# Patient Record
Sex: Female | Born: 1940 | Race: White | Hispanic: No | Marital: Married | Smoking: Former smoker
Health system: Southern US, Community
[De-identification: ages and names within clinical notes are randomized; demographics above are authoritative.]

## PROBLEM LIST (undated history)

## (undated) DIAGNOSIS — I10 Essential (primary) hypertension: Secondary | ICD-10-CM

## (undated) DIAGNOSIS — M1990 Unspecified osteoarthritis, unspecified site: Secondary | ICD-10-CM

## (undated) DIAGNOSIS — C801 Malignant (primary) neoplasm, unspecified: Secondary | ICD-10-CM

## (undated) DIAGNOSIS — K219 Gastro-esophageal reflux disease without esophagitis: Secondary | ICD-10-CM

## (undated) DIAGNOSIS — C3491 Malignant neoplasm of unspecified part of right bronchus or lung: Secondary | ICD-10-CM

## (undated) DIAGNOSIS — Z8719 Personal history of other diseases of the digestive system: Secondary | ICD-10-CM

## (undated) DIAGNOSIS — K589 Irritable bowel syndrome without diarrhea: Secondary | ICD-10-CM

## (undated) DIAGNOSIS — E0500 Thyrotoxicosis with diffuse goiter without thyrotoxic crisis or storm: Secondary | ICD-10-CM

## (undated) DIAGNOSIS — M542 Cervicalgia: Secondary | ICD-10-CM

## (undated) DIAGNOSIS — I340 Nonrheumatic mitral (valve) insufficiency: Secondary | ICD-10-CM

## (undated) DIAGNOSIS — I480 Paroxysmal atrial fibrillation: Secondary | ICD-10-CM

## (undated) DIAGNOSIS — R112 Nausea with vomiting, unspecified: Secondary | ICD-10-CM

## (undated) DIAGNOSIS — R7303 Prediabetes: Secondary | ICD-10-CM

## (undated) DIAGNOSIS — E785 Hyperlipidemia, unspecified: Secondary | ICD-10-CM

## (undated) DIAGNOSIS — Z803 Family history of malignant neoplasm of breast: Secondary | ICD-10-CM

## (undated) DIAGNOSIS — Z9889 Other specified postprocedural states: Secondary | ICD-10-CM

## (undated) DIAGNOSIS — F419 Anxiety disorder, unspecified: Secondary | ICD-10-CM

## (undated) HISTORY — PX: MASTECTOMY: SHX3

## (undated) HISTORY — PX: APPENDECTOMY: SHX54

## (undated) HISTORY — PX: BREAST SURGERY: SHX581

## (undated) HISTORY — PX: ABDOMINAL HYSTERECTOMY: SHX81

## (undated) HISTORY — PX: COLONOSCOPY: SHX174

## (undated) HISTORY — PX: BACK SURGERY: SHX140

## (undated) HISTORY — DX: Family history of malignant neoplasm of breast: Z80.3

## (undated) HISTORY — PX: TONSILLECTOMY: SUR1361

## (undated) HISTORY — PX: BREAST BIOPSY: SHX20

## (undated) HISTORY — PX: EYE SURGERY: SHX253

## (undated) HISTORY — PX: CHOLECYSTECTOMY: SHX55

---
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
## 1997-05-01 ENCOUNTER — Other Ambulatory Visit: Admission: RE | Admit: 1997-05-01 | Discharge: 1997-05-01 | Payer: Self-pay | Admitting: Obstetrics and Gynecology

## 1997-12-10 ENCOUNTER — Ambulatory Visit (HOSPITAL_COMMUNITY): Admission: RE | Admit: 1997-12-10 | Discharge: 1997-12-10 | Payer: Self-pay | Admitting: Family Medicine

## 1997-12-10 ENCOUNTER — Encounter: Payer: Self-pay | Admitting: Family Medicine

## 1997-12-16 ENCOUNTER — Ambulatory Visit (HOSPITAL_COMMUNITY): Admission: RE | Admit: 1997-12-16 | Discharge: 1997-12-16 | Payer: Self-pay | Admitting: Family Medicine

## 1998-01-24 DIAGNOSIS — C801 Malignant (primary) neoplasm, unspecified: Secondary | ICD-10-CM

## 1998-01-24 DIAGNOSIS — C50919 Malignant neoplasm of unspecified site of unspecified female breast: Secondary | ICD-10-CM

## 1998-01-24 HISTORY — DX: Malignant (primary) neoplasm, unspecified: C80.1

## 1998-01-24 HISTORY — DX: Malignant neoplasm of unspecified site of unspecified female breast: C50.919

## 1998-06-12 ENCOUNTER — Encounter: Payer: Self-pay | Admitting: Obstetrics and Gynecology

## 1998-06-12 ENCOUNTER — Ambulatory Visit (HOSPITAL_COMMUNITY): Admission: RE | Admit: 1998-06-12 | Discharge: 1998-06-12 | Payer: Self-pay | Admitting: Obstetrics and Gynecology

## 1998-06-29 ENCOUNTER — Encounter: Payer: Self-pay | Admitting: General Surgery

## 1998-06-29 ENCOUNTER — Ambulatory Visit (HOSPITAL_COMMUNITY): Admission: RE | Admit: 1998-06-29 | Discharge: 1998-06-29 | Payer: Self-pay | Admitting: *Deleted

## 1998-07-06 ENCOUNTER — Encounter: Payer: Self-pay | Admitting: General Surgery

## 1998-07-09 ENCOUNTER — Encounter: Payer: Self-pay | Admitting: General Surgery

## 1998-07-09 ENCOUNTER — Ambulatory Visit (HOSPITAL_COMMUNITY): Admission: RE | Admit: 1998-07-09 | Discharge: 1998-07-10 | Payer: Self-pay | Admitting: General Surgery

## 1998-07-30 ENCOUNTER — Inpatient Hospital Stay (HOSPITAL_COMMUNITY): Admission: RE | Admit: 1998-07-30 | Discharge: 1998-08-02 | Payer: Self-pay | Admitting: Plastic Surgery

## 1998-08-19 ENCOUNTER — Other Ambulatory Visit: Admission: RE | Admit: 1998-08-19 | Discharge: 1998-08-19 | Payer: Self-pay | Admitting: Obstetrics and Gynecology

## 1999-05-11 ENCOUNTER — Encounter (INDEPENDENT_AMBULATORY_CARE_PROVIDER_SITE_OTHER): Payer: Self-pay

## 1999-05-11 ENCOUNTER — Ambulatory Visit (HOSPITAL_COMMUNITY): Admission: RE | Admit: 1999-05-11 | Discharge: 1999-05-11 | Payer: Self-pay | Admitting: Gastroenterology

## 1999-06-25 ENCOUNTER — Encounter: Payer: Self-pay | Admitting: Hematology and Oncology

## 1999-06-25 ENCOUNTER — Encounter: Admission: RE | Admit: 1999-06-25 | Discharge: 1999-06-25 | Payer: Self-pay | Admitting: Hematology and Oncology

## 1999-07-06 ENCOUNTER — Encounter: Admission: RE | Admit: 1999-07-06 | Discharge: 1999-07-06 | Payer: Self-pay | Admitting: Hematology and Oncology

## 1999-07-06 ENCOUNTER — Encounter: Payer: Self-pay | Admitting: Hematology and Oncology

## 1999-08-11 ENCOUNTER — Encounter: Admission: RE | Admit: 1999-08-11 | Discharge: 1999-08-11 | Payer: Self-pay | Admitting: Family Medicine

## 2000-07-04 ENCOUNTER — Encounter: Payer: Self-pay | Admitting: Hematology and Oncology

## 2000-07-04 ENCOUNTER — Ambulatory Visit (HOSPITAL_COMMUNITY): Admission: RE | Admit: 2000-07-04 | Discharge: 2000-07-04 | Payer: Self-pay | Admitting: Hematology and Oncology

## 2000-07-06 ENCOUNTER — Ambulatory Visit (HOSPITAL_COMMUNITY): Admission: RE | Admit: 2000-07-06 | Discharge: 2000-07-06 | Payer: Self-pay | Admitting: Hematology and Oncology

## 2000-07-06 ENCOUNTER — Encounter: Payer: Self-pay | Admitting: Hematology and Oncology

## 2000-12-18 ENCOUNTER — Encounter: Payer: Self-pay | Admitting: Family Medicine

## 2000-12-18 ENCOUNTER — Encounter: Admission: RE | Admit: 2000-12-18 | Discharge: 2000-12-18 | Payer: Self-pay | Admitting: Family Medicine

## 2001-07-10 ENCOUNTER — Encounter: Payer: Self-pay | Admitting: Hematology and Oncology

## 2001-07-10 ENCOUNTER — Encounter: Admission: RE | Admit: 2001-07-10 | Discharge: 2001-07-10 | Payer: Self-pay | Admitting: Hematology and Oncology

## 2002-07-15 ENCOUNTER — Encounter: Admission: RE | Admit: 2002-07-15 | Discharge: 2002-07-15 | Payer: Self-pay | Admitting: Hematology & Oncology

## 2002-07-15 ENCOUNTER — Encounter: Payer: Self-pay | Admitting: Hematology & Oncology

## 2002-08-08 ENCOUNTER — Ambulatory Visit (HOSPITAL_BASED_OUTPATIENT_CLINIC_OR_DEPARTMENT_OTHER): Admission: RE | Admit: 2002-08-08 | Discharge: 2002-08-09 | Payer: Self-pay | Admitting: Orthopedic Surgery

## 2003-03-19 ENCOUNTER — Other Ambulatory Visit: Admission: RE | Admit: 2003-03-19 | Discharge: 2003-03-19 | Payer: Self-pay | Admitting: *Deleted

## 2003-07-16 ENCOUNTER — Encounter: Admission: RE | Admit: 2003-07-16 | Discharge: 2003-07-16 | Payer: Self-pay | Admitting: Hematology & Oncology

## 2003-09-25 ENCOUNTER — Other Ambulatory Visit: Admission: RE | Admit: 2003-09-25 | Discharge: 2003-09-25 | Payer: Self-pay | Admitting: *Deleted

## 2003-11-04 ENCOUNTER — Encounter: Admission: RE | Admit: 2003-11-04 | Discharge: 2003-11-04 | Payer: Self-pay | Admitting: Family Medicine

## 2004-06-25 ENCOUNTER — Ambulatory Visit: Payer: Self-pay | Admitting: Hematology & Oncology

## 2004-07-21 ENCOUNTER — Encounter: Admission: RE | Admit: 2004-07-21 | Discharge: 2004-07-21 | Payer: Self-pay | Admitting: Hematology & Oncology

## 2004-10-12 ENCOUNTER — Other Ambulatory Visit: Admission: RE | Admit: 2004-10-12 | Discharge: 2004-10-12 | Payer: Self-pay | Admitting: *Deleted

## 2005-05-25 ENCOUNTER — Encounter: Payer: Self-pay | Admitting: Family Medicine

## 2005-07-19 ENCOUNTER — Ambulatory Visit: Payer: Self-pay | Admitting: Hematology & Oncology

## 2005-08-22 ENCOUNTER — Encounter: Admission: RE | Admit: 2005-08-22 | Discharge: 2005-08-22 | Payer: Self-pay | Admitting: Hematology & Oncology

## 2005-11-11 ENCOUNTER — Other Ambulatory Visit: Admission: RE | Admit: 2005-11-11 | Discharge: 2005-11-11 | Payer: Self-pay | Admitting: Obstetrics and Gynecology

## 2005-12-26 ENCOUNTER — Ambulatory Visit: Payer: Self-pay | Admitting: Hematology & Oncology

## 2006-02-28 ENCOUNTER — Ambulatory Visit: Payer: Self-pay | Admitting: Hematology & Oncology

## 2006-07-07 ENCOUNTER — Other Ambulatory Visit: Admission: RE | Admit: 2006-07-07 | Discharge: 2006-07-07 | Payer: Self-pay | Admitting: Obstetrics and Gynecology

## 2006-08-24 ENCOUNTER — Encounter: Admission: RE | Admit: 2006-08-24 | Discharge: 2006-08-24 | Payer: Self-pay | Admitting: Obstetrics and Gynecology

## 2007-01-17 ENCOUNTER — Other Ambulatory Visit: Admission: RE | Admit: 2007-01-17 | Discharge: 2007-01-17 | Payer: Self-pay | Admitting: Obstetrics and Gynecology

## 2007-07-13 ENCOUNTER — Other Ambulatory Visit: Admission: RE | Admit: 2007-07-13 | Discharge: 2007-07-13 | Payer: Self-pay | Admitting: Obstetrics and Gynecology

## 2007-08-27 ENCOUNTER — Encounter: Admission: RE | Admit: 2007-08-27 | Discharge: 2007-08-27 | Payer: Self-pay | Admitting: Obstetrics and Gynecology

## 2008-02-01 ENCOUNTER — Other Ambulatory Visit: Admission: RE | Admit: 2008-02-01 | Discharge: 2008-02-01 | Payer: Self-pay | Admitting: Obstetrics and Gynecology

## 2008-08-27 ENCOUNTER — Encounter: Admission: RE | Admit: 2008-08-27 | Discharge: 2008-08-27 | Payer: Self-pay | Admitting: Obstetrics and Gynecology

## 2008-08-29 ENCOUNTER — Encounter: Admission: RE | Admit: 2008-08-29 | Discharge: 2008-08-29 | Payer: Self-pay | Admitting: Obstetrics and Gynecology

## 2008-12-08 ENCOUNTER — Encounter: Admission: RE | Admit: 2008-12-08 | Discharge: 2008-12-08 | Payer: Self-pay | Admitting: Family Medicine

## 2009-02-24 ENCOUNTER — Encounter: Admission: RE | Admit: 2009-02-24 | Discharge: 2009-02-24 | Payer: Self-pay | Admitting: Obstetrics and Gynecology

## 2009-08-05 ENCOUNTER — Encounter: Admission: RE | Admit: 2009-08-05 | Discharge: 2009-08-05 | Payer: Self-pay | Admitting: Gastroenterology

## 2010-02-13 ENCOUNTER — Other Ambulatory Visit: Payer: Self-pay | Admitting: Family Medicine

## 2010-02-13 DIAGNOSIS — Z0000 Encounter for general adult medical examination without abnormal findings: Secondary | ICD-10-CM

## 2010-02-25 ENCOUNTER — Ambulatory Visit
Admission: RE | Admit: 2010-02-25 | Discharge: 2010-02-25 | Disposition: A | Discharge status: Home / Self Care | Payer: BC Managed Care – PPO | Source: Ambulatory Visit | Attending: Family Medicine | Admitting: Family Medicine

## 2010-02-25 DIAGNOSIS — Z0000 Encounter for general adult medical examination without abnormal findings: Secondary | ICD-10-CM

## 2010-06-11 NOTE — Op Note (Signed)
NAME:  Vanessa Day, Vanessa Day                         ACCOUNT NO.:  [id number]   MEDICAL RECORD NO.:  [id number]                   PATIENT TYPE:  AMB   LOCATION:  DSC                                  FACILITY:  [facility]   PHYSICIAN:  Cindee Salt, M.D.                    DATE OF BIRTH:  [birth year]   DATE OF PROCEDURE:  08/08/2002  DATE OF DISCHARGE:                                 OPERATIVE REPORT   PREOPERATIVE DIAGNOSIS:  Carpal tunnel syndrome, arthritis CMC joint, right  hand, thumb.   POSTOPERATIVE DIAGNOSIS:  Carpal tunnel syndrome, arthritis CMC, right hand,  thumb.   OPERATION:  Carpal tunnel release, suspension plasty with trapezium  excision, right thumb, right hand.   SURGEON:  Cindee Salt, M.D.   ASSISTANTCarolyne Fiscal, nurse specialist.   ANESTHESIA:  Axillary block.   HISTORY:  The patient is a 70 year old female with a history of carpal  tunnel syndrome. EMG nerve conduction positive. 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   PROCEDURE:  The patient was brought to the operating room where an axillary  block was carried out without difficulty. She was prepped and draped using  Betadine scrub and solution with the right arm free. Supine position. A  longitudinal incision was made in the palm and carried through subcutaneous.  Bleeders were electrocauterized. Palmar fascia was splint. Superficial  palmar arch identified. The flexor tendon to the ring and little finger  identified to the ulnar side of the medial nerve. The carpal retinaculum was  incised with sharp dissection, right angle and retractor placed between skin  and forearm fascia. The fascia was released for approximately 3-cm proximal  to the wrist crease under direct vision. Canal was explored. No further  lesions were identified. The wound was irrigated. The skin was closed with  interrupted 5-0 nylon sutures. Hockey-stick incision was made over the  carpal metacarpal joint with a volar  apex, carried down through subcutaneous  tissue. Bleeders were again electrocauterized. Radial nerve was identified  and protected along with its branches. Radial artery was identified, and  incision was then made along the trapezium, opening the [facility] and ________  joint. The arthritis was immediately apparent with total eburnation of bone.  Blunt sharp dissection, the trapezium was excised, morselized in piecemeal,  and removed in toto. The dorsal limb of the abductor pollicis longus was  then harvested using a 30-gauge stainless steel monofilament wire. Separate  incision was made proximally. A Bunnell tendon retriever passed through the  first dorsal compartment.  With achieved cutter effect, the dorsal tendon  was harvested. This was left attached at the base of the metacarpal of the  thumb. Drill holes were placed in the base of the thumb and index finger.  The tendon was then woven through the drill holes from the base of the thumb  through the drill hole  in the base of the index, brought dorsally, then  palmarly around the bone, and passed volar to the slip from the first and  second metacarpal, brought back onto itself and sutured into position with 4-  0 Mersilene sutures and 4-0 Mersilene into the graft as it emerged from the  base of the thumb metacarpal. This firmly fixed the thumb suspended from the  index metacarpal with the drill hole just distal the articular surface for  the thumb CMC on the index metacarpal. The wound was copiously irrigated  with saline, the capsule closed with figure-of-eight 4-0 Vicryl sutures,  subcutaneous tissue with  4-0 Vicryl, and the skin with interrupted 5-0 nylon sutures. Sterile  compressive dressing and thumb spica splint applied. The patient tolerated  the procedure well and was taken to the recovery room for observation in  satisfactory condition. She is admitted for overnight stay. She will be  discharged on Percocet and Keflex.                                                Cindee Salt, M.D.    GK/MEDQ  D:  08/08/2002  T:  08/08/2002  Job:  [id number]

## 2010-07-15 ENCOUNTER — Other Ambulatory Visit: Payer: Self-pay | Admitting: Neurosurgery

## 2010-07-15 DIAGNOSIS — M549 Dorsalgia, unspecified: Secondary | ICD-10-CM

## 2010-07-15 DIAGNOSIS — M5410 Radiculopathy, site unspecified: Secondary | ICD-10-CM

## 2010-07-17 ENCOUNTER — Ambulatory Visit
Admission: RE | Admit: 2010-07-17 | Discharge: 2010-07-17 | Disposition: A | Discharge status: Home / Self Care | Payer: BC Managed Care – PPO | Source: Ambulatory Visit | Attending: Neurosurgery | Admitting: Neurosurgery

## 2010-07-17 DIAGNOSIS — M549 Dorsalgia, unspecified: Secondary | ICD-10-CM

## 2010-07-17 DIAGNOSIS — M5410 Radiculopathy, site unspecified: Secondary | ICD-10-CM

## 2011-01-26 ENCOUNTER — Other Ambulatory Visit: Payer: Self-pay | Admitting: Obstetrics and Gynecology

## 2011-01-26 DIAGNOSIS — Z1231 Encounter for screening mammogram for malignant neoplasm of breast: Secondary | ICD-10-CM

## 2011-03-08 ENCOUNTER — Ambulatory Visit
Admission: RE | Admit: 2011-03-08 | Discharge: 2011-03-08 | Disposition: A | Discharge status: Home / Self Care | Payer: Medicare Other | Source: Ambulatory Visit | Attending: Obstetrics and Gynecology | Admitting: Obstetrics and Gynecology

## 2011-03-08 DIAGNOSIS — Z1231 Encounter for screening mammogram for malignant neoplasm of breast: Secondary | ICD-10-CM

## 2011-06-21 ENCOUNTER — Encounter (HOSPITAL_COMMUNITY): Payer: Self-pay | Admitting: Pharmacy Technician

## 2011-06-22 ENCOUNTER — Encounter (HOSPITAL_COMMUNITY): Payer: Self-pay | Admitting: *Deleted

## 2011-06-22 ENCOUNTER — Encounter (INDEPENDENT_AMBULATORY_CARE_PROVIDER_SITE_OTHER): Payer: Medicare Other | Admitting: Ophthalmology

## 2011-06-22 DIAGNOSIS — H33009 Unspecified retinal detachment with retinal break, unspecified eye: Secondary | ICD-10-CM

## 2011-06-22 DIAGNOSIS — H43819 Vitreous degeneration, unspecified eye: Secondary | ICD-10-CM

## 2011-06-22 MED ORDER — GATIFLOXACIN 0.5 % OP SOLN
1.0000 [drp] | OPHTHALMIC | Status: AC | PRN
  Administered 2011-06-23 (×3): 1 [drp] via OPHTHALMIC
  Filled 2011-06-22: qty 2.5

## 2011-06-22 MED ORDER — CEFAZOLIN SODIUM 1-5 GM-% IV SOLN
1.0000 g | INTRAVENOUS | Status: AC
  Administered 2011-06-23: 1 g via INTRAVENOUS
  Filled 2011-06-22: qty 50

## 2011-06-22 MED ORDER — PHENYLEPHRINE HCL 2.5 % OP SOLN
1.0000 [drp] | OPHTHALMIC | Status: AC | PRN
  Administered 2011-06-23 (×3): 1 [drp] via OPHTHALMIC
  Filled 2011-06-22: qty 3

## 2011-06-22 MED ORDER — CYCLOPENTOLATE HCL 1 % OP SOLN
1.0000 [drp] | OPHTHALMIC | Status: AC | PRN
  Administered 2011-06-23 (×3): 1 [drp] via OPHTHALMIC
  Filled 2011-06-22: qty 2

## 2011-06-22 MED ORDER — TROPICAMIDE 1 % OP SOLN
1.0000 [drp] | OPHTHALMIC | Status: AC | PRN
  Administered 2011-06-23 (×3): 1 [drp] via OPHTHALMIC
  Filled 2011-06-22: qty 3

## 2011-06-22 NOTE — H&P (Signed)
Vanessa Day is an 71 y.o. female.   Chief Complaint: sudden loss of vision left eye HPI: cataract surgery ten years ago.  Awoke with loss of vision left eye  No past medical history on file.  No past surgical history on file.  No family history on file. Social History:  does not have a smoking history on file. She does not have any smokeless tobacco history on file. Her alcohol and drug histories not on file.  Allergies: No Known Allergies   (Not in a hospital admission)  Review of systems otherwise negative  There were no vitals taken for this visit.  Physical exam: Mental status: oriented x3. Eyes: See eye exam associated with this date of surgery in media tab.  Scanned in by scanning center Ears, Nose, Throat: within normal limits Neck: Within Normal limits General: within normal limits Chest: Within normal limits Breast: deferred Heart: Within normal limits Abdomen: Within normal limits GU: deferred Extremities: within normal limits Skin: within normal limits  Assessment/Plan Rhegmatogenous retinal detachment left eye Plan: To [facility] for Scleral buckle repair of Rhegmatogenous retinal detachment left eye with possible laser treatment and gas injection  Oleva Koo, Beulah Gandy 06/22/2011, 8:47 AM

## 2011-06-23 ENCOUNTER — Encounter (HOSPITAL_COMMUNITY): Payer: Self-pay | Admitting: *Deleted

## 2011-06-23 ENCOUNTER — Encounter (HOSPITAL_COMMUNITY)
Admission: RE | Disposition: A | Discharge status: Home / Self Care | Payer: Self-pay | Source: Ambulatory Visit | Attending: Ophthalmology

## 2011-06-23 ENCOUNTER — Ambulatory Visit (HOSPITAL_COMMUNITY): Payer: Medicare Other

## 2011-06-23 ENCOUNTER — Ambulatory Visit (HOSPITAL_COMMUNITY)
Admission: RE | Admit: 2011-06-23 | Discharge: 2011-06-24 | Disposition: A | Discharge status: Home / Self Care | Payer: Medicare Other | Source: Ambulatory Visit | Attending: Ophthalmology | Admitting: Ophthalmology

## 2011-06-23 ENCOUNTER — Encounter (INDEPENDENT_AMBULATORY_CARE_PROVIDER_SITE_OTHER): Payer: Self-pay | Admitting: Ophthalmology

## 2011-06-23 ENCOUNTER — Encounter (HOSPITAL_COMMUNITY): Payer: Self-pay | Admitting: Anesthesiology

## 2011-06-23 ENCOUNTER — Ambulatory Visit (HOSPITAL_COMMUNITY): Payer: Medicare Other | Admitting: Anesthesiology

## 2011-06-23 DIAGNOSIS — I10 Essential (primary) hypertension: Secondary | ICD-10-CM | POA: Insufficient documentation

## 2011-06-23 DIAGNOSIS — H33009 Unspecified retinal detachment with retinal break, unspecified eye: Secondary | ICD-10-CM

## 2011-06-23 DIAGNOSIS — K219 Gastro-esophageal reflux disease without esophagitis: Secondary | ICD-10-CM | POA: Insufficient documentation

## 2011-06-23 DIAGNOSIS — E119 Type 2 diabetes mellitus without complications: Secondary | ICD-10-CM | POA: Insufficient documentation

## 2011-06-23 HISTORY — DX: Unspecified osteoarthritis, unspecified site: M19.90

## 2011-06-23 HISTORY — PX: GAS INSERTION: SHX5336

## 2011-06-23 HISTORY — DX: Gastro-esophageal reflux disease without esophagitis: K21.9

## 2011-06-23 HISTORY — DX: Essential (primary) hypertension: I10

## 2011-06-23 HISTORY — PX: SCLERAL BUCKLE: SHX5340

## 2011-06-23 HISTORY — DX: Other specified postprocedural states: Z98.890

## 2011-06-23 HISTORY — DX: Personal history of other diseases of the digestive system: Z87.19

## 2011-06-23 HISTORY — DX: Nausea with vomiting, unspecified: R11.2

## 2011-06-23 LAB — CBC
HCT: 42.2 % (ref 36.0–46.0)
MCHC: 33.9 g/dL (ref 30.0–36.0)
MCV: 93 fL (ref 78.0–100.0)
Platelets: 281 10*3/uL (ref 150–400)
RBC: 4.54 MIL/uL (ref 3.87–5.11)
RDW: 13.2 % (ref 11.5–15.5)
WBC: 8.5 10*3/uL (ref 4.0–10.5)

## 2011-06-23 LAB — BASIC METABOLIC PANEL
CO2: 30 mEq/L (ref 19–32)
Chloride: 103 mEq/L (ref 96–112)
Creatinine, Ser: 0.88 mg/dL (ref 0.50–1.10)
Glucose, Bld: 115 mg/dL — ABNORMAL HIGH (ref 70–99)
Sodium: 141 mEq/L (ref 135–145)

## 2011-06-23 LAB — GLUCOSE, CAPILLARY
Glucose-Capillary: 106 mg/dL — ABNORMAL HIGH (ref 70–99)
Glucose-Capillary: 160 mg/dL — ABNORMAL HIGH (ref 70–99)

## 2011-06-23 LAB — SURGICAL PCR SCREEN
MRSA, PCR: NEGATIVE
Staphylococcus aureus: NEGATIVE

## 2011-06-23 SURGERY — SCLERAL BUCKLE
Anesthesia: General | Site: Eye | Laterality: Left | Wound class: Clean

## 2011-06-23 MED ORDER — HYDROMORPHONE HCL PF 1 MG/ML IJ SOLN: 0.2500 mg | INTRAMUSCULAR | Status: DC | PRN

## 2011-06-23 MED ORDER — BUPIVACAINE HCL 0.75 % IJ SOLN
INTRAMUSCULAR | Status: DC | PRN
  Administered 2011-06-23: 10 mL

## 2011-06-23 MED ORDER — PROPOFOL 10 MG/ML IV EMUL
INTRAVENOUS | Status: DC | PRN
  Administered 2011-06-23: 150 mg via INTRAVENOUS

## 2011-06-23 MED ORDER — PREDNISOLONE ACETATE 1 % OP SUSP
1.0000 [drp] | Freq: Four times a day (QID) | OPHTHALMIC | Status: DC
  Filled 2011-06-23: qty 1

## 2011-06-23 MED ORDER — ONDANSETRON HCL 4 MG/2ML IJ SOLN
INTRAMUSCULAR | Status: DC | PRN
  Administered 2011-06-23: 4 mg via INTRAVENOUS

## 2011-06-23 MED ORDER — ONDANSETRON HCL 4 MG/2ML IJ SOLN: 4.0000 mg | Freq: Four times a day (QID) | INTRAMUSCULAR | Status: DC | PRN

## 2011-06-23 MED ORDER — OXYCODONE-ACETAMINOPHEN 5-325 MG PO TABS
1.0000 | ORAL_TABLET | ORAL | Status: DC | PRN
  Administered 2011-06-23 – 2011-06-24 (×2): 1 via ORAL
  Filled 2011-06-23: qty 1
  Filled 2011-06-23: qty 2

## 2011-06-23 MED ORDER — ACETAZOLAMIDE SODIUM 500 MG IJ SOLR
500.0000 mg | Freq: Once | INTRAMUSCULAR | Status: AC
  Administered 2011-06-24: 500 mg via INTRAVENOUS
  Filled 2011-06-23: qty 500

## 2011-06-23 MED ORDER — TEMAZEPAM 15 MG PO CAPS
15.0000 mg | ORAL_CAPSULE | Freq: Every evening | ORAL | Status: DC | PRN
  Administered 2011-06-23: 15 mg via ORAL
  Filled 2011-06-23: qty 1

## 2011-06-23 MED ORDER — SODIUM CHLORIDE 0.9 % IJ SOLN
INTRAMUSCULAR | Status: DC | PRN
  Administered 2011-06-23: 15:00:00

## 2011-06-23 MED ORDER — MAGNESIUM HYDROXIDE 400 MG/5ML PO SUSP: 15.0000 mL | Freq: Four times a day (QID) | ORAL | Status: DC | PRN

## 2011-06-23 MED ORDER — SODIUM CHLORIDE 0.45 % IV SOLN
INTRAVENOUS | Status: DC
  Administered 2011-06-23: 22:00:00 via INTRAVENOUS

## 2011-06-23 MED ORDER — GLYCOPYRROLATE 0.2 MG/ML IJ SOLN
INTRAMUSCULAR | Status: DC | PRN
  Administered 2011-06-23: .7 mg via INTRAVENOUS

## 2011-06-23 MED ORDER — GATIFLOXACIN 0.5 % OP SOLN
1.0000 [drp] | Freq: Four times a day (QID) | OPHTHALMIC | Status: DC
  Filled 2011-06-23: qty 2.5

## 2011-06-23 MED ORDER — SODIUM CHLORIDE 0.9 % IV SOLN
INTRAVENOUS | Status: DC
  Administered 2011-06-23: 14:00:00 via INTRAVENOUS

## 2011-06-23 MED ORDER — BACITRACIN-POLYMYXIN B 500-10000 UNIT/GM OP OINT
TOPICAL_OINTMENT | OPHTHALMIC | Status: DC | PRN
  Administered 2011-06-23: 1 via OPHTHALMIC

## 2011-06-23 MED ORDER — METOCLOPRAMIDE HCL 5 MG/ML IJ SOLN
10.0000 mg | Freq: Once | INTRAMUSCULAR | Status: AC | PRN
  Administered 2011-06-23: 10 mg via INTRAVENOUS
  Filled 2011-06-23: qty 2

## 2011-06-23 MED ORDER — LATANOPROST 0.005 % OP SOLN
1.0000 [drp] | Freq: Every day | OPHTHALMIC | Status: DC
  Filled 2011-06-23: qty 2.5

## 2011-06-23 MED ORDER — DEXTROSE 5 % IV SOLN
INTRAVENOUS | Status: DC | PRN
  Administered 2011-06-23: 15:00:00 via INTRAVENOUS

## 2011-06-23 MED ORDER — MORPHINE SULFATE 2 MG/ML IJ SOLN
1.0000 mg | INTRAMUSCULAR | Status: DC | PRN
  Administered 2011-06-23: 2 mg via INTRAVENOUS
  Filled 2011-06-23 (×2): qty 1

## 2011-06-23 MED ORDER — TETRACAINE HCL 0.5 % OP SOLN
2.0000 [drp] | Freq: Once | OPHTHALMIC | Status: DC
  Filled 2011-06-23: qty 2

## 2011-06-23 MED ORDER — BRIMONIDINE TARTRATE 0.2 % OP SOLN
1.0000 [drp] | Freq: Two times a day (BID) | OPHTHALMIC | Status: DC
  Filled 2011-06-23: qty 5

## 2011-06-23 MED ORDER — NEOSTIGMINE METHYLSULFATE 1 MG/ML IJ SOLN
INTRAMUSCULAR | Status: DC | PRN
  Administered 2011-06-23: 5 mg via INTRAVENOUS

## 2011-06-23 MED ORDER — DEXAMETHASONE SODIUM PHOSPHATE 10 MG/ML IJ SOLN
INTRAMUSCULAR | Status: DC | PRN
  Administered 2011-06-23: 10 mg

## 2011-06-23 MED ORDER — FENTANYL CITRATE 0.05 MG/ML IJ SOLN
INTRAMUSCULAR | Status: DC | PRN
  Administered 2011-06-23: 100 ug via INTRAVENOUS
  Administered 2011-06-23: 25 ug via INTRAVENOUS
  Administered 2011-06-23 (×3): 50 ug via INTRAVENOUS

## 2011-06-23 MED ORDER — BACITRACIN-POLYMYXIN B 500-10000 UNIT/GM OP OINT
1.0000 "application " | TOPICAL_OINTMENT | Freq: Four times a day (QID) | OPHTHALMIC | Status: DC
  Filled 2011-06-23: qty 3.5

## 2011-06-23 MED ORDER — MIDAZOLAM HCL 5 MG/5ML IJ SOLN
INTRAMUSCULAR | Status: DC | PRN
  Administered 2011-06-23: 2 mg via INTRAVENOUS

## 2011-06-23 MED ORDER — ACETAMINOPHEN 325 MG PO TABS: 325.0000 mg | ORAL_TABLET | ORAL | Status: DC | PRN

## 2011-06-23 MED ORDER — ROCURONIUM BROMIDE 100 MG/10ML IV SOLN
INTRAVENOUS | Status: DC | PRN
  Administered 2011-06-23: 50 mg via INTRAVENOUS
  Administered 2011-06-23: 10 mg via INTRAVENOUS
  Administered 2011-06-23: 5 mg via INTRAVENOUS

## 2011-06-23 MED ORDER — MUPIROCIN 2 % EX OINT
TOPICAL_OINTMENT | Freq: Once | CUTANEOUS | Status: DC
  Filled 2011-06-23: qty 22

## 2011-06-23 MED ORDER — SODIUM CHLORIDE 0.9 % IV SOLN
INTRAVENOUS | Status: DC | PRN
  Administered 2011-06-23 (×2): via INTRAVENOUS

## 2011-06-23 MED ORDER — HEMOSTATIC AGENTS (NO CHARGE) OPTIME
TOPICAL | Status: DC | PRN
  Administered 2011-06-23: 1 via TOPICAL

## 2011-06-23 MED ORDER — MUPIROCIN 2 % EX OINT
TOPICAL_OINTMENT | CUTANEOUS | Status: AC
  Filled 2011-06-23: qty 22

## 2011-06-23 SURGICAL SUPPLY — 66 items
APPLICATOR DR MATTHEWS STRL (MISCELLANEOUS) ×22 IMPLANT
BAND SCLERAL BUCKLING TYPE 240 (Ophthalmic Related) ×2 IMPLANT
BLADE EYE CATARACT 19 1.4 BEAV (BLADE) ×2 IMPLANT
BLADE MVR KNIFE 19G (BLADE) IMPLANT
BLADE SURG 15 STRL LF DISP TIS (BLADE) IMPLANT
BLADE SURG 15 STRL SS (BLADE)
CANNULA ANT CHAM MAIN (OPHTHALMIC RELATED) IMPLANT
CANNULA DUAL BORE 23G (CANNULA) IMPLANT
CORDS BIPOLAR (ELECTRODE) IMPLANT
COTTONBALL LRG STERILE PKG (GAUZE/BANDAGES/DRESSINGS) ×6 IMPLANT
COVER SURGICAL LIGHT HANDLE (MISCELLANEOUS) ×2 IMPLANT
DRAPE OPHTHALMIC 77X100 STRL (CUSTOM PROCEDURE TRAY) ×2 IMPLANT
ERASER HMR WETFIELD 23G BP (MISCELLANEOUS) IMPLANT
FILTER BLUE MILLIPORE (MISCELLANEOUS) ×4 IMPLANT
FILTER STRAW FLUID ASPIR (MISCELLANEOUS) IMPLANT
GAS OPHTHALMIC (MISCELLANEOUS) ×2 IMPLANT
GLOVE SS BIOGEL STRL SZ 6.5 (GLOVE) ×2 IMPLANT
GLOVE SS BIOGEL STRL SZ 7 (GLOVE) ×1 IMPLANT
GLOVE SUPERSENSE BIOGEL SZ 6.5 (GLOVE) ×2
GLOVE SUPERSENSE BIOGEL SZ 7 (GLOVE) ×1
GLOVE SURG 8.5 LATEX PF (GLOVE) ×4 IMPLANT
GLOVE SURG SS PI 6.5 STRL IVOR (GLOVE) ×2 IMPLANT
GOWN STRL NON-REIN LRG LVL3 (GOWN DISPOSABLE) IMPLANT
ILLUMINATOR CHOW PICK 25GA (MISCELLANEOUS) IMPLANT
KIT BASIN OR (CUSTOM PROCEDURE TRAY) ×2 IMPLANT
KIT PERFLUORON PROCEDURE 5ML (MISCELLANEOUS) IMPLANT
KIT ROOM TURNOVER OR (KITS) ×2 IMPLANT
KNIFE CRESCENT 1.75 EDGEAHEAD (BLADE) IMPLANT
KNIFE GRIESHABER SHARP 2.5MM (MISCELLANEOUS) ×8 IMPLANT
MARKER SKIN DUAL TIP RULER LAB (MISCELLANEOUS) IMPLANT
NEEDLE 18GX1X1/2 (RX/OR ONLY) (NEEDLE) ×2 IMPLANT
NEEDLE 25GX 5/8IN NON SAFETY (NEEDLE) IMPLANT
NEEDLE 27GAX1X1/2 (NEEDLE) IMPLANT
NEEDLE HYPO 30X.5 LL (NEEDLE) ×6 IMPLANT
NS IRRIG 1000ML POUR BTL (IV SOLUTION) ×2 IMPLANT
PACK VITRECTOMY CUSTOM (CUSTOM PROCEDURE TRAY) ×2 IMPLANT
PAD ARMBOARD 7.5X6 YLW CONV (MISCELLANEOUS) ×4 IMPLANT
PAD EYE OVAL STERILE LF (GAUZE/BANDAGES/DRESSINGS) IMPLANT
PAK VITRECTOMY PIK 25 GA (OPHTHALMIC RELATED) IMPLANT
PROBE DIRECTIONAL LASER (MISCELLANEOUS) IMPLANT
REPL STRA BRUSH NEEDLE (NEEDLE) IMPLANT
RESERVOIR BACK FLUSH (MISCELLANEOUS) IMPLANT
ROLLS DENTAL (MISCELLANEOUS) ×4 IMPLANT
SET FLUID INJECTOR (SET/KITS/TRAYS/PACK) IMPLANT
SET VGFI TUBING 8065808002 (SET/KITS/TRAYS/PACK) IMPLANT
SPONGE GROOVED SILICONE 4X12X8 (Ophthalmic Related) ×2 IMPLANT
SPONGE SURGIFOAM ABS GEL 12-7 (HEMOSTASIS) ×2 IMPLANT
STOPCOCK 4 WAY LG BORE MALE ST (IV SETS) IMPLANT
SUT CHROMIC 7 0 TG140 8 (SUTURE) ×2 IMPLANT
SUT ETHILON 9 0 TG140 8 (SUTURE) IMPLANT
SUT MERSILENE 4 0 RV 2 (SUTURE) ×4 IMPLANT
SUT SILK 2 0 (SUTURE) ×1
SUT SILK 2-0 18XBRD TIE 12 (SUTURE) ×1 IMPLANT
SUT SILK 4 0 RB 1 (SUTURE) ×2 IMPLANT
SUT VICRYL 7 0 TG140 8 (SUTURE) IMPLANT
SYR 20CC LL (SYRINGE) ×2 IMPLANT
SYR 5ML LL (SYRINGE) IMPLANT
SYR BULB 3OZ (MISCELLANEOUS) ×2 IMPLANT
SYR TB 1ML LUER SLIP (SYRINGE) IMPLANT
SYRINGE 10CC LL (SYRINGE) ×2 IMPLANT
TIRE 11 SCLERAL TYPE 279 (Ophthalmic Related) ×2 IMPLANT
TOWEL OR 17X24 6PK STRL BLUE (TOWEL DISPOSABLE) ×6 IMPLANT
TUBING ART PRESS 12 MALE/MALE (MISCELLANEOUS) IMPLANT
VITREORETINAL VISCODISSEC (MISCELLANEOUS) IMPLANT
WATER STERILE IRR 1000ML POUR (IV SOLUTION) ×2 IMPLANT
WIPE INSTRUMENT VISIWIPE 73X73 (MISCELLANEOUS) ×2 IMPLANT

## 2011-06-23 NOTE — Progress Notes (Signed)
Patient states she had EKG done at Dr. Lupita Raider office- office to fax to [place].

## 2011-06-23 NOTE — Procedures (Signed)
Brief Operative note   Preoperative diagnosis:  Pre-Op Diagnosis Codes:    * Retinal detachment with retinal defect, unspecified [361.00] Postoperative diagnosis  Post-Op Diagnosis Codes:    * Retinal detachment with retinal defect, unspecified [361.00]  Procedures: Scleral buckle, laser and gas injection  Surgeon:  Sherrie George, MD  Assistant:  Rosalie Doctor SA    Anesthesia: General  Specimen: none  Estimated blood loss:  1cc  Complications: none  Patient sent to PACU in good condition  Composed by Sherrie George MD  Dictation number: [id number]

## 2011-06-23 NOTE — Anesthesia Procedure Notes (Signed)
Procedure Name: Intubation Date/Time: 06/23/2011 2:58 PM Performed by: Keslie Gritz S Pre-anesthesia Checklist: Patient identified, Emergency Drugs available, Suction available, Patient being monitored and Timeout performed Patient Re-evaluated:Patient Re-evaluated prior to inductionOxygen Delivery Method: Circle system utilized Preoxygenation: Pre-oxygenation with 100% oxygen Intubation Type: IV induction Ventilation: Mask ventilation without difficulty Laryngoscope Size: Mac and 3 Grade View: Grade I Tube type: Oral Tube size: 7.5 mm Number of attempts: 1 Airway Equipment and Method: Stylet Placement Confirmation: ETT inserted through vocal cords under direct vision,  positive ETCO2 and breath sounds checked- equal and bilateral Secured at: 22 cm Tube secured with: Tape Dental Injury: Teeth and Oropharynx as per pre-operative assessment

## 2011-06-23 NOTE — Progress Notes (Signed)
This encounter was created in error - please disregard.

## 2011-06-23 NOTE — H&P (Signed)
I examined the patient today and there is no change in the medical status 

## 2011-06-23 NOTE — Addendum Note (Signed)
Addendum  created 06/23/11 1818 by Judie Petit, MD   Modules edited:Orders

## 2011-06-23 NOTE — Op Note (Signed)
NAMEJAYDI, BRAY               ACCOUNT NO.:  [id number]  MEDICAL RECORD NO.:  [id number]  LOCATION:  5148                         FACILITY:  [facility]  PHYSICIAN:  Beulah Gandy. Ashley Royalty, M.D. DATE OF BIRTH:  12/08/1940  DATE OF PROCEDURE:  06/23/2011 DATE OF DISCHARGE:                              OPERATIVE REPORT   ADMISSION DIAGNOSIS:  Rhegmatogenous retinal detachment in the left eye.  PROCEDURES: 1. Scleral buckle, left eye. 2. Retinal photocoagulation, left eye. 3. Gas injection, left eye.  SURGEON:  Beulah Gandy. Ashley Royalty, MD  ASSISTANT:  Rosalie Doctor, SA  ANESTHESIA:  General.  DETAILS:  Usual prep and drape, 360-degree limbal peritomy, isolation of 4 rectus muscles on 2-0 silk, scleral dissection for 360 degrees to admit a 279 intrascleral implant, diathermy placed in the bed.  The 279 implant was placed around the globe with the joint at 8:30 o'clock.  The 240 band was placed around the globe with a 270 sleeve at 8 o'clock. Perforation site was chosen at 2:30 o'clock.  A large amount of clear colorless subretinal fluid came forth in a controlled slow manner.  SF6 100% 0.4 mL was injected into the vitreous cavity as the fluid egressed. Indirect ophthalmoscopy showed the retina to be lying nicely on the scleral buckle.  Radial element was placed.  508G radial element was placed beneath the retinal break at 12:30 o'clock.  The buckle was adjusted and trimmed.  The band was adjusted and trimmed.  The scleral sutures were knotted and the free ends removed.  The indirect ophthalmoscope laser was moved into place, 783 burns were placed around the retinal break on the scleral buckle for 360 degrees.  The power was 460 mW, 1000 microns each and 0.1 seconds each.  The conjunctiva was reposited with 7-0 chromic suture.  Paracentesis x2, obtained a closing pressure of 10 with a Barraquer tonometer.  Polymyxin and gentamicin were irrigated into tenon space.  Atropine solution was  applied. Marcaine was injected around the globe for postop pain.  Decadron 10 mg was injected to the lower subconjunctival space.  Polysporin ophthalmic ointment, a patch and shield were placed.  The patient was awakened and taken to recovery in satisfactory condition.  COMPLICATIONS:  None.  DURATION:  Two hours.  The patient was awakened and taken to recovery in satisfactory condition.     Beulah Gandy. Ashley Royalty, M.D.     JDM/MEDQ  D:  06/23/2011  T:  06/23/2011  Job:  [id number]

## 2011-06-23 NOTE — Transfer of Care (Signed)
Immediate Anesthesia Transfer of Care Note  Patient: Vanessa Day  Procedure(s) Performed: Procedure(s) (LRB): SCLERAL BUCKLE (Left) DIODE LASER APPLICATION (Left) INSERTION OF GAS (Left)  Patient Location: PACU  Anesthesia Type: General  Level of Consciousness: awake and alert   Airway & Oxygen Therapy: Patient Spontanous Breathing  Post-op Assessment: Report given to PACU RN  Post vital signs: stable  Complications: No apparent anesthesia complications

## 2011-06-23 NOTE — Anesthesia Preprocedure Evaluation (Addendum)
Anesthesia Evaluation  Patient identified by MRN, date of birth, ID band Patient awake    Reviewed: Allergy & Precautions, H&P , NPO status , Patient's Chart, lab work & pertinent test results  History of Anesthesia Complications (+) PONV  Airway Mallampati: II  Neck ROM: Full    Dental  (+) Teeth Intact and Dental Advisory Given   Pulmonary neg pulmonary ROS,  breath sounds clear to auscultation        Cardiovascular hypertension, Pt. on medications Rhythm:Regular Rate:Normal     Neuro/Psych negative neurological ROS  negative psych ROS   GI/Hepatic Neg liver ROS, hiatal hernia, GERD-  Controlled,  Endo/Other  Diabetes mellitus-, Type 2  Renal/GU negative Renal ROS     Musculoskeletal  (+) Arthritis -, Osteoarthritis,    Abdominal   Peds  Hematology negative hematology ROS (+)   Anesthesia Other Findings   Reproductive/Obstetrics                        Anesthesia Physical Anesthesia Plan  ASA: III  Anesthesia Plan: General   Post-op Pain Management:    Induction: Intravenous  Airway Management Planned: Oral ETT  Additional Equipment:   Intra-op Plan:   Post-operative Plan: Extubation in OR  Informed Consent: I have reviewed the patients History and Physical, chart, labs and discussed the procedure including the risks, benefits and alternatives for the proposed anesthesia with the patient or authorized representative who has indicated his/her understanding and acceptance.   Dental advisory given  Plan Discussed with: CRNA, Anesthesiologist and Surgeon  Anesthesia Plan Comments:        Anesthesia Quick Evaluation

## 2011-06-23 NOTE — Anesthesia Postprocedure Evaluation (Signed)
  Anesthesia Post-op Note  Patient: Vanessa Day  Procedure(s) Performed: Procedure(s) (LRB): SCLERAL BUCKLE (Left) DIODE LASER APPLICATION (Left) INSERTION OF GAS (Left)  Patient Location: PACU  Anesthesia Type: General  Level of Consciousness: awake  Airway and Oxygen Therapy: Patient Spontanous Breathing  Post-op Pain: mild  Post-op Assessment: Post-op Vital signs reviewed  Post-op Vital Signs: Reviewed  Complications: No apparent anesthesia complications

## 2011-06-23 NOTE — Preoperative (Signed)
Beta Blockers   Reason not to administer Beta Blockers:Not Applicable 

## 2011-06-24 LAB — GLUCOSE, CAPILLARY: Glucose-Capillary: 139 mg/dL — ABNORMAL HIGH (ref 70–99)

## 2011-06-24 MED ORDER — OXYCODONE-ACETAMINOPHEN 5-325 MG PO TABS: 1.0000 | ORAL_TABLET | ORAL | Status: AC | PRN

## 2011-06-24 MED ORDER — BACITRACIN-POLYMYXIN B 500-10000 UNIT/GM OP OINT: 1.0000 "application " | TOPICAL_OINTMENT | Freq: Four times a day (QID) | OPHTHALMIC | Status: AC

## 2011-06-24 MED ORDER — BRIMONIDINE TARTRATE 0.2 % OP SOLN: 1.0000 [drp] | Freq: Two times a day (BID) | OPHTHALMIC | Status: AC

## 2011-06-24 MED ORDER — PREDNISOLONE ACETATE 1 % OP SUSP: 1.0000 [drp] | Freq: Four times a day (QID) | OPHTHALMIC | Status: AC

## 2011-06-24 MED ORDER — GATIFLOXACIN 0.5 % OP SOLN: 1.0000 [drp] | Freq: Four times a day (QID) | OPHTHALMIC | Status: DC

## 2011-06-24 MED FILL — Mupirocin Oint 2%: CUTANEOUS | Qty: 22 | Status: AC

## 2011-06-24 NOTE — Progress Notes (Signed)
06/24/2011, 6:53 AM  Mental Status:  Awake, Alert, Oriented  Anterior segment: Cornea  Clear    Anterior Chamber Clear    Lens:    Cataract  Intra Ocular Pressure 26 mmHg with Tonopen  Vitreous: Clear 20%gas bubble   Retina:  Attached Good laser reaction  Impression: Excellent result Retina attached   Final Diagnosis: Active Problems:  Rhegmatogenous Retinal Detachment    Plan: start post operative eye drops.  Discharge to home.  Give post operative instructions  Sherrie George 06/24/2011, 6:53 AM

## 2011-06-24 NOTE — Discharge Summary (Signed)
Discharge summary not needed on [facility]ER patients per medical records. Discharge summary not needed on [facility] patients per medical records.

## 2011-06-24 NOTE — Progress Notes (Signed)
DC home with husband. Verbally understood DC instructions. No questions asked. 

## 2011-06-28 ENCOUNTER — Encounter (HOSPITAL_COMMUNITY): Payer: Self-pay | Admitting: Ophthalmology

## 2011-06-30 ENCOUNTER — Encounter (INDEPENDENT_AMBULATORY_CARE_PROVIDER_SITE_OTHER): Payer: Medicare Other | Admitting: Ophthalmology

## 2011-06-30 DIAGNOSIS — H33009 Unspecified retinal detachment with retinal break, unspecified eye: Secondary | ICD-10-CM

## 2011-07-06 ENCOUNTER — Ambulatory Visit (INDEPENDENT_AMBULATORY_CARE_PROVIDER_SITE_OTHER): Payer: Medicare Other | Admitting: Ophthalmology

## 2011-07-06 DIAGNOSIS — H33009 Unspecified retinal detachment with retinal break, unspecified eye: Secondary | ICD-10-CM

## 2011-07-20 ENCOUNTER — Ambulatory Visit (INDEPENDENT_AMBULATORY_CARE_PROVIDER_SITE_OTHER): Payer: Medicare Other | Admitting: Ophthalmology

## 2011-07-20 DIAGNOSIS — H33009 Unspecified retinal detachment with retinal break, unspecified eye: Secondary | ICD-10-CM

## 2011-09-28 ENCOUNTER — Encounter (INDEPENDENT_AMBULATORY_CARE_PROVIDER_SITE_OTHER): Payer: Medicare Other | Admitting: Ophthalmology

## 2011-09-28 DIAGNOSIS — H33009 Unspecified retinal detachment with retinal break, unspecified eye: Secondary | ICD-10-CM

## 2011-09-28 DIAGNOSIS — H35379 Puckering of macula, unspecified eye: Secondary | ICD-10-CM

## 2011-09-28 DIAGNOSIS — H43819 Vitreous degeneration, unspecified eye: Secondary | ICD-10-CM

## 2011-11-09 ENCOUNTER — Encounter (INDEPENDENT_AMBULATORY_CARE_PROVIDER_SITE_OTHER): Payer: Medicare Other | Admitting: Ophthalmology

## 2011-11-09 DIAGNOSIS — H35379 Puckering of macula, unspecified eye: Secondary | ICD-10-CM

## 2011-11-09 DIAGNOSIS — H35359 Cystoid macular degeneration, unspecified eye: Secondary | ICD-10-CM

## 2011-11-09 DIAGNOSIS — H43819 Vitreous degeneration, unspecified eye: Secondary | ICD-10-CM

## 2011-11-09 DIAGNOSIS — H35039 Hypertensive retinopathy, unspecified eye: Secondary | ICD-10-CM

## 2011-11-09 DIAGNOSIS — I10 Essential (primary) hypertension: Secondary | ICD-10-CM

## 2012-01-09 ENCOUNTER — Encounter (INDEPENDENT_AMBULATORY_CARE_PROVIDER_SITE_OTHER): Payer: Medicare Other | Admitting: Ophthalmology

## 2012-01-09 DIAGNOSIS — H35379 Puckering of macula, unspecified eye: Secondary | ICD-10-CM

## 2012-01-09 DIAGNOSIS — H26499 Other secondary cataract, unspecified eye: Secondary | ICD-10-CM

## 2012-01-09 DIAGNOSIS — H35039 Hypertensive retinopathy, unspecified eye: Secondary | ICD-10-CM

## 2012-01-09 DIAGNOSIS — I10 Essential (primary) hypertension: Secondary | ICD-10-CM

## 2012-01-09 DIAGNOSIS — H43819 Vitreous degeneration, unspecified eye: Secondary | ICD-10-CM

## 2012-01-09 DIAGNOSIS — H33009 Unspecified retinal detachment with retinal break, unspecified eye: Secondary | ICD-10-CM

## 2012-02-21 ENCOUNTER — Other Ambulatory Visit: Payer: Self-pay | Admitting: Obstetrics and Gynecology

## 2012-02-21 DIAGNOSIS — Z1231 Encounter for screening mammogram for malignant neoplasm of breast: Secondary | ICD-10-CM

## 2012-03-15 ENCOUNTER — Ambulatory Visit
Admission: RE | Admit: 2012-03-15 | Discharge: 2012-03-15 | Disposition: A | Discharge status: Home / Self Care | Payer: Medicare Other | Source: Ambulatory Visit | Attending: Obstetrics and Gynecology | Admitting: Obstetrics and Gynecology

## 2012-03-15 DIAGNOSIS — Z1231 Encounter for screening mammogram for malignant neoplasm of breast: Secondary | ICD-10-CM

## 2012-04-09 ENCOUNTER — Encounter (INDEPENDENT_AMBULATORY_CARE_PROVIDER_SITE_OTHER): Payer: Medicare Other | Admitting: Ophthalmology

## 2012-04-09 DIAGNOSIS — H35359 Cystoid macular degeneration, unspecified eye: Secondary | ICD-10-CM

## 2012-04-09 DIAGNOSIS — H35379 Puckering of macula, unspecified eye: Secondary | ICD-10-CM

## 2012-04-09 DIAGNOSIS — H35039 Hypertensive retinopathy, unspecified eye: Secondary | ICD-10-CM

## 2012-04-09 DIAGNOSIS — H33009 Unspecified retinal detachment with retinal break, unspecified eye: Secondary | ICD-10-CM

## 2012-04-09 DIAGNOSIS — I10 Essential (primary) hypertension: Secondary | ICD-10-CM

## 2012-04-09 DIAGNOSIS — H43819 Vitreous degeneration, unspecified eye: Secondary | ICD-10-CM

## 2012-07-10 ENCOUNTER — Encounter (INDEPENDENT_AMBULATORY_CARE_PROVIDER_SITE_OTHER): Payer: Medicare Other | Admitting: Ophthalmology

## 2012-07-10 DIAGNOSIS — H35379 Puckering of macula, unspecified eye: Secondary | ICD-10-CM

## 2012-07-10 DIAGNOSIS — H35359 Cystoid macular degeneration, unspecified eye: Secondary | ICD-10-CM

## 2012-07-10 DIAGNOSIS — I10 Essential (primary) hypertension: Secondary | ICD-10-CM

## 2012-07-10 DIAGNOSIS — H35039 Hypertensive retinopathy, unspecified eye: Secondary | ICD-10-CM

## 2012-07-10 DIAGNOSIS — H43819 Vitreous degeneration, unspecified eye: Secondary | ICD-10-CM

## 2012-07-10 DIAGNOSIS — H33009 Unspecified retinal detachment with retinal break, unspecified eye: Secondary | ICD-10-CM

## 2012-07-30 ENCOUNTER — Other Ambulatory Visit: Payer: Self-pay | Admitting: Obstetrics and Gynecology

## 2012-08-13 ENCOUNTER — Other Ambulatory Visit: Payer: Self-pay | Admitting: Family Medicine

## 2012-08-13 ENCOUNTER — Ambulatory Visit
Admission: RE | Admit: 2012-08-13 | Discharge: 2012-08-13 | Disposition: A | Discharge status: Home / Self Care | Payer: Medicare Other | Source: Ambulatory Visit | Attending: Family Medicine | Admitting: Family Medicine

## 2012-08-13 DIAGNOSIS — R52 Pain, unspecified: Secondary | ICD-10-CM

## 2012-09-18 ENCOUNTER — Other Ambulatory Visit: Payer: Self-pay | Admitting: Internal Medicine

## 2012-09-18 DIAGNOSIS — M25551 Pain in right hip: Secondary | ICD-10-CM

## 2012-09-22 ENCOUNTER — Ambulatory Visit
Admission: RE | Admit: 2012-09-22 | Discharge: 2012-09-22 | Disposition: A | Discharge status: Home / Self Care | Payer: Medicare Other | Source: Ambulatory Visit | Attending: Internal Medicine | Admitting: Internal Medicine

## 2012-09-22 DIAGNOSIS — M25551 Pain in right hip: Secondary | ICD-10-CM

## 2012-10-11 ENCOUNTER — Encounter (INDEPENDENT_AMBULATORY_CARE_PROVIDER_SITE_OTHER): Payer: Medicare Other | Admitting: Ophthalmology

## 2012-10-11 DIAGNOSIS — H35379 Puckering of macula, unspecified eye: Secondary | ICD-10-CM

## 2012-10-11 DIAGNOSIS — H35039 Hypertensive retinopathy, unspecified eye: Secondary | ICD-10-CM

## 2012-10-11 DIAGNOSIS — H43819 Vitreous degeneration, unspecified eye: Secondary | ICD-10-CM

## 2012-10-11 DIAGNOSIS — I10 Essential (primary) hypertension: Secondary | ICD-10-CM

## 2012-10-11 DIAGNOSIS — H35359 Cystoid macular degeneration, unspecified eye: Secondary | ICD-10-CM

## 2012-10-11 DIAGNOSIS — H3530 Unspecified macular degeneration: Secondary | ICD-10-CM

## 2013-03-04 ENCOUNTER — Other Ambulatory Visit: Payer: Self-pay

## 2013-03-04 DIAGNOSIS — Z1231 Encounter for screening mammogram for malignant neoplasm of breast: Secondary | ICD-10-CM

## 2013-03-04 DIAGNOSIS — Z9011 Acquired absence of right breast and nipple: Secondary | ICD-10-CM

## 2013-03-19 ENCOUNTER — Ambulatory Visit
Admission: RE | Admit: 2013-03-19 | Discharge: 2013-03-19 | Disposition: A | Discharge status: Home / Self Care | Payer: 59 | Source: Ambulatory Visit

## 2013-03-19 DIAGNOSIS — Z1231 Encounter for screening mammogram for malignant neoplasm of breast: Secondary | ICD-10-CM

## 2013-03-19 DIAGNOSIS — Z9011 Acquired absence of right breast and nipple: Secondary | ICD-10-CM

## 2013-04-10 ENCOUNTER — Ambulatory Visit (INDEPENDENT_AMBULATORY_CARE_PROVIDER_SITE_OTHER): Payer: Medicare Other | Admitting: Ophthalmology

## 2013-04-10 DIAGNOSIS — H43819 Vitreous degeneration, unspecified eye: Secondary | ICD-10-CM

## 2013-04-10 DIAGNOSIS — H35359 Cystoid macular degeneration, unspecified eye: Secondary | ICD-10-CM

## 2013-04-10 DIAGNOSIS — I10 Essential (primary) hypertension: Secondary | ICD-10-CM

## 2013-04-10 DIAGNOSIS — H35379 Puckering of macula, unspecified eye: Secondary | ICD-10-CM

## 2013-04-10 DIAGNOSIS — H35039 Hypertensive retinopathy, unspecified eye: Secondary | ICD-10-CM

## 2013-04-10 DIAGNOSIS — H33009 Unspecified retinal detachment with retinal break, unspecified eye: Secondary | ICD-10-CM

## 2013-04-10 DIAGNOSIS — H3530 Unspecified macular degeneration: Secondary | ICD-10-CM

## 2013-10-25 ENCOUNTER — Ambulatory Visit (INDEPENDENT_AMBULATORY_CARE_PROVIDER_SITE_OTHER): Payer: Medicare Other | Admitting: Ophthalmology

## 2013-10-25 DIAGNOSIS — H35033 Hypertensive retinopathy, bilateral: Secondary | ICD-10-CM

## 2013-10-25 DIAGNOSIS — H338 Other retinal detachments: Secondary | ICD-10-CM

## 2013-10-25 DIAGNOSIS — I10 Essential (primary) hypertension: Secondary | ICD-10-CM

## 2013-10-25 DIAGNOSIS — H35352 Cystoid macular degeneration, left eye: Secondary | ICD-10-CM

## 2013-10-25 DIAGNOSIS — H35372 Puckering of macula, left eye: Secondary | ICD-10-CM

## 2013-10-25 DIAGNOSIS — H43813 Vitreous degeneration, bilateral: Secondary | ICD-10-CM

## 2014-04-28 ENCOUNTER — Ambulatory Visit (INDEPENDENT_AMBULATORY_CARE_PROVIDER_SITE_OTHER): Payer: Medicare Other | Admitting: Ophthalmology

## 2014-04-30 ENCOUNTER — Ambulatory Visit (INDEPENDENT_AMBULATORY_CARE_PROVIDER_SITE_OTHER): Payer: Medicare (Managed Care) | Admitting: Ophthalmology

## 2014-04-30 DIAGNOSIS — H59032 Cystoid macular edema following cataract surgery, left eye: Secondary | ICD-10-CM

## 2014-04-30 DIAGNOSIS — D3131 Benign neoplasm of right choroid: Secondary | ICD-10-CM

## 2014-04-30 DIAGNOSIS — H338 Other retinal detachments: Secondary | ICD-10-CM | POA: Diagnosis not present

## 2014-04-30 DIAGNOSIS — H35372 Puckering of macula, left eye: Secondary | ICD-10-CM

## 2014-04-30 DIAGNOSIS — I10 Essential (primary) hypertension: Secondary | ICD-10-CM | POA: Diagnosis not present

## 2014-04-30 DIAGNOSIS — H35033 Hypertensive retinopathy, bilateral: Secondary | ICD-10-CM

## 2014-09-02 ENCOUNTER — Other Ambulatory Visit: Payer: Self-pay

## 2014-09-02 DIAGNOSIS — Z1231 Encounter for screening mammogram for malignant neoplasm of breast: Secondary | ICD-10-CM

## 2014-10-10 ENCOUNTER — Ambulatory Visit
Admission: RE | Admit: 2014-10-10 | Discharge: 2014-10-10 | Disposition: A | Discharge status: Home / Self Care | Payer: Medicare Other | Source: Ambulatory Visit

## 2014-10-10 DIAGNOSIS — Z1231 Encounter for screening mammogram for malignant neoplasm of breast: Secondary | ICD-10-CM

## 2014-10-30 ENCOUNTER — Ambulatory Visit (INDEPENDENT_AMBULATORY_CARE_PROVIDER_SITE_OTHER): Payer: Medicare (Managed Care) | Admitting: Ophthalmology

## 2014-10-30 DIAGNOSIS — D3131 Benign neoplasm of right choroid: Secondary | ICD-10-CM

## 2014-10-30 DIAGNOSIS — I10 Essential (primary) hypertension: Secondary | ICD-10-CM

## 2014-10-30 DIAGNOSIS — H35033 Hypertensive retinopathy, bilateral: Secondary | ICD-10-CM | POA: Diagnosis not present

## 2014-10-30 DIAGNOSIS — H43813 Vitreous degeneration, bilateral: Secondary | ICD-10-CM | POA: Diagnosis not present

## 2014-10-30 DIAGNOSIS — H35373 Puckering of macula, bilateral: Secondary | ICD-10-CM

## 2015-05-11 ENCOUNTER — Other Ambulatory Visit: Payer: Self-pay | Admitting: Family Medicine

## 2015-05-11 DIAGNOSIS — R1011 Right upper quadrant pain: Secondary | ICD-10-CM

## 2015-05-13 ENCOUNTER — Other Ambulatory Visit: Payer: Self-pay | Admitting: Physician Assistant

## 2015-05-13 ENCOUNTER — Ambulatory Visit
Admission: RE | Admit: 2015-05-13 | Discharge: 2015-05-13 | Disposition: A | Discharge status: Home / Self Care | Payer: Medicare Other | Source: Ambulatory Visit | Attending: Physician Assistant | Admitting: Physician Assistant

## 2015-05-13 DIAGNOSIS — R52 Pain, unspecified: Secondary | ICD-10-CM

## 2015-05-18 ENCOUNTER — Ambulatory Visit
Admission: RE | Admit: 2015-05-18 | Discharge: 2015-05-18 | Disposition: A | Discharge status: Home / Self Care | Payer: Medicare Other | Source: Ambulatory Visit | Attending: Family Medicine | Admitting: Family Medicine

## 2015-05-18 DIAGNOSIS — R1011 Right upper quadrant pain: Secondary | ICD-10-CM

## 2015-07-06 DIAGNOSIS — M19042 Primary osteoarthritis, left hand: Secondary | ICD-10-CM | POA: Insufficient documentation

## 2015-07-06 DIAGNOSIS — M1812 Unilateral primary osteoarthritis of first carpometacarpal joint, left hand: Secondary | ICD-10-CM | POA: Insufficient documentation

## 2015-07-07 ENCOUNTER — Other Ambulatory Visit: Payer: Self-pay | Admitting: Family Medicine

## 2015-07-07 ENCOUNTER — Ambulatory Visit
Admission: RE | Admit: 2015-07-07 | Discharge: 2015-07-07 | Disposition: A | Discharge status: Home / Self Care | Payer: Medicare Other | Source: Ambulatory Visit | Attending: Family Medicine | Admitting: Family Medicine

## 2015-07-07 DIAGNOSIS — M542 Cervicalgia: Secondary | ICD-10-CM

## 2015-07-13 ENCOUNTER — Other Ambulatory Visit: Payer: Self-pay | Admitting: Family Medicine

## 2015-07-13 DIAGNOSIS — M542 Cervicalgia: Secondary | ICD-10-CM

## 2015-07-18 ENCOUNTER — Ambulatory Visit
Admission: RE | Admit: 2015-07-18 | Discharge: 2015-07-18 | Disposition: A | Discharge status: Home / Self Care | Payer: Medicare Other | Source: Ambulatory Visit | Attending: Family Medicine | Admitting: Family Medicine

## 2015-07-18 DIAGNOSIS — M542 Cervicalgia: Secondary | ICD-10-CM

## 2015-07-29 DIAGNOSIS — M542 Cervicalgia: Secondary | ICD-10-CM | POA: Insufficient documentation

## 2015-08-06 ENCOUNTER — Other Ambulatory Visit: Payer: Self-pay | Admitting: Orthopedic Surgery

## 2015-08-07 ENCOUNTER — Other Ambulatory Visit: Payer: Self-pay | Admitting: Orthopedic Surgery

## 2015-08-19 ENCOUNTER — Encounter (HOSPITAL_BASED_OUTPATIENT_CLINIC_OR_DEPARTMENT_OTHER): Payer: Self-pay | Admitting: *Deleted

## 2015-08-19 ENCOUNTER — Encounter (HOSPITAL_BASED_OUTPATIENT_CLINIC_OR_DEPARTMENT_OTHER)
Admission: RE | Admit: 2015-08-19 | Discharge: 2015-08-19 | Disposition: A | Discharge status: Home / Self Care | Payer: Medicare Other | Source: Ambulatory Visit | Attending: Orthopedic Surgery | Admitting: Orthopedic Surgery

## 2015-08-19 DIAGNOSIS — M13842 Other specified arthritis, left hand: Secondary | ICD-10-CM | POA: Diagnosis present

## 2015-08-19 DIAGNOSIS — I10 Essential (primary) hypertension: Secondary | ICD-10-CM | POA: Diagnosis not present

## 2015-08-19 DIAGNOSIS — F419 Anxiety disorder, unspecified: Secondary | ICD-10-CM | POA: Diagnosis not present

## 2015-08-19 DIAGNOSIS — K219 Gastro-esophageal reflux disease without esophagitis: Secondary | ICD-10-CM | POA: Diagnosis not present

## 2015-08-19 DIAGNOSIS — Z87891 Personal history of nicotine dependence: Secondary | ICD-10-CM | POA: Diagnosis not present

## 2015-08-19 LAB — BASIC METABOLIC PANEL
ANION GAP: 8 (ref 5–15)
BUN: 14 mg/dL (ref 6–20)
CHLORIDE: 101 mmol/L (ref 101–111)
CO2: 31 mmol/L (ref 22–32)
Calcium: 9.4 mg/dL (ref 8.9–10.3)
Creatinine, Ser: 0.94 mg/dL (ref 0.44–1.00)
GFR calc non Af Amer: 58 mL/min — ABNORMAL LOW (ref >60)
Glucose, Bld: 101 mg/dL — ABNORMAL HIGH (ref 65–99)
POTASSIUM: 3.9 mmol/L (ref 3.5–5.1)
Sodium: 140 mmol/L (ref 135–145)

## 2015-08-25 ENCOUNTER — Ambulatory Visit (HOSPITAL_BASED_OUTPATIENT_CLINIC_OR_DEPARTMENT_OTHER): Payer: Medicare Other | Admitting: Certified Registered"

## 2015-08-25 ENCOUNTER — Ambulatory Visit (HOSPITAL_BASED_OUTPATIENT_CLINIC_OR_DEPARTMENT_OTHER)
Admission: RE | Admit: 2015-08-25 | Discharge: 2015-08-25 | Disposition: A | Discharge status: Home / Self Care | Payer: Medicare Other | Source: Ambulatory Visit | Attending: Orthopedic Surgery | Admitting: Orthopedic Surgery

## 2015-08-25 ENCOUNTER — Encounter (HOSPITAL_BASED_OUTPATIENT_CLINIC_OR_DEPARTMENT_OTHER): Payer: Self-pay | Admitting: Certified Registered"

## 2015-08-25 ENCOUNTER — Encounter (HOSPITAL_BASED_OUTPATIENT_CLINIC_OR_DEPARTMENT_OTHER)
Admission: RE | Disposition: A | Discharge status: Home / Self Care | Payer: Self-pay | Source: Ambulatory Visit | Attending: Orthopedic Surgery

## 2015-08-25 DIAGNOSIS — M13842 Other specified arthritis, left hand: Secondary | ICD-10-CM | POA: Diagnosis not present

## 2015-08-25 DIAGNOSIS — F419 Anxiety disorder, unspecified: Secondary | ICD-10-CM | POA: Insufficient documentation

## 2015-08-25 DIAGNOSIS — I10 Essential (primary) hypertension: Secondary | ICD-10-CM | POA: Diagnosis not present

## 2015-08-25 DIAGNOSIS — Z87891 Personal history of nicotine dependence: Secondary | ICD-10-CM | POA: Diagnosis not present

## 2015-08-25 DIAGNOSIS — K219 Gastro-esophageal reflux disease without esophagitis: Secondary | ICD-10-CM | POA: Insufficient documentation

## 2015-08-25 HISTORY — DX: Irritable bowel syndrome, unspecified: K58.9

## 2015-08-25 HISTORY — DX: Prediabetes: R73.03

## 2015-08-25 HISTORY — DX: Anxiety disorder, unspecified: F41.9

## 2015-08-25 HISTORY — PX: CARPOMETACARPEL SUSPENSION PLASTY: SHX5005

## 2015-08-25 HISTORY — DX: Malignant (primary) neoplasm, unspecified: C80.1

## 2015-08-25 HISTORY — DX: Cervicalgia: M54.2

## 2015-08-25 HISTORY — DX: Hyperlipidemia, unspecified: E78.5

## 2015-08-25 SURGERY — CARPOMETACARPEL (CMC) SUSPENSION PLASTY
Anesthesia: General | Site: Thumb | Laterality: Left

## 2015-08-25 MED ORDER — HYDROMORPHONE HCL 1 MG/ML IJ SOLN: 0.2500 mg | INTRAMUSCULAR | Status: DC | PRN

## 2015-08-25 MED ORDER — HYDROCODONE-ACETAMINOPHEN 5-325 MG PO TABS: 1.0000 | ORAL_TABLET | Freq: Four times a day (QID) | ORAL | 0 refills | Status: DC | PRN

## 2015-08-25 MED ORDER — GLYCOPYRROLATE 0.2 MG/ML IJ SOLN: 0.2000 mg | Freq: Once | INTRAMUSCULAR | Status: DC | PRN

## 2015-08-25 MED ORDER — MIDAZOLAM HCL 2 MG/2ML IJ SOLN
INTRAMUSCULAR | Status: AC
  Filled 2015-08-25: qty 2

## 2015-08-25 MED ORDER — MIDAZOLAM HCL 2 MG/2ML IJ SOLN
1.0000 mg | INTRAMUSCULAR | Status: DC | PRN
  Administered 2015-08-25: 1 mg via INTRAVENOUS

## 2015-08-25 MED ORDER — EPHEDRINE SULFATE 50 MG/ML IJ SOLN
INTRAMUSCULAR | Status: DC | PRN
  Administered 2015-08-25: 10 mg via INTRAVENOUS

## 2015-08-25 MED ORDER — LIDOCAINE 2% (20 MG/ML) 5 ML SYRINGE
INTRAMUSCULAR | Status: AC
  Filled 2015-08-25: qty 5

## 2015-08-25 MED ORDER — OXYCODONE HCL 5 MG PO TABS: 5.0000 mg | ORAL_TABLET | Freq: Once | ORAL | Status: DC | PRN

## 2015-08-25 MED ORDER — PHENYLEPHRINE HCL 10 MG/ML IJ SOLN
INTRAMUSCULAR | Status: DC | PRN
  Administered 2015-08-25 (×3): 40 ug via INTRAVENOUS

## 2015-08-25 MED ORDER — CEFAZOLIN SODIUM-DEXTROSE 2-4 GM/100ML-% IV SOLN
2.0000 g | INTRAVENOUS | Status: AC
  Administered 2015-08-25: 2 g via INTRAVENOUS

## 2015-08-25 MED ORDER — ONDANSETRON HCL 4 MG/2ML IJ SOLN
INTRAMUSCULAR | Status: AC
  Filled 2015-08-25: qty 2

## 2015-08-25 MED ORDER — FENTANYL CITRATE (PF) 100 MCG/2ML IJ SOLN
50.0000 ug | INTRAMUSCULAR | Status: DC | PRN
  Administered 2015-08-25: 100 ug via INTRAVENOUS

## 2015-08-25 MED ORDER — PHENYLEPHRINE 40 MCG/ML (10ML) SYRINGE FOR IV PUSH (FOR BLOOD PRESSURE SUPPORT)
PREFILLED_SYRINGE | INTRAVENOUS | Status: AC
  Filled 2015-08-25: qty 10

## 2015-08-25 MED ORDER — DEXAMETHASONE SODIUM PHOSPHATE 10 MG/ML IJ SOLN
INTRAMUSCULAR | Status: DC | PRN
  Administered 2015-08-25: 10 mg via INTRAVENOUS

## 2015-08-25 MED ORDER — BUPIVACAINE-EPINEPHRINE (PF) 0.5% -1:200000 IJ SOLN
INTRAMUSCULAR | Status: DC | PRN
Start: 2015-08-25 — End: 2015-08-25
  Administered 2015-08-25: 25 mL via PERINEURAL

## 2015-08-25 MED ORDER — MEPERIDINE HCL 25 MG/ML IJ SOLN: 6.2500 mg | INTRAMUSCULAR | Status: DC | PRN

## 2015-08-25 MED ORDER — OXYCODONE HCL 5 MG/5ML PO SOLN: 5.0000 mg | Freq: Once | ORAL | Status: DC | PRN

## 2015-08-25 MED ORDER — SCOPOLAMINE 1 MG/3DAYS TD PT72: 1.0000 | MEDICATED_PATCH | Freq: Once | TRANSDERMAL | Status: DC | PRN

## 2015-08-25 MED ORDER — ONDANSETRON HCL 4 MG/2ML IJ SOLN
INTRAMUSCULAR | Status: DC | PRN
  Administered 2015-08-25: 4 mg via INTRAVENOUS

## 2015-08-25 MED ORDER — CHLORHEXIDINE GLUCONATE 4 % EX LIQD: 60.0000 mL | Freq: Once | CUTANEOUS | Status: DC

## 2015-08-25 MED ORDER — CEFAZOLIN SODIUM-DEXTROSE 2-4 GM/100ML-% IV SOLN
INTRAVENOUS | Status: AC
  Filled 2015-08-25: qty 100

## 2015-08-25 MED ORDER — LIDOCAINE 2% (20 MG/ML) 5 ML SYRINGE
INTRAMUSCULAR | Status: DC | PRN
  Administered 2015-08-25: 30 mg via INTRAVENOUS

## 2015-08-25 MED ORDER — LACTATED RINGERS IV SOLN
INTRAVENOUS | Status: DC
  Administered 2015-08-25 (×2): via INTRAVENOUS

## 2015-08-25 MED ORDER — ONDANSETRON HCL 4 MG/2ML IJ SOLN: 4.0000 mg | Freq: Once | INTRAMUSCULAR | Status: DC | PRN

## 2015-08-25 MED ORDER — FENTANYL CITRATE (PF) 100 MCG/2ML IJ SOLN
INTRAMUSCULAR | Status: AC
  Filled 2015-08-25: qty 2

## 2015-08-25 MED ORDER — PROPOFOL 10 MG/ML IV BOLUS
INTRAVENOUS | Status: DC | PRN
Start: 2015-08-25 — End: 2015-08-25
  Administered 2015-08-25: 150 mg via INTRAVENOUS

## 2015-08-25 MED ORDER — EPHEDRINE 5 MG/ML INJ
INTRAVENOUS | Status: AC
  Filled 2015-08-25: qty 10

## 2015-08-25 SURGICAL SUPPLY — 60 items
BLADE ARTHRO LOK 4 BEAVER (BLADE) IMPLANT
BLADE MINI RND TIP GREEN BEAV (BLADE) ×2 IMPLANT
BLADE SURG 15 STRL LF DISP TIS (BLADE) ×1 IMPLANT
BLADE SURG 15 STRL SS (BLADE) ×1
BNDG COHESIVE 3X5 TAN STRL LF (GAUZE/BANDAGES/DRESSINGS) ×2 IMPLANT
BNDG ESMARK 4X9 LF (GAUZE/BANDAGES/DRESSINGS) ×2 IMPLANT
BNDG GAUZE ELAST 4 BULKY (GAUZE/BANDAGES/DRESSINGS) ×2 IMPLANT
BUR EGG 3PK/BX (BURR) IMPLANT
CHLORAPREP W/TINT 26ML (MISCELLANEOUS) ×2 IMPLANT
CORDS BIPOLAR (ELECTRODE) ×2 IMPLANT
COVER BACK TABLE 60X90IN (DRAPES) ×2 IMPLANT
COVER MAYO STAND STRL (DRAPES) ×2 IMPLANT
CUFF TOURNIQUET SINGLE 18IN (TOURNIQUET CUFF) IMPLANT
DECANTER SPIKE VIAL GLASS SM (MISCELLANEOUS) IMPLANT
DRAPE EXTREMITY T 121X128X90 (DRAPE) ×2 IMPLANT
DRAPE OEC MINIVIEW 54X84 (DRAPES) ×2 IMPLANT
DRAPE SURG 17X23 STRL (DRAPES) ×2 IMPLANT
GAUZE SPONGE 4X4 12PLY STRL (GAUZE/BANDAGES/DRESSINGS) ×2 IMPLANT
GAUZE SPONGE 4X4 16PLY XRAY LF (GAUZE/BANDAGES/DRESSINGS) IMPLANT
GAUZE XEROFORM 1X8 LF (GAUZE/BANDAGES/DRESSINGS) ×2 IMPLANT
GLOVE BIO SURGEON STRL SZ7.5 (GLOVE) ×2 IMPLANT
GLOVE BIOGEL PI IND STRL 7.0 (GLOVE) ×2 IMPLANT
GLOVE BIOGEL PI IND STRL 8 (GLOVE) ×1 IMPLANT
GLOVE BIOGEL PI IND STRL 8.5 (GLOVE) ×1 IMPLANT
GLOVE BIOGEL PI INDICATOR 7.0 (GLOVE) ×2
GLOVE BIOGEL PI INDICATOR 8 (GLOVE) ×1
GLOVE BIOGEL PI INDICATOR 8.5 (GLOVE) ×1
GLOVE SURG ORTHO 8.0 STRL STRW (GLOVE) ×2 IMPLANT
GOWN STRL REUS W/ TWL LRG LVL3 (GOWN DISPOSABLE) ×2 IMPLANT
GOWN STRL REUS W/TWL LRG LVL3 (GOWN DISPOSABLE) ×2
GOWN STRL REUS W/TWL XL LVL3 (GOWN DISPOSABLE) ×2 IMPLANT
NEEDLE PRECISIONGLIDE 27X1.5 (NEEDLE) IMPLANT
NS IRRIG 1000ML POUR BTL (IV SOLUTION) ×2 IMPLANT
PACK BASIN DAY SURGERY FS (CUSTOM PROCEDURE TRAY) ×2 IMPLANT
PAD CAST 3X4 CTTN HI CHSV (CAST SUPPLIES) ×1 IMPLANT
PADDING CAST ABS 3INX4YD NS (CAST SUPPLIES)
PADDING CAST ABS COTTON 3X4 (CAST SUPPLIES) IMPLANT
PADDING CAST COTTON 3X4 STRL (CAST SUPPLIES) ×1
RUBBERBAND STERILE (MISCELLANEOUS) IMPLANT
SLEEVE SCD COMPRESS KNEE MED (MISCELLANEOUS) ×2 IMPLANT
SPLINT PLASTER CAST XFAST 3X15 (CAST SUPPLIES) IMPLANT
SPLINT PLASTER XTRA FASTSET 3X (CAST SUPPLIES)
STOCKINETTE 4X48 STRL (DRAPES) ×2 IMPLANT
SUT ETHIBOND 2 OS 4 DA (SUTURE) IMPLANT
SUT ETHIBOND 3-0 V-5 (SUTURE) IMPLANT
SUT ETHILON 4 0 PS 2 18 (SUTURE) ×4 IMPLANT
SUT FIBERWIRE 2-0 18 17.9 3/8 (SUTURE)
SUT FIBERWIRE 4-0 18 DIAM BLUE (SUTURE) ×2
SUT MERSILENE 4 0 P 3 (SUTURE) IMPLANT
SUT STEEL 3 0 (SUTURE) ×2 IMPLANT
SUT VIC AB 4-0 P-3 18XBRD (SUTURE) IMPLANT
SUT VIC AB 4-0 P2 18 (SUTURE) IMPLANT
SUT VIC AB 4-0 P3 18 (SUTURE)
SUTURE FIBERWR 2-0 18 17.9 3/8 (SUTURE) IMPLANT
SUTURE FIBERWR 4-0 18 DIA BLUE (SUTURE) ×1 IMPLANT
SYR BULB 3OZ (MISCELLANEOUS) ×2 IMPLANT
SYR CONTROL 10ML LL (SYRINGE) IMPLANT
TOWEL OR 17X24 6PK STRL BLUE (TOWEL DISPOSABLE) ×4 IMPLANT
TOWEL OR NON WOVEN STRL DISP B (DISPOSABLE) ×2 IMPLANT
UNDERPAD 30X30 (UNDERPADS AND DIAPERS) ×2 IMPLANT

## 2015-08-25 NOTE — Brief Op Note (Signed)
08/25/2015  11:03 AM  PATIENT:  Hassell Done  75 y.o. female  PRE-OPERATIVE DIAGNOSIS:  CARPOMETACARPAL ARTHRITIS LEFT THUMB  POST-OPERATIVE DIAGNOSIS:  CARPOMETACARPAL ARTHRITIS LEFT THUMB  PROCEDURE:  Procedure(s): SUSPENSIONPLASTY LEFT THUMB TRAPEZIUM EXCISION ABDUCTOR POLLICIS LONGUS TRANSFER (Left)  SURGEON:  Surgeon(s) and Role:    * Daryll Brod, MD - Primary  PHYSICIAN ASSISTANT:   ASSISTANTS: none   ANESTHESIA:   regional and general  EBL:  Total I/O In: 1500 [I.V.:1500] Out: -   BLOOD ADMINISTERED:none  DRAINS: none   LOCAL MEDICATIONS USED:  NONE  SPECIMEN:  No Specimen  DISPOSITION OF SPECIMEN:  N/A  COUNTS:  YES  TOURNIQUET:   Total Tourniquet Time Documented: Upper Arm (Left) - 79 minutes Total: Upper Arm (Left) - 79 minutes   DICTATION: .Other Dictation: Dictation Number [id number]  PLAN OF CARE: Discharge to home after PACU  PATIENT DISPOSITION:  PACU - hemodynamically stable.

## 2015-08-25 NOTE — Anesthesia Preprocedure Evaluation (Addendum)
Anesthesia Evaluation  Patient identified by MRN, date of birth, ID band Patient awake    Reviewed: Allergy & Precautions, NPO status , Patient's Chart, lab work & pertinent test results  History of Anesthesia Complications (+) PONV  Airway Mallampati: I  TM Distance: >3 FB Neck ROM: Full    Dental  (+) Teeth Intact, Dental Advisory Given   Pulmonary former smoker,    breath sounds clear to auscultation       Cardiovascular hypertension, Pt. on medications  Rhythm:Regular Rate:Normal     Neuro/Psych PSYCHIATRIC DISORDERS Anxiety    GI/Hepatic GERD  Medicated,  Endo/Other    Renal/GU      Musculoskeletal  (+) Arthritis ,   Abdominal   Peds  Hematology   Anesthesia Other Findings   Reproductive/Obstetrics                            Anesthesia Physical Anesthesia Plan  ASA: II  Anesthesia Plan: General   Post-op Pain Management: GA combined w/ Regional for post-op pain   Induction: Intravenous  Airway Management Planned: LMA  Additional Equipment:   Intra-op Plan:   Post-operative Plan: Extubation in OR  Informed Consent: I have reviewed the patients History and Physical, chart, labs and discussed the procedure including the risks, benefits and alternatives for the proposed anesthesia with the patient or authorized representative who has indicated his/her understanding and acceptance.   Dental advisory given  Plan Discussed with: CRNA, Anesthesiologist and Surgeon  Anesthesia Plan Comments:         Anesthesia Quick Evaluation

## 2015-08-25 NOTE — Progress Notes (Signed)
Assisted Dr. Al Corpus with left, ultrasound guided, infraclavicular block. Side rails up, monitors on throughout procedure. See vital signs in flow sheet. Tolerated Procedure well.

## 2015-08-25 NOTE — Anesthesia Procedure Notes (Signed)
Procedure Name: LMA Insertion Performed by: Juwann Sherk D Pre-anesthesia Checklist: Patient identified, Emergency Drugs available, Suction available and Patient being monitored Patient Re-evaluated:Patient Re-evaluated prior to inductionOxygen Delivery Method: Circle system utilized Preoxygenation: Pre-oxygenation with 100% oxygen Intubation Type: IV induction Ventilation: Mask ventilation without difficulty LMA: LMA inserted LMA Size: 4.0 Number of attempts: 1 Airway Equipment and Method: Bite block Placement Confirmation: positive ETCO2 Tube secured with: Tape Dental Injury: Teeth and Oropharynx as per pre-operative assessment

## 2015-08-25 NOTE — Addendum Note (Signed)
Addendum  created 08/25/15 1504 by Lorrene Reid, MD   Anesthesia Intra Blocks edited, Anesthesia Intra Meds edited, Sign clinical note

## 2015-08-25 NOTE — Op Note (Signed)
Dictation Number [id number]

## 2015-08-25 NOTE — Anesthesia Postprocedure Evaluation (Signed)
Anesthesia Post Note  Patient: AMARIA MUNDORF  Procedure(s) Performed: Procedure(s) (LRB): SUSPENSIONPLASTY LEFT THUMB TRAPEZIUM EXCISION ABDUCTOR POLLICIS LONGUS TRANSFER (Left)  Patient location during evaluation: PACU Anesthesia Type: General Level of consciousness: awake and alert Pain management: pain level controlled Vital Signs Assessment: post-procedure vital signs reviewed and stable Respiratory status: spontaneous breathing, nonlabored ventilation and respiratory function stable Cardiovascular status: blood pressure returned to baseline and stable Postop Assessment: no signs of nausea or vomiting Anesthetic complications: no    Last Vitals:  Vitals:   08/25/15 1130 08/25/15 1151  BP: (!) 114/47 (!) 124/56  Pulse: 86 70  Resp: (!) 29 16  Temp:  36.8 C    Last Pain:  Vitals:   08/25/15 1204  TempSrc:   PainSc: 0-No pain                 Gael Londo A

## 2015-08-25 NOTE — Transfer of Care (Signed)
Immediate Anesthesia Transfer of Care Note  Patient: SIMAR POTHIER  Procedure(s) Performed: Procedure(s): SUSPENSIONPLASTY LEFT THUMB TRAPEZIUM EXCISION ABDUCTOR POLLICIS LONGUS TRANSFER (Left)  Patient Location: PACU  Anesthesia Type:GA combined with regional for post-op pain  Level of Consciousness: awake and patient cooperative  Airway & Oxygen Therapy: Patient Spontanous Breathing and Patient connected to face mask oxygen  Post-op Assessment: Report given to RN and Post -op Vital signs reviewed and stable  Post vital signs: Reviewed and stable  Last Vitals:  Vitals:   08/25/15 0840 08/25/15 0845  BP:  (!) 108/53  Pulse: 68 66  Resp: 16 14  Temp:      Last Pain:  Vitals:   08/25/15 0754  TempSrc: Oral  PainSc: 3       Patients Stated Pain Goal: 1 (10/93/23 5573)  Complications: No apparent anesthesia complications

## 2015-08-25 NOTE — Discharge Instructions (Addendum)
[facility] Instructions Hand Surgery  Wound Care: Keep your hand elevated above the level of your heart.  Do not allow it to dangle by your side.  Keep the dressing dry and do not remove it unless your doctor advises you to do so.  He will usually change it at the time of your post-op visit.  Moving your fingers is advised to stimulate circulation but will depend on the site of your surgery.  If you have a splint applied, your doctor will advise you regarding movement.  Activity: Do not drive or operate machinery today.  Rest today and then you may return to your normal activity and work as indicated by your physician.  Diet:  Drink liquids today or eat a light diet.  You may resume a regular diet tomorrow.    General expectations: Pain for two to three days. Fingers may become slightly swollen.  Call your doctor if any of the following occur: Severe pain not relieved by pain medication. Elevated temperature. Dressing soaked with blood. Inability to move fingers. White or bluish color to fingers.    Regional Anesthesia Blocks  1. Numbness or the inability to move the "blocked" extremity may last from 3-48 hours after placement. The length of time depends on the medication injected and your individual response to the medication. If the numbness is not going away after 48 hours, call your surgeon.  2. The extremity that is blocked will need to be protected until the numbness is gone and the  Strength has returned. Because you cannot feel it, you will need to take extra care to avoid injury. Because it may be weak, you may have difficulty moving it or using it. You may not know what position it is in without looking at it while the block is in effect.  3. For blocks in the legs and feet, returning to weight bearing and walking needs to be done carefully. You will need to wait until the numbness is entirely gone and the strength has returned. You should be able to move your leg and foot  normally before you try and bear weight or walk. You will need someone to be with you when you first try to ensure you do not fall and possibly risk injury.  4. Bruising and tenderness at the needle site are common side effects and will resolve in a few days.  5. Persistent numbness or new problems with movement should be communicated to the surgeon or the [facility] [phone] [place] [phone]).       Post Anesthesia Home Care Instructions  Activity: Get plenty of rest for the remainder of the day. A responsible adult should stay with you for 24 hours following the procedure.  For the next 24 hours, DO NOT: -Drive a car -Paediatric nurse -Drink alcoholic beverages -Take any medication unless instructed by your physician -Make any legal decisions or sign important papers.  Meals: Start with liquid foods such as gelatin or soup. Progress to regular foods as tolerated. Avoid greasy, spicy, heavy foods. If nausea and/or vomiting occur, drink only clear liquids until the nausea and/or vomiting subsides. Call your physician if vomiting continues.  Special Instructions/Symptoms: Your throat may feel dry or sore from the anesthesia or the breathing tube placed in your throat during surgery. If this causes discomfort, gargle with warm salt water. The discomfort should disappear within 24 hours.  If you had a scopolamine patch placed behind your ear for the management  of post- operative nausea and/or vomiting:  1. The medication in the patch is effective for 72 hours, after which it should be removed.  Wrap patch in a tissue and discard in the trash. Wash hands thoroughly with soap and water. 2. You may remove the patch earlier than 72 hours if you experience unpleasant side effects which may include dry mouth, dizziness or visual disturbances. 3. Avoid touching the patch. Wash your hands with soap and water after contact with the patch.

## 2015-08-25 NOTE — Anesthesia Procedure Notes (Addendum)
Anesthesia Regional Block:  Infraclavicular brachial plexus block  Pre-Anesthetic Checklist: ,, timeout performed, Correct Patient, Correct Site, Correct Laterality, Correct Procedure, Correct Position, site marked, Risks and benefits discussed,  Surgical consent,  Pre-op evaluation,  At surgeon's request and post-op pain management  Laterality: Left  Prep: chloraprep       Needles:  Injection technique: Single-shot  Needle Type: Echogenic Stimulator Needle     Needle Length: 5cm 5 cm Needle Gauge: 21 and 21 G    Additional Needles:  Procedures: ultrasound guided (picture in chart) Infraclavicular brachial plexus block Narrative:  Start time: 08/25/2015 8:40 AM End time: 08/25/2015 8:45 AM Injection made incrementally with aspirations every 5 mL.  Performed by: Personally  Anesthesiologist: Gwenna Fuston

## 2015-08-25 NOTE — H&P (Signed)
Vanessa Day is an 75 y.o. female.   Chief Complaint: left thumb pain HPI: Vanessa Day is a 75 year old right hand dominant former patient who has not been seen in years. She comes in with complaint of her left hand in the [facility] area. She has undergone surgery on her right side in the past. She complains of an aching in nature with VAS score of 8/10. She is recently taking Tumeric and cherry extract . She had been on Celebrex, but has had bleeding problems. She has been on that for at least three years. She is complaining of increasing pain. She is scheduled to see someone for her neck and anticipates being placed on prednisone. She has diet controlled diabetes. No history of thyroid problems. She does have a history of arthritis, no discrete history of gout.She has undergone a suspensionplasty on her right side. She is also complaining of pain in her neck. She states that she has elected not to have anything done from an injection standpoint. She would like to simply proceed to surgical intervention      FAMILY HISTORY: Negative for diabetes, positive thyroid problems. Negative for arthritis, negative for gout.  She is not complaining on her right side. She states that is doing quite well. She has been taking tramadol for her neck.  Past Medical History:  Diagnosis Date  . Cancer ([place])  . Diabetes mellitus type II, controlled ([place])  . Hypertension   Past Surgical History:  Procedure Laterality Date  . APPENDECTOMY  . BACK SURGERY  . CHOLECYSTECTOMY  . HYSTERECTOMY       Past Medical History:  Diagnosis Date  . Anxiety   . Arthritis    back  . Cancer ([place]) 2000   rt breast cancer  . GERD (gastroesophageal reflux disease)   . H/O hiatal hernia   . Hyperlipidemia   . Hypertension   . IBS (irritable bowel syndrome)   . Neck pain   . PONV (postoperative nausea and vomiting)   . Pre-diabetes     Past Surgical History:  Procedure Laterality Date  . ABDOMINAL HYSTERECTOMY    .  APPENDECTOMY    . BACK SURGERY    . BREAST SURGERY    . CHOLECYSTECTOMY    . EYE SURGERY     cataract right and left eye  . GAS INSERTION  06/23/2011   Procedure: INSERTION OF GAS;  Surgeon: [place] Pedro, MD;  Location: [place];  Service: Ophthalmology;  Laterality: Left;  SF6  . MASTECTOMY     right breast  . SCLERAL BUCKLE  06/23/2011   Procedure: SCLERAL BUCKLE;  Surgeon: [place] Pedro, MD;  Location: [place];  Service: Ophthalmology;  Laterality: Left;  . TONSILLECTOMY      Family History  Problem Relation Age of Onset  . Anesthesia problems Neg Hx   . Hypotension Neg Hx   . Malignant hyperthermia Neg Hx   . Pseudochol deficiency Neg Hx    Social History:  reports that she quit smoking about 13 years ago. Her smoking use included Cigarettes. She has a 30.00 pack-year smoking history. She has never used smokeless tobacco. She reports that she drinks alcohol. She reports that she does not use drugs.  Allergies:  Allergies  Allergen Reactions  . Codeine Itching  . Statins Other (See Comments)    Muscle soreness  . Sulfa Antibiotics     Breakout around face    Medications Prior to Admission  Medication Sig Dispense Refill  .  alosetron (LOTRONEX) 0.5 MG tablet Take 0.5 mg by mouth 2 (two) times daily.    Marland Kitchen aspirin 81 MG tablet Take 81 mg by mouth daily.    . cetirizine (ZYRTEC) 10 MG tablet Take 10 mg by mouth daily as needed. For seasonal allergies    . ezetimibe (ZETIA) 10 MG tablet Take 10 mg by mouth daily.    . hydrochlorothiazide (HYDRODIURIL) 25 MG tablet Take 25 mg by mouth daily.    Marland Kitchen LORazepam (ATIVAN) 1 MG tablet Take 1 mg by mouth every 8 (eight) hours.    Marland Kitchen losartan (COZAAR) 50 MG tablet Take 50 mg by mouth daily.    . Misc Natural Products (TART CHERRY ADVANCED PO) Take by mouth.    . Omega-3 Fatty Acids (FISH OIL) 1000 MG CAPS Take by mouth.      No results found for this or any previous visit (from the past 48 hour(s)).  No results found.   Pertinent  items are noted in HPI.  Blood pressure 129/66, pulse 62, temperature 98.5 F (36.9 C), temperature source Oral, resp. rate 18, height '5\' 3"'$  (1.6 m), weight 59.9 kg (132 lb), SpO2 99 %.  General appearance: alert, cooperative and appears stated age Head: Normocephalic, without obvious abnormality Neck: no JVD Resp: clear to auscultation bilaterally Cardio: regular rate and rhythm, S1, S2 normal, no murmur, click, rub or gallop GI: soft, non-tender; bowel sounds normal; no masses,  no organomegaly Extremities: pain at base of left thumb Pulses: 2+ and symmetric Skin: Skin color, texture, turgor normal. No rashes or lesions Neurologic: Grossly normal Incision/Wound: na  Assessment/Plan Assessment:  1. Primary osteoarthritis of first carpometacarpal joint of left hand  2. Cervicalgia Ambulatory Referral To Orthopedic Surgery    Plan: She has elected to undergo surgical repair. We will have her see Dr. Lorin Mercy for her neck. She is scheduled for suspensionplasty left thumb, trapezium excision and APL transfer. Pre, peri and post op course are discussed along with risks and complications. She is aware there is no guarantee with surgery, possibility of infection, injury to arteries, nerves, tendons, incomplete relief of symptoms and dystrophy. She is scheduled for suspensionplasty left thumb as an outpatient under regional anesthesia. She will have Vicodin afterwards. She cannot take Percocet.       [name] 08/25/2015, 8:28 AM

## 2015-08-27 ENCOUNTER — Encounter (HOSPITAL_BASED_OUTPATIENT_CLINIC_OR_DEPARTMENT_OTHER): Payer: Self-pay | Admitting: Orthopedic Surgery

## 2015-08-28 NOTE — Op Note (Signed)
NAMETENISHIA, EKMAN NO.:  [id number]  MEDICAL RECORD NO.:  [id number]  LOCATION:       [facility]                          FACILITY:  PHYSICIAN:  Daryll Brod, M.D.       DATE OF BIRTH:  [birth year]  DATE OF PROCEDURE:  08/25/2015 DATE OF DISCHARGE:                              OPERATIVE REPORT   PREOPERATIVE DIAGNOSIS:  Carpometacarpal arthritis, left thumb.  POSTOPERATIVE DIAGNOSIS:  Carpometacarpal arthritis, left thumb.  OPERATION:  Suspensionplasty with excision trapezium and APL transfer, left thumb.  SURGEON:  Daryll Brod, MD.  ANESTHESIA:  Supraclavicular block, general.  ANESTHESIOLOGIST:  Lorrene Reid, M.D.  PLACE OF SURGERY:  [facility].  HISTORY:  The patient is a 75 year old female, with a history of CMC arthritis, pain in her left thumb, this has not responded to conservative treatment.  She has undergone suspensionplasty in the right side and is admitted now for left, this is being done at her request. Pre, peri, and postoperative course have been discussed along with risks and complications.  She is aware that there is no guarantee with the surgery; the possibility of infection; recurrence of injury to arteries, nerves, tendons; incomplete relief of symptoms; dystrophy.  In the preoperative area, the patient is seen, the extremity marked by both patient and surgeon.  Antibiotic was given.  PROCEDURE DESCRIPTION:  The patient was brought to the operating room, where a general anesthetic was carried out without difficulty.  She had been given a supraclavicular block in the preoperative area by Dr. Al Corpus.  She was prepped using ChloraPrep in a supine position with left arm free.  A 3-minute dry-time was allowed, a time-out taken confirming the patient and procedure.  The limb was exsanguinated with an Esmarch bandage.  Tourniquet was placed high and the arm inflated to 250 mmHg. A hockey-stick incision was then made over  the base of the thumb, at the insertion of the abductor pollicis longus tendon carried slightly dorsal.  The dissection carried down protecting the radial nerve. Bleeders were electrocauterized with bipolar.  The EPB tendon was identified.  The APL tendon was also identified.  An incision was then made between the two tendons.  The radial artery was identified proximally and protected.  The capsule was opened identifying the carpometacarpal joint, which was entirely arthritic.  The STT joint was then identified proximally and opened.  The periosteum was then elevated off from the trapezium using a Beaver blade and Estate manager/land agent. This was done circumferentially and the bone was removed in total.  The area was irrigated with saline.  The most dorsal slip of the abductor pollicis longus tendon was then isolated.  A separate incision was then made over the musculotendinous junction.  This was carried down through subcutaneous tissue.  Bleeders again were electrocauterized.  Radial branches were protected.  A second incision was made approximately halfway between and at the first dorsal compartment.  Retinaculum was extremely tight.  This allowed a Carroll tendon passer to be passed through the first dorsal compartment.  A 28-gauge monofilament wire was then used as a cheese cutter to separate off the dorsal one-third of  the abductor pollicis longus tendon.  The more proximal incision was then used to pass the [employer] and the cheese cutter wire was then delivered proximally to the musculotendinous junction. The abductor pollicis longus tendon was then transected at the musculotendinous junction and delivered distally.  The wounds were irrigated for harvesting the abductor pollicis longus and closed with interrupted 4-0 nylon sutures.  A drill hole was then placed in the proximal aspect of the thumb metacarpal base from a dorsal radial to volar ulnar position.  A second  drill hole was then placed through the base of the index metacarpal and a volar radial to ulnar dorsal position.  An incision was then made at its egress through the dorsal aspect of the index metacarpal.  This was taken down to the drill hole. A monofilament wire was then used to pass the abductor pollicis longus tendon through the base of the thumb metacarpal after irrigation.  This was then brought from the dorsal to palmar direction on the thumb and passed to the index metacarpal and a volar to dorsal transposition through the drill hole in the index metacarpal.  Dissection was then made along the metacarpal base of the index to the egress of the abductor pollicis tendon dorsally, and the tendon was brought back down into the cavity created by removal of the trapezium.  This was then passed around the abductor pollicis longus tendon where it went through the base of the index metacarpal with traction on the thumb.  This allowed the thumb to be well suspended and not touching onto the index metacarpal base.  This was sutured into position with multiple figure-of- eight 3-0 Ethibond sutures.  X-rays were taken confirming that the thumb was well suspended.  The wound was irrigated and closed in layers with 4- 0 Vicryl in the capsule, 4-0 Vicryl in the subcutaneous tissue, and 4-0 nylon in the skin.  The wound is made for passing the tendon on the dorsal aspect of the index metacarpal, it was also closed with interrupted 4-0 nylon sutures.  A sterile compressive dressing and thumb spica splint applied.  On deflation of the tourniquet, all fingers immediately pinked.  She was taken to the recovery room for observation in satisfactory condition.  She will be discharged home to return to the [facility] in 1 week, on Vicodin.    ______________________________ Daryll Brod, M.D.   ______________________________ Daryll Brod, M.D.    GK/MEDQ  D:  08/25/2015  T:  08/25/2015   Job:  [id number]

## 2015-10-14 DIAGNOSIS — M65332 Trigger finger, left middle finger: Secondary | ICD-10-CM | POA: Insufficient documentation

## 2015-12-11 ENCOUNTER — Other Ambulatory Visit: Payer: Self-pay | Admitting: Family Medicine

## 2015-12-11 DIAGNOSIS — Z1231 Encounter for screening mammogram for malignant neoplasm of breast: Secondary | ICD-10-CM

## 2016-01-06 ENCOUNTER — Ambulatory Visit
Admission: RE | Admit: 2016-01-06 | Discharge: 2016-01-06 | Disposition: A | Discharge status: Home / Self Care | Payer: Medicare Other | Source: Ambulatory Visit | Attending: Family Medicine | Admitting: Family Medicine

## 2016-01-06 DIAGNOSIS — Z1231 Encounter for screening mammogram for malignant neoplasm of breast: Secondary | ICD-10-CM

## 2016-05-04 ENCOUNTER — Ambulatory Visit (INDEPENDENT_AMBULATORY_CARE_PROVIDER_SITE_OTHER): Payer: Medicare Other | Admitting: Orthopaedic Surgery

## 2016-05-04 DIAGNOSIS — M25552 Pain in left hip: Secondary | ICD-10-CM | POA: Diagnosis not present

## 2016-05-04 DIAGNOSIS — M7062 Trochanteric bursitis, left hip: Secondary | ICD-10-CM

## 2016-05-04 MED ORDER — METHYLPREDNISOLONE ACETATE 40 MG/ML IJ SUSP
40.0000 mg | INTRAMUSCULAR | Status: AC | PRN
  Administered 2016-05-04: 40 mg via INTRA_ARTICULAR

## 2016-05-04 NOTE — Progress Notes (Signed)
Office Visit Note   Patient: Vanessa Day           Date of Birth: [birth year]           MRN: [id number] Visit Date: 05/04/2016              Requested by: [name], MD [address] [place], [place] [address] PCP: [name], MD   Assessment & Plan: Visit Diagnoses:  1. Pain of left hip joint   2. Trochanteric bursitis, left hip     Plan:  I do feel this is more of a trochanteric bursitis with IT band syndrome. She understands this as well. I showed her stretching exercises to try and she demonstrated these back to me. I was able to place a steroid injection in the trochanteric areas well. I like see her back in a month to see how she doing overall.  Follow-Up Instructions: Return in about 4 weeks (around 06/01/2016).   Orders:  No orders of the defined types were placed in this encounter.  No orders of the defined types were placed in this encounter.     Procedures: Large Joint Inj Date/Time: 05/04/2016 2:12 PM Performed by: Mcarthur Rossetti Authorized by: Mcarthur Rossetti   Location:  Hip Site:  L greater trochanter Ultrasound Guidance: No   Fluoroscopic Guidance: No   Arthrogram: No   Medications:  40 mg methylPREDNISolone acetate 40 MG/ML     Clinical Data: No additional findings.   Subjective: No chief complaint on file. The patient is a very pleasant active 76 year old female who is sent by Dr. Serita Grammes to evaluate left hip pain. She's had right hip pain in the past and known trochanteric bursitis her left hip is been hurting is been some in the groin and some over the lateral aspect of her hip. She had a fall [place] things have been aggravated. In the past for right hip injection and eventually physical therapy did help. She does report low back groin pain. She's had back surgery in the past. She denies any significant radicular symptoms. She denies any weakness in her legs and she is aware of.  HPI  Review of  [place] any headache, chest pain, short of breath, fever, chills, nausea, vomiting.  Objective: Vital Signs: There were no vitals taken for this visit.  Physical Exam He is alert and oriented 3 and gets up on the exam table easily. She does not walk with assistive device and does not have a limp. Ortho Exam examination of both hips shows fluid range of motion of both hips without any blocks to rotation and no pain in the groin or either side. Her left hip does have pain of the trochanteric area and it does go down the IT band. Her motor and sensory exam is entirely normal.  Specialty Comments:  No specialty comments available.  Imaging: No results found. X-rays of her pelvis and hip that accompany her and her independent reviewed by me. See well maintained hip joints. There is no significant arthritic findings of either hip joint and they're well located. There is no significant per trigger osteophytes or sclerotic changes.  PMFS History: Patient Active Problem List   Diagnosis Date Noted  . Trochanteric bursitis, left hip 05/04/2016  . 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 Anxiety   . Arthritis    back  . Cancer ([place]) 2000  rt breast cancer  . GERD (gastroesophageal reflux disease)   . H/O hiatal hernia   . Hyperlipidemia   . Hypertension   . IBS (irritable bowel syndrome)   . Neck pain   . PONV (postoperative nausea and vomiting)   . Pre-diabetes     Family History  Problem Relation Age of Onset  . Anesthesia problems Neg Hx   . Hypotension Neg Hx   . Malignant hyperthermia Neg Hx   . Pseudochol deficiency Neg Hx     Past Surgical History:  Procedure Laterality Date  . ABDOMINAL HYSTERECTOMY    . APPENDECTOMY    . BACK SURGERY    . BREAST SURGERY    . CARPOMETACARPEL SUSPENSION PLASTY Left 08/25/2015   Procedure: SUSPENSIONPLASTY LEFT THUMB TRAPEZIUM EXCISION ABDUCTOR POLLICIS LONGUS  TRANSFER;  Surgeon: Daryll Brod, MD;  Location: [place];  Service: Orthopedics;  Laterality: Left;  . CHOLECYSTECTOMY    . EYE SURGERY     cataract right and left eye  . GAS INSERTION  06/23/2011   Procedure: INSERTION OF GAS;  Surgeon: Hayden Pedro, MD;  Location: [place];  Service: Ophthalmology;  Laterality: Left;  SF6  . MASTECTOMY     right breast  . SCLERAL BUCKLE  06/23/2011   Procedure: SCLERAL BUCKLE;  Surgeon: Hayden Pedro, MD;  Location: [place];  Service: Ophthalmology;  Laterality: Left;  . TONSILLECTOMY     Social History   Occupational History  . Not on file.   Social History Main Topics  . Smoking status: Former Smoker    Packs/day: 1.00    Years: 30.00    Types: Cigarettes    Quit date: 06/22/2002  . Smokeless tobacco: Never Used  . Alcohol use Yes     Comment: rarely  . Drug use: No  . Sexual activity: Not Currently

## 2016-05-09 ENCOUNTER — Telehealth (INDEPENDENT_AMBULATORY_CARE_PROVIDER_SITE_OTHER): Payer: Self-pay | Admitting: Orthopaedic Surgery

## 2016-05-09 MED ORDER — METHYLPREDNISOLONE 4 MG PO TABS: ORAL_TABLET | ORAL | 0 refills | Status: DC

## 2016-05-09 NOTE — Telephone Encounter (Signed)
Please advise 

## 2016-05-09 NOTE — Telephone Encounter (Signed)
PT WANTS TO INQUIRE ABOUT PHYS THERAPY, SHE STATED SHE IS STILL IN PAIN AND ALSO WANTED TO SEE ABOUT GETTING PREDNISONE OR SOMETHING LIKE THAT.  [phone] OK TO TALK TO HUSBAND OR LVM

## 2016-05-09 NOTE — Telephone Encounter (Signed)
I sent in a steroid taper to her pharmacy.  Also, when we are back in [place], fill out a [facility] referral slip for them to work on her trochanteric bursitis.  Remind me when I'm over there.

## 2016-05-09 NOTE — Telephone Encounter (Signed)
LMOM for patient that we called Rx in and sent order to [facility], they will call patient and schedule appt

## 2016-05-17 ENCOUNTER — Ambulatory Visit (INDEPENDENT_AMBULATORY_CARE_PROVIDER_SITE_OTHER): Payer: Medicare Other | Admitting: Orthopaedic Surgery

## 2016-06-01 ENCOUNTER — Ambulatory Visit (INDEPENDENT_AMBULATORY_CARE_PROVIDER_SITE_OTHER): Payer: Medicare Other | Admitting: Orthopaedic Surgery

## 2016-06-01 ENCOUNTER — Encounter (INDEPENDENT_AMBULATORY_CARE_PROVIDER_SITE_OTHER): Payer: Self-pay | Admitting: Orthopaedic Surgery

## 2016-06-01 VITALS — Ht 63.0 in | Wt 132.0 lb

## 2016-06-01 DIAGNOSIS — M7062 Trochanteric bursitis, left hip: Secondary | ICD-10-CM

## 2016-06-01 MED ORDER — LIDOCAINE HCL 1 % IJ SOLN
3.0000 mL | INTRAMUSCULAR | Status: AC | PRN
  Administered 2016-06-01: 3 mL

## 2016-06-01 MED ORDER — METHYLPREDNISOLONE ACETATE 40 MG/ML IJ SUSP
40.0000 mg | INTRAMUSCULAR | Status: AC | PRN
  Administered 2016-06-01: 40 mg via INTRA_ARTICULAR

## 2016-06-01 NOTE — Progress Notes (Signed)
Office Visit Note   Patient: Vanessa Day           Date of Birth: 1941/01/07           MRN: [id number] Visit Date: 06/01/2016              Requested by: [name], MD [address] [place], [place] [address] PCP: [name], MD   Assessment & Plan: Visit Diagnoses:  1. Trochanteric bursitis, left hip     Plan: She tolerated understands that this is a chronic process that may take a while to get over and there is no surgical intervention I would recommend for now. I did try another steroid injection. She'll continue home exercise program and physical therapy.  Follow-Up Instructions: Return in about 3 months (around 09/01/2016).   Orders:  Orders Placed This Encounter  Procedures  . Large Joint Injection/Arthrocentesis   No orders of the defined types were placed in this encounter.     Procedures: Large Joint Inj Date/Time: 06/01/2016 10:10 AM Performed by: Mcarthur Rossetti Authorized by: Mcarthur Rossetti   Location:  Hip Site:  L greater trochanter Ultrasound Guidance: No   Fluoroscopic Guidance: No   Arthrogram: No   Medications:  3 mL lidocaine 1 %; 40 mg methylPREDNISolone acetate 40 MG/ML     Clinical Data: No additional findings.   Subjective: Chief Complaint  Patient presents with  . Left Hip - Follow-up    Left hip greater trochanteric bursitis. S.p injection 05/04/16. Doing well. She is doing physical therapy. She complains of difficulty lifting leg while laying down.    The patient comes in today for follow-up of her trochanteric bursitis of her left hip. Is been very frustrating for her. She is going to physical therapy now and the physical therapy note saying she is making improvements but she is really not sure. She still denies any pain in the groin. HPI  Review of Systems She denies any fever, chills, nausea, vomiting  Objective: Vital Signs: Ht '5\' 3"'$  (1.6 m)   Wt 132 lb (59.9 kg)   BMI 23.38 kg/m    Physical Exam He is alert or 3 in no acute distress Ortho Exam Exam is for left hip shows fluid internal/external rotation with no pain in the groin or blocks rotation all. Her pain is only over trochanteric area. Specialty Comments:  No specialty comments available.  Imaging: No results found.   PMFS History: Patient Active Problem List   Diagnosis Date Noted  . Trochanteric bursitis, left hip 05/04/2016  . 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 Anxiety   . Arthritis    back  . Cancer ([place]) 2000   rt breast cancer  . GERD (gastroesophageal reflux disease)   . H/O hiatal hernia   . Hyperlipidemia   . Hypertension   . IBS (irritable bowel syndrome)   . Neck pain   . PONV (postoperative nausea and vomiting)   . Pre-diabetes     Family History  Problem Relation Age of Onset  . Anesthesia problems Neg Hx   . Hypotension Neg Hx   . Malignant hyperthermia Neg Hx   . Pseudochol deficiency Neg Hx     Past Surgical History:  Procedure Laterality Date  . ABDOMINAL HYSTERECTOMY    . APPENDECTOMY    . BACK SURGERY    . BREAST SURGERY    . CARPOMETACARPEL SUSPENSION  PLASTY Left 08/25/2015   Procedure: SUSPENSIONPLASTY LEFT THUMB TRAPEZIUM EXCISION ABDUCTOR POLLICIS LONGUS TRANSFER;  Surgeon: Daryll Brod, MD;  Location: [place];  Service: Orthopedics;  Laterality: Left;  . CHOLECYSTECTOMY    . EYE SURGERY     cataract right and left eye  . GAS INSERTION  06/23/2011   Procedure: INSERTION OF GAS;  Surgeon: [place] Pedro, MD;  Location: [place];  Service: Ophthalmology;  Laterality: Left;  SF6  . MASTECTOMY     right breast  . SCLERAL BUCKLE  06/23/2011   Procedure: SCLERAL BUCKLE;  Surgeon: Hayden Pedro, MD;  Location: [place];  Service: Ophthalmology;  Laterality: Left;  . TONSILLECTOMY     Social History   Occupational History  . Not on file.   Social History Main Topics  . Smoking  status: Former Smoker    Packs/day: 1.00    Years: 30.00    Types: Cigarettes    Quit date: 06/22/2002  . Smokeless tobacco: Never Used  . Alcohol use Yes     Comment: rarely  . Drug use: No  . Sexual activity: Not Currently

## 2016-07-18 ENCOUNTER — Ambulatory Visit (INDEPENDENT_AMBULATORY_CARE_PROVIDER_SITE_OTHER): Payer: Medicare Other | Admitting: Orthopaedic Surgery

## 2016-07-18 DIAGNOSIS — M25561 Pain in right knee: Secondary | ICD-10-CM | POA: Diagnosis not present

## 2016-07-18 DIAGNOSIS — M7061 Trochanteric bursitis, right hip: Secondary | ICD-10-CM

## 2016-07-18 MED ORDER — LIDOCAINE HCL 1 % IJ SOLN
3.0000 mL | INTRAMUSCULAR | Status: AC | PRN
  Administered 2016-07-18: 3 mL

## 2016-07-18 MED ORDER — METHYLPREDNISOLONE ACETATE 40 MG/ML IJ SUSP
40.0000 mg | INTRAMUSCULAR | Status: AC | PRN
  Administered 2016-07-18: 40 mg via INTRA_ARTICULAR

## 2016-07-18 NOTE — Progress Notes (Signed)
Office Visit Note   Patient: Vanessa Day           Date of Birth: [birth year]           MRN: [id number] Visit Date: 07/18/2016              Requested by: [name], MD [address] [place], [place] [address] PCP: [name], MD   Assessment & Plan: Visit Diagnoses:  1. Acute pain of right knee   2. Trochanteric bursitis, right hip     Plan:She would like both her right hip trochanteric injection in her right knee injection and I agree with this and I've also recommended these things for her. I showed her stretching exercises try as was quad strengthening exercises to try. We talked about the risks and benefits injections as well as exercises. All questions were encouraged and answered. She tolerated both injections well. She'll follow-up as needed.  Follow-Up Instructions: Return in about 4 weeks (around 08/15/2016).   Orders:  Orders Placed This Encounter  Procedures  . Large Joint Injection/Arthrocentesis  . Large Joint Injection/Arthrocentesis   No orders of the defined types were placed in this encounter.     Procedures: Large Joint Inj Date/Time: 07/18/2016 2:46 PM Performed by: Mcarthur Rossetti Authorized by: Jean Rosenthal Y   Location:  Knee Site:  R knee Ultrasound Guidance: No   Fluoroscopic Guidance: No   Arthrogram: No   Medications:  3 mL lidocaine 1 %; 40 mg methylPREDNISolone acetate 40 MG/ML Large Joint Inj Date/Time: 07/18/2016 2:46 PM Performed by: Mcarthur Rossetti Authorized by: Mcarthur Rossetti   Location:  Hip Site:  R greater trochanter Ultrasound Guidance: No   Fluoroscopic Guidance: No   Arthrogram: No   Medications:  3 mL lidocaine 1 %; 40 mg methylPREDNISolone acetate 40 MG/ML     Clinical Data: No additional findings.   Subjective: No chief complaint on file. Patient well-known to me. We've injected her left hip due to trochanteric bursitis now she is having right hip pain  and right knee pain. She points her knee joint in general as well as the trochanteric area right side. She said her left hip trochanteric injection we did back in a has done excellent. That was early May. She denies any locking catching in her right knee has been painful since having a hard time getting available before recently. She says is a global pain in her knee. The trochanteric area is been hurting her for a little while as well. She feels that this may be coming from taking pressure off her other side but again the left side is done excellent for her. She denies any change in bowel or bladder function or groin pain on the right side.  HPI  Review of Systems He denies any fever, chills, nausea, vomiting, headache, shortness of breath, fever, chest pain  Objective: Vital Signs: There were no vitals taken for this visit.  Physical Exam She is alert or 3 in no acute distress Ortho Exam Examination of her right hip shows pain over trochanteric area of the hip only. There is definitely going her range of motion is excellent. Examination of her right knee shows no effusion but has slight medial joint line tenderness and some patellofemoral crepitation. The knee feels ligamentously stable. Specialty Comments:  No specialty comments available.  Imaging: No results found.   PMFS History: Patient Active Problem List   Diagnosis Date Noted  . Acute pain of  right knee 07/18/2016  . Trochanteric bursitis, right hip 07/18/2016  . Trochanteric bursitis, left hip 05/04/2016  . 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 Anxiety   . Arthritis    back  . Cancer ([facility]) 2000   rt breast cancer  . GERD (gastroesophageal reflux disease)   . H/O hiatal hernia   . Hyperlipidemia   . Hypertension   . IBS (irritable bowel syndrome)   . Neck pain   . PONV (postoperative nausea and vomiting)   . Pre-diabetes     Family History    Problem Relation Age of Onset  . Anesthesia problems Neg Hx   . Hypotension Neg Hx   . Malignant hyperthermia Neg Hx   . Pseudochol deficiency Neg Hx     Past Surgical History:  Procedure Laterality Date  . ABDOMINAL HYSTERECTOMY    . APPENDECTOMY    . BACK SURGERY    . BREAST SURGERY    . CARPOMETACARPEL SUSPENSION PLASTY Left 08/25/2015   Procedure: SUSPENSIONPLASTY LEFT THUMB TRAPEZIUM EXCISION ABDUCTOR POLLICIS LONGUS TRANSFER;  Surgeon: Daryll Brod, MD;  Location: [facility];  Service: Orthopedics;  Laterality: Left;  . CHOLECYSTECTOMY    . EYE SURGERY     cataract right and left eye  . GAS INSERTION  06/23/2011   Procedure: INSERTION OF GAS;  Surgeon: Hayden Pedro, MD;  Location: [facility];  Service: Ophthalmology;  Laterality: Left;  SF6  . MASTECTOMY     right breast  . SCLERAL BUCKLE  06/23/2011   Procedure: SCLERAL BUCKLE;  Surgeon: Hayden Pedro, MD;  Location: [facility];  Service: Ophthalmology;  Laterality: Left;  . TONSILLECTOMY     Social History   Occupational History  . Not on file.   Social History Main Topics  . Smoking status: Former Smoker    Packs/day: 1.00    Years: 30.00    Types: Cigarettes    Quit date: 06/22/2002  . Smokeless tobacco: Never Used  . Alcohol use Yes     Comment: rarely  . Drug use: No  . Sexual activity: Not Currently

## 2016-08-31 ENCOUNTER — Ambulatory Visit (INDEPENDENT_AMBULATORY_CARE_PROVIDER_SITE_OTHER): Payer: Medicare Other | Admitting: Orthopaedic Surgery

## 2016-11-30 ENCOUNTER — Other Ambulatory Visit: Payer: Self-pay | Admitting: Family Medicine

## 2016-11-30 DIAGNOSIS — Z1231 Encounter for screening mammogram for malignant neoplasm of breast: Secondary | ICD-10-CM

## 2016-12-09 ENCOUNTER — Telehealth: Payer: Self-pay | Admitting: Genetic Counselor

## 2016-12-09 ENCOUNTER — Encounter: Payer: Self-pay | Admitting: Genetic Counselor

## 2016-12-09 NOTE — Telephone Encounter (Signed)
Genetic counseling appt has been scheduled for the pt to see Santiago Glad on 02/13/17 at [facility]. Letter mailed to the pt.

## 2017-01-09 ENCOUNTER — Ambulatory Visit
Admission: RE | Admit: 2017-01-09 | Discharge: 2017-01-09 | Disposition: A | Discharge status: Home / Self Care | Payer: Medicare Other | Source: Ambulatory Visit | Attending: Family Medicine | Admitting: Family Medicine

## 2017-01-09 DIAGNOSIS — Z1231 Encounter for screening mammogram for malignant neoplasm of breast: Secondary | ICD-10-CM

## 2017-02-13 ENCOUNTER — Encounter: Payer: Self-pay | Admitting: Genetic Counselor

## 2017-02-13 ENCOUNTER — Inpatient Hospital Stay: Payer: Medicare Other

## 2017-02-13 ENCOUNTER — Inpatient Hospital Stay: Payer: Medicare Other | Attending: Genetic Counselor | Admitting: Genetic Counselor

## 2017-02-13 DIAGNOSIS — Z853 Personal history of malignant neoplasm of breast: Secondary | ICD-10-CM | POA: Diagnosis not present

## 2017-02-13 DIAGNOSIS — Z803 Family history of malignant neoplasm of breast: Secondary | ICD-10-CM

## 2017-02-13 NOTE — Progress Notes (Signed)
REFERRING PROVIDER: [name], MD [address] [place], [place] [address]  PRIMARY PROVIDER:  [name], MD  PRIMARY REASON FOR VISIT:  1. Family history of breast cancer   2. History of breast cancer      HISTORY OF PRESENT ILLNESS:   Vanessa Day, a 77 y.o. female, was seen for a [facility] cancer genetics consultation at the request of Dr. Brigitte Pulse due to a personal and family history of cancer.  Vanessa Day presents to clinic today to discuss the possibility of a hereditary predisposition to cancer, genetic testing, and to further clarify her future cancer risks, as well as potential cancer risks for family members.   In 1994, at the age of 71, Vanessa Day was diagnosed with breast cancer. This was treated with a unilateral mastectomy.  At that time she had BRCA testing which was reportedly negative.  She did not have chemotherapy, radiation or tamoxifen.  She has had a couple polyps on her colonoscopy, but all other cancer screening has been negative.    CANCER HISTORY:   No history exists.     HORMONAL RISK FACTORS:  Menarche was at age 62-12.  First live birth at age 88.  OCP use for approximately 5 years or less years.  Ovaries intact: no.  Hysterectomy: yes.  Menopausal status: postmenopausal.  HRT use: 10-15 years. Colonoscopy: yes; 2 polyps. Mammogram within the last year: yes. Number of breast biopsies: 1. Up to date with pelvic exams:  no. Any excessive radiation exposure in the past:  no  Past Medical History:  Diagnosis Date  . Anxiety   . Arthritis    back  . Cancer ([facility]) 2000   rt breast cancer  . Family history of breast cancer   . GERD (gastroesophageal reflux disease)   . H/O hiatal hernia   . Hyperlipidemia   . Hypertension   . IBS (irritable bowel syndrome)   . Neck pain   . PONV (postoperative nausea and vomiting)   . Pre-diabetes     Past Surgical History:  Procedure Laterality Date  . ABDOMINAL HYSTERECTOMY    .  APPENDECTOMY    . BACK SURGERY    . BREAST SURGERY    . CARPOMETACARPEL SUSPENSION PLASTY Left 08/25/2015   Procedure: SUSPENSIONPLASTY LEFT THUMB TRAPEZIUM EXCISION ABDUCTOR POLLICIS LONGUS TRANSFER;  Surgeon: Daryll Brod, MD;  Location: [facility];  Service: Orthopedics;  Laterality: Left;  . CHOLECYSTECTOMY    . EYE SURGERY     cataract right and left eye  . GAS INSERTION  06/23/2011   Procedure: INSERTION OF GAS;  Surgeon: Hayden Pedro, MD;  Location: [facility];  Service: Ophthalmology;  Laterality: Left;  SF6  . MASTECTOMY     right breast  . SCLERAL BUCKLE  06/23/2011   Procedure: SCLERAL BUCKLE;  Surgeon: Hayden Pedro, MD;  Location: [facility];  Service: Ophthalmology;  Laterality: Left;  . TONSILLECTOMY      Social History   Socioeconomic History  . Marital status: Married    Spouse name: Not on file  . Number of children: Not on file  . Years of education: Not on file  . Highest education level: Not on file  Social Needs  . Financial resource strain: Not on file  . Food insecurity - worry: Not on file  . Food insecurity - inability: Not on file  . Transportation needs - medical: Not on file  . Transportation needs - non-medical: Not on file  Occupational History  . Not on file  Tobacco Use  . Smoking status: Former Smoker    Packs/day: 1.00    Years: 30.00    Pack years: 30.00    Types: Cigarettes    Last attempt to quit: 06/22/2002    Years since quitting: 14.6  . Smokeless tobacco: Never Used  Substance and Sexual Activity  . Alcohol use: Yes    Comment: rarely  . Drug use: No  . Sexual activity: Not Currently  Other Topics Concern  . Not on file  Social History Narrative  . Not on file     FAMILY HISTORY:  We obtained a detailed, 4-generation family history.  Significant diagnoses are listed below: Family History  Problem Relation Age of Onset  . Breast cancer Mother        dx over 77  . Prostate cancer Father        dx late 2s-early 70s   . COPD Father   . Cerebral palsy Brother   . Lung cancer Maternal Uncle        smoker  . Stroke Maternal Grandfather   . Cancer Cousin        NOS  . Anesthesia problems Neg Hx   . Hypotension Neg Hx   . Malignant hyperthermia Neg Hx   . Pseudochol deficiency Neg Hx     The patient has a son and daughter.  Her daughter developed breast cancer at 21 and died at 23.  She had two brothers.  One brother had cerebral palsy and died at 53.  The other brother is healthy.  Both parents are deceased.  The patient's father had prostate cancer in his late 33s to early 86s.  He had one sister and two brothers who were cancer free.  His parents are both deceased.  The patient's mother had breast cancer over 67 and died at 72.  She had two sisters and three brothers.  One brother had lung cancer from smoking and another brother has a daughter who has cancer.  Both maternal grandparents are deceased.  Vanessa Day is unaware of previous family history of genetic testing for hereditary cancer risks. Patient's maternal ancestors are of [place] descent, and paternal ancestors are of English descent. There is no reported Ashkenazi Jewish ancestry. There is no known consanguinity.  GENETIC COUNSELING ASSESSMENT: Vanessa Day is a 77 y.o. female with a personal and family history of breast cancer which is somewhat suggestive of a hereditary breast cancer syndrome and predisposition to cancer. We, therefore, discussed and recommended the following at today's visit.   DISCUSSION: We discussed that about 5-10% of breast cancer is hereditary with most cases due to BRCA mutations.  The patient was tested for BRCA in the past, and was reportedly negative.  We discussed that technology has improved over time and we are able to detect additional mutations that were missed in the past.  There are also other genes that are associated with hereditary breast cancer including ATM, CHEK2 and PALB2.  We reviewed the  characteristics, features and inheritance patterns of hereditary cancer syndromes. We also discussed genetic testing, including the appropriate family members to test, the process of testing, insurance coverage and turn-around-time for results. We discussed the implications of a negative, positive and/or variant of uncertain significant result. We recommended Vanessa Day pursue genetic testing for the Common hereditary cancer gene panel. The Hereditary Gene Panel offered by [facility] includes sequencing and/or deletion duplication testing of the following 47 genes:  APC, ATM, AXIN2, BARD1, BMPR1A, BRCA1, BRCA2, BRIP1, CDH1, CDK4, CDKN2A (p14ARF), CDKN2A (p16INK4a), CHEK2, CTNNA1, DICER1, EPCAM (Deletion/duplication testing only), GREM1 (promoter region deletion/duplication testing only), KIT, MEN1, MLH1, MSH2, MSH3, MSH6, MUTYH, NBN, NF1, NHTL1, PALB2, PDGFRA, PMS2, POLD1, POLE, PTEN, RAD50, RAD51C, RAD51D, SDHB, SDHC, SDHD, SMAD4, SMARCA4. STK11, TP53, TSC1, TSC2, and VHL.  The following genes were evaluated for sequence changes only: SDHA and HOXB13 c.251G>A variant only.   Based on Vanessa Day's personal and family history of cancer, she meets medical criteria for genetic testing. Despite that she meets criteria, she may still have an out of pocket cost. We discussed that if her out of pocket cost for testing is over $100, the laboratory will call and confirm whether she wants to proceed with testing.  If the out of pocket cost of testing is less than $100 she will be billed by the genetic testing laboratory.   PLAN: After considering the risks, benefits, and limitations, Vanessa Day  provided informed consent to pursue genetic testing and the blood sample was sent to [facility] for analysis of the Common hereditary cancer panel. Results should be available within approximately 2-3 weeks' time, at which point they will be disclosed by telephone to Vanessa Day, as will any additional recommendations  warranted by these results. Vanessa Day will receive a summary of her genetic counseling visit and a copy of her results once available. This information will also be available in Epic. We encouraged Vanessa Day to remain in contact with cancer genetics annually so that we can continuously update the family history and inform her of any changes in cancer genetics and testing that may be of benefit for her family. Vanessa Day's questions were answered to her satisfaction today. Our contact information was provided should additional questions or concerns arise.  Lastly, we encouraged Vanessa Day to remain in contact with cancer genetics annually so that we can continuously update the family history and inform her of any changes in cancer genetics and testing that may be of benefit for this family.   Ms.  Day's questions were answered to her satisfaction today. Our contact information was provided should additional questions or concerns arise. Thank you for the referral and allowing [place] to share in the care of your patient.   Festus Pursel P. Florene Glen, [place], [facility] Certified Genetic Counselor [email or web address] phone: [phone]  The patient was seen for a total of 45 minutes in face-to-face genetic counseling.  This patient was discussed with Drs. Magrinat, Lindi Adie and/or Burr Medico who agrees with the above.    _______________________________________________________________________ For Office Staff:  Number of people involved in session: 1 Was an Intern/ student involved with case: no

## 2017-02-22 ENCOUNTER — Telehealth: Payer: Self-pay | Admitting: Genetic Counselor

## 2017-02-22 ENCOUNTER — Encounter: Payer: Self-pay | Admitting: Genetic Counselor

## 2017-02-22 DIAGNOSIS — Z1379 Encounter for other screening for genetic and chromosomal anomalies: Secondary | ICD-10-CM | POA: Insufficient documentation

## 2017-02-22 NOTE — Telephone Encounter (Signed)
LM on VM that results were back and to please CB so we can go through them.

## 2017-03-06 ENCOUNTER — Inpatient Hospital Stay: Payer: Medicare Other | Attending: Genetic Counselor | Admitting: Genetic Counselor

## 2017-03-06 ENCOUNTER — Inpatient Hospital Stay: Payer: Medicare Other

## 2017-03-06 DIAGNOSIS — Z803 Family history of malignant neoplasm of breast: Secondary | ICD-10-CM | POA: Diagnosis not present

## 2017-03-06 DIAGNOSIS — Z809 Family history of malignant neoplasm, unspecified: Secondary | ICD-10-CM | POA: Diagnosis not present

## 2017-03-06 DIAGNOSIS — Z1379 Encounter for other screening for genetic and chromosomal anomalies: Secondary | ICD-10-CM | POA: Diagnosis not present

## 2017-03-06 DIAGNOSIS — Z8042 Family history of malignant neoplasm of prostate: Secondary | ICD-10-CM | POA: Diagnosis not present

## 2017-03-06 DIAGNOSIS — Z315 Encounter for genetic counseling: Secondary | ICD-10-CM

## 2017-03-06 DIAGNOSIS — Z853 Personal history of malignant neoplasm of breast: Secondary | ICD-10-CM

## 2017-03-06 DIAGNOSIS — Z801 Family history of malignant neoplasm of trachea, bronchus and lung: Secondary | ICD-10-CM

## 2017-03-06 NOTE — Progress Notes (Signed)
GENETIC TEST RESULTS   Patient Name: Vanessa Day Patient Age: 77 y.o. Encounter Date: 03/06/2017  Referring Provider: [name], MD    Vanessa Day was seen in the [facility] clinic on March 06, 2017 due to a personal and family history of cancer and concern regarding a hereditary predisposition to cancer in the family. Please refer to the prior Genetics clinic note for more information regarding Vanessa Day's medical and family histories and our assessment at the time.   FAMILY HISTORY:  We obtained a detailed, 4-generation family history.  Significant diagnoses are listed below: Family History  Problem Relation Age of Onset  . Breast cancer Mother        dx over 57  . Prostate cancer Father        dx late 41s-early 70s  . COPD Father   . Cerebral palsy Brother   . Lung cancer Maternal Uncle        smoker  . Stroke Maternal Grandfather   . Cancer Cousin        NOS  . Breast cancer Daughter 54  . Anesthesia problems Neg Hx   . Hypotension Neg Hx   . Malignant hyperthermia Neg Hx   . Pseudochol deficiency Neg Hx     The patient has a son and daughter.  Her daughter developed breast cancer at 74 and died at 54.  She had two brothers.  One brother had cerebral palsy and died at 13.  The other brother is healthy.  Both parents are deceased.  The patient's father had prostate cancer in his late 70's to early 82's.  He had one sister and two brothers who were cancer free.  His parents are both deceased.  The patient's mother had breast cancer over 77 and died at 49.  She had two sisters and three brothers.  One brother had lung cancer from smoking and another brother has a daughter who has cancer.  Both maternal grandparents are deceased.  Vanessa Day is unaware of previous family history of genetic testing for hereditary cancer risks. Patient's maternal ancestors are of [place] descent, and paternal ancestors are of English descent. There is no reported  Ashkenazi Jewish ancestry. There is no known consanguinity.  GENETIC TESTING:  At the time of Vanessa Day's visit, we recommended she pursue genetic testing of the common hereditary cancer panel. The genetic testing, reporting on February 21, 2017 through the Common Hereditary Cancer Panel offered by [facility] identified a single, heterozygous pathogenic gene mutation called CHEK2, c.1100delC. There were no deleterious mutations in APC, ATM, AXIN2, BARD1, BMPR1A, BRCA1, BRCA2, BRIP1, CDH1, CDK4, CDKN2A, DICER1, EPCAM, GREM1, KIT, MEN1, MLH1, MSH2, MSH6, MUTYH, NBN, NF1, PALB2, PDGFRA, PMS2, POLD1, POLE, PTEN, RAD50, RAD51C, RAD51D, SDHA, SDHB, SDHC, SDHD, SMAD4, SMARCA4. STK11, TP53, TSC1, TSC2, and VHL.     DISCUSSION: CHEK2 mutations have been found to be associated with an increased risk of breast and other cancers. The estimated cancer risks vary widely and may be influenced by family history. Women with a CHEK2 deleterious mutation have approximately a 24% (no family history of breast cancer) to 48% (strong family history of breast cancer) lifetime risk of breast cancer and up to a 25% risk of a second breast cancer. Men may have an increased risk for female breast cancer of about 1%. Men and women may have an increased risk of colon cancer (~10% lifetime risk). According to the NCCN guidelines, individuals with CHEK2 mutations should consider breast MRI's as a part  of regular breast cancer screening, and depending on family history could consider a risk-reducing mastectomy.  Breast cancer screening should begin, for women, at age 35 or 35 years younger than the earliest age of onset.  Colon cancer screening should begin at age 65 and continue every 5 years or based on polyp number.    CANCER SCREENING: Below are the NCCN Practice guidelines for women and men.  However, because the breast cancer risks for women and prostate cancer risks for men may be similar, it is appropriate to consider these high  risk management recommendations.  Breast Management Options We reviewed the NCCN practice guidelines (v1.2019) for breast management for women at an increased risk of breast cancer because of CHEK2 mutations:   1. Breast awareness (which may include periodic, consistent breast self exam) starting at age 23.  2. Breast screening:  . Starting at age 45, annual mammogram and breast MRI screening, or starting 10 years younger than the earliest age of onset.   Colon Cancer Management:  Men and women with a deleterious CHEK2 mutation may have up to a 10% lifetime risk for colon cancer. The following is recommended for individuals with a CHEK2 mutation:  Personal history of colon cancer  Follow instructions provided by your physician based on your personal history.  Do not have a personal history of colon cancer but have a parent/sibling/child with colon cancer: Colonoscopy every 5 years starting at age 65 or 41 years younger than the earliest age of onset, whichever is younger.  Do not have a personal history of colon cancer but do not have a parent/sibling/child with colon cancer: Colonoscopy every 5 years starting at age 60.   FAMILY MEMBERS: It is important that all of Vanessa Day's relatives (both men and women) know of the presence of this gene mutation.  Women need to know that they may be at increased risk for breast and colon cancers.  Men are at slightly increased risk for breast, prostate and colon cancers.  Genetic testing can sort out who in your family is at risk and who is not.  We would be happy to help meet with and coordinate genetic testing for any relative that is interested.  Vanessa Day's son and brother are at 50% risk to have inherited the mutation found in her. We recommend they have genetic testing for this same mutation, as identifying the presence of this mutation would allow them to also take advantage of risk-reducing measures. Her son is attending today's visit with  her, as are her daughter's two children.  Our knowledge of cancer risks related to CHEK2 mutations will continue to evolve. We recommended that Vanessa Day follow up with the genetics clinic annually so we can provide her with the most current information about CHEK2 and cancer risk, as well as with any changes to her family history (new cancer diagnoses, genetic test results).  SUPPORT AND RESOURCES:  We provided information about two support groups for hereditary cancer syndrome information and support, Facing Our Risk (www.facingourrisk.com) and Bright Pink (www.brightpink.org) which some people have found useful.  They provide opportunities to speak with other individuals from high-risk families.    Our contact number was provided. Ms. Dicocco's questions were answered to her satisfaction, and she knows she is welcome to call us at anytime with additional questions or concerns.   Roma Kayser, MS, [facility] Certified Genetic Counselor [email or web address]

## 2017-04-11 DIAGNOSIS — Z86010 Personal history of colonic polyps: Secondary | ICD-10-CM | POA: Insufficient documentation

## 2017-04-14 ENCOUNTER — Other Ambulatory Visit: Payer: Self-pay | Admitting: Gastroenterology

## 2017-04-20 ENCOUNTER — Other Ambulatory Visit: Payer: Self-pay | Admitting: Gastroenterology

## 2017-04-21 ENCOUNTER — Other Ambulatory Visit: Payer: Self-pay | Admitting: Gastroenterology

## 2017-04-21 DIAGNOSIS — K56699 Other intestinal obstruction unspecified as to partial versus complete obstruction: Secondary | ICD-10-CM

## 2017-05-05 ENCOUNTER — Ambulatory Visit
Admission: RE | Admit: 2017-05-05 | Discharge: 2017-05-05 | Disposition: A | Discharge status: Home / Self Care | Payer: Medicare Other | Source: Ambulatory Visit | Attending: Gastroenterology | Admitting: Gastroenterology

## 2017-05-05 DIAGNOSIS — K56699 Other intestinal obstruction unspecified as to partial versus complete obstruction: Secondary | ICD-10-CM

## 2017-06-06 ENCOUNTER — Encounter (INDEPENDENT_AMBULATORY_CARE_PROVIDER_SITE_OTHER): Payer: Self-pay | Admitting: Orthopaedic Surgery

## 2017-06-06 ENCOUNTER — Ambulatory Visit (INDEPENDENT_AMBULATORY_CARE_PROVIDER_SITE_OTHER): Payer: Medicare Other | Admitting: Orthopaedic Surgery

## 2017-06-06 ENCOUNTER — Ambulatory Visit (INDEPENDENT_AMBULATORY_CARE_PROVIDER_SITE_OTHER): Payer: Medicare Other

## 2017-06-06 DIAGNOSIS — M25511 Pain in right shoulder: Secondary | ICD-10-CM | POA: Diagnosis not present

## 2017-06-06 MED ORDER — LIDOCAINE HCL 1 % IJ SOLN
3.0000 mL | INTRAMUSCULAR | Status: AC | PRN
  Administered 2017-06-06: 3 mL

## 2017-06-06 MED ORDER — METHYLPREDNISOLONE ACETATE 40 MG/ML IJ SUSP
40.0000 mg | INTRAMUSCULAR | Status: AC | PRN
  Administered 2017-06-06: 40 mg via INTRA_ARTICULAR

## 2017-06-06 NOTE — Progress Notes (Addendum)
Office Visit Note   Patient: Vanessa Day           Date of Birth: [birth year]           MRN: [id number] Visit Date: 06/06/2017              Requested by: Vanessa Neer, MD [address] [place], [place] [address] PCP: Vanessa Neer, MD   Assessment & Plan: Visit Diagnoses:  1. Acute pain of right shoulder     Plan: She will perform exercises with Thera-Band and other exercises that are shown on shoulder handout. Continue her Celebrex.  She will follow-up with [place] in 2 weeks to check her progress lack of. Follow-Up Instructions: Return in about 2 weeks (around 06/20/2017).   Orders:  Orders Placed This Encounter  Procedures  . XR Shoulder Right   No orders of the defined types were placed in this encounter.     Procedures: Large Joint Inj: R subacromial bursa on 06/06/2017 9:32 AM Indications: pain Details: 22 G 1.5 in needle, superior approach  Arthrogram: No  Medications: 3 mL lidocaine 1 %; 40 mg methylPREDNISolone acetate 40 MG/ML Outcome: tolerated well, no immediate complications Procedure, treatment alternatives, risks and benefits explained, specific risks discussed. Consent was given by the patient. Immediately prior to procedure a time out was called to verify the correct patient, procedure, equipment, support staff and site/side marked as required. Patient was prepped and draped in the usual sterile fashion.       Clinical Data: No additional findings.   Subjective: Chief Complaint  Patient presents with  . Right Shoulder - Pain    HPI Vanessa Day is a 77 year old female well-known to [facility] service comes in today with new complaint of right shoulder pain she states this is been going on for "quite a while".  She is had no particular injury.  She denies any radicular symptoms down either arm.  She is gone up Celebrex 200 mg twice daily due to the pain.  She has had neck pain in the past and had prior injections does not feel  like this is the issue.  She is taking tramadol at night she is also using a TENS unit. Review of Systems No recent fevers chills shortness of breath chest pain  Objective: Vital Signs: There were no vitals taken for this visit.  Physical Exam  Constitutional: She is oriented to person, place, and time. She appears well-developed and well-nourished. No distress.  Pulmonary/Chest: Effort normal.  Neurological: She is alert and oriented to person, place, and time.  Skin: She is not diaphoretic.  Psychiatric: She has a normal mood and affect.    Ortho Exam She lacks the last 10 degrees of forward flexion of the right shoulder.  Left shoulder 180 degrees.  Positive impingement on the left negative on the right.  5 out of 5 strength with external and internal rotation against resistance.  Slight weakness with liftoff test on the right negative liftoff test on the left. Specialty Comments:  No specialty comments available.  Imaging: Xr Shoulder Right  Result Date: 06/06/2017 Right shoulder 3 views: Shoulders well located.  No acute findings.  Glenohumeral joint is well-maintained.  Some: Space well maintained.    PMFS History: Patient Active Problem List   Diagnosis Date Noted  . Genetic testing 02/22/2017  . History of breast cancer 02/13/2017  . Family history of breast cancer   . Acute pain of right knee 07/18/2016  .  Trochanteric bursitis, right hip 07/18/2016  . Trochanteric bursitis, left hip 05/04/2016  . 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 Anxiety   . Arthritis    back  . Cancer ([facility]) 2000   rt breast cancer  . Family history of breast cancer   . GERD (gastroesophageal reflux disease)   . H/O hiatal hernia   . Hyperlipidemia   . Hypertension   . IBS (irritable bowel syndrome)   . Neck pain   . PONV (postoperative nausea and vomiting)   . Pre-diabetes     Family History  Problem Relation Age  of Onset  . Breast cancer Mother        dx over 49  . Prostate cancer Father        dx late 5s-early 70s  . COPD Father   . Cerebral palsy Brother   . Lung cancer Maternal Uncle        smoker  . Stroke Maternal Grandfather   . Cancer Cousin        NOS  . Breast cancer Daughter 66  . Anesthesia problems Neg Hx   . Hypotension Neg Hx   . Malignant hyperthermia Neg Hx   . Pseudochol deficiency Neg Hx     Past Surgical History:  Procedure Laterality Date  . ABDOMINAL HYSTERECTOMY    . APPENDECTOMY    . BACK SURGERY    . BREAST SURGERY    . CARPOMETACARPEL SUSPENSION PLASTY Left 08/25/2015   Procedure: SUSPENSIONPLASTY LEFT THUMB TRAPEZIUM EXCISION ABDUCTOR POLLICIS LONGUS TRANSFER;  Surgeon: Daryll Brod, MD;  Location: [facility];  Service: Orthopedics;  Laterality: Left;  . CHOLECYSTECTOMY    . EYE SURGERY     cataract right and left eye  . GAS INSERTION  06/23/2011   Procedure: INSERTION OF GAS;  Surgeon: Hayden Pedro, MD;  Location: [facility];  Service: Ophthalmology;  Laterality: Left;  SF6  . MASTECTOMY     right breast  . SCLERAL BUCKLE  06/23/2011   Procedure: SCLERAL BUCKLE;  Surgeon: Hayden Pedro, MD;  Location: [facility];  Service: Ophthalmology;  Laterality: Left;  . TONSILLECTOMY     Social History   Occupational History  . Not on file  Tobacco Use  . Smoking status: Former Smoker    Packs/day: 1.00    Years: 30.00    Pack years: 30.00    Types: Cigarettes    Last attempt to quit: 06/22/2002    Years since quitting: 14.9  . Smokeless tobacco: Never Used  Substance and Sexual Activity  . Alcohol use: Yes    Comment: rarely  . Drug use: No  . Sexual activity: Not Currently

## 2017-06-21 ENCOUNTER — Encounter (INDEPENDENT_AMBULATORY_CARE_PROVIDER_SITE_OTHER): Payer: Self-pay | Admitting: Physician Assistant

## 2017-06-21 ENCOUNTER — Ambulatory Visit (INDEPENDENT_AMBULATORY_CARE_PROVIDER_SITE_OTHER): Payer: Medicare Other | Admitting: Physician Assistant

## 2017-06-21 DIAGNOSIS — M7541 Impingement syndrome of right shoulder: Secondary | ICD-10-CM

## 2017-06-21 MED ORDER — METHYLPREDNISOLONE 4 MG PO TABS: ORAL_TABLET | ORAL | 0 refills | Status: DC

## 2017-06-21 NOTE — Progress Notes (Signed)
Office Visit Note   Patient: Vanessa Day           Date of Birth: [birth year]           MRN: [id number] Visit Date: 06/21/2017              Requested by: [name], MD [address] [place], [place] [address] PCP: [name], MD   Assessment & Plan: Visit Diagnoses:  1. Impingement syndrome of right shoulder     Plan: We will send her to formal physical therapy for her right shoulder impingement.  Also did place her on a prednisone Dosepak.  Follow-up in 1 month check her progress lack of.  Follow-Up Instructions: Return in about 1 month (around 07/19/2017).   Orders:  No orders of the defined types were placed in this encounter.  Meds ordered this encounter  Medications  . methylPREDNISolone (MEDROL) 4 MG tablet    Sig: Take as directed    Dispense:  21 tablet    Refill:  0      Procedures: No procedures performed   Clinical Data: No additional findings.   Subjective: Chief Complaint  Patient presents with  . Right Shoulder - Follow-up    Had injection on 06/06/2017 in right shoulder.    HPI Mrs. Lynde returns 2-week status post right shoulder subacromial injection.  She states shoulder is better but still hurts.  She is asking if she can have another injection of prednisone Dosepak.  She denies any radicular symptoms down the arm. Review of Systems Denies fevers chills shortness of breath chest pain nausea or vomiting.  Objective: Vital Signs: There were no vitals taken for this visit.  Physical Exam General well-developed well-nourished female no acute distress. Ortho Exam Bilateral shoulders she has 5 out of 5 strength with external and internal rotation against resistance.  Negative empty can test bilaterally.  Negative liftoff test bilaterally.  Positive impingement on the right negative on the left. Specialty Comments:  No specialty comments available.  Imaging: No results found.   PMFS History: Patient Active  Problem List   Diagnosis Date Noted  . Genetic testing 02/22/2017  . History of breast cancer 02/13/2017  . Family history of breast cancer   . Acute pain of right knee 07/18/2016  . Trochanteric bursitis, right hip 07/18/2016  . Trochanteric bursitis, left hip 05/04/2016  . 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 Anxiety   . Arthritis    back  . Cancer ([facility]) 2000   rt breast cancer  . Family history of breast cancer   . GERD (gastroesophageal reflux disease)   . H/O hiatal hernia   . Hyperlipidemia   . Hypertension   . IBS (irritable bowel syndrome)   . Neck pain   . PONV (postoperative nausea and vomiting)   . Pre-diabetes     Family History  Problem Relation Age of Onset  . Breast cancer Mother        dx over 64  . Prostate cancer Father        dx late 62s-early 70s  . COPD Father   . Cerebral palsy Brother   . Lung cancer Maternal Uncle        smoker  . Stroke Maternal Grandfather   . Cancer Cousin        NOS  . Breast cancer Daughter 55  . Anesthesia problems Neg Hx   .  Hypotension Neg Hx   . Malignant hyperthermia Neg Hx   . Pseudochol deficiency Neg Hx     Past Surgical History:  Procedure Laterality Date  . ABDOMINAL HYSTERECTOMY    . APPENDECTOMY    . BACK SURGERY    . BREAST SURGERY    . CARPOMETACARPEL SUSPENSION PLASTY Left 08/25/2015   Procedure: SUSPENSIONPLASTY LEFT THUMB TRAPEZIUM EXCISION ABDUCTOR POLLICIS LONGUS TRANSFER;  Surgeon: Daryll Brod, MD;  Location: [facility];  Service: Orthopedics;  Laterality: Left;  . CHOLECYSTECTOMY    . EYE SURGERY     cataract right and left eye  . GAS INSERTION  06/23/2011   Procedure: INSERTION OF GAS;  Surgeon: Hayden Pedro, MD;  Location: [facility];  Service: Ophthalmology;  Laterality: Left;  SF6  . MASTECTOMY     right breast  . SCLERAL BUCKLE  06/23/2011   Procedure: SCLERAL BUCKLE;  Surgeon: Hayden Pedro, MD;  Location:  [facility];  Service: Ophthalmology;  Laterality: Left;  . TONSILLECTOMY     Social History   Occupational History  . Not on file  Tobacco Use  . Smoking status: Former Smoker    Packs/day: 1.00    Years: 30.00    Pack years: 30.00    Types: Cigarettes    Last attempt to quit: 06/22/2002    Years since quitting: 15.0  . Smokeless tobacco: Never Used  Substance and Sexual Activity  . Alcohol use: Yes    Comment: rarely  . Drug use: No  . Sexual activity: Not Currently

## 2017-06-26 ENCOUNTER — Telehealth (INDEPENDENT_AMBULATORY_CARE_PROVIDER_SITE_OTHER): Payer: Self-pay | Admitting: Orthopaedic Surgery

## 2017-06-26 NOTE — Telephone Encounter (Signed)
Patient has some questions in regards to scheduling her PT. If you could give her a call back at [phone]

## 2017-06-27 NOTE — Telephone Encounter (Signed)
Patient wondering about Rx for PT, I told her the paper we gave her, was the Rx. She verbalizes understanding

## 2017-11-15 ENCOUNTER — Other Ambulatory Visit: Payer: Self-pay | Admitting: Family Medicine

## 2017-11-15 DIAGNOSIS — R2231 Localized swelling, mass and lump, right upper limb: Secondary | ICD-10-CM

## 2017-11-23 ENCOUNTER — Ambulatory Visit
Admission: RE | Admit: 2017-11-23 | Discharge: 2017-11-23 | Disposition: A | Discharge status: Home / Self Care | Payer: Medicare Other | Source: Ambulatory Visit | Attending: Family Medicine | Admitting: Family Medicine

## 2017-11-23 DIAGNOSIS — R2231 Localized swelling, mass and lump, right upper limb: Secondary | ICD-10-CM

## 2018-01-24 DIAGNOSIS — C3491 Malignant neoplasm of unspecified part of right bronchus or lung: Secondary | ICD-10-CM

## 2018-01-24 HISTORY — DX: Malignant neoplasm of unspecified part of right bronchus or lung: C34.91

## 2018-02-15 ENCOUNTER — Other Ambulatory Visit: Payer: Self-pay | Admitting: Family Medicine

## 2018-02-15 ENCOUNTER — Ambulatory Visit
Admission: RE | Admit: 2018-02-15 | Discharge: 2018-02-15 | Disposition: A | Discharge status: Home / Self Care | Payer: Medicare Other | Source: Ambulatory Visit | Attending: Family Medicine | Admitting: Family Medicine

## 2018-02-15 DIAGNOSIS — Z1231 Encounter for screening mammogram for malignant neoplasm of breast: Secondary | ICD-10-CM

## 2018-04-06 ENCOUNTER — Other Ambulatory Visit: Payer: Self-pay | Admitting: Family Medicine

## 2018-04-06 DIAGNOSIS — M8580 Other specified disorders of bone density and structure, unspecified site: Secondary | ICD-10-CM

## 2018-04-06 DIAGNOSIS — R911 Solitary pulmonary nodule: Secondary | ICD-10-CM

## 2018-04-11 ENCOUNTER — Ambulatory Visit
Admission: RE | Admit: 2018-04-11 | Discharge: 2018-04-11 | Disposition: A | Discharge status: Home / Self Care | Payer: Medicare Other | Source: Ambulatory Visit | Attending: Family Medicine | Admitting: Family Medicine

## 2018-04-11 DIAGNOSIS — R911 Solitary pulmonary nodule: Secondary | ICD-10-CM

## 2018-04-18 ENCOUNTER — Telehealth: Payer: Self-pay | Admitting: Internal Medicine

## 2018-04-18 ENCOUNTER — Encounter: Payer: Self-pay | Admitting: *Deleted

## 2018-04-18 NOTE — Progress Notes (Signed)
Oncology Nurse Navigator Documentation  Oncology Nurse Navigator Flowsheets 04/18/2018  Navigator Location [facility]  Referral date to RadOnc/MedOnc 04/17/2018  Navigator Encounter Type Other/I received referral on Vanessa Day.  I updated Dr. Julien Nordmann on referral.  To help expedite care I did reach out to referring physician to order PET scan yesterday.  I did not hear from that physician so I updated our scheduling team to call patient and schedule her to be seen 04/30/2018.   Treatment Phase Abnormal Scans  Barriers/Navigation Needs Coordination of Care  Interventions Coordination of Care  Coordination of Care Other  Acuity Level 2  Time Spent with Patient 30

## 2018-04-18 NOTE — Telephone Encounter (Signed)
Received a new patient referral from Dr. [name]. A new patient appt has been scheduled for Ms. Gilvin to see Dr. Julien Nordmann on 4/6 at 2:15pm with labs at 1:45pm. Ms. Mittal has been made aware of the appt date and time.

## 2018-04-27 ENCOUNTER — Other Ambulatory Visit: Payer: Self-pay | Admitting: Physician Assistant

## 2018-04-27 DIAGNOSIS — R911 Solitary pulmonary nodule: Secondary | ICD-10-CM

## 2018-04-30 ENCOUNTER — Inpatient Hospital Stay: Payer: Medicare Other

## 2018-04-30 ENCOUNTER — Other Ambulatory Visit: Payer: Self-pay

## 2018-04-30 ENCOUNTER — Encounter: Payer: Self-pay | Admitting: Internal Medicine

## 2018-04-30 ENCOUNTER — Inpatient Hospital Stay: Payer: Medicare Other | Attending: Internal Medicine | Admitting: Internal Medicine

## 2018-04-30 DIAGNOSIS — F419 Anxiety disorder, unspecified: Secondary | ICD-10-CM

## 2018-04-30 DIAGNOSIS — Z8042 Family history of malignant neoplasm of prostate: Secondary | ICD-10-CM

## 2018-04-30 DIAGNOSIS — M1990 Unspecified osteoarthritis, unspecified site: Secondary | ICD-10-CM | POA: Diagnosis not present

## 2018-04-30 DIAGNOSIS — I10 Essential (primary) hypertension: Secondary | ICD-10-CM | POA: Diagnosis not present

## 2018-04-30 DIAGNOSIS — R911 Solitary pulmonary nodule: Secondary | ICD-10-CM

## 2018-04-30 DIAGNOSIS — K219 Gastro-esophageal reflux disease without esophagitis: Secondary | ICD-10-CM | POA: Diagnosis not present

## 2018-04-30 DIAGNOSIS — Z801 Family history of malignant neoplasm of trachea, bronchus and lung: Secondary | ICD-10-CM

## 2018-04-30 DIAGNOSIS — Z87891 Personal history of nicotine dependence: Secondary | ICD-10-CM

## 2018-04-30 DIAGNOSIS — Z803 Family history of malignant neoplasm of breast: Secondary | ICD-10-CM

## 2018-04-30 LAB — CMP (CANCER CENTER ONLY)
ALT: 20 U/L (ref 0–44)
AST: 15 U/L (ref 15–41)
Albumin: 4.2 g/dL (ref 3.5–5.0)
Alkaline Phosphatase: 69 U/L (ref 38–126)
Anion gap: 11 (ref 5–15)
BUN: 21 mg/dL (ref 8–23)
CO2: 28 mmol/L (ref 22–32)
Calcium: 10.5 mg/dL — ABNORMAL HIGH (ref 8.9–10.3)
Chloride: 104 mmol/L (ref 98–111)
Creatinine: 1.26 mg/dL — ABNORMAL HIGH (ref 0.44–1.00)
GFR, Est AFR Am: 47 mL/min — ABNORMAL LOW (ref >60)
GFR, Estimated: 41 mL/min — ABNORMAL LOW (ref >60)
Glucose, Bld: 123 mg/dL — ABNORMAL HIGH (ref 70–99)
Potassium: 4 mmol/L (ref 3.5–5.1)
Sodium: 143 mmol/L (ref 135–145)
Total Bilirubin: 1.2 mg/dL (ref 0.3–1.2)
Total Protein: 7.8 g/dL (ref 6.5–8.1)

## 2018-04-30 LAB — CBC WITH DIFFERENTIAL (CANCER CENTER ONLY)
Abs Immature Granulocytes: 0.02 10*3/uL (ref 0.00–0.07)
Basophils Absolute: 0 10*3/uL (ref 0.0–0.1)
Basophils Relative: 0 %
Eosinophils Absolute: 0.1 10*3/uL (ref 0.0–0.5)
Eosinophils Relative: 1 %
HCT: 44.9 % (ref 36.0–46.0)
Hemoglobin: 14.5 g/dL (ref 12.0–15.0)
Immature Granulocytes: 0 %
Lymphocytes Relative: 23 %
Lymphs Abs: 1.9 10*3/uL (ref 0.7–4.0)
MCH: 30.8 pg (ref 26.0–34.0)
MCHC: 32.3 g/dL (ref 30.0–36.0)
MCV: 95.3 fL (ref 80.0–100.0)
Monocytes Absolute: 0.7 10*3/uL (ref 0.1–1.0)
Monocytes Relative: 8 %
Neutro Abs: 5.7 10*3/uL (ref 1.7–7.7)
Neutrophils Relative %: 68 %
Platelet Count: 337 10*3/uL (ref 150–400)
RBC: 4.71 MIL/uL (ref 3.87–5.11)
RDW: 12.9 % (ref 11.5–15.5)
WBC Count: 8.5 10*3/uL (ref 4.0–10.5)
nRBC: 0 % (ref 0.0–0.2)

## 2018-04-30 NOTE — Progress Notes (Signed)
[facility] [phone]   [phone]  CONSULT NOTE  REFERRING PHYSICIAN:  REASON FOR CONSULTATION:  78 years old white female with suspicious lung nodule.  HPI Vanessa Day is a 78 y.o. female with past medical history significant for osteoarthritis, anxiety, hypertension, GERD, dyslipidemia, irritable bowel syndrome as well as history of breast cancer 20 years ago.  The patient mentions that she had visual colonoscopy performed on May 05, 2017.  Incidentally it showed patchy airspace opacity anterior and of the right lower lobe questionable for scarring or area of infiltrate.  There was also a 3 mm left lower lobe pulmonary nodule.  The patient was followed by observation and she recently saw her primary care physician Dr. Serita Grammes for annual physical exam and evaluation.  She ordered CT scan of the chest without contrast for further evaluation of this pulmonary opacity.  The scan was done on April 11, 2018 and it showed focal opacity tracks along the segmental bronchus in the anterior right lower lobe which has increased when compared to the prior scan of the abdomen and pelvis.  It measured 1.3 cm from superior to inferior by 1.2 cm from anterior to posterior increased from 1.0 x 0.7 cm on the previous study.  The left lower lobe 3 mm nodule was unchanged. Dr. Brigitte Pulse kindly referred the patient to me today for evaluation and recommendation regarding this abnormality. When seen today the patient is feeling fine with no concerning complaints except for the osteoarthritis.  She denied having any chest pain, shortness of breath but has occasional cough secondary to postnasal drainage with no hemoptysis.  She denied having any fever or chills.  She has no nausea, vomiting, diarrhea or constipation.  She denied having any headache or visual changes. Family history significant for mother with breast cancer, father with prostate cancer, maternal uncle with  lung cancer and daughter with breast cancer. The patient is married and has 1 son.  Her daughter deceased.  She is to work at [employer].  She has a history for smoking for around 25 years and she quit 25 years ago.  She drinks a glass of wine on a daily basis and no history of drug abuse. HPI  Past Medical History:  Diagnosis Date  . Anxiety   . Arthritis    back  . Cancer ([facility]) 2000   rt breast cancer  . Family history of breast cancer   . GERD (gastroesophageal reflux disease)   . H/O hiatal hernia   . Hyperlipidemia   . Hypertension   . IBS (irritable bowel syndrome)   . Neck pain   . PONV (postoperative nausea and vomiting)   . Pre-diabetes     Past Surgical History:  Procedure Laterality Date  . ABDOMINAL HYSTERECTOMY    . APPENDECTOMY    . BACK SURGERY    . BREAST BIOPSY    . BREAST SURGERY    . CARPOMETACARPEL SUSPENSION PLASTY Left 08/25/2015   Procedure: SUSPENSIONPLASTY LEFT THUMB TRAPEZIUM EXCISION ABDUCTOR POLLICIS LONGUS TRANSFER;  Surgeon: Daryll Brod, MD;  Location: [facility];  Service: Orthopedics;  Laterality: Left;  . CHOLECYSTECTOMY    . EYE SURGERY     cataract right and left eye  . GAS INSERTION  06/23/2011   Procedure: INSERTION OF GAS;  Surgeon: Hayden Pedro, MD;  Location: [facility];  Service: Ophthalmology;  Laterality: Left;  SF6  . MASTECTOMY     right breast  .  SCLERAL BUCKLE  06/23/2011   Procedure: SCLERAL BUCKLE;  Surgeon: Hayden Pedro, MD;  Location: [facility];  Service: Ophthalmology;  Laterality: Left;  . TONSILLECTOMY      Family History  Problem Relation Age of Onset  . Breast cancer Mother        dx over 29  . Prostate cancer Father        dx late 94s-early 70s  . COPD Father   . Cerebral palsy Brother   . Lung cancer Maternal Uncle        smoker  . Stroke Maternal Grandfather   . Cancer Cousin        NOS  . Breast cancer Daughter 21  . Anesthesia problems Neg Hx   . Hypotension Neg Hx   . Malignant  hyperthermia Neg Hx   . Pseudochol deficiency Neg Hx     Social History Social History   Tobacco Use  . Smoking status: Former Smoker    Packs/day: 1.00    Years: 30.00    Pack years: 30.00    Types: Cigarettes    Last attempt to quit: 06/22/2002    Years since quitting: 15.8  . Smokeless tobacco: Never Used  Substance Use Topics  . Alcohol use: Yes    Comment: rarely  . Drug use: No    Allergies  Allergen Reactions  . Codeine Itching  . Statins Other (See Comments)    Muscle soreness  . Sulfa Antibiotics     Breakout around face    Current Outpatient Medications  Medication Sig Dispense Refill  . aspirin 81 MG tablet Take 81 mg by mouth daily.    . cetirizine (ZYRTEC) 10 MG tablet Take 10 mg by mouth daily as needed. For seasonal allergies    . DULoxetine (CYMBALTA) 20 MG capsule Take 20 mg by mouth 2 (two) times daily.  5  . ezetimibe (ZETIA) 10 MG tablet Take 10 mg by mouth daily.    . hydrochlorothiazide (HYDRODIURIL) 25 MG tablet Take 25 mg by mouth daily.    Marland Kitchen HYDROcodone-acetaminophen (NORCO) 5-325 MG tablet Take 1 tablet by mouth every 6 (six) hours as needed for moderate pain. 30 tablet 0  . LORazepam (ATIVAN) 1 MG tablet Take 1 mg by mouth every 8 (eight) hours.    Marland Kitchen losartan (COZAAR) 50 MG tablet Take 50 mg by mouth daily.    . methylPREDNISolone (MEDROL) 4 MG tablet Take as directed 21 tablet 0  . Misc Natural Products (TART CHERRY ADVANCED PO) Take by mouth.    . Omega-3 Fatty Acids (FISH OIL) 1000 MG CAPS Take by mouth.     No current facility-administered medications for this visit.     Review of Systems  Constitutional: negative Eyes: negative Ears, nose, mouth, throat, and face: negative Respiratory: positive for cough Cardiovascular: negative Gastrointestinal: negative Genitourinary:negative Integument/breast: negative Hematologic/lymphatic: negative Musculoskeletal:positive for arthralgias Neurological: negative Behavioral/Psych: negative  Endocrine: negative Allergic/Immunologic: negative  Physical Exam  PYK:DXIPJ, healthy, no distress, well nourished and well developed SKIN: skin color, texture, turgor are normal, no rashes or significant lesions HEAD: Normocephalic, No masses, lesions, tenderness or abnormalities EYES: normal, PERRLA, Conjunctiva are pink and non-injected EARS: External ears normal, Canals clear OROPHARYNX:no exudate, no erythema and lips, buccal mucosa, and tongue normal  NECK: supple, no adenopathy, no JVD LYMPH:  no palpable lymphadenopathy, no hepatosplenomegaly BREAST:not examined LUNGS: clear to auscultation , and palpation HEART: regular rate & rhythm, no murmurs and no gallops ABDOMEN:abdomen soft, non-tender and  normal bowel sounds BACK: Back symmetric, no curvature., No CVA tenderness EXTREMITIES:no joint deformities, effusion, or inflammation, no edema  NEURO: alert & oriented x 3 with fluent speech, no focal motor/sensory deficits  PERFORMANCE STATUS: ECOG 1  LABORATORY DATA: Lab Results  Component Value Date   WBC 8.5 04/30/2018   HGB 14.5 04/30/2018   HCT 44.9 04/30/2018   MCV 95.3 04/30/2018   PLT 337 04/30/2018      Chemistry      Component Value Date/Time   NA 143 04/30/2018 1401   K 4.0 04/30/2018 1401   CL 104 04/30/2018 1401   CO2 28 04/30/2018 1401   BUN 21 04/30/2018 1401   CREATININE 1.26 (H) 04/30/2018 1401      Component Value Date/Time   CALCIUM 10.5 (H) 04/30/2018 1401   ALKPHOS 69 04/30/2018 1401   AST 15 04/30/2018 1401   ALT 20 04/30/2018 1401   BILITOT 1.2 04/30/2018 1401       RADIOGRAPHIC STUDIES: Ct Chest Wo Contrast  Result Date: 04/11/2018 CLINICAL DATA:  Pulmonary nodule seen on abd ctDenies symptomsHx breast ca - rt mastectomy Hx gb, appy, tonsils EXAM: CT CHEST WITHOUT CONTRAST TECHNIQUE: Multidetector CT imaging of the chest was performed following the standard protocol without IV contrast. COMPARISON:  Abdomen and pelvis CT, 05/05/2017  FINDINGS: Cardiovascular: Heart is normal in size and configuration. Three-vessel coronary artery calcifications. No pericardial effusion. Great vessels are normal in caliber. There are aortic atherosclerotic calcifications. Mediastinum/Nodes: No neck base or axillary masses or enlarged lymph nodes. Right breast and axillary vascular clips from treatment of previous breast carcinoma. No mediastinal or hilar masses no adenopathy. Trachea and esophagus are unremarkable. Lungs/Pleura: Focal opacity tracks along a segmental bronchus in the anterior right lower lobe, which has increased when compared to the prior abdomen and pelvis CT. It measures 13 mm from superior to inferior by 12 mm from anterior to posterior increased from approximately 10 mm x 7 mm on the prior study respectively. Small dense nodule, average Hounsfield units of over 70, peripheral left lower lobe, 3 mm, unchanged. Small calcified granuloma, inferior right upper lobe, image 76, above the field of view from the prior abdomen and pelvis CT. No other lung nodules. No other left areas of lung opacity. No evidence of pneumonia or pulmonary edema. No pleural effusion or pneumothorax. Upper Abdomen: 2.7 cm low-density mass, upper pole the right kidney, consistent with a cyst, stable. No acute findings in the upper abdomen. Musculoskeletal: No fracture or acute finding. No osteoblastic or osteolytic lesions. IMPRESSION: 1. Focal opacity along the anterior segmental bronchus in the right lower lobe has increased in size from the prior CT. This has a nodular configuration peripherally raising suspicion for neoplastic disease. Recommend consultation with pulmonary/thoracic medicine for consideration of tissue sampling. 2. Small dense nodule, consistent with a granuloma, left lower lobe, 3 mm, stable. Small calcified granuloma in the inferior right lower lobe. No other nodules. 3. No acute findings. Aortic Atherosclerosis (ICD10-I70.0). Electronically Signed    By: Lajean Manes M.D.   On: 04/11/2018 20:41    ASSESSMENT: This is a very pleasant 78 years old white female with suspicious right lower lobe focal opacity concerning for neoplasm.   PLAN: I had a lengthy discussion with the patient today about her current condition and further recommendation regarding the nodule. I personally and independently reviewed the scan images and discussed the results with the patient and showed her the images today. I recommended for the patient to  have a PET scan performed 2 months after improvement of the COVID19 pandemic. Based on the PET scan results, I will decide whether to consider the patient for biopsy or referral to cardiothoracic surgery for resection. The patient agreed to the current plan. She was advised to call immediately if she has any concerning symptoms in the interval. The patient voices understanding of current disease status and treatment options and is in agreement with the current care plan.  All questions were answered. The patient knows to call the clinic with any problems, questions or concerns. We can certainly see the patient much sooner if necessary.  Thank you so much for allowing me to participate in the care of Vanessa Day. I will continue to follow up the patient with you and assist in her care.  I spent 40 minutes counseling the patient face to face. The total time spent in the appointment was 60 minutes.  Disclaimer: This note was dictated with voice recognition software. Similar sounding words can inadvertently be transcribed and may not be corrected upon review.   Eilleen Kempf April 30, 2018, 2:41 PM

## 2018-05-01 ENCOUNTER — Telehealth: Payer: Self-pay | Admitting: *Deleted

## 2018-05-01 ENCOUNTER — Telehealth: Payer: Self-pay | Admitting: Internal Medicine

## 2018-05-01 NOTE — Telephone Encounter (Signed)
Called regarding 6/9

## 2018-05-01 NOTE — Telephone Encounter (Signed)
Oncology Nurse Navigator Documentation  Oncology Nurse Navigator Flowsheets 05/01/2018  Navigator Location [facility]  Referral date to RadOnc/MedOnc -  Navigator Encounter Type Telephone/Ms. Austria recently saw Dr. Julien Nordmann as a new patient.  Her treatment plan is a PET scan and follow up in 2 months due to pandemic.  I called to check on her to see if she had any questions about plan of care.  We spoke and I listened as she explained the plan and how she was feeling about the news.  I asked that she call if she needed to be seen sooner than 2 months.    Telephone Outgoing Call  Abnormal Finding Date 04/11/2018  Treatment Phase Abnormal Scans  Barriers/Navigation Needs Education  Education Other  Interventions Education  Coordination of Care -  Education Method Verbal  Acuity Level 1  Time Spent with Patient 30

## 2018-06-08 ENCOUNTER — Other Ambulatory Visit: Payer: Medicare Other

## 2018-06-28 ENCOUNTER — Other Ambulatory Visit: Payer: Self-pay

## 2018-06-28 ENCOUNTER — Ambulatory Visit (HOSPITAL_COMMUNITY)
Admission: RE | Admit: 2018-06-28 | Discharge: 2018-06-28 | Disposition: A | Discharge status: Home / Self Care | Payer: Medicare Other | Source: Ambulatory Visit | Attending: Internal Medicine | Admitting: Internal Medicine

## 2018-06-28 DIAGNOSIS — Z79899 Other long term (current) drug therapy: Secondary | ICD-10-CM | POA: Insufficient documentation

## 2018-06-28 DIAGNOSIS — R911 Solitary pulmonary nodule: Secondary | ICD-10-CM

## 2018-06-28 LAB — GLUCOSE, CAPILLARY: Glucose-Capillary: 131 mg/dL — ABNORMAL HIGH (ref 70–99)

## 2018-06-28 MED ORDER — FLUDEOXYGLUCOSE F - 18 (FDG) INJECTION
6.7000 | Freq: Once | INTRAVENOUS | Status: AC | PRN
  Administered 2018-06-28: 6.7 via INTRAVENOUS

## 2018-07-03 ENCOUNTER — Inpatient Hospital Stay: Payer: Medicare Other | Attending: Internal Medicine | Admitting: Internal Medicine

## 2018-07-03 ENCOUNTER — Other Ambulatory Visit: Payer: Self-pay

## 2018-07-03 ENCOUNTER — Encounter: Payer: Self-pay | Admitting: Internal Medicine

## 2018-07-03 VITALS — BP 136/74 | HR 77 | Temp 98.6°F | Resp 18 | Ht 63.0 in | Wt 142.2 lb

## 2018-07-03 DIAGNOSIS — R911 Solitary pulmonary nodule: Secondary | ICD-10-CM | POA: Diagnosis present

## 2018-07-03 DIAGNOSIS — R110 Nausea: Secondary | ICD-10-CM | POA: Diagnosis not present

## 2018-07-03 DIAGNOSIS — R197 Diarrhea, unspecified: Secondary | ICD-10-CM

## 2018-07-03 DIAGNOSIS — R918 Other nonspecific abnormal finding of lung field: Secondary | ICD-10-CM

## 2018-07-03 DIAGNOSIS — K5900 Constipation, unspecified: Secondary | ICD-10-CM

## 2018-07-03 NOTE — Progress Notes (Signed)
[place] [phone]   Fax:([phone]  OFFICE PROGRESS NOTE  [name], MD [address] [place] [address]  DIAGNOSIS: Suspicious right lower lobe focal opacity concerning for neoplasm.  PRIOR THERAPY: None  CURRENT THERAPY: None.  INTERVAL HISTORY: Vanessa Day 78 y.o. female returns to the clinic today for follow-up visit.  The patient is feeling fine today with no concerning complaints except for few episodes of nausea as well as alternating diarrhea and constipation.  She has these symptoms for several years.  She denied having any recent chest pain, shortness of breath, cough or hemoptysis.  She denied having any fever or chills.  She has no weight loss or night sweats.  The patient had a PET scan performed recently and she is here for evaluation and discussion of her scan results and recommendation regarding her condition.  MEDICAL HISTORY: Past Medical History:  Diagnosis Date  . Anxiety   . Arthritis    back  . Cancer ([place]) 2000   rt breast cancer  . Family history of breast cancer   . GERD (gastroesophageal reflux disease)   . H/O hiatal hernia   . Hyperlipidemia   . Hypertension   . IBS (irritable bowel syndrome)   . Neck pain   . PONV (postoperative nausea and vomiting)   . Pre-diabetes     ALLERGIES:  is allergic to hydrocodone; codeine; statins; and sulfa antibiotics.  MEDICATIONS:  Current Outpatient Medications  Medication Sig Dispense Refill  . aspirin 81 MG tablet Take 81 mg by mouth daily.    Marland Kitchen desipramine (NORPRAMIN) 25 MG tablet Take 25 mg by mouth daily.    Marland Kitchen ezetimibe (ZETIA) 10 MG tablet Take 10 mg by mouth daily.    . hydrochlorothiazide (HYDRODIURIL) 25 MG tablet Take 25 mg by mouth daily.    Marland Kitchen loratadine-pseudoephedrine (CLARITIN-D 24-HOUR) 10-240 MG 24 hr tablet Take 1 tablet by mouth daily.    Marland Kitchen LORazepam (ATIVAN) 1 MG tablet Take 1 mg by mouth every 8 (eight) hours.    Marland Kitchen  losartan (COZAAR) 100 MG tablet Take 100 mg by mouth daily.    . Misc Natural Products (TART CHERRY ADVANCED PO) Take by mouth.    . Multiple Vitamins-Minerals (VITAMIN D3 COMPLETE PO) Take 1 tablet by mouth daily. 1,000 mg    . Omega-3 Fatty Acids (FISH OIL) 1000 MG CAPS Take by mouth.    . pregabalin (LYRICA) 50 MG capsule Take 50 mg by mouth 3 (three) times daily.    . vitamin E 1000 UNIT capsule Take 1 tablet by mouth daily.     No current facility-administered medications for this visit.     SURGICAL HISTORY:  Past Surgical History:  Procedure Laterality Date  . ABDOMINAL HYSTERECTOMY    . APPENDECTOMY    . BACK SURGERY    . BREAST BIOPSY    . BREAST SURGERY    . CARPOMETACARPEL SUSPENSION PLASTY Left 08/25/2015   Procedure: SUSPENSIONPLASTY LEFT THUMB TRAPEZIUM EXCISION ABDUCTOR POLLICIS LONGUS TRANSFER;  Surgeon: Daryll Brod, MD;  Location: [facility];  Service: Orthopedics;  Laterality: Left;  . CHOLECYSTECTOMY    . EYE SURGERY     cataract right and left eye  . GAS INSERTION  06/23/2011   Procedure: INSERTION OF GAS;  Surgeon: Hayden Pedro, MD;  Location: [facility];  Service: Ophthalmology;  Laterality: Left;  SF6  . MASTECTOMY     right breast  .  SCLERAL BUCKLE  06/23/2011   Procedure: SCLERAL BUCKLE;  Surgeon: Hayden Pedro, MD;  Location: [facility];  Service: Ophthalmology;  Laterality: Left;  . TONSILLECTOMY      REVIEW OF SYSTEMS:  A comprehensive review of systems was negative except for: Gastrointestinal: positive for constipation, diarrhea and nausea   PHYSICAL EXAMINATION: General appearance: alert, cooperative, fatigued and no distress Head: Normocephalic, without obvious abnormality, atraumatic Neck: no adenopathy, no JVD, supple, symmetrical, trachea midline and thyroid not enlarged, symmetric, no tenderness/mass/nodules Lymph nodes: Cervical, supraclavicular, and axillary nodes normal. Resp: clear to auscultation bilaterally Back: symmetric, no  curvature. ROM normal. No CVA tenderness. Cardio: regular rate and rhythm, S1, S2 normal, no murmur, click, rub or gallop GI: soft, non-tender; bowel sounds normal; no masses,  no organomegaly Extremities: extremities normal, atraumatic, no cyanosis or edema  ECOG PERFORMANCE STATUS: 1 - Symptomatic but completely ambulatory  Blood pressure 136/74, pulse 77, temperature 98.6 F (37 C), temperature source Oral, resp. rate 18, height 5\' 3"  (1.6 m), weight 142 lb 3.2 oz (64.5 kg), SpO2 99 %.  LABORATORY DATA: Lab Results  Component Value Date   WBC 8.5 04/30/2018   HGB 14.5 04/30/2018   HCT 44.9 04/30/2018   MCV 95.3 04/30/2018   PLT 337 04/30/2018      Chemistry      Component Value Date/Time   NA 143 04/30/2018 1401   K 4.0 04/30/2018 1401   CL 104 04/30/2018 1401   CO2 28 04/30/2018 1401   BUN 21 04/30/2018 1401   CREATININE 1.26 (H) 04/30/2018 1401      Component Value Date/Time   CALCIUM 10.5 (H) 04/30/2018 1401   ALKPHOS 69 04/30/2018 1401   AST 15 04/30/2018 1401   ALT 20 04/30/2018 1401   BILITOT 1.2 04/30/2018 1401       RADIOGRAPHIC STUDIES: Nm Pet Image Initial (pi) Skull Base To Thigh  Result Date: 06/28/2018 CLINICAL DATA:  Initial treatment strategy for right lung nodule. EXAM: NUCLEAR MEDICINE PET SKULL BASE TO THIGH TECHNIQUE: 6.7 mCi F-18 FDG was injected intravenously. Full-ring PET imaging was performed from the skull base to thigh after the radiotracer. CT data was obtained and used for attenuation correction and anatomic localization. Fasting blood glucose: 131 mg/dl COMPARISON:  Chest CT on 04/11/2018 FINDINGS: Mediastinal blood-pool activity (background): SUV max = 3.0 Liver activity (reference): SUV max = N/A NECK:  No hypermetabolic lymph nodes or masses. Incidental CT findings:  None. CHEST: No hypermetabolic lymphadenopathy. An irregular nodule in the anterior right lower lobe measuring 2.5 cm shows mild hypermetabolic activity, with SUV max of 3.1. 3  mm left lower lobe pulmonary nodule shows no FDG uptake, and is stable compared to earlier CT of 05/05/2017. Incidental CT findings:  Aortic and coronary artery atherosclerosis. ABDOMEN/PELVIS: No abnormal hypermetabolic activity within the liver, pancreas, adrenal glands, or spleen. No hypermetabolic lymph nodes in the abdomen or pelvis. Incidental CT findings: Mild diverticulosis is seen involving the sigmoid colon, however there is no evidence of diverticulitis. Prior hysterectomy noted. SKELETON: No focal hypermetabolic bone lesions to suggest skeletal metastasis. Incidental CT findings:  None. IMPRESSION: 2.5 cm right lower lobe pulmonary nodule shows mild hypermetabolic activity. Differential diagnosis includes low-grade bronchogenic carcinoma versus inflammatory process. No evidence of local or distant metastatic disease. Electronically Signed   By: Earle Gell M.D.   On: 06/28/2018 14:22    ASSESSMENT AND PLAN: This is a very pleasant 78 years old white female with suspicious right lower lobe lung  mass with mild hypermetabolic activity on the recent PET scan that suspicious for low-grade bronchogenic carcinoma. I personally and independently reviewed the scan images and discussed the results with the patient today. I recommended for the patient to see cardiothoracic surgery for evaluation of this lesion and consideration of surgical resection since the patient has this pulmonary abnormality for several months with no improvement. She will be referred to Dr. Roxan Hockey for evaluation. I will see the patient after her surgical evaluation for further recommendations regarding her condition. She was advised to call immediately if she has any concerning symptoms in the interval. The patient voices understanding of current disease status and treatment options and is in agreement with the current care plan.  All questions were answered. The patient knows to call the clinic with any problems, questions  or concerns. We can certainly see the patient much sooner if necessary.  I spent 10 minutes counseling the patient face to face. The total time spent in the appointment was 15 minutes.  Disclaimer: This note was dictated with voice recognition software. Similar sounding words can inadvertently be transcribed and may not be corrected upon review.

## 2018-07-09 ENCOUNTER — Encounter: Payer: Self-pay | Admitting: *Deleted

## 2018-07-09 ENCOUNTER — Telehealth: Payer: Self-pay | Admitting: *Deleted

## 2018-07-09 NOTE — Telephone Encounter (Signed)
Oncology Nurse Navigator Documentation  Oncology Nurse Navigator Flowsheets 07/09/2018  Navigator Location [facility]  Referral Date to RadOnc/MedOnc -  Navigator Encounter Type Telephone/I received a message Ms. Gravlin called questioning appt with Dr. Roxan Hockey.  I contacted his office to check on referral and also called Ms. Hornberger to update her she will get a call from them with appt.  She was thankful for the call back.   Telephone Incoming Call;Outgoing Call  Abnormal Finding Date -  Treatment Phase Abnormal Scans  Barriers/Navigation Needs Coordination of Care;Education  Education Other  Interventions Coordination of Care;Education  Coordination of Care Other  Education Method Verbal  Acuity Level 2  Time Spent with Patient 30

## 2018-07-09 NOTE — Progress Notes (Signed)
Oncology Nurse Navigator Documentation  Oncology Nurse Navigator Flowsheets 07/09/2018  Navigator Location [facility]  Referral Date to RadOnc/MedOnc -  Navigator Encounter Type Treatment/I received a message from Dr. Leonarda Salon office to see if patient could be seen here at [facility].  I update ok and called patient with the appt time and place. Patient verbalized understanding of appt time and place.   Telephone Outgoing Call  Abnormal Finding Date -  Treatment Phase Abnormal Scans  Barriers/Navigation Needs Coordination of Care;Education  Education Other  Interventions Coordination of Care;Education  Coordination of Care Appts  Education Method Verbal  Acuity Level 2  Time Spent with Patient 30

## 2018-07-11 ENCOUNTER — Telehealth: Payer: Self-pay | Admitting: *Deleted

## 2018-07-11 ENCOUNTER — Encounter: Payer: Self-pay | Admitting: *Deleted

## 2018-07-11 NOTE — Telephone Encounter (Signed)
Oncology Nurse Navigator Documentation  Oncology Nurse Navigator Flowsheets 07/11/2018  Navigator Location [facility]  Referral Date to RadOnc/MedOnc -  Navigator Encounter Type Telephone/I received a message that patient was confused about appt place.  I called her to clarify.  She was thankful for the call.   Telephone Outgoing Call  Abnormal Finding Date -  Treatment Phase Abnormal Scans  Barriers/Navigation Needs Education  Education Other  Interventions Education  Coordination of Care -  Education Method Verbal  Acuity Level 1  Time Spent with Patient 15

## 2018-07-12 ENCOUNTER — Other Ambulatory Visit: Payer: Self-pay | Admitting: *Deleted

## 2018-07-12 ENCOUNTER — Institutional Professional Consult (permissible substitution) (INDEPENDENT_AMBULATORY_CARE_PROVIDER_SITE_OTHER): Payer: Medicare Other | Admitting: Thoracic Surgery (Cardiothoracic Vascular Surgery)

## 2018-07-12 ENCOUNTER — Other Ambulatory Visit: Payer: Self-pay

## 2018-07-12 ENCOUNTER — Encounter: Payer: Self-pay | Admitting: Thoracic Surgery (Cardiothoracic Vascular Surgery)

## 2018-07-12 VITALS — BP 135/61 | HR 64 | Temp 97.8°F | Resp 18 | Wt 144.0 lb

## 2018-07-12 DIAGNOSIS — R911 Solitary pulmonary nodule: Secondary | ICD-10-CM | POA: Diagnosis not present

## 2018-07-12 DIAGNOSIS — R918 Other nonspecific abnormal finding of lung field: Secondary | ICD-10-CM

## 2018-07-12 NOTE — H&P (View-Only) (Signed)
PCP is [name], MD Referring Provider is Curt Bears, MD  No chief complaint on file.   HPI: 78 yo woman sent for consultation re: right lower lobe mass  Vanessa Day is a 78 yo woman with a past history of breast cancer, right mastectomy, TRAM flap, hypertension, hyperlipidemia, GERD, IBS, arthritis and peripheral neuropathy of feet. She was being evaluated with a virtual colonoscopy about a year ago. A CT of the abdomen showed an opacity in the right lower lobe. She recently went to Dr. Brigitte Pulse for a physical. She ordered a CT chest to follow up the area. It had increased in size. She was referred to Dr. Julien Nordmann who did a PET which showed hypermetabolic activity with an SUV of 3.1. No regional or distant metastasis noted.  She feels well. No CP, pressure or tightness, shortness of breath, fever, chills, cough, wheezing or hemoptysis. No change in appetite or weight loss. Smoked 1 ppd x 30 years before quitting in 2004. Zubrod Score: At the time of surgery this patient's most appropriate activity status/level should be described as: [x]     0    Normal activity, no symptoms []     1    Restricted in physical strenuous activity but ambulatory, able to do out light work []     2    Ambulatory and capable of self care, unable to do work activities, up and about >50 % of waking hours                              []     3    Only limited self care, in bed greater than 50% of waking hours []     4    Completely disabled, no self care, confined to bed or chair []     5    Moribund  Past Medical History:  Diagnosis Date  . Anxiety   . Arthritis    back  . Cancer ([place]) 2000   rt breast cancer  . Family history of breast cancer   . GERD (gastroesophageal reflux disease)   . H/O hiatal hernia   . Hyperlipidemia   . Hypertension   . IBS (irritable bowel syndrome)   . Neck pain   . PONV (postoperative nausea and vomiting)   . Pre-diabetes     Past Surgical History:  Procedure  Laterality Date  . ABDOMINAL HYSTERECTOMY    . APPENDECTOMY    . BACK SURGERY    . BREAST BIOPSY    . BREAST SURGERY    . CARPOMETACARPEL SUSPENSION PLASTY Left 08/25/2015   Procedure: SUSPENSIONPLASTY LEFT THUMB TRAPEZIUM EXCISION ABDUCTOR POLLICIS LONGUS TRANSFER;  Surgeon: Daryll Brod, MD;  Location: [facility];  Service: Orthopedics;  Laterality: Left;  . CHOLECYSTECTOMY    . EYE SURGERY     cataract right and left eye  . GAS INSERTION  06/23/2011   Procedure: INSERTION OF GAS;  Surgeon: Hayden Pedro, MD;  Location: [facility];  Service: Ophthalmology;  Laterality: Left;  SF6  . MASTECTOMY     right breast  . SCLERAL BUCKLE  06/23/2011   Procedure: SCLERAL BUCKLE;  Surgeon: Hayden Pedro, MD;  Location: [facility];  Service: Ophthalmology;  Laterality: Left;  . TONSILLECTOMY      Family History  Problem Relation Age of Onset  . Breast cancer Mother        dx over 62  . Prostate cancer Father  dx late 60s-early 70s  . COPD Father   . Cerebral palsy Brother   . Lung cancer Maternal Uncle        smoker  . Stroke Maternal Grandfather   . Cancer Cousin        NOS  . Breast cancer Daughter 34  . Anesthesia problems Neg Hx   . Hypotension Neg Hx   . Malignant hyperthermia Neg Hx   . Pseudochol deficiency Neg Hx     Social History Social History   Tobacco Use  . Smoking status: Former Smoker    Packs/day: 1.00    Years: 30.00    Pack years: 30.00    Types: Cigarettes    Quit date: 06/22/2002    Years since quitting: 16.0  . Smokeless tobacco: Never Used  Substance Use Topics  . Alcohol use: Yes    Comment: rarely  . Drug use: No    Current Outpatient Medications  Medication Sig Dispense Refill  . aspirin 81 MG tablet Take 81 mg by mouth daily.    Marland Kitchen desipramine (NORPRAMIN) 25 MG tablet Take 25 mg by mouth daily.    Marland Kitchen ezetimibe (ZETIA) 10 MG tablet Take 10 mg by mouth daily.    . hydrochlorothiazide (HYDRODIURIL) 25 MG tablet Take 25 mg by mouth  daily.    Marland Kitchen loratadine-pseudoephedrine (CLARITIN-D 24-HOUR) 10-240 MG 24 hr tablet Take 1 tablet by mouth daily.    Marland Kitchen LORazepam (ATIVAN) 1 MG tablet Take 1 mg by mouth every 8 (eight) hours.    Marland Kitchen losartan (COZAAR) 100 MG tablet Take 100 mg by mouth daily.    . Misc Natural Products (TART CHERRY ADVANCED PO) Take by mouth.    . Multiple Vitamins-Minerals (VITAMIN D3 COMPLETE PO) Take 1 tablet by mouth daily. 1,000 mg    . Omega-3 Fatty Acids (FISH OIL) 1000 MG CAPS Take by mouth.    . pregabalin (LYRICA) 50 MG capsule Take 50 mg by mouth 3 (three) times daily.    . vitamin E 1000 UNIT capsule Take 1 tablet by mouth daily.     No current facility-administered medications for this visit.     Allergies  Allergen Reactions  . Hydrocodone Itching  . Codeine Itching  . Statins Other (See Comments)    Muscle soreness  . Sulfa Antibiotics     Breakout around face    Review of Systems  Constitutional: Negative for activity change, chills, fever and unexpected weight change.  HENT: Negative for trouble swallowing and voice change.   Respiratory: Negative for cough, shortness of breath and wheezing.   Cardiovascular: Negative for chest pain and leg swelling.  Gastrointestinal: Negative for abdominal pain.       IBS. Ventral hernia  Genitourinary: Negative for difficulty urinating and dysuria.  Musculoskeletal: Positive for arthralgias and myalgias.  Neurological: Positive for dizziness. Negative for syncope and weakness.       Neuropathy- feet  Hematological: Negative for adenopathy. Does not bruise/bleed easily.  All other systems reviewed and are negative.   BP 135/61   Pulse 64   Temp 97.8 F (36.6 C)   Resp 18   Wt 144 lb (65.3 kg)   SpO2 98%   BMI 25.51 kg/m  Physical Exam Vitals signs reviewed.  Constitutional:      General: She is not in acute distress.    Appearance: Normal appearance.  HENT:     Head: Normocephalic and atraumatic.     Comments: Wearing surgical  mask Eyes:  General: No scleral icterus.    Extraocular Movements: Extraocular movements intact.     Conjunctiva/sclera: Conjunctivae normal.  Neck:     Musculoskeletal: Neck supple.  Cardiovascular:     Rate and Rhythm: Normal rate and regular rhythm.     Heart sounds: Normal heart sounds. No murmur. No friction rub. No gallop.   Pulmonary:     Effort: Pulmonary effort is normal. No respiratory distress.     Breath sounds: Normal breath sounds. No wheezing or rales.  Abdominal:     General: There is no distension.     Palpations: Abdomen is soft.     Hernia: A hernia (ventral RUQ) is present.  Musculoskeletal:        General: No swelling.  Lymphadenopathy:     Cervical: No cervical adenopathy.  Skin:    General: Skin is warm and dry.  Neurological:     General: No focal deficit present.     Mental Status: She is alert.     Cranial Nerves: No cranial nerve deficit.     Motor: No weakness.     Coordination: Coordination normal.    Diagnostic Tests: CT CHEST WITHOUT CONTRAST  TECHNIQUE: Multidetector CT imaging of the chest was performed following the standard protocol without IV contrast.  COMPARISON:  Abdomen and pelvis CT, 05/05/2017  FINDINGS: Cardiovascular: Heart is normal in size and configuration. Three-vessel coronary artery calcifications. No pericardial effusion. Great vessels are normal in caliber. There are aortic atherosclerotic calcifications.  Mediastinum/Nodes: No neck base or axillary masses or enlarged lymph nodes. Right breast and axillary vascular clips from treatment of previous breast carcinoma.  No mediastinal or hilar masses no adenopathy. Trachea and esophagus are unremarkable.  Lungs/Pleura: Focal opacity tracks along a segmental bronchus in the anterior right lower lobe, which has increased when compared to the prior abdomen and pelvis CT. It measures 13 mm from superior to inferior by 12 mm from anterior to posterior  increased from approximately 10 mm x 7 mm on the prior study respectively.  Small dense nodule, average Hounsfield units of over 70, peripheral left lower lobe, 3 mm, unchanged.  Small calcified granuloma, inferior right upper lobe, image 76, above the field of view from the prior abdomen and pelvis CT.  No other lung nodules. No other left areas of lung opacity. No evidence of pneumonia or pulmonary edema. No pleural effusion or pneumothorax.  Upper Abdomen: 2.7 cm low-density mass, upper pole the right kidney, consistent with a cyst, stable. No acute findings in the upper abdomen.  Musculoskeletal: No fracture or acute finding. No osteoblastic or osteolytic lesions.  IMPRESSION: 1. Focal opacity along the anterior segmental bronchus in the right lower lobe has increased in size from the prior CT. This has a nodular configuration peripherally raising suspicion for neoplastic disease. Recommend consultation with pulmonary/thoracic medicine for consideration of tissue sampling. 2. Small dense nodule, consistent with a granuloma, left lower lobe, 3 mm, stable. Small calcified granuloma in the inferior right lower lobe. No other nodules. 3. No acute findings.  Aortic Atherosclerosis (ICD10-I70.0).   Electronically Signed   By: Lajean Manes M.D.   On: 04/11/2018 20:41 NUCLEAR MEDICINE PET SKULL BASE TO THIGH  TECHNIQUE: 6.7 mCi F-18 FDG was injected intravenously. Full-ring PET imaging was performed from the skull base to thigh after the radiotracer. CT data was obtained and used for attenuation correction and anatomic localization.  Fasting blood glucose: 131 mg/dl  COMPARISON:  Chest CT on 04/11/2018  FINDINGS: Mediastinal blood-pool  activity (background): SUV max = 3.0  Liver activity (reference): SUV max = N/A  NECK:  No hypermetabolic lymph nodes or masses.  Incidental CT findings:  None.  CHEST: No hypermetabolic lymphadenopathy. An  irregular nodule in the anterior right lower lobe measuring 2.5 cm shows mild hypermetabolic activity, with SUV max of 3.1. 3 mm left lower lobe pulmonary nodule shows no FDG uptake, and is stable compared to earlier CT of 05/05/2017.  Incidental CT findings:  Aortic and coronary artery atherosclerosis.  ABDOMEN/PELVIS: No abnormal hypermetabolic activity within the liver, pancreas, adrenal glands, or spleen. No hypermetabolic lymph nodes in the abdomen or pelvis.  Incidental CT findings: Mild diverticulosis is seen involving the sigmoid colon, however there is no evidence of diverticulitis. Prior hysterectomy noted.  SKELETON: No focal hypermetabolic bone lesions to suggest skeletal metastasis.  Incidental CT findings:  None.  IMPRESSION: 2.5 cm right lower lobe pulmonary nodule shows mild hypermetabolic activity. Differential diagnosis includes low-grade bronchogenic carcinoma versus inflammatory process.  No evidence of local or distant metastatic disease.   Electronically Signed   By: Earle Gell M.D.   On: 06/28/2018 14:22 I personally reviewed the CT and PET images and concur with the findings noted above  Impression: Vanessa Day is a 78 yo former smoker with a history of breast cancer who was found to have an abnormality of the right lower lobe on a CT of the abdomen a year ago. Recently a follow up Ct chest showed the nodule had increased in size and it is hypermetabolic on PET. Findings are concerning for a low grade adenocarcinoma. Infectious and inflammatory diseases are also in the differential.  I discussed options for diagnosis and treatment with Vanessa Day. We discussed bronchoscopy for biopsy v proceeding directly to surgical resection. We discussed the relative advantages and disadvantages of both approaches. I recommended we proceed to VATS for definitive diagnosis and treatment of the mass.  I discussed the general nature of the procedure, the  need for general anesthesia, the incisions to be used and the use of a drainage tube postoperatively with Vanessa Day. We discussed the expected hospital stay, overall recovery and short and long term outcomes. I informed her of the indications, risks, benefits and alternatives. She understands the risks include, but are not limited to death, stroke, MI, DVT/PE, bleeding, possible need for transfusion, infections, cardiac arrhythmias as well as other organ system dysfunction including respiratory, renal, or GI complications.   She accepts the risks and agrees to proceed.  Plan: PFTS Right VATS for right lower lobe segmentectomy, possible lower lobectomy Will call patient to schedule  Melrose Nakayama, MD [facility] [phone]

## 2018-07-12 NOTE — Progress Notes (Signed)
The proposed treatment discussed in cancer conference 07/12/2018 is for discussion purpose only and is not a binding recommendation.  The patient was not physically examined nor present for their treatment options.  Therefore, final treatment plans cannot be decided.

## 2018-07-12 NOTE — Progress Notes (Signed)
PCP is [name], MD Referring Provider is Curt Bears, MD  No chief complaint on file.   HPI: 78 yo woman sent for consultation re: right lower lobe mass  Vanessa Day is a 78 yo woman with a past history of breast cancer, right mastectomy, TRAM flap, hypertension, hyperlipidemia, GERD, IBS, arthritis and peripheral neuropathy of feet. She was being evaluated with a virtual colonoscopy about a year ago. A CT of the abdomen showed an opacity in the right lower lobe. She recently went to Dr. Brigitte Pulse for a physical. She ordered a CT chest to follow up the area. It had increased in size. She was referred to Dr. Julien Nordmann who did a PET which showed hypermetabolic activity with an SUV of 3.1. No regional or distant metastasis noted.  She feels well. No CP, pressure or tightness, shortness of breath, fever, chills, cough, wheezing or hemoptysis. No change in appetite or weight loss. Smoked 1 ppd x 30 years before quitting in 2004. Zubrod Score: At the time of surgery this patient's most appropriate activity status/level should be described as: [x]     0    Normal activity, no symptoms []     1    Restricted in physical strenuous activity but ambulatory, able to do out light work []     2    Ambulatory and capable of self care, unable to do work activities, up and about >50 % of waking hours                              []     3    Only limited self care, in bed greater than 50% of waking hours []     4    Completely disabled, no self care, confined to bed or chair []     5    Moribund  Past Medical History:  Diagnosis Date  . Anxiety   . Arthritis    back  . Cancer ([place]) 2000   rt breast cancer  . Family history of breast cancer   . GERD (gastroesophageal reflux disease)   . H/O hiatal hernia   . Hyperlipidemia   . Hypertension   . IBS (irritable bowel syndrome)   . Neck pain   . PONV (postoperative nausea and vomiting)   . Pre-diabetes     Past Surgical History:  Procedure  Laterality Date  . ABDOMINAL HYSTERECTOMY    . APPENDECTOMY    . BACK SURGERY    . BREAST BIOPSY    . BREAST SURGERY    . CARPOMETACARPEL SUSPENSION PLASTY Left 08/25/2015   Procedure: SUSPENSIONPLASTY LEFT THUMB TRAPEZIUM EXCISION ABDUCTOR POLLICIS LONGUS TRANSFER;  Surgeon: Daryll Brod, MD;  Location: [facility];  Service: Orthopedics;  Laterality: Left;  . CHOLECYSTECTOMY    . EYE SURGERY     cataract right and left eye  . GAS INSERTION  06/23/2011   Procedure: INSERTION OF GAS;  Surgeon: Hayden Pedro, MD;  Location: [facility];  Service: Ophthalmology;  Laterality: Left;  SF6  . MASTECTOMY     right breast  . SCLERAL BUCKLE  06/23/2011   Procedure: SCLERAL BUCKLE;  Surgeon: Hayden Pedro, MD;  Location: [facility];  Service: Ophthalmology;  Laterality: Left;  . TONSILLECTOMY      Family History  Problem Relation Age of Onset  . Breast cancer Mother        dx over 55  . Prostate cancer Father  dx late 60s-early 70s  . COPD Father   . Cerebral palsy Brother   . Lung cancer Maternal Uncle        smoker  . Stroke Maternal Grandfather   . Cancer Cousin        NOS  . Breast cancer Daughter 35  . Anesthesia problems Neg Hx   . Hypotension Neg Hx   . Malignant hyperthermia Neg Hx   . Pseudochol deficiency Neg Hx     Social History Social History   Tobacco Use  . Smoking status: Former Smoker    Packs/day: 1.00    Years: 30.00    Pack years: 30.00    Types: Cigarettes    Quit date: 06/22/2002    Years since quitting: 16.0  . Smokeless tobacco: Never Used  Substance Use Topics  . Alcohol use: Yes    Comment: rarely  . Drug use: No    Current Outpatient Medications  Medication Sig Dispense Refill  . aspirin 81 MG tablet Take 81 mg by mouth daily.    Marland Kitchen desipramine (NORPRAMIN) 25 MG tablet Take 25 mg by mouth daily.    Marland Kitchen ezetimibe (ZETIA) 10 MG tablet Take 10 mg by mouth daily.    . hydrochlorothiazide (HYDRODIURIL) 25 MG tablet Take 25 mg by mouth  daily.    Marland Kitchen loratadine-pseudoephedrine (CLARITIN-D 24-HOUR) 10-240 MG 24 hr tablet Take 1 tablet by mouth daily.    Marland Kitchen LORazepam (ATIVAN) 1 MG tablet Take 1 mg by mouth every 8 (eight) hours.    Marland Kitchen losartan (COZAAR) 100 MG tablet Take 100 mg by mouth daily.    . Misc Natural Products (TART CHERRY ADVANCED PO) Take by mouth.    . Multiple Vitamins-Minerals (VITAMIN D3 COMPLETE PO) Take 1 tablet by mouth daily. 1,000 mg    . Omega-3 Fatty Acids (FISH OIL) 1000 MG CAPS Take by mouth.    . pregabalin (LYRICA) 50 MG capsule Take 50 mg by mouth 3 (three) times daily.    . vitamin E 1000 UNIT capsule Take 1 tablet by mouth daily.     No current facility-administered medications for this visit.     Allergies  Allergen Reactions  . Hydrocodone Itching  . Codeine Itching  . Statins Other (See Comments)    Muscle soreness  . Sulfa Antibiotics     Breakout around face    Review of Systems  Constitutional: Negative for activity change, chills, fever and unexpected weight change.  HENT: Negative for trouble swallowing and voice change.   Respiratory: Negative for cough, shortness of breath and wheezing.   Cardiovascular: Negative for chest pain and leg swelling.  Gastrointestinal: Negative for abdominal pain.       IBS. Ventral hernia  Genitourinary: Negative for difficulty urinating and dysuria.  Musculoskeletal: Positive for arthralgias and myalgias.  Neurological: Positive for dizziness. Negative for syncope and weakness.       Neuropathy- feet  Hematological: Negative for adenopathy. Does not bruise/bleed easily.  All other systems reviewed and are negative.   BP 135/61   Pulse 64   Temp 97.8 F (36.6 C)   Resp 18   Wt 144 lb (65.3 kg)   SpO2 98%   BMI 25.51 kg/m  Physical Exam Vitals signs reviewed.  Constitutional:      General: She is not in acute distress.    Appearance: Normal appearance.  HENT:     Head: Normocephalic and atraumatic.     Comments: Wearing surgical  mask Eyes:  General: No scleral icterus.    Extraocular Movements: Extraocular movements intact.     Conjunctiva/sclera: Conjunctivae normal.  Neck:     Musculoskeletal: Neck supple.  Cardiovascular:     Rate and Rhythm: Normal rate and regular rhythm.     Heart sounds: Normal heart sounds. No murmur. No friction rub. No gallop.   Pulmonary:     Effort: Pulmonary effort is normal. No respiratory distress.     Breath sounds: Normal breath sounds. No wheezing or rales.  Abdominal:     General: There is no distension.     Palpations: Abdomen is soft.     Hernia: A hernia (ventral RUQ) is present.  Musculoskeletal:        General: No swelling.  Lymphadenopathy:     Cervical: No cervical adenopathy.  Skin:    General: Skin is warm and dry.  Neurological:     General: No focal deficit present.     Mental Status: She is alert.     Cranial Nerves: No cranial nerve deficit.     Motor: No weakness.     Coordination: Coordination normal.    Diagnostic Tests: CT CHEST WITHOUT CONTRAST  TECHNIQUE: Multidetector CT imaging of the chest was performed following the standard protocol without IV contrast.  COMPARISON:  Abdomen and pelvis CT, 05/05/2017  FINDINGS: Cardiovascular: Heart is normal in size and configuration. Three-vessel coronary artery calcifications. No pericardial effusion. Great vessels are normal in caliber. There are aortic atherosclerotic calcifications.  Mediastinum/Nodes: No neck base or axillary masses or enlarged lymph nodes. Right breast and axillary vascular clips from treatment of previous breast carcinoma.  No mediastinal or hilar masses no adenopathy. Trachea and esophagus are unremarkable.  Lungs/Pleura: Focal opacity tracks along a segmental bronchus in the anterior right lower lobe, which has increased when compared to the prior abdomen and pelvis CT. It measures 13 mm from superior to inferior by 12 mm from anterior to posterior  increased from approximately 10 mm x 7 mm on the prior study respectively.  Small dense nodule, average Hounsfield units of over 70, peripheral left lower lobe, 3 mm, unchanged.  Small calcified granuloma, inferior right upper lobe, image 76, above the field of view from the prior abdomen and pelvis CT.  No other lung nodules. No other left areas of lung opacity. No evidence of pneumonia or pulmonary edema. No pleural effusion or pneumothorax.  Upper Abdomen: 2.7 cm low-density mass, upper pole the right kidney, consistent with a cyst, stable. No acute findings in the upper abdomen.  Musculoskeletal: No fracture or acute finding. No osteoblastic or osteolytic lesions.  IMPRESSION: 1. Focal opacity along the anterior segmental bronchus in the right lower lobe has increased in size from the prior CT. This has a nodular configuration peripherally raising suspicion for neoplastic disease. Recommend consultation with pulmonary/thoracic medicine for consideration of tissue sampling. 2. Small dense nodule, consistent with a granuloma, left lower lobe, 3 mm, stable. Small calcified granuloma in the inferior right lower lobe. No other nodules. 3. No acute findings.  Aortic Atherosclerosis (ICD10-I70.0).   Electronically Signed   By: Lajean Manes M.D.   On: 04/11/2018 20:41 NUCLEAR MEDICINE PET SKULL BASE TO THIGH  TECHNIQUE: 6.7 mCi F-18 FDG was injected intravenously. Full-ring PET imaging was performed from the skull base to thigh after the radiotracer. CT data was obtained and used for attenuation correction and anatomic localization.  Fasting blood glucose: 131 mg/dl  COMPARISON:  Chest CT on 04/11/2018  FINDINGS: Mediastinal blood-pool  activity (background): SUV max = 3.0  Liver activity (reference): SUV max = N/A  NECK:  No hypermetabolic lymph nodes or masses.  Incidental CT findings:  None.  CHEST: No hypermetabolic lymphadenopathy. An  irregular nodule in the anterior right lower lobe measuring 2.5 cm shows mild hypermetabolic activity, with SUV max of 3.1. 3 mm left lower lobe pulmonary nodule shows no FDG uptake, and is stable compared to earlier CT of 05/05/2017.  Incidental CT findings:  Aortic and coronary artery atherosclerosis.  ABDOMEN/PELVIS: No abnormal hypermetabolic activity within the liver, pancreas, adrenal glands, or spleen. No hypermetabolic lymph nodes in the abdomen or pelvis.  Incidental CT findings: Mild diverticulosis is seen involving the sigmoid colon, however there is no evidence of diverticulitis. Prior hysterectomy noted.  SKELETON: No focal hypermetabolic bone lesions to suggest skeletal metastasis.  Incidental CT findings:  None.  IMPRESSION: 2.5 cm right lower lobe pulmonary nodule shows mild hypermetabolic activity. Differential diagnosis includes low-grade bronchogenic carcinoma versus inflammatory process.  No evidence of local or distant metastatic disease.   Electronically Signed   By: Earle Gell M.D.   On: 06/28/2018 14:22 I personally reviewed the CT and PET images and concur with the findings noted above  Impression: Vanessa Day is a 78 yo former smoker with a history of breast cancer who was found to have an abnormality of the right lower lobe on a CT of the abdomen a year ago. Recently a follow up Ct chest showed the nodule had increased in size and it is hypermetabolic on PET. Findings are concerning for a low grade adenocarcinoma. Infectious and inflammatory diseases are also in the differential.  I discussed options for diagnosis and treatment with Vanessa Day. We discussed bronchoscopy for biopsy v proceeding directly to surgical resection. We discussed the relative advantages and disadvantages of both approaches. I recommended we proceed to VATS for definitive diagnosis and treatment of the mass.  I discussed the general nature of the procedure, the  need for general anesthesia, the incisions to be used and the use of a drainage tube postoperatively with Vanessa Day. We discussed the expected hospital stay, overall recovery and short and long term outcomes. I informed her of the indications, risks, benefits and alternatives. She understands the risks include, but are not limited to death, stroke, MI, DVT/PE, bleeding, possible need for transfusion, infections, cardiac arrhythmias as well as other organ system dysfunction including respiratory, renal, or GI complications.   She accepts the risks and agrees to proceed.  Plan: PFTS Right VATS for right lower lobe segmentectomy, possible lower lobectomy Will call patient to schedule  Melrose Nakayama, MD [facility] [phone]

## 2018-07-13 ENCOUNTER — Encounter: Payer: Self-pay | Admitting: *Deleted

## 2018-07-13 ENCOUNTER — Other Ambulatory Visit: Payer: Self-pay | Admitting: *Deleted

## 2018-07-13 DIAGNOSIS — R911 Solitary pulmonary nodule: Secondary | ICD-10-CM

## 2018-07-13 NOTE — Progress Notes (Signed)
Oncology Nurse Navigator Documentation  Oncology Nurse Navigator Flowsheets 07/13/2018  Navigator Location [facility]  Referral Date to RadOnc/MedOnc -  Navigator Encounter Type Clinic/MDC/I spoke with [name] at thoracic clinic yesterday.  Her treatment plan is surgical resection.  I help to explain next steps with verbal and written information (Lung Cancer Booklet).  I faxed orders from Dr. Roxan Hockey to [facility] and called today to make sure the orders have been received, they received them.   Telephone -  Abnormal Finding Date 04/11/2018  Multidisiplinary Clinic Date 07/12/2018  Patient Visit Type MedOnc  Treatment Phase Abnormal Scans  Barriers/Navigation Needs Coordination of Care;Education  Education Other  Interventions Coordination of Care;Education  Coordination of Care Other  Education Method Verbal;Written  Support Groups/Services Other  Acuity Level 2  Time Spent with Patient 70

## 2018-07-16 ENCOUNTER — Encounter: Payer: Self-pay | Admitting: *Deleted

## 2018-07-16 ENCOUNTER — Telehealth: Payer: Self-pay | Admitting: *Deleted

## 2018-07-16 NOTE — Telephone Encounter (Signed)
Oncology Nurse Navigator Documentation  Oncology Nurse Navigator Flowsheets 07/16/2018  Navigator Location [facility]  Referral Date to RadOnc/MedOnc -  Navigator Encounter Type Telephone/I received a message that Vanessa Day had questions about her upcoming surgery.  I called to clarify concerns.  She was also wondering if she needed to be quarantine for 14 days after she comes home from surgery.  I called thoracic surgery office and was told no.  I did tell her that after her covid test she needed to stay at home and social distance until surgery.  She verbalized understanding.   Telephone Outgoing Call  Abnormal Finding Date -  Multidisiplinary Clinic Date -  Patient Visit Type -  Treatment Phase Abnormal Scans  Barriers/Navigation Needs Education  Education -  Interventions Education  Coordination of Care -  Education Method Verbal  Support Groups/Services -  Acuity Level 1  Time Spent with Patient 30

## 2018-07-20 NOTE — Progress Notes (Signed)
[id number] [place], [place] [address] [place] [address] Phone: [phone] Fax: [phone]      Your procedure is scheduled on July 2nd.  Report to [facility] Main Entrance "A" at 6:00 A.M., and check in at the Admitting office.  Call this number if you have problems the morning of surgery:  [phone]  Call [phone] if you have any questions prior to your surgery date Monday-Friday 8am-4pm    Remember:  Do not eat or drink after midnight.    Take these medicines the morning of surgery with A SIP OF WATER   Celebrex  Desipramine (Norpramin)  Ezetimibe (Zetia)  Claritin - if needed  Nasal Spray - if needed  Eye drops - if needed  Pregabalin (Lyrica)  Tramadol - if needed  Follow your surgeon's instructions on when to stop Aspirin.  If no instructions were given by your surgeon then you will need to call the office to get those instructions.    7 days prior to surgery STOP taking any Aspirin (unless otherwise instructed by your surgeon), Aleve, Naproxen, Ibuprofen, Motrin, Advil, Goody's, BC's, all herbal medications, fish oil, and all vitamins.    The Morning of Surgery  Do not wear jewelry, make-up or nail polish.  Do not wear lotions, powders, or perfumes, or deodorant  Do not shave 48 hours prior to surgery.    Do not bring valuables to the hospital.  [facility] is not responsible for any belongings or valuables.  If you are a smoker, DO NOT Smoke 24 hours prior to surgery IF you wear a CPAP at night please bring your mask, tubing, and machine the morning of surgery   Remember that you must have someone to transport you home after your surgery, and remain with you for 24 hours if you are discharged the same day.   Contacts, glasses, hearing aids, dentures or bridgework may not be worn into surgery.    Leave your suitcase in the car.  After surgery it may be brought to  your room.  For patients admitted to the hospital, discharge time will be determined by your treatment team.  Patients discharged the day of surgery will not be allowed to drive home.    Special instructions:   [facility]- Preparing For Surgery  Before surgery, you can play an important role. Because skin is not sterile, your skin needs to be as free of germs as possible. You can reduce the number of germs on your skin by washing with CHG (chlorahexidine gluconate) Soap before surgery.  CHG is an antiseptic cleaner which kills germs and bonds with the skin to continue killing germs even after washing.    Oral Hygiene is also important to reduce your risk of infection.  Remember - BRUSH YOUR TEETH THE MORNING OF SURGERY WITH YOUR REGULAR TOOTHPASTE  Please do not use if you have an allergy to CHG or antibacterial soaps. If your skin becomes reddened/irritated stop using the CHG.  Do not shave (including legs and underarms) for at least 48 hours prior to first CHG shower. It is OK to shave your face.  Please follow these instructions carefully.   1. Shower the NIGHT BEFORE SURGERY and the MORNING OF SURGERY with CHG Soap.   2. If you chose to wash your hair, wash your hair first as usual with your normal shampoo.  3. After you shampoo, rinse your hair and body  thoroughly to remove the shampoo.  4. Use CHG as you would any other liquid soap. You can apply CHG directly to the skin and wash gently with a scrungie or a clean washcloth.   5. Apply the CHG Soap to your body ONLY FROM THE NECK DOWN.  Do not use on open wounds or open sores. Avoid contact with your eyes, ears, mouth and genitals (private parts). Wash Face and genitals (private parts)  with your normal soap.   6. Wash thoroughly, paying special attention to the area where your surgery will be performed.  7. Thoroughly rinse your body with warm water from the neck down.  8. DO NOT shower/wash with your normal soap after using  and rinsing off the CHG Soap.  9. Pat yourself dry with a CLEAN TOWEL.  10. Wear CLEAN PAJAMAS to bed the night before surgery, wear comfortable clothes the morning of surgery  11. Place CLEAN SHEETS on your bed the night of your first shower and DO NOT SLEEP WITH PETS.   Day of Surgery:  Do not apply any deodorants/lotions.  Please wear clean clothes to the hospital/surgery center.   Remember to brush your teeth WITH YOUR REGULAR TOOTHPASTE.   Please read over the following fact sheets that you were given.

## 2018-07-23 ENCOUNTER — Ambulatory Visit (HOSPITAL_COMMUNITY)
Admission: RE | Admit: 2018-07-23 | Discharge: 2018-07-23 | Disposition: A | Discharge status: Home / Self Care | Payer: Medicare Other | Source: Ambulatory Visit | Attending: Thoracic Surgery (Cardiothoracic Vascular Surgery) | Admitting: Thoracic Surgery (Cardiothoracic Vascular Surgery)

## 2018-07-23 ENCOUNTER — Other Ambulatory Visit: Payer: Self-pay

## 2018-07-23 ENCOUNTER — Other Ambulatory Visit (HOSPITAL_COMMUNITY)
Admission: RE | Admit: 2018-07-23 | Discharge: 2018-07-23 | Disposition: A | Discharge status: Home / Self Care | Payer: Medicare Other | Source: Ambulatory Visit | Attending: Thoracic Surgery (Cardiothoracic Vascular Surgery) | Admitting: Thoracic Surgery (Cardiothoracic Vascular Surgery)

## 2018-07-23 ENCOUNTER — Encounter (HOSPITAL_COMMUNITY)
Admission: RE | Admit: 2018-07-23 | Discharge: 2018-07-23 | Disposition: A | Discharge status: Home / Self Care | Payer: Medicare Other | Source: Ambulatory Visit | Attending: Thoracic Surgery (Cardiothoracic Vascular Surgery) | Admitting: Thoracic Surgery (Cardiothoracic Vascular Surgery)

## 2018-07-23 ENCOUNTER — Encounter (HOSPITAL_COMMUNITY): Payer: Self-pay

## 2018-07-23 DIAGNOSIS — R911 Solitary pulmonary nodule: Secondary | ICD-10-CM

## 2018-07-23 DIAGNOSIS — Z1159 Encounter for screening for other viral diseases: Secondary | ICD-10-CM | POA: Insufficient documentation

## 2018-07-23 DIAGNOSIS — Z01818 Encounter for other preprocedural examination: Secondary | ICD-10-CM | POA: Insufficient documentation

## 2018-07-23 LAB — COMPREHENSIVE METABOLIC PANEL
ALT: 15 U/L (ref 0–44)
AST: 19 U/L (ref 15–41)
Albumin: 3.5 g/dL (ref 3.5–5.0)
Alkaline Phosphatase: 60 U/L (ref 38–126)
Anion gap: 14 (ref 5–15)
BUN: 28 mg/dL — ABNORMAL HIGH (ref 8–23)
CO2: 17 mmol/L — ABNORMAL LOW (ref 22–32)
Calcium: 9.1 mg/dL (ref 8.9–10.3)
Chloride: 105 mmol/L (ref 98–111)
Creatinine, Ser: 1.15 mg/dL — ABNORMAL HIGH (ref 0.44–1.00)
GFR calc Af Amer: 53 mL/min — ABNORMAL LOW (ref >60)
GFR calc non Af Amer: 46 mL/min — ABNORMAL LOW (ref >60)
Glucose, Bld: 117 mg/dL — ABNORMAL HIGH (ref 70–99)
Potassium: 3.9 mmol/L (ref 3.5–5.1)
Sodium: 136 mmol/L (ref 135–145)
Total Bilirubin: 1.2 mg/dL (ref 0.3–1.2)
Total Protein: 6.6 g/dL (ref 6.5–8.1)

## 2018-07-23 LAB — ABO/RH: ABO/RH(D): A NEG

## 2018-07-23 LAB — BLOOD GAS, ARTERIAL
Acid-base deficit: 0.5 mmol/L (ref 0.0–2.0)
Bicarbonate: 23.3 mmol/L (ref 20.0–28.0)
Drawn by: 47059
O2 Saturation: 98.7 %
Patient temperature: 98.6
pCO2 arterial: 35.8 mmHg (ref 32.0–48.0)
pH, Arterial: 7.429 (ref 7.350–7.450)
pO2, Arterial: 143 mmHg — ABNORMAL HIGH (ref 83.0–108.0)

## 2018-07-23 LAB — CBC
HCT: 38.3 % (ref 36.0–46.0)
Hemoglobin: 12.9 g/dL (ref 12.0–15.0)
MCH: 30.9 pg (ref 26.0–34.0)
MCHC: 33.7 g/dL (ref 30.0–36.0)
MCV: 91.6 fL (ref 80.0–100.0)
Platelets: 283 10*3/uL (ref 150–400)
RBC: 4.18 MIL/uL (ref 3.87–5.11)
RDW: 12.9 % (ref 11.5–15.5)
WBC: 8.4 10*3/uL (ref 4.0–10.5)
nRBC: 0 % (ref 0.0–0.2)

## 2018-07-23 LAB — SURGICAL PCR SCREEN
MRSA, PCR: NEGATIVE
Staphylococcus aureus: NEGATIVE

## 2018-07-23 LAB — SARS CORONAVIRUS 2 (TAT 6-24 HRS): SARS Coronavirus 2: NEGATIVE

## 2018-07-23 LAB — TYPE AND SCREEN
ABO/RH(D): A NEG
Antibody Screen: NEGATIVE

## 2018-07-23 LAB — PROTIME-INR
INR: 0.9 (ref 0.8–1.2)
Prothrombin Time: 12.2 seconds (ref 11.4–15.2)

## 2018-07-23 LAB — APTT: aPTT: 34 seconds (ref 24–36)

## 2018-07-23 LAB — GLUCOSE, CAPILLARY: Glucose-Capillary: 97 mg/dL (ref 70–99)

## 2018-07-23 NOTE — Progress Notes (Signed)
Patient unable to urinate at PAT appointment.  She was given several cups of water to drink and waited about an hour with no results.  She will bring urine sample in day of procedure as she is unable to wait any longer due to [place] screening.  Instructions given to patient to bring sample in the morning of her procedure and she verbalized understanding.

## 2018-07-23 NOTE — Progress Notes (Addendum)
PCP - [name], MD Cardiologist - pt denies  Chest x-ray - 07/23/2018 at PAT EKG - 07/23/2018  Stress Test - pt denies ECHO -  Pt denies  Cardiac Cath - pt denies  Sleep Study - n/a CPAP - n/a  Fasting Blood Sugar - n/a Checks Blood Sugar _____ times a day-n/a  Blood Thinner Instructions: Aspirin Instructions: Aspirin 81 mg -no instructions given, patient calling Dr. Leonarda Salon office today  Anesthesia review: Yes-EKG  Patient denies shortness of breath, fever, cough and chest pain at PAT appointment  Patient verbalized understanding of instructions that were given to them at the PAT appointment. Patient was also instructed that they will need to review over the PAT instructions again at home before surgery.

## 2018-07-25 ENCOUNTER — Inpatient Hospital Stay (HOSPITAL_COMMUNITY): Admission: RE | Admit: 2018-07-25 | Payer: Medicare Other | Source: Ambulatory Visit

## 2018-07-25 ENCOUNTER — Other Ambulatory Visit: Payer: Medicare Other

## 2018-07-25 ENCOUNTER — Other Ambulatory Visit: Payer: Self-pay

## 2018-07-25 ENCOUNTER — Ambulatory Visit (HOSPITAL_COMMUNITY)
Admission: RE | Admit: 2018-07-25 | Discharge: 2018-07-25 | Disposition: A | Discharge status: Home / Self Care | Payer: Medicare Other | Source: Ambulatory Visit | Attending: Thoracic Surgery (Cardiothoracic Vascular Surgery) | Admitting: Thoracic Surgery (Cardiothoracic Vascular Surgery)

## 2018-07-25 DIAGNOSIS — R918 Other nonspecific abnormal finding of lung field: Secondary | ICD-10-CM

## 2018-07-25 LAB — PULMONARY FUNCTION TEST
DL/VA % pred: 91 %
DL/VA: 3.77 ml/min/mmHg/L
DLCO cor % pred: 84 %
DLCO cor: 15.44 ml/min/mmHg
DLCO unc % pred: 83 %
DLCO unc: 15.2 ml/min/mmHg
FEF 25-75 Post: 1.7 L/sec
FEF 25-75 Pre: 1.72 L/sec
FEF2575-%Change-Post: 0 %
FEF2575-%Pred-Post: 117 %
FEF2575-%Pred-Pre: 118 %
FEV1-%Change-Post: 0 %
FEV1-%Pred-Post: 103 %
FEV1-%Pred-Pre: 102 %
FEV1-Post: 1.98 L
FEV1-Pre: 1.97 L
FEV1FVC-%Change-Post: -3 %
FEV1FVC-%Pred-Pre: 104 %
FEV6-%Change-Post: 4 %
FEV6-%Pred-Post: 108 %
FEV6-%Pred-Pre: 104 %
FEV6-Post: 2.64 L
FEV6-Pre: 2.54 L
FEV6FVC-%Change-Post: 0 %
FEV6FVC-%Pred-Post: 105 %
FEV6FVC-%Pred-Pre: 105 %
FVC-%Change-Post: 4 %
FVC-%Pred-Post: 102 %
FVC-%Pred-Pre: 98 %
FVC-Post: 2.64 L
FVC-Pre: 2.54 L
Post FEV1/FVC ratio: 75 %
Post FEV6/FVC ratio: 100 %
Pre FEV1/FVC ratio: 78 %
Pre FEV6/FVC Ratio: 100 %
RV % pred: 115 %
RV: 2.66 L
TLC % pred: 105 %
TLC: 5.19 L

## 2018-07-25 MED ORDER — ALBUTEROL SULFATE (2.5 MG/3ML) 0.083% IN NEBU
2.5000 mg | INHALATION_SOLUTION | Freq: Once | RESPIRATORY_TRACT | Status: AC
  Administered 2018-07-25: 2.5 mg via RESPIRATORY_TRACT

## 2018-07-26 ENCOUNTER — Encounter (HOSPITAL_COMMUNITY)
Admission: RE | Disposition: A | Discharge status: Home / Self Care | Payer: Self-pay | Source: Home / Self Care | Attending: Thoracic Surgery (Cardiothoracic Vascular Surgery)

## 2018-07-26 ENCOUNTER — Encounter (HOSPITAL_COMMUNITY): Payer: Self-pay | Admitting: Surgery

## 2018-07-26 ENCOUNTER — Inpatient Hospital Stay (HOSPITAL_COMMUNITY): Payer: Medicare Other | Admitting: Physician Assistant

## 2018-07-26 ENCOUNTER — Inpatient Hospital Stay (HOSPITAL_COMMUNITY): Payer: Medicare Other | Admitting: Certified Registered"

## 2018-07-26 ENCOUNTER — Inpatient Hospital Stay (HOSPITAL_COMMUNITY)
Admission: RE | Admit: 2018-07-26 | Discharge: 2018-08-02 | DRG: 164 | Disposition: A | Discharge status: Home / Self Care | Payer: Medicare Other | Attending: Thoracic Surgery (Cardiothoracic Vascular Surgery) | Admitting: Thoracic Surgery (Cardiothoracic Vascular Surgery)

## 2018-07-26 ENCOUNTER — Other Ambulatory Visit: Payer: Self-pay

## 2018-07-26 ENCOUNTER — Inpatient Hospital Stay (HOSPITAL_COMMUNITY): Payer: Medicare Other

## 2018-07-26 DIAGNOSIS — Z79899 Other long term (current) drug therapy: Secondary | ICD-10-CM

## 2018-07-26 DIAGNOSIS — Z888 Allergy status to other drugs, medicaments and biological substances status: Secondary | ICD-10-CM

## 2018-07-26 DIAGNOSIS — Z87891 Personal history of nicotine dependence: Secondary | ICD-10-CM

## 2018-07-26 DIAGNOSIS — Z90710 Acquired absence of both cervix and uterus: Secondary | ICD-10-CM | POA: Diagnosis not present

## 2018-07-26 DIAGNOSIS — Z803 Family history of malignant neoplasm of breast: Secondary | ICD-10-CM

## 2018-07-26 DIAGNOSIS — Z801 Family history of malignant neoplasm of trachea, bronchus and lung: Secondary | ICD-10-CM

## 2018-07-26 DIAGNOSIS — Z885 Allergy status to narcotic agent status: Secondary | ICD-10-CM

## 2018-07-26 DIAGNOSIS — G629 Polyneuropathy, unspecified: Secondary | ICD-10-CM | POA: Diagnosis present

## 2018-07-26 DIAGNOSIS — Z882 Allergy status to sulfonamides status: Secondary | ICD-10-CM

## 2018-07-26 DIAGNOSIS — R490 Dysphonia: Secondary | ICD-10-CM | POA: Diagnosis not present

## 2018-07-26 DIAGNOSIS — C3491 Malignant neoplasm of unspecified part of right bronchus or lung: Secondary | ICD-10-CM

## 2018-07-26 DIAGNOSIS — I2510 Atherosclerotic heart disease of native coronary artery without angina pectoris: Secondary | ICD-10-CM | POA: Diagnosis present

## 2018-07-26 DIAGNOSIS — E785 Hyperlipidemia, unspecified: Secondary | ICD-10-CM | POA: Diagnosis present

## 2018-07-26 DIAGNOSIS — R7303 Prediabetes: Secondary | ICD-10-CM | POA: Diagnosis present

## 2018-07-26 DIAGNOSIS — D62 Acute posthemorrhagic anemia: Secondary | ICD-10-CM | POA: Diagnosis not present

## 2018-07-26 DIAGNOSIS — Z9011 Acquired absence of right breast and nipple: Secondary | ICD-10-CM

## 2018-07-26 DIAGNOSIS — K219 Gastro-esophageal reflux disease without esophagitis: Secondary | ICD-10-CM | POA: Diagnosis present

## 2018-07-26 DIAGNOSIS — R911 Solitary pulmonary nodule: Secondary | ICD-10-CM

## 2018-07-26 DIAGNOSIS — Z20828 Contact with and (suspected) exposure to other viral communicable diseases: Secondary | ICD-10-CM | POA: Diagnosis present

## 2018-07-26 DIAGNOSIS — C3431 Malignant neoplasm of lower lobe, right bronchus or lung: Secondary | ICD-10-CM | POA: Diagnosis not present

## 2018-07-26 DIAGNOSIS — K589 Irritable bowel syndrome without diarrhea: Secondary | ICD-10-CM | POA: Diagnosis present

## 2018-07-26 DIAGNOSIS — J939 Pneumothorax, unspecified: Secondary | ICD-10-CM

## 2018-07-26 DIAGNOSIS — Z7982 Long term (current) use of aspirin: Secondary | ICD-10-CM

## 2018-07-26 DIAGNOSIS — Z9049 Acquired absence of other specified parts of digestive tract: Secondary | ICD-10-CM | POA: Diagnosis not present

## 2018-07-26 DIAGNOSIS — K439 Ventral hernia without obstruction or gangrene: Secondary | ICD-10-CM | POA: Diagnosis present

## 2018-07-26 DIAGNOSIS — Z902 Acquired absence of lung [part of]: Secondary | ICD-10-CM

## 2018-07-26 DIAGNOSIS — J9382 Other air leak: Secondary | ICD-10-CM | POA: Diagnosis not present

## 2018-07-26 DIAGNOSIS — Z853 Personal history of malignant neoplasm of breast: Secondary | ICD-10-CM | POA: Diagnosis not present

## 2018-07-26 DIAGNOSIS — G540 Brachial plexus disorders: Secondary | ICD-10-CM | POA: Diagnosis not present

## 2018-07-26 DIAGNOSIS — J069 Acute upper respiratory infection, unspecified: Secondary | ICD-10-CM | POA: Diagnosis not present

## 2018-07-26 DIAGNOSIS — F419 Anxiety disorder, unspecified: Secondary | ICD-10-CM | POA: Diagnosis present

## 2018-07-26 DIAGNOSIS — I10 Essential (primary) hypertension: Secondary | ICD-10-CM | POA: Diagnosis present

## 2018-07-26 HISTORY — PX: SEGMENTECOMY: SHX5076

## 2018-07-26 HISTORY — PX: LOBECTOMY: SHX5089

## 2018-07-26 HISTORY — PX: VIDEO ASSISTED THORACOSCOPY: SHX5073

## 2018-07-26 LAB — GLUCOSE, CAPILLARY
Glucose-Capillary: 124 mg/dL — ABNORMAL HIGH (ref 70–99)
Glucose-Capillary: 155 mg/dL — ABNORMAL HIGH (ref 70–99)

## 2018-07-26 SURGERY — VIDEO ASSISTED THORACOSCOPY
Anesthesia: General | Site: Chest | Laterality: Right

## 2018-07-26 MED ORDER — PROPOFOL 10 MG/ML IV BOLUS
INTRAVENOUS | Status: AC
  Filled 2018-07-26: qty 20

## 2018-07-26 MED ORDER — CEFAZOLIN SODIUM-DEXTROSE 2-4 GM/100ML-% IV SOLN
2.0000 g | INTRAVENOUS | Status: AC
  Administered 2018-07-26: 2 g via INTRAVENOUS
  Filled 2018-07-26: qty 100

## 2018-07-26 MED ORDER — FENTANYL CITRATE (PF) 250 MCG/5ML IJ SOLN
INTRAMUSCULAR | Status: AC
  Filled 2018-07-26: qty 5

## 2018-07-26 MED ORDER — PREGABALIN 50 MG PO CAPS: 50.0000 mg | ORAL_CAPSULE | Freq: Two times a day (BID) | ORAL | Status: DC

## 2018-07-26 MED ORDER — OXYCODONE HCL 5 MG/5ML PO SOLN: 5.0000 mg | Freq: Once | ORAL | Status: DC | PRN

## 2018-07-26 MED ORDER — 0.9 % SODIUM CHLORIDE (POUR BTL) OPTIME
TOPICAL | Status: DC | PRN
  Administered 2018-07-26: 2000 mL
  Administered 2018-07-26: 1000 mL

## 2018-07-26 MED ORDER — DESIPRAMINE HCL 25 MG PO TABS
25.0000 mg | ORAL_TABLET | Freq: Every day | ORAL | Status: DC
  Administered 2018-07-30 – 2018-08-01 (×2): 25 mg via ORAL
  Filled 2018-07-26 (×7): qty 1

## 2018-07-26 MED ORDER — LIDOCAINE 2% (20 MG/ML) 5 ML SYRINGE
INTRAMUSCULAR | Status: AC
  Filled 2018-07-26: qty 5

## 2018-07-26 MED ORDER — CEFAZOLIN SODIUM-DEXTROSE 2-4 GM/100ML-% IV SOLN
2.0000 g | Freq: Three times a day (TID) | INTRAVENOUS | Status: AC
  Administered 2018-07-26 – 2018-07-27 (×2): 2 g via INTRAVENOUS
  Filled 2018-07-26 (×2): qty 100

## 2018-07-26 MED ORDER — LORAZEPAM 1 MG PO TABS
1.0000 mg | ORAL_TABLET | Freq: Every day | ORAL | Status: DC
  Administered 2018-07-27 – 2018-08-01 (×6): 1 mg via ORAL
  Filled 2018-07-26 (×6): qty 1

## 2018-07-26 MED ORDER — SODIUM CHLORIDE 0.9% FLUSH
9.0000 mL | INTRAVENOUS | Status: DC | PRN
  Administered 2018-07-27: 9 mL via INTRAVENOUS
  Filled 2018-07-26: qty 9

## 2018-07-26 MED ORDER — CELECOXIB 200 MG PO CAPS
200.0000 mg | ORAL_CAPSULE | Freq: Two times a day (BID) | ORAL | Status: DC
  Administered 2018-07-27 – 2018-08-02 (×13): 200 mg via ORAL
  Filled 2018-07-26 (×15): qty 1

## 2018-07-26 MED ORDER — ONDANSETRON HCL 4 MG/2ML IJ SOLN
INTRAMUSCULAR | Status: AC
  Filled 2018-07-26: qty 2

## 2018-07-26 MED ORDER — ROCURONIUM BROMIDE 10 MG/ML (PF) SYRINGE
PREFILLED_SYRINGE | INTRAVENOUS | Status: AC
  Filled 2018-07-26: qty 10

## 2018-07-26 MED ORDER — ASPIRIN 81 MG PO CHEW
81.0000 mg | CHEWABLE_TABLET | Freq: Every day | ORAL | Status: DC
  Administered 2018-07-27 – 2018-08-02 (×7): 81 mg via ORAL
  Filled 2018-07-26 (×8): qty 1

## 2018-07-26 MED ORDER — PROPOFOL 10 MG/ML IV BOLUS
INTRAVENOUS | Status: DC | PRN
  Administered 2018-07-26: 60 mg via INTRAVENOUS
  Administered 2018-07-26: 100 mg via INTRAVENOUS
  Administered 2018-07-26: 50 mg via INTRAVENOUS
  Administered 2018-07-26: 80 mg via INTRAVENOUS
  Administered 2018-07-26: 60 mg via INTRAVENOUS

## 2018-07-26 MED ORDER — GLYCOPYRROLATE PF 0.2 MG/ML IJ SOSY
PREFILLED_SYRINGE | INTRAMUSCULAR | Status: DC | PRN
  Administered 2018-07-26: .1 mg via INTRAVENOUS

## 2018-07-26 MED ORDER — LACTATED RINGERS IV SOLN
INTRAVENOUS | Status: DC | PRN
  Administered 2018-07-26 (×2): via INTRAVENOUS

## 2018-07-26 MED ORDER — ALOSETRON HCL 0.5 MG PO TABS: 0.2500 mg | ORAL_TABLET | Freq: Every day | ORAL | Status: DC

## 2018-07-26 MED ORDER — ROCURONIUM BROMIDE 50 MG/5ML IV SOSY
PREFILLED_SYRINGE | INTRAVENOUS | Status: DC | PRN
  Administered 2018-07-26: 70 mg via INTRAVENOUS
  Administered 2018-07-26: 10 mg via INTRAVENOUS

## 2018-07-26 MED ORDER — LIDOCAINE 2% (20 MG/ML) 5 ML SYRINGE
INTRAMUSCULAR | Status: DC | PRN
  Administered 2018-07-26: 60 mg via INTRAVENOUS

## 2018-07-26 MED ORDER — FENTANYL 40 MCG/ML IV SOLN
INTRAVENOUS | Status: DC
  Administered 2018-07-26: 50 ug via INTRAVENOUS
  Administered 2018-07-26: 1000 ug via INTRAVENOUS
  Administered 2018-07-27: 20 ug via INTRAVENOUS
  Administered 2018-07-27: 30 ug via INTRAVENOUS
  Administered 2018-07-27: 10 ug via INTRAVENOUS
  Administered 2018-07-27 (×2): 20 ug via INTRAVENOUS
  Administered 2018-07-27: 40 ug via INTRAVENOUS
  Administered 2018-07-28: 10 ug via INTRAVENOUS
  Administered 2018-07-28: 20 ug via INTRAVENOUS
  Administered 2018-07-28: 30 ug via INTRAVENOUS
  Administered 2018-07-28: 40 ug via INTRAVENOUS
  Administered 2018-07-28: 30 ug via INTRAVENOUS
  Administered 2018-07-29: 0 ug via INTRAVENOUS
  Administered 2018-07-29: 40 ug via INTRAVENOUS
  Administered 2018-07-29: 0 ug via INTRAVENOUS
  Filled 2018-07-26: qty 25

## 2018-07-26 MED ORDER — PROMETHAZINE HCL 25 MG/ML IJ SOLN: 6.2500 mg | INTRAMUSCULAR | Status: DC | PRN

## 2018-07-26 MED ORDER — SUCCINYLCHOLINE CHLORIDE 200 MG/10ML IV SOSY
PREFILLED_SYRINGE | INTRAVENOUS | Status: AC
  Filled 2018-07-26: qty 10

## 2018-07-26 MED ORDER — BISACODYL 5 MG PO TBEC
10.0000 mg | DELAYED_RELEASE_TABLET | Freq: Every day | ORAL | Status: DC
  Administered 2018-07-27 – 2018-08-01 (×6): 10 mg via ORAL
  Filled 2018-07-26 (×7): qty 2

## 2018-07-26 MED ORDER — HYDROMORPHONE HCL 1 MG/ML IJ SOLN: 0.2500 mg | INTRAMUSCULAR | Status: DC | PRN

## 2018-07-26 MED ORDER — NALOXONE HCL 0.4 MG/ML IJ SOLN: 0.4000 mg | INTRAMUSCULAR | Status: DC | PRN

## 2018-07-26 MED ORDER — ENOXAPARIN SODIUM 40 MG/0.4ML ~~LOC~~ SOLN
40.0000 mg | SUBCUTANEOUS | Status: DC
  Administered 2018-07-26 – 2018-08-01 (×7): 40 mg via SUBCUTANEOUS
  Filled 2018-07-26 (×7): qty 0.4

## 2018-07-26 MED ORDER — PHENYLEPHRINE 40 MCG/ML (10ML) SYRINGE FOR IV PUSH (FOR BLOOD PRESSURE SUPPORT)
PREFILLED_SYRINGE | INTRAVENOUS | Status: AC
  Filled 2018-07-26: qty 10

## 2018-07-26 MED ORDER — SENNOSIDES-DOCUSATE SODIUM 8.6-50 MG PO TABS
1.0000 | ORAL_TABLET | Freq: Every day | ORAL | Status: DC
  Administered 2018-07-26 – 2018-07-31 (×4): 1 via ORAL
  Filled 2018-07-26 (×4): qty 1

## 2018-07-26 MED ORDER — INSULIN ASPART 100 UNIT/ML ~~LOC~~ SOLN
0.0000 [IU] | Freq: Four times a day (QID) | SUBCUTANEOUS | Status: DC
  Administered 2018-07-26: 2 [IU] via SUBCUTANEOUS

## 2018-07-26 MED ORDER — PANTOPRAZOLE SODIUM 40 MG IV SOLR: 40.0000 mg | INTRAVENOUS | Status: DC

## 2018-07-26 MED ORDER — EZETIMIBE 10 MG PO TABS
10.0000 mg | ORAL_TABLET | Freq: Every day | ORAL | Status: DC
  Administered 2018-07-27 – 2018-08-02 (×7): 10 mg via ORAL
  Filled 2018-07-26 (×8): qty 1

## 2018-07-26 MED ORDER — SUGAMMADEX SODIUM 200 MG/2ML IV SOLN
INTRAVENOUS | Status: DC | PRN
  Administered 2018-07-26: 200 mg via INTRAVENOUS

## 2018-07-26 MED ORDER — DEXAMETHASONE SODIUM PHOSPHATE 10 MG/ML IJ SOLN
INTRAMUSCULAR | Status: AC
  Filled 2018-07-26: qty 1

## 2018-07-26 MED ORDER — ACETAMINOPHEN 160 MG/5ML PO SOLN: 1000.0000 mg | Freq: Four times a day (QID) | ORAL | Status: AC

## 2018-07-26 MED ORDER — SODIUM CHLORIDE (PF) 0.9 % IJ SOLN
INTRAMUSCULAR | Status: DC | PRN
  Administered 2018-07-26: 49 mL

## 2018-07-26 MED ORDER — BUPIVACAINE HCL (PF) 0.5 % IJ SOLN
INTRAMUSCULAR | Status: AC
  Filled 2018-07-26: qty 30

## 2018-07-26 MED ORDER — ACETAMINOPHEN 500 MG PO TABS
1000.0000 mg | ORAL_TABLET | Freq: Four times a day (QID) | ORAL | Status: AC
  Administered 2018-07-26 – 2018-07-31 (×19): 1000 mg via ORAL
  Administered 2018-07-31: 500 mg via ORAL
  Filled 2018-07-26 (×20): qty 2

## 2018-07-26 MED ORDER — ONDANSETRON HCL 4 MG/2ML IJ SOLN
INTRAMUSCULAR | Status: DC | PRN
  Administered 2018-07-26: 4 mg via INTRAVENOUS

## 2018-07-26 MED ORDER — GLYCOPYRROLATE PF 0.2 MG/ML IJ SOSY
PREFILLED_SYRINGE | INTRAMUSCULAR | Status: AC
  Filled 2018-07-26: qty 1

## 2018-07-26 MED ORDER — BUPIVACAINE LIPOSOME 1.3 % IJ SUSP
INTRAMUSCULAR | Status: DC | PRN
  Administered 2018-07-26: 49 mL

## 2018-07-26 MED ORDER — DIPHENHYDRAMINE HCL 12.5 MG/5ML PO ELIX
12.5000 mg | ORAL_SOLUTION | Freq: Four times a day (QID) | ORAL | Status: DC | PRN
  Filled 2018-07-26: qty 5

## 2018-07-26 MED ORDER — LORATADINE 10 MG PO TABS
10.0000 mg | ORAL_TABLET | Freq: Every day | ORAL | Status: DC
  Administered 2018-07-27 – 2018-08-02 (×7): 10 mg via ORAL
  Filled 2018-07-26 (×7): qty 1

## 2018-07-26 MED ORDER — OXYCODONE HCL 5 MG PO TABS: 5.0000 mg | ORAL_TABLET | Freq: Once | ORAL | Status: DC | PRN

## 2018-07-26 MED ORDER — PANTOPRAZOLE SODIUM 40 MG IV SOLR
40.0000 mg | INTRAVENOUS | Status: DC
  Administered 2018-07-26 – 2018-07-28 (×3): 40 mg via INTRAVENOUS
  Filled 2018-07-26 (×4): qty 40

## 2018-07-26 MED ORDER — TRAMADOL HCL 50 MG PO TABS: 50.0000 mg | ORAL_TABLET | Freq: Four times a day (QID) | ORAL | Status: DC | PRN

## 2018-07-26 MED ORDER — HYDROCHLOROTHIAZIDE 25 MG PO TABS: 25.0000 mg | ORAL_TABLET | Freq: Every day | ORAL | Status: DC

## 2018-07-26 MED ORDER — DEXAMETHASONE SODIUM PHOSPHATE 10 MG/ML IJ SOLN
INTRAMUSCULAR | Status: DC | PRN
  Administered 2018-07-26: 10 mg via INTRAVENOUS

## 2018-07-26 MED ORDER — POTASSIUM CHLORIDE IN NACL 20-0.9 MEQ/L-% IV SOLN
INTRAVENOUS | Status: DC
  Administered 2018-07-26 – 2018-07-27 (×2): via INTRAVENOUS
  Filled 2018-07-26 (×3): qty 1000

## 2018-07-26 MED ORDER — FENTANYL CITRATE (PF) 100 MCG/2ML IJ SOLN
INTRAMUSCULAR | Status: DC | PRN
  Administered 2018-07-26: 50 ug via INTRAVENOUS
  Administered 2018-07-26: 100 ug via INTRAVENOUS
  Administered 2018-07-26 (×2): 50 ug via INTRAVENOUS
  Administered 2018-07-26: 100 ug via INTRAVENOUS

## 2018-07-26 MED ORDER — ONDANSETRON HCL 4 MG/2ML IJ SOLN: 4.0000 mg | Freq: Four times a day (QID) | INTRAMUSCULAR | Status: DC | PRN

## 2018-07-26 MED ORDER — DIPHENHYDRAMINE HCL 50 MG/ML IJ SOLN: 12.5000 mg | Freq: Four times a day (QID) | INTRAMUSCULAR | Status: DC | PRN

## 2018-07-26 MED ORDER — LACTATED RINGERS IV SOLN
INTRAVENOUS | Status: DC | PRN
  Administered 2018-07-26: 08:00:00 via INTRAVENOUS

## 2018-07-26 SURGICAL SUPPLY — 90 items
APPLICATOR COTTON TIP 6 STRL (MISCELLANEOUS) IMPLANT
APPLICATOR COTTON TIP 6IN STRL (MISCELLANEOUS)
APPLIER CLIP ROT 10 11.4 M/L (STAPLE)
CANISTER SUCT 3000ML PPV (MISCELLANEOUS) ×3 IMPLANT
CATH THORACIC 28FR (CATHETERS) ×1 IMPLANT
CATH THORACIC 28FR RT ANG (CATHETERS) IMPLANT
CATH THORACIC 36FR (CATHETERS) IMPLANT
CATH THORACIC 36FR RT ANG (CATHETERS) IMPLANT
CLIP APPLIE ROT 10 11.4 M/L (STAPLE) IMPLANT
CLIP VESOCCLUDE MED 6/CT (CLIP) IMPLANT
CONN ST 1/4X3/8  BEN (MISCELLANEOUS) ×1
CONN ST 1/4X3/8 BEN (MISCELLANEOUS) IMPLANT
CONN Y 3/8X3/8X3/8  BEN (MISCELLANEOUS) ×1
CONN Y 3/8X3/8X3/8 BEN (MISCELLANEOUS) ×2 IMPLANT
CONT SPEC 4OZ CLIKSEAL STRL BL (MISCELLANEOUS) ×19 IMPLANT
COVER SURGICAL LIGHT HANDLE (MISCELLANEOUS) ×3 IMPLANT
COVER WAND RF STERILE (DRAPES) ×3 IMPLANT
CUTTER ECHEON FLEX ENDO 45 340 (ENDOMECHANICALS) ×1 IMPLANT
DERMABOND ADVANCED (GAUZE/BANDAGES/DRESSINGS) ×1
DERMABOND ADVANCED .7 DNX12 (GAUZE/BANDAGES/DRESSINGS) IMPLANT
DRAIN CHANNEL 28F RND 3/8 FF (WOUND CARE) IMPLANT
DRAIN CHANNEL 32F RND 10.7 FF (WOUND CARE) IMPLANT
DRAPE LAPAROSCOPIC ABDOMINAL (DRAPES) ×3 IMPLANT
DRAPE SLUSH/WARMER DISC (DRAPES) ×3 IMPLANT
ELECT REM PT RETURN 9FT ADLT (ELECTROSURGICAL) ×3
ELECTRODE REM PT RTRN 9FT ADLT (ELECTROSURGICAL) ×2 IMPLANT
GAUZE SPONGE 4X4 12PLY STRL (GAUZE/BANDAGES/DRESSINGS) ×3 IMPLANT
GAUZE SPONGE 4X4 12PLY STRL LF (GAUZE/BANDAGES/DRESSINGS) ×1 IMPLANT
GLOVE BIOGEL PI IND STRL 7.5 (GLOVE) IMPLANT
GLOVE BIOGEL PI INDICATOR 7.5 (GLOVE) ×1
GLOVE SURG SIGNA 7.5 PF LTX (GLOVE) ×6 IMPLANT
GOWN STRL REUS W/ TWL LRG LVL3 (GOWN DISPOSABLE) ×4 IMPLANT
GOWN STRL REUS W/ TWL XL LVL3 (GOWN DISPOSABLE) ×2 IMPLANT
GOWN STRL REUS W/TWL LRG LVL3 (GOWN DISPOSABLE) ×2
GOWN STRL REUS W/TWL XL LVL3 (GOWN DISPOSABLE) ×1
HEMOSTAT SURGICEL 2X14 (HEMOSTASIS) IMPLANT
IV CATH 22GX1 FEP (IV SOLUTION) IMPLANT
KIT BASIN OR (CUSTOM PROCEDURE TRAY) ×3 IMPLANT
KIT SUCTION CATH 14FR (SUCTIONS) ×3 IMPLANT
KIT TURNOVER KIT B (KITS) ×3 IMPLANT
NDL SPNL 18GX3.5 QUINCKE PK (NEEDLE) ×2 IMPLANT
NEEDLE SPNL 18GX3.5 QUINCKE PK (NEEDLE) ×3 IMPLANT
NS IRRIG 1000ML POUR BTL (IV SOLUTION) ×7 IMPLANT
PACK CHEST (CUSTOM PROCEDURE TRAY) ×3 IMPLANT
PAD ARMBOARD 7.5X6 YLW CONV (MISCELLANEOUS) ×6 IMPLANT
POUCH ENDO CATCH II 15MM (MISCELLANEOUS) ×1 IMPLANT
POUCH SPECIMEN RETRIEVAL 10MM (ENDOMECHANICALS) ×1 IMPLANT
RELOAD STAPLE 35X2.5 WHT THIN (STAPLE) IMPLANT
RELOAD STAPLE 45 4.1 GRN THCK (STAPLE) IMPLANT
RELOAD STAPLE 45 GOLD REG/THCK (STAPLE) IMPLANT
SEALANT PROGEL (MISCELLANEOUS) IMPLANT
SEALANT SURG COSEAL 4ML (VASCULAR PRODUCTS) IMPLANT
SEALANT SURG COSEAL 8ML (VASCULAR PRODUCTS) IMPLANT
SHEARS HARMONIC HDI 20CM (ELECTROSURGICAL) ×1 IMPLANT
SOLUTION ANTI FOG 6CC (MISCELLANEOUS) ×3 IMPLANT
SPECIMEN JAR MEDIUM (MISCELLANEOUS) ×3 IMPLANT
SPONGE INTESTINAL PEANUT (DISPOSABLE) IMPLANT
SPONGE TONSIL TAPE 1 RFD (DISPOSABLE) ×3 IMPLANT
STAPLE RELOAD 2.5MM WHITE (STAPLE) ×9 IMPLANT
STAPLE RELOAD 45 GRN (STAPLE) ×4 IMPLANT
STAPLE RELOAD 45MM GOLD (STAPLE) ×30 IMPLANT
STAPLE RELOAD 45MM GREEN (STAPLE) ×2
STAPLER VASCULAR ECHELON 35 (CUTTER) ×1 IMPLANT
SUT PROLENE 4 0 RB 1 (SUTURE)
SUT PROLENE 4-0 RB1 .5 CRCL 36 (SUTURE) IMPLANT
SUT SILK  1 MH (SUTURE) ×2
SUT SILK 1 MH (SUTURE) ×4 IMPLANT
SUT SILK 2 0 SH CR/8 (SUTURE) ×1 IMPLANT
SUT SILK 2 0SH CR/8 30 (SUTURE) IMPLANT
SUT SILK 3 0SH CR/8 30 (SUTURE) IMPLANT
SUT VIC AB 1 CTX 36 (SUTURE) ×1
SUT VIC AB 1 CTX36XBRD ANBCTR (SUTURE) IMPLANT
SUT VIC AB 2-0 CTX 36 (SUTURE) ×1 IMPLANT
SUT VIC AB 2-0 UR6 27 (SUTURE) IMPLANT
SUT VIC AB 3-0 MH 27 (SUTURE) IMPLANT
SUT VIC AB 3-0 X1 27 (SUTURE) ×3 IMPLANT
SUT VICRYL 2 TP 1 (SUTURE) IMPLANT
SYR 20CC LL (SYRINGE) IMPLANT
SYR 30ML LL (SYRINGE) ×1 IMPLANT
SYSTEM SAHARA CHEST DRAIN ATS (WOUND CARE) ×3 IMPLANT
TAPE CLOTH 4X10 WHT NS (GAUZE/BANDAGES/DRESSINGS) ×3 IMPLANT
TAPE CLOTH SURG 4X10 WHT LF (GAUZE/BANDAGES/DRESSINGS) ×1 IMPLANT
TAPE STRIPS DRAPE STRL (GAUZE/BANDAGES/DRESSINGS) ×1 IMPLANT
TIP APPLICATOR SPRAY EXTEND 16 (VASCULAR PRODUCTS) IMPLANT
TOWEL GREEN STERILE (TOWEL DISPOSABLE) ×3 IMPLANT
TOWEL GREEN STERILE FF (TOWEL DISPOSABLE) ×3 IMPLANT
TRAY FOLEY MTR SLVR 16FR STAT (SET/KITS/TRAYS/PACK) ×3 IMPLANT
TROCAR XCEL BLADELESS 5X75MML (TROCAR) ×3 IMPLANT
TROCAR XCEL NON-BLD 5MMX100MML (ENDOMECHANICALS) IMPLANT
WATER STERILE IRR 1000ML POUR (IV SOLUTION) ×6 IMPLANT

## 2018-07-26 NOTE — Anesthesia Postprocedure Evaluation (Signed)
Anesthesia Post Note  Patient: Vanessa Day  Procedure(s) Performed: VIDEO ASSISTED THORACOSCOPY (Right Chest) SEGMENTECTOMY (Right ) RIGHT LOWER LOBE LOBECTOMY (Right Chest)     Patient location during evaluation: PACU Anesthesia Type: General Level of consciousness: awake and alert Pain management: pain level controlled Vital Signs Assessment: post-procedure vital signs reviewed and stable Respiratory status: spontaneous breathing, nonlabored ventilation and respiratory function stable Cardiovascular status: blood pressure returned to baseline and stable Postop Assessment: no apparent nausea or vomiting Anesthetic complications: no    Last Vitals:  Vitals:   07/26/18 1245 07/26/18 1315  BP:    Pulse: 89 63  Resp: 20 (!) 6  Temp:    SpO2: 100% 100%    Last Pain:  Vitals:   07/26/18 1240  TempSrc:   PainSc: Greasy

## 2018-07-26 NOTE — Anesthesia Procedure Notes (Signed)
Central Venous Catheter Insertion Performed by: Annye Asa, MD, anesthesiologist Start/End7/02/2018 7:10 AM, 07/26/2018 7:22 AM Patient location: Pre-op. Preanesthetic checklist: patient identified, IV checked, risks and benefits discussed, surgical consent, monitors and equipment checked, pre-op evaluation, timeout performed and anesthesia consent Position: supine Lidocaine 1% used for infiltration and patient sedated Hand hygiene performed , maximum sterile barriers used  and Seldinger technique used Catheter size: 8 Fr Central line was placed.Double lumen Procedure performed using ultrasound guided technique. Ultrasound Notes:anatomy identified, needle tip was noted to be adjacent to the nerve/plexus identified, no ultrasound evidence of intravascular and/or intraneural injection and image(s) printed for medical record Attempts: 1 Following insertion, line sutured, dressing applied and Biopatch. Post procedure assessment: blood return through all ports, free fluid flow and no air  Patient tolerated the procedure well with no immediate complications. Additional procedure comments: CVP: Timeout, sterile prep, drape, FBP R neck.  Trendelenburg position.  1% lido local, finder and trocar RIJ 1st pass with US guidance.  2 lumen placed over J wire. Biopatch and sterile dressing on.  Patient tolerated well.  VSS.  Jenita Seashore, MD.

## 2018-07-26 NOTE — Anesthesia Procedure Notes (Addendum)
Procedure Name: Intubation Date/Time: 07/26/2018 8:40 AM Performed by: Orlie Dakin, CRNA Pre-anesthesia Checklist: Patient identified, Emergency Drugs available, Suction available and Patient being monitored Patient Re-evaluated:Patient Re-evaluated prior to induction Oxygen Delivery Method: Circle system utilized Preoxygenation: Pre-oxygenation with 100% oxygen Induction Type: IV induction Ventilation: Mask ventilation without difficulty and Oral airway inserted - appropriate to patient size Laryngoscope Size: Glidescope and 4 Grade View: Grade II Tube type: Oral Endobronchial tube: EBT position confirmed by fiberoptic bronchoscope and Bronchial Blocker placed under direct vision Tube size: 8.0 mm Number of attempts: 5 or more Airway Equipment and Method: Fiberoptic brochoscope and Bougie stylet Placement Confirmation: ETT inserted through vocal cords under direct vision,  positive ETCO2 and breath sounds checked- equal and bilateral Secured at: 23 cm Tube secured with: Tape Dental Injury: Bloody posterior oropharynx  Difficulty Due To: Difficulty was unanticipated, Difficult Airway- due to anterior larynx, Difficult Airway- due to limited oral opening and Difficult Airway- due to reduced neck mobility Comments: Difficult placement double-lumen EBT.  Small mouth, large incisors and anterior anatomy. 1st DL Tehila Sokolow, CRNA, Miller 3 unable to pass EBT due to small mouth opening.  2nd DL  Dr Sabra Heck, MAC 3, immediately noted esophageal intubation, removed.  Then used Glidescope 4, bronchial cuff of EBT at glottic opening and unable to pass.  Dr Annell Greening MD, DL and bougie used, unable to advance EBT over bougie, resistance at glottic opening.  Dr Ola Spurr DL and 8.0 ETT placed, bronchial blocker used.  Noted small amount blood posterior oral pharynx with DLs.

## 2018-07-26 NOTE — Op Note (Signed)
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  POSTOPERATIVE DIAGNOSIS:  Adenocarcinoma right lower lobe, clinical stage IA (T1, N0).  PROCEDURE:   Right video-assisted thoracoscopy, Wedge resection right lower lobe nodule, Thoracoscopic right lower lobectomy, Lymph node dissection, Intercostal nerve blocks  levels 3-9.  SURGEON:  Modesto Charon, MD  ASSISTANT:  Lars Pinks, PA-C.  ANESTHESIA:  General.  FINDINGS:  Mass in the anterior portion of right lower lobe along the minor fissure.  Frozen section revealed adenocarcinoma.  Bronchial margin negative for tumor.  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  She had a CT of the abdomen about a year ago, which showed an opacity in the right lower lobe.  Recently a followup CT showed an  increase in size of the right lower lobe lung nodule.  On PET CT the nodule was hypermetabolic with an SUV of 3.1.  There was no evidence of regional or distant metastases.  This was suspected to be a nonsmall cell carcinoma, probably a low grade  adenocarcinoma.  It was clinical stage IA (T1, N0).  She was offered the option of biopsy versus surgical resection for definitive diagnosis and treatment.  She opted to proceed directly to surgery.  The indications, risks, benefits, and alternatives  were discussed in detail with the patient.  She understood and accepted the risks and agreed to proceed.  OPERATIVE NOTE:  The patient was brought to the preoperative holding area on 07/26/2018.  The anesthesia service placed a central venous catheter and an arterial blood pressure monitoring  line.  She was taken to the operating room, anesthetized and  intubated with a single lumen endotracheal tube.  This was a difficult intubation and Anesthesia was unable to place a double lumen tube.  A bronchial blocker was placed, positioned and inflated.  Intravenous antibiotics were administered.  A Foley catheter  was placed.  Sequential compression devices were placed on the calves for DVT prophylaxis.  She was placed in a left lateral decubitus position and the right chest was prepped and draped in the usual sterile fashion.  A timeout was performed.  A solution containing 20 mL of liposomal bupivacaine, 30 mL of 0.5% bupivacaine and 50 mL of saline was prepared.  This was used for local anesthesia at the incision sites as well as for the intercostal nerve blocks.  An  incision was made in the seventh interspace in the mid axillary line. A 5 mm port was inserted.  The thoracoscope was advanced into the chest.  There was good isolation of the right lung with the blocker in place.  A 5 cm working incision was made in the  5th interspace anterolaterally.  No rib spreading was performed during the procedure.  There was no abnormality of the parietal pleura and no pleural effusion.  The inferior pulmonary ligament was divided using a Harmonic scalpel.  A level 9 lymph node  was identified.  It was removed and sent for permanent pathology.  All nodes that were removed were sent as separate specimens for permanent pathology.  All the nodes appeared grossly normal, although some were relatively enlarged.  The pleural reflection was  divided at the hilum posteriorly.  Level  7 nodes were removed.  The pleural reflection was divided at the hilum anteriorly.  Inspection of the fissure revealed it was nearly complete anteriorly. Posteriorly between the upper lobe and the superior segment,  it was incomplete.  The mass was visible on the visceral pleural surface of the lower lobe opposite the middle lobe.  This  had a relatively infiltrative appearance and appeared to be malignant.  It was also over a relatively wide area.  A wedge  resection was performed to confirm the diagnosis.  It was felt that this would best be treated with a lobectomy if it was cancerous.  The wedge resection was performed with sequential firings of an Echelon 45 mm stapler.  Gold and green cartridges were  used depending on tissue thickness.  The specimen was placed into an endoscopic retrieval bag, removed and sent for frozen section.  While awaiting the results of the frozen section, intercostal nerve blocks were performed from the 3rd to the 9th  interspace.  A needle was passed via a posterior approach and 10 ml of the bupivacaine solution was injected into a subpleural plane at each level from the 3rd interspace to the 9th.  The frozen section returned showing adenocarcinoma.  The decision was made to proceed with  lobectomy for definitive treatment.  The inferior pulmonary vein was essentially completely dissected out by this point.  It was divided with the endoscopic vascular stapler.  The vascular stapler was used to divide the superior segmental pulmonary  arterial branch and then the basilar trunk of the pulmonary artery was divided with the endoscopic vascular stapler as well.  The fissure then was completed between the upper lobe and the superior segment with sequential firings of the Echelon stapler  using gold cartridges.  At about this point, there was cross ventilation in the right lung with inflation of the lung.  The bronchial blocker was repositioned and the right lung was deflated enough to allow completion of the procedure.  The Echelon  stapler with a green cartridge was placed across the right lower lobe bronchus at its origin, taking care not to kink or narrow the middle lobe bronchus.  A test inflation showed good aeration of the upper and middle lobes.  The stapler was fired,  transecting the bronchus.  The lower  lobe was placed into an endoscopic retrieval bag, removed and sent for frozen section of the bronchial margin, which subsequently returned with no tumor seen.  The pleura overlying the azygos vein was incised and a level 4R node was removed.  A small 2R node was removed as well along with a large piece of fatty tissue.  These were sent for permanent pathology.  The chest was copiously irrigated with warm saline.  A test inflation at 30 cm of water revealed no air leakage  from the bronchial stump.  A 28-French chest tube was placed through the original port incision and secured with a #1 silk suture.  Dual-lung ventilation was resumed.  The working incision was closed in standard fashion.  Dermabond was applied.  The  chest tube was placed to suction.  The patient was placed back in supine position.  She was extubated in the operating room and taken to the [facility] in good condition.  TN/NUANCE  D:07/26/2018 T:07/26/2018 JOB:[id number]

## 2018-07-26 NOTE — Transfer of Care (Signed)
Immediate Anesthesia Transfer of Care Note  Patient: [name]  Procedure(s) Performed: VIDEO ASSISTED THORACOSCOPY (Right Chest) SEGMENTECTOMY (Right ) RIGHT LOWER LOBE LOBECTOMY (Right Chest)  Patient Location: PACU  Anesthesia Type:General  Level of Consciousness: drowsy and patient cooperative  Airway & Oxygen Therapy: Patient Spontanous Breathing and Patient connected to face mask oxygen  Post-op Assessment: Report given to RN and Post -op Vital signs reviewed and stable  Post vital signs: Reviewed and stable  Last Vitals:  Vitals Value Taken Time  BP    Temp    Pulse 89 07/26/18 1137  Resp 16 07/26/18 1137  SpO2 100 % 07/26/18 1137  Vitals shown include unvalidated device data.  Last Pain:  Vitals:   07/26/18 0609  TempSrc:   PainSc: 0-No pain      Patients Stated Pain Goal: 1 (46/56/81 2751)  Complications: No apparent anesthesia complications

## 2018-07-26 NOTE — Brief Op Note (Addendum)
07/26/2018  11:13 AM  PATIENT:  Vanessa Day  78 y.o. female  PRE-OPERATIVE DIAGNOSIS:  RLL NODULE, Suspected non-small cell carcinoma, Clinical stage IA (t1,N0)  POST-OPERATIVE DIAGNOSIS:  ADENOCARCINOMA RIGHT LOWER LOBE-CLINICAL STAGE IA (T1,N0)  PROCEDURE:  RIGHT VIDEO ASSISTED THORACOSCOPY,  WEDGE RESECTION RIGHT LOWER LOBE NODULE, THORACOSCOPIC RIGHT LOWER LOBECTOMY, LYMPH NODE DISSECTION INTERCOSTAL NERVE BLOCK  SURGEON:  Surgeon(s) and Role:    Melrose Nakayama, MD - Primary  PHYSICIAN ASSISTANT: Lars Pinks PA-C  ANESTHESIA:   general  EBL:  Minimal. Please see anesthesia record.   BLOOD ADMINISTERED:none  DRAINS: 61 French chest tube placed in the right pleural space   LOCAL MEDICATIONS USED:  BUPIVICAINE   SPECIMEN:  Source of Specimen:  Wedge RLL, followed by RLL, multiple lymph nodes  DISPOSITION OF SPECIMEN:  Pathology. Frozen section of wedge RLL showed adenocarcinoma  COUNTS CORRECT:  YES  DICTATION: .Dragon Dictation  PLAN OF CARE: Admit to inpatient   PATIENT DISPOSITION:  PACU - hemodynamically stable.   Delay start of Pharmacological VTE agent (>24hrs) due to surgical blood loss or risk of bleeding: no

## 2018-07-26 NOTE — Anesthesia Preprocedure Evaluation (Signed)
Anesthesia Evaluation  Patient identified by MRN, date of birth, ID band Patient awake    Reviewed: Allergy & Precautions, NPO status , Patient's Chart, lab work & pertinent test results  History of Anesthesia Complications (+) PONV  Airway Mallampati: I  TM Distance: >3 FB Neck ROM: Full    Dental  (+) Teeth Intact, Dental Advisory Given   Pulmonary former smoker,    breath sounds clear to auscultation       Cardiovascular hypertension, Pt. on medications  Rhythm:Regular Rate:Normal     Neuro/Psych PSYCHIATRIC DISORDERS Anxiety    GI/Hepatic GERD  Medicated,  Endo/Other    Renal/GU      Musculoskeletal  (+) Arthritis ,   Abdominal   Peds  Hematology   Anesthesia Other Findings Pulmonary mass  Reproductive/Obstetrics                             Anesthesia Physical  Anesthesia Plan  ASA: II  Anesthesia Plan: General   Post-op Pain Management:    Induction: Intravenous  PONV Risk Score and Plan: 4 or greater and Ondansetron, Dexamethasone, Midazolam, Scopolamine patch - Pre-op and Treatment may vary due to age or medical condition  Airway Management Planned: Double Lumen EBT  Additional Equipment: Arterial line  Intra-op Plan:   Post-operative Plan: Extubation in OR  Informed Consent: I have reviewed the patients History and Physical, chart, labs and discussed the procedure including the risks, benefits and alternatives for the proposed anesthesia with the patient or authorized representative who has indicated his/her understanding and acceptance.     Dental advisory given  Plan Discussed with: CRNA, Anesthesiologist and Surgeon  Anesthesia Plan Comments:         Anesthesia Quick Evaluation

## 2018-07-26 NOTE — Anesthesia Procedure Notes (Signed)
Arterial Line Insertion Start/End7/02/2018 7:00 AM, 07/26/2018 7:10 AM Performed by: Josephine Igo, CRNA, CRNA  Patient location: Pre-op. Preanesthetic checklist: patient identified, IV checked, risks and benefits discussed, surgical consent, pre-op evaluation and timeout performed Lidocaine 1% used for infiltration and patient sedated Left, radial was placed Catheter size: 20 G Hand hygiene performed  and maximum sterile barriers used   Attempts: 2 Procedure performed without using ultrasound guided technique. Following insertion, dressing applied and Biopatch. Post procedure assessment: normal  Patient tolerated the procedure well with no immediate complications.

## 2018-07-26 NOTE — Interval H&P Note (Signed)
History and Physical Interval Note:  07/26/2018 7:58 AM  Vanessa Day  has presented today for surgery, with the diagnosis of RLL NODULE.  The various methods of treatment have been discussed with the patient and family. After consideration of risks, benefits and other options for treatment, the patient has consented to  Procedure(s): VIDEO ASSISTED THORACOSCOPY (Right) SEGMENTECTOMY (Right) POSSIBLE LOBECTOMY (Right) as a surgical intervention.  The patient's history has been reviewed, patient examined, no change in status, stable for surgery.  I have reviewed the patient's chart and labs.  Questions were answered to the patient's satisfaction.     Melrose Nakayama

## 2018-07-26 NOTE — Addendum Note (Signed)
Addendum  created 07/26/18 1633 by Orlie Dakin, CRNA   Clinical Note Signed, Intraprocedure Blocks edited

## 2018-07-26 NOTE — Addendum Note (Signed)
Addendum  created 07/26/18 1716 by Orlie Dakin, CRNA   Clinical Note Signed, Intraprocedure Blocks edited

## 2018-07-26 NOTE — Progress Notes (Signed)
     [address]       [place], [address]             [phone]      No events.  Doing well.  Denies any pain No air leak  Vitals:   07/26/18 1700 07/26/18 1800  BP: 132/84 140/83  Pulse: 80 83  Resp: 14 (!) 22  Temp:    SpO2: 96% 95%    Intake/Output Summary (Last 24 hours) at 07/26/2018 1920 Last data filed at 07/26/2018 1800 Gross per 24 hour  Intake 2025 ml  Output 545 ml  Net 1480 ml   CBC    Component Value Date/Time   WBC 8.4 07/23/2018 0950   RBC 4.18 07/23/2018 0950   HGB 12.9 07/23/2018 0950   HGB 14.5 04/30/2018 1401   HCT 38.3 07/23/2018 0950   PLT 283 07/23/2018 0950   PLT 337 04/30/2018 1401   MCV 91.6 07/23/2018 0950   MCH 30.9 07/23/2018 0950   MCHC 33.7 07/23/2018 0950   RDW 12.9 07/23/2018 0950   LYMPHSABS 1.9 04/30/2018 1401   MONOABS 0.7 04/30/2018 1401   EOSABS 0.1 04/30/2018 1401   BASOSABS 0.0 04/30/2018 1401   BMP Latest Ref Rng & Units 07/23/2018 04/30/2018 08/19/2015  Glucose 70 - 99 mg/dL 117(H) 123(H) 101(H)  BUN 8 - 23 mg/dL 28(H) 21 14  Creatinine 0.44 - 1.00 mg/dL 1.15(H) 1.26(H) 0.94  Sodium 135 - 145 mmol/L 136 143 140  Potassium 3.5 - 5.1 mmol/L 3.9 4.0 3.9  Chloride 98 - 111 mmol/L 105 104 101  CO2 22 - 32 mmol/L 17(L) 28 31  Calcium 8.9 - 10.3 mg/dL 9.1 10.5(H) 9.4

## 2018-07-27 ENCOUNTER — Other Ambulatory Visit: Payer: Self-pay

## 2018-07-27 ENCOUNTER — Inpatient Hospital Stay (HOSPITAL_COMMUNITY): Payer: Medicare Other

## 2018-07-27 ENCOUNTER — Encounter (HOSPITAL_COMMUNITY): Payer: Self-pay | Admitting: Thoracic Surgery (Cardiothoracic Vascular Surgery)

## 2018-07-27 LAB — POCT I-STAT 7, (LYTES, BLD GAS, ICA,H+H)
Acid-base deficit: 2 mmol/L (ref 0.0–2.0)
Bicarbonate: 22.8 mmol/L (ref 20.0–28.0)
Calcium, Ion: 1.24 mmol/L (ref 1.15–1.40)
HCT: 31 % — ABNORMAL LOW (ref 36.0–46.0)
Hemoglobin: 10.5 g/dL — ABNORMAL LOW (ref 12.0–15.0)
O2 Saturation: 95 %
Potassium: 4.3 mmol/L (ref 3.5–5.1)
Sodium: 136 mmol/L (ref 135–145)
TCO2: 24 mmol/L (ref 22–32)
pCO2 arterial: 37.9 mmHg (ref 32.0–48.0)
pH, Arterial: 7.386 (ref 7.350–7.450)
pO2, Arterial: 74 mmHg — ABNORMAL LOW (ref 83.0–108.0)

## 2018-07-27 LAB — CBC
HCT: 32.1 % — ABNORMAL LOW (ref 36.0–46.0)
Hemoglobin: 10.7 g/dL — ABNORMAL LOW (ref 12.0–15.0)
MCH: 31.6 pg (ref 26.0–34.0)
MCHC: 33.3 g/dL (ref 30.0–36.0)
MCV: 94.7 fL (ref 80.0–100.0)
Platelets: 246 10*3/uL (ref 150–400)
RBC: 3.39 MIL/uL — ABNORMAL LOW (ref 3.87–5.11)
RDW: 12.8 % (ref 11.5–15.5)
WBC: 15.7 10*3/uL — ABNORMAL HIGH (ref 4.0–10.5)
nRBC: 0 % (ref 0.0–0.2)

## 2018-07-27 LAB — BASIC METABOLIC PANEL
Anion gap: 6 (ref 5–15)
BUN: 26 mg/dL — ABNORMAL HIGH (ref 8–23)
CO2: 25 mmol/L (ref 22–32)
Calcium: 8.1 mg/dL — ABNORMAL LOW (ref 8.9–10.3)
Chloride: 109 mmol/L (ref 98–111)
Creatinine, Ser: 1.08 mg/dL — ABNORMAL HIGH (ref 0.44–1.00)
GFR calc Af Amer: 57 mL/min — ABNORMAL LOW (ref >60)
GFR calc non Af Amer: 49 mL/min — ABNORMAL LOW (ref >60)
Glucose, Bld: 113 mg/dL — ABNORMAL HIGH (ref 70–99)
Potassium: 4.5 mmol/L (ref 3.5–5.1)
Sodium: 140 mmol/L (ref 135–145)

## 2018-07-27 LAB — GLUCOSE, CAPILLARY
Glucose-Capillary: 120 mg/dL — ABNORMAL HIGH (ref 70–99)
Glucose-Capillary: 79 mg/dL (ref 70–99)
Glucose-Capillary: 95 mg/dL (ref 70–99)

## 2018-07-27 MED ORDER — TRAMADOL HCL 50 MG PO TABS
50.0000 mg | ORAL_TABLET | Freq: Four times a day (QID) | ORAL | Status: DC | PRN
  Administered 2018-07-29 – 2018-07-30 (×3): 50 mg via ORAL
  Filled 2018-07-27 (×4): qty 1

## 2018-07-27 MED ORDER — OXYCODONE HCL 5 MG PO TABS
5.0000 mg | ORAL_TABLET | ORAL | Status: DC | PRN
  Administered 2018-07-29 – 2018-07-31 (×3): 5 mg via ORAL
  Filled 2018-07-27 (×4): qty 1

## 2018-07-27 MED ORDER — MENTHOL 3 MG MT LOZG
1.0000 | LOZENGE | OROMUCOSAL | Status: DC | PRN
  Administered 2018-07-27: 3 mg via ORAL
  Filled 2018-07-27: qty 9

## 2018-07-27 MED ORDER — HYDROCHLOROTHIAZIDE 25 MG PO TABS
25.0000 mg | ORAL_TABLET | Freq: Every day | ORAL | Status: DC
  Administered 2018-07-28 – 2018-08-02 (×6): 25 mg via ORAL
  Filled 2018-07-27 (×6): qty 1

## 2018-07-27 MED ORDER — PREGABALIN 25 MG PO CAPS
50.0000 mg | ORAL_CAPSULE | Freq: Two times a day (BID) | ORAL | Status: DC
  Administered 2018-07-27 – 2018-07-28 (×4): 50 mg via ORAL
  Filled 2018-07-27 (×2): qty 1
  Filled 2018-07-27: qty 2
  Filled 2018-07-27 (×2): qty 1

## 2018-07-27 MED ORDER — PHENOL 1.4 % MT LIQD
1.0000 | OROMUCOSAL | Status: DC | PRN
  Administered 2018-07-30: 1 via OROMUCOSAL
  Filled 2018-07-27: qty 177

## 2018-07-27 NOTE — Discharge Instructions (Signed)
Video-Assisted Thoracic Surgery, Care After This sheet gives you information about how to care for yourself after your procedure. Your doctor may also give you more specific instructions. If you have problems or questions, contact your doctor. What can I expect after the procedure? After the procedure, it is common to have:  Some pain and soreness in your chest.  Pain when you breathe in (inhale) and cough.  Trouble pooping (constipation).  Tiredness (fatigue).  Trouble sleeping. Follow these instructions at home: Preventing lung infection (pneumonia)  Take deep breaths or do breathing exercises as told by your doctor.  Cough often. Coughing is important to clear thick spit (phlegm) and open your lungs.  You can make coughing hurt less if you try supporting (splinting) your chest. Try one of these when you cough: ? Hold a pillow against your chest. ? Place both hands flat on top of your cut.  Use an incentive spirometer as told by your doctor. This is a tool that measures how well you can fill your lungs with each breath.  Do lung therapy (pulmonary rehabilitation) as told. Medicines  Take over-the-counter or prescription medicines only as told by your doctor.  If you have pain, take pain-relieving medicine before your pain gets very bad. Doing this will help you breathe and cough more comfortably.  If you were prescribed an antibiotic medicine, take it as told by your doctor. Do not stop taking the antibiotic even if you start to feel better. Activity  Ask your doctor what activities are safe for you.  Avoid activities that use your chest muscles for 3-4 weeks or longer.  Do not lift anything that is heavier than 10 lb (4.5 kg), or the limit that your doctor tells you, until he or she says that it is safe. Cut (incision) care  Follow instructions from your doctor about how to take care of your cut(s) from surgery. Make sure you: ? Wash your hands with soap and water  before you change your bandage (dressing). If you cannot use soap and water, use hand sanitizer. ? Change your bandage as told by your doctor. ? Leave stitches (sutures), skin glue, or skin tape (adhesive) strips in place. They may need to stay in place for 2 weeks or longer. If tape strips get loose and curl up, you may trim the loose edges. Do not remove tape strips completely unless your doctor says it is okay.  Keep your bandage dry until it has been removed.  Every day, check the area around your cut(s) for signs of infection. Check for: ? Redness, swelling, or pain. ? Fluid or blood. ? Warmth. ? Pus or a bad smell. Bathing  Do not take baths, swim, or use a hot tub until your doctor approves. You may take showers.  After your bandage is removed, use soap and water to gently wash the your cut(s) from surgery. Do not use anything else to clean your cut(s) unless your doctor tells you to do this. Driving   Do not drive until your doctor approves.  Do not drive or use heavy machinery while taking prescription pain medicine. Eating and drinking  Eat a healthy diet as told by your doctor. A healthy diet includes: ? Lots of fresh fruits and vegetables. ? Whole grains. ? Low-fat (lean) proteins.  Limit foods that are high in fat and processed sugars. These include fried and sweet foods.  Drink enough fluid to keep your pee (urine) clear or light yellow. General instructions     To prevent or treat trouble pooping while you are taking prescription pain medicine, your doctor may recommend that you: ? Take over-the-counter or prescription medicines. ? Eat foods that have a lot of fiber. These include beans, fresh fruits and vegetables, and whole grains.  Do not use any products that contain nicotine or tobacco. These include cigarettes and e-cigarettes. If you need help quitting, ask your doctor.  Avoid being where people are smoking (avoid secondhand smoke).  Wear compression  stockings as told by your doctor. These stockings help you: ? Not get blood clots in your legs. ? Have less swelling in your legs.  If you have a chest tube, care for it as told by your doctor.  Do not travel by airplane during the 2 weeks after your chest tube is removed, or until your doctor says that this is safe.  Keep all follow-up visits as told by your doctor. This is important. Contact a doctor if:  You have redness, swelling, or pain around a cut from surgery.  You have fluid or blood coming from a cut from surgery.  Your cut(s) from surgery feel warm to the touch.  You have pus or a bad smell coming from a cut from surgery.  You have a fever or chills.  You feel sick to your stomach (nauseous).  You throw up (vomit).  You have pain that does not get better with medicine. Get help right away if:  You have chest pain.  Your heart is fluttering or beating fast.  You start to have a rash.  You have shortness of breath.  You have trouble breathing.  You are confused.  You have trouble talking or understanding.  You feel weak, light-headed, or dizzy.  You faint. Summary  To help prevent lung infection (pneumonia), take deep breaths or do breathing exercises as told by your doctor.  Cough often. This is important for clearing chest fluid (phlegm). You can make coughing hurt less if you hold a pillow to your chest or put your hands flat on the cut(s) when you cough (do splinting).  Do not drive until your doctor approves.  Every day, check your cut(s) for signs of infection. These signs can be redness, swelling, pain, fluid, blood, warmth, pus, or a bad smell.  Eat a healthy diet. This includes lots of fresh fruits and vegetables, whole grains, and low-fat (lean) proteins. This information is not intended to replace advice given to you by your health care provider. Make sure you discuss any questions you have with your health care provider. Document  Released: 05/07/2012 Document Revised: 12/23/2016 Document Reviewed: 12/21/2015 [employer]vier Patient Education  2020 [employer]   

## 2018-07-27 NOTE — Discharge Summary (Addendum)
Physician Discharge Summary       [address]       ,[place] [address]             [phone]    Patient ID: Vanessa Day MRN: [id number] DOB/AGE: 79/09/1940 79 y.o.  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 Adenocarcinoma of Lung, right lower lobe, stage IA (T1c,N0) 2. History of tobacco abuse 3. History of right breast cancer 4. History of GERD (gastroesophageal reflux disease) 5. History of hyperlipidemia 6. History of hypertension 7. History of IBS  (irritable bowel syndrome) 8. History of PONV (postoperative nausea and vomiting) 9. History of pre diabetes 10. History of anxiety 11. History of arthritis  Procedure (s):  Right video-assisted thoracoscopy, wedge resection, right lower lobe nodule, thoracoscopic right lower lobectomy, lymph node dissection, intercostal nerve blocks at levels 3 through 9 by Dr. Roxan Hockey on 07/26/2018.   Pathology: Diagnosis 1. Lymph node, biopsy, Level 9 - ONE LYMPH NODE, NEGATIVE FOR CARCINOMA (0/1). 2. Lymph node, biopsy, Level 7 - ONE LYMPH NODE, NEGATIVE FOR CARCINOMA (0/1). 3. Lymph node, biopsy, Level 11 - ONE LYMPH NODE, NEGATIVE FOR CARCINOMA (0/1). 4. Lymph node, biopsy, Level 12 - ONE LYMPH NODE, NEGATIVE FOR CARCINOMA (0/1). 5. Lymph node, biopsy, Level 10 - ONE LYMPH NODE, NEGATIVE FOR CARCINOMA (0/1). 6. Lymph node, biopsy, Level 12 #2 - ONE LYMPH NODE, NEGATIVE FOR CARCINOMA (0/1). 7. Lung, wedge biopsy/resection, Right Lower Lobe - INVASIVE PULMONARY ADENOCARCINOMA, 2.7 CM. - NO VISCERAL PLEURA INVASION IDENTIFIED. - MARGIN OF RESECTION IS NOT INVOLVED. - SEE ONCOLOGY TABLE. 8. Lymph node, biopsy, Level 12 #3 - ONE LYMPH NODE, NEGATIVE FOR CARCINOMA (0/1). 9. Lymph node, biopsy, Level 13 - ONE LYMPH NODE, NEGATIVE FOR CARCINOMA (0/1). 10. Lymph node, biopsy, Level  13 #2 - ONE LYMPH NODE, NEGATIVE FOR CARCINOMA (0/1). 11. Lymph node, biopsy, Level 7 #2 - ONE LYMPH NODE, NEGATIVE FOR CARCINOMA (0/1). 12. Lung, resection (segmental or lobe), Right Lower Lobe - LUNG TISSUE, INCLUDING ALL MARGINS, NEGATIVE FOR CARCINOMA. - TWO HILAR LYMPH NODES, NEGATIVE FOR CARCINOMA (0/2). - SEE ONCOLOGY TABLE. 1 of 4 FINAL for EDDA, OREA ([id number]) Diagnosis(continued) 13. Lymph node, biopsy, Level 4R - ONE LYMPH NODE, NEGATIVE FOR CARCINOMA (0/1). 14. Lymph node, biopsy, Level 2R - TWO LYMPH NODES, NEGATIVE FOR CARCINOMA (0/2). Pathologic Stage Classification (pTNM, AJCC 8th Edition): pT1c, pN0  History of Presenting Illness: Vanessa Day is a 78 yo woman with a past history of breast cancer, right mastectomy, TRAM flap, hypertension, hyperlipidemia, GERD, IBS, arthritis and peripheral neuropathy of feet. She was being evaluated with a virtual colonoscopy about a year ago. A CT of the abdomen showed an opacity in the right lower lobe. She recently went to Dr. Brigitte Pulse for a physical. She ordered a CT chest to follow up the area. It had increased in size. She was referred to Dr. Julien Nordmann who did a PET which showed hypermetabolic activity with an SUV of 3.1. No regional or distant metastasis noted.  She feels well. No CP, pressure or tightness, shortness of breath, fever, chills, cough, wheezing or hemoptysis. No change in appetite or weight loss. Smoked 1 ppd x 30 years before quitting in 2004.  Dr. Cyndia Bent discussed options for diagnosis and treatment with Vanessa Day. We discussed bronchoscopy for biopsy v proceeding directly to surgical resection. We discussed the relative advantages and disadvantages of  both approaches. Dr. Cyndia Bent recommended we proceed to VATS for definitive diagnosis and treatment of the mass.   Dr. Cyndia Bent discussed the general nature of the procedure, the need for general anesthesia, the incisions to be used and the use of a drainage tube  postoperatively with Vanessa Day. We discussed the expected hospital stay, overall recovery and short and long term outcomes. Dr. Cyndia Bent informed her of the indications, risks, benefits and alternatives. She understands and agrees to proceed with surgery. She underwent an intercostal nerve block, right VATS, thoracoscopic RLL, and lymph node dissection on 07/26/2018.  Brief Hospital Course:  The patient remained afebrile and hemodynamically stable. A line and foley were removed early in the post operative course. Chest tube output gradually decreased. There was a small, intermittent air leak on post op day one. Daily chest x rays were obtained and remained stable. Chest tube was placed to water seal on 07/03. Chest tube continued to have a fair amount of drainage and remained for a few more days. It was removed on 07/08. Follow up chest x ray showed a small, stable right apical pneumothorax with minor subcutaneous emphysema and a small right pleural effusion and atelectasis.  .  Of note, patient had hoarseness post op which was felt related to difficult placement of ET tube initially;however, hoarseness resolved and then recurred. She also had yellow sputum production. She was given lozenges, Chloraseptic PRN, Mucinex, and placed on Azithromycin. Her WBC was within normal on 07/08 at 9700. She also remained afebrile. Etiology may be secondary to URI. She also developed tingling left mid forearm radiating into her fingers. This was felt secondary to stretching of the brachial plexus.  Patient is ambulating on room air. Patient is tolerating a diet and has had a bowel movement. Wounds are clean and dry.  Patient is felt surgically stable for discharge today.   Latest Vital Signs: Blood pressure 127/74, pulse 91, temperature 98.4 F (36.9 C), temperature source Oral, resp. rate 17, height 5\' 3"  (1.6 m), weight 66.6 kg, SpO2 98 %.  Physical Exam: Cardiovascular: RRR Pulmonary: Mostly clear Abdomen: Soft,  non tender, bowel sounds present. Extremities: No lower extremity edema. Wounds: Clean and dry.  No erythema or signs of infection. Trace serous drainage from chest tube wound this am.   Discharge Condition: Stable and discharged to home.  Recent laboratory studies:  Lab Results  Component Value Date   WBC 9.7 08/01/2018   HGB 12.7 08/01/2018   HCT 38.4 08/01/2018   MCV 93.4 08/01/2018   PLT 302 08/01/2018   Lab Results  Component Value Date   NA 141 07/29/2018   K 4.1 07/29/2018   CL 109 07/29/2018   CO2 23 07/29/2018   CREATININE 1.18 (H) 07/29/2018   GLUCOSE 109 (H) 07/29/2018      Diagnostic Studies: Dg Chest 1 View  Result Date: 07/28/2018 CLINICAL DATA:  Pneumothorax. EXAM: CHEST  1 VIEW COMPARISON:  Radiograph of July 27, 2018. FINDINGS: The heart size and mediastinal contours are within normal limits. Atherosclerosis of thoracic aorta is noted. Right internal jugular catheter is unchanged in position. Right-sided chest tube is noted. Minimal right apical pneumothorax is noted. Mild right basilar subsegmental atelectasis is noted. Subcutaneous emphysema is seen over right lateral chest wall. The visualized skeletal structures are unremarkable. IMPRESSION: Right-sided chest tube is unchanged in position. Minimal right apical pneumothorax is noted. Mild right basilar subsegmental atelectasis is noted. Subcutaneous emphysema is seen over right lateral chest wall. Aortic Atherosclerosis (ICD10-I70.0). Electronically  Signed   By: Marijo Conception M.D.   On: 07/28/2018 08:30   Dg Chest 2 View  Result Date: 08/02/2018 CLINICAL DATA:  Status post right lower lobe removal for carcinoma. Recent pneumothorax. EXAM: CHEST - 2 VIEW COMPARISON:  August 01, 2018 FINDINGS: There is a small right apicolateral pneumothorax without tension component. There is subcutaneous air on the right. There is volume loss on the right with atelectatic change in the right base. There is a minimal right pleural  effusion. Left lung is clear. Heart size and pulmonary vascularity are normal. No adenopathy. There is aortic atherosclerosis. No bone lesions. IMPRESSION: Small right apicolateral pneumothorax without tension component. There is subcutaneous air on the right. Postoperative change with volume loss on the right. There is atelectasis in the right base with small right pleural effusion. Left lung clear. Heart size normal. Aortic Atherosclerosis (ICD10-I70.0). Electronically Signed   By: Lowella Grip III M.D.   On: 08/02/2018 07:59   Dg Chest 2 View  Result Date: 07/23/2018 CLINICAL DATA:  Known left lower lobe nodule, preoperative evaluation. EXAM: CHEST - 2 VIEW COMPARISON:  PET-CT from 06/28/2018 FINDINGS: Cardiac shadows within normal limits. Aortic calcifications are again seen. Postsurgical changes in the right breast are noted stable from the previous exams. Stable right lower lobe nodule is noted and stable. IMPRESSION: Stable right lower lobe pulmonary nodule. Electronically Signed   By: Inez Catalina M.D.   On: 07/23/2018 10:22   Dg Chest Port 1 View  Result Date: 08/01/2018 CLINICAL DATA:  Status post chest tube removal EXAM: PORTABLE CHEST 1 VIEW COMPARISON:  August 01, 2018 FINDINGS: The heart size and mediastinal contours are stable. Previously noted right chest tube has been removed. No definite pleural line is identified to suggest pneumothorax. The left lung is clear. Right neck and chest subcutaneous emphysema is identified unchanged. The visualized skeletal structures are unremarkable. IMPRESSION: Status post removal previously noted right chest tube. No pleural line is noted to suggest pneumothorax. Electronically Signed   By: Abelardo Diesel M.D.   On: 08/01/2018 11:39   Dg Chest Port 1 View  Result Date: 08/01/2018 CLINICAL DATA:  78 year old female with a history of chest tube cough and wheezing EXAM: PORTABLE CHEST 1 VIEW COMPARISON:  07/31/2018 FINDINGS: Cardiomediastinal silhouette  unchanged in size and contour. Unchanged position of right thoracostomy tube. Similar appearance of subcutaneous/myofacial gas of the right chest wall and right neck. Surgical changes of the right axilla again noted. No visualized pneumothorax on the current plain film. Elevation of the right hemidiaphragm, similar to the prior. Left lung remains well aerated with no pleural effusion, airspace disease, or pneumothorax. No displaced fracture. IMPRESSION: Unchanged right thoracostomy tube with no visualized pneumothorax. Persisting subcutaneous/myofacial gas of the right neck and right chest wall. Elevation the right hemidiaphragm with basilar atelectasis. Electronically Signed   By: Corrie Mckusick D.O.   On: 08/01/2018 07:45   Dg Chest Port 1 View  Result Date: 07/31/2018 CLINICAL DATA:  Pneumothorax. EXAM: PORTABLE CHEST 1 VIEW COMPARISON:  07/30/2018. FINDINGS: Right chest tube in stable position. Tiny right apical pneumothorax unchanged. Mild right base subsegmental atelectasis with elevation of the right hemidiaphragm again noted. No focal alveolar infiltrate. No pleural effusion. Heart size stable. Right chest wall subcutaneous emphysema again noted. Surgical clips right chest. IMPRESSION: 1. Right chest tube in stable position. Stable tiny right apical pneumothorax. Right chest wall subcutaneous emphysema again noted. 2. Mild right base atelectasis with elevation right hemidiaphragm again noted. Electronically  Signed   By: Marcello Moores  Register   On: 07/31/2018 08:08   Dg Chest Port 1 View  Result Date: 07/30/2018 CLINICAL DATA:  Right pneumothorax. Chest tube in place. EXAM: PORTABLE CHEST 1 VIEW COMPARISON:  07/29/2018 and 05/14/2018 FINDINGS: There is now only a very tiny residual right apical pneumothorax. Right chest tube unchanged. Heart size and vascularity are normal. Minimal atelectasis at the left lung base laterally. No change in the right subcutaneous emphysema. Aortic atherosclerosis. IMPRESSION:  1. Tiny residual right apical pneumothorax. 2. Minimal atelectasis at the left lung base. 3. Aortic atherosclerosis. Electronically Signed   By: Lorriane Shire M.D.   On: 07/30/2018 06:08   Dg Chest Port 1 View  Result Date: 07/29/2018 CLINICAL DATA:  Status post right lower lobectomy. EXAM: PORTABLE CHEST 1 VIEW COMPARISON:  Chest x-ray from yesterday. FINDINGS: Interval removal of the right internal jugular central venous catheter. Unchanged right-sided chest tube and trace right apical pneumothorax. Unchanged volume loss in the right hemithorax due to right lower lobectomy. Improving atelectasis at the right lung base. No focal consolidation, pleural effusion, or pneumothorax. Stable cardiomediastinal silhouette. Normal pulmonary vascularity. No acute osseous abnormality. Unchanged right lateral chest wall subcutaneous emphysema. IMPRESSION: 1. Unchanged trace right apical pneumothorax. Improving aeration at the right lung base. Electronically Signed   By: Titus Dubin M.D.   On: 07/29/2018 08:25   Dg Chest Port 1 View  Result Date: 07/27/2018 CLINICAL DATA:  Pneumothorax. EXAM: PORTABLE CHEST 1 VIEW COMPARISON:  Chest x-ray from yesterday. FINDINGS: Unchanged right internal jugular central venous catheter. Unchanged right-sided chest tube with trace right apical pneumothorax. The heart size and mediastinal contours are within normal limits. Atherosclerotic calcification of the aortic arch. Normal pulmonary vascularity. Unchanged minimal bibasilar atelectasis. No focal consolidation, pleural effusion, or pneumothorax. No acute osseous abnormality. IMPRESSION: 1. Unchanged trace right apical pneumothorax. Electronically Signed   By: Titus Dubin M.D.   On: 07/27/2018 10:02   Dg Chest Port 1 View  Result Date: 07/26/2018 CLINICAL DATA:  Adenocarcinoma of the right lower lobe. Right lower lobectomy. EXAM: PORTABLE CHEST 1 VIEW COMPARISON:  Chest x-ray dated 07/23/2018 FINDINGS: Right chest tube in  good position with a very tiny right apical pneumothorax. Central line tip is just above the cavoatrial junction in good position. Lungs are clear except for minimal atelectasis at the left lung base laterally. Heart size and vascularity are normal. Aortic atherosclerosis. IMPRESSION: 1. Tiny right apical pneumothorax. 2. Minimal atelectasis at the left lung base. 3. Aortic atherosclerosis. Electronically Signed   By: Lorriane Shire M.D.   On: 07/26/2018 12:18     Discharge Medications: Allergies as of 08/02/2018      Reactions   Hydrocodone Itching   Codeine Itching   Statins Other (See Comments)   Muscle soreness   Sulfa Antibiotics    Breakout around face      Medication List    TAKE these medications   acetaminophen 500 MG tablet Commonly known as: TYLENOL Take 1 tablet (500 mg total) by mouth every 6 (six) hours as needed for mild pain.   alosetron 0.5 MG tablet Commonly known as: LOTRONEX Take 0.25 mg by mouth daily.   aspirin 81 MG tablet Take 81 mg by mouth daily.   azithromycin 500 MG tablet Commonly known as: ZITHROMAX Take 1 tablet (500 mg total) by mouth daily. For 4 days then stop.   celecoxib 200 MG capsule Commonly known as: CELEBREX Take 200 mg by mouth 2 (two) times  daily.   cholecalciferol 25 MCG (1000 UT) tablet Commonly known as: VITAMIN D3 Take 1,000 Units by mouth daily.   desipramine 25 MG tablet Commonly known as: NORPRAMIN Take 25 mg by mouth daily.   ezetimibe 10 MG tablet Commonly known as: ZETIA Take 10 mg by mouth daily.   guaiFENesin 600 MG 12 hr tablet Commonly known as: MUCINEX Take 1 tablet (600 mg total) by mouth 2 (two) times daily as needed for cough or to loosen phlegm.   hydrochlorothiazide 25 MG tablet Commonly known as: HYDRODIURIL Take 25 mg by mouth daily.   loratadine 10 MG tablet Commonly known as: CLARITIN Take 10 mg by mouth daily.   LORazepam 1 MG tablet Commonly known as: ATIVAN Take 1-2 mg by mouth at  bedtime.   losartan 100 MG tablet Commonly known as: COZAAR Take 100 mg by mouth daily.   MegaRed Omega-3 Krill Oil 500 MG Caps Take 500 mg by mouth daily.   oxymetazoline 0.05 % nasal spray Commonly known as: AFRIN Place 1 spray into both nostrils at bedtime as needed for congestion.   phenol 1.4 % Liqd Commonly known as: CHLORASEPTIC Use as directed 1 spray in the mouth or throat as needed for throat irritation / pain.   pregabalin 50 MG capsule Commonly known as: LYRICA Take 50 mg by mouth 2 (two) times daily.   SYSTANE OP Place 1 drop into both eyes 2 (two) times daily as needed (dry eyes).   TART CHERRY ADVANCED PO Take by mouth.   traMADol 50 MG tablet Commonly known as: ULTRAM Take 50 mg by mouth every 6 (six) hours as needed for moderate pain.   vitamin E 1000 UNIT capsule Take 1 tablet by mouth daily.       Follow Up Appointments: Follow-up Information    Melrose Nakayama, MD. Go on 08/14/2018.   Specialty: Cardiothoracic Surgery Why: PA/LAT CXR to be taken (at [facility] which is in the same building as Dr. Leonarda Salon office) on 07/21 at 2:15 pm;Appointment time is at 2:45 pm Contact information: [place] [id number] [phone]           Signed: [facility] 08/02/2018, 8:05 AM

## 2018-07-27 NOTE — Progress Notes (Signed)
      [address]       [place] [address]             [phone]    Ambulated earlier today  C/o irritation from Foley  BP 129/62 (BP Location: Right Arm)   Pulse 62   Temp (!) 97.5 F (36.4 C) (Oral)   Resp 11   Ht 5\' 3"  (1.6 m)   Wt 64.9 kg   SpO2 97%   BMI 25.33 kg/m   Intake/Output Summary (Last 24 hours) at 07/27/2018 1705 Last data filed at 07/27/2018 0800 Gross per 24 hour  Intake 1662.38 ml  Output 990 ml  Net 672.38 ml   Doing well  Will dc Foley catheter  Remo Lipps C. Roxan Hockey, MD [facility] [phone]

## 2018-07-27 NOTE — Progress Notes (Addendum)
TCTS DAILY ICU PROGRESS NOTE                   [address]            [place] [address]          [phone]   1 Day Post-Op Procedure(s) (LRB): VIDEO ASSISTED THORACOSCOPY (Right) SEGMENTECTOMY (Right) RIGHT LOWER LOBE LOBECTOMY (Right)  Total Length of Stay:  LOS: 1 day   Subjective: Patient not having much pain. "Beeping from machines" is annoying.  Objective: Vital signs in last 24 hours: Temp:  [96.3 F (35.7 C)-97.5 F (36.4 C)] 97.5 F (36.4 C) (07/03 0410) Pulse Rate:  [59-91] 60 (07/03 0600) Cardiac Rhythm: Other (Comment) (07/03 0400) Resp:  [6-22] 14 (07/03 0600) BP: (94-146)/(48-116) 105/57 (07/03 0600) SpO2:  [91 %-100 %] 96 % (07/03 0600) Arterial Line BP: (77-175)/(61-106) 109/102 (07/03 0600)  Filed Weights   07/26/18 0609  Weight: 64.9 kg    Weight change:       Intake/Output from previous day: 07/02 0701 - 07/03 0700 In: 3687.4 [P.O.:120; I.V.:3367.4; IV Piggyback:200] Out: 1325 [Urine:895; Chest Tube:430]  Intake/Output this shift: No intake/output data recorded.  Current Meds: Scheduled Meds: . acetaminophen  1,000 mg Oral Q6H   Or  . acetaminophen (TYLENOL) oral liquid 160 mg/5 mL  1,000 mg Oral Q6H  . alosetron  0.25 mg Oral Daily  . aspirin  81 mg Oral Daily  . bisacodyl  10 mg Oral Daily  . celecoxib  200 mg Oral BID  . desipramine  25 mg Oral Daily  . enoxaparin (LOVENOX) injection  40 mg Subcutaneous Q24H  . ezetimibe  10 mg Oral Daily  . fentaNYL   Intravenous Q4H  . hydrochlorothiazide  25 mg Oral Daily  . insulin aspart  0-24 Units Subcutaneous Q6H  . loratadine  10 mg Oral Daily  . LORazepam  1-2 mg Oral QHS  . pantoprazole (PROTONIX) IV  40 mg Intravenous Q24H  . pregabalin  50 mg Oral BID  . senna-docusate  1 tablet Oral QHS   Continuous Infusions: . 0.9 % NaCl with KCl 20 mEq / L 100 mL/hr at 07/27/18 0115   PRN Meds:.diphenhydrAMINE **OR** diphenhydrAMINE, naloxone **AND** sodium chloride  flush, ondansetron (ZOFRAN) IV, ondansetron (ZOFRAN) IV, traMADol  General appearance: alert, cooperative and no distress Neurologic: intact Heart: RRR Lungs: Mostly clear bilaterally Abdomen: Soft, non tender, bowel sounds present Extremities: Trace LE edema Wound: Clean and dry Chest tube: to suction, small/intermittent air leak with cough  Lab Results: CBC: Recent Labs    07/27/18 0441 07/27/18 0517  WBC 15.7*  --   HGB 10.7* 10.5*  HCT 32.1* 31.0*  PLT 246  --    BMET:  Recent Labs    07/27/18 0441 07/27/18 0517  NA 140 136  K 4.5 4.3  CL 109  --   CO2 25  --   GLUCOSE 113*  --   BUN 26*  --   CREATININE 1.08*  --   CALCIUM 8.1*  --     CMET: Lab Results  Component Value Date   WBC 15.7 (H) 07/27/2018   HGB 10.5 (L) 07/27/2018   HCT 31.0 (L) 07/27/2018   PLT 246 07/27/2018   GLUCOSE 113 (H) 07/27/2018   ALT 15 07/23/2018   AST 19 07/23/2018   NA 136 07/27/2018   K 4.3 07/27/2018   CL 109 07/27/2018   CREATININE 1.08 (H) 07/27/2018   BUN 26 (H)  07/27/2018   CO2 25 07/27/2018   INR 0.9 07/23/2018      PT/INR: No results for input(s): LABPROT, INR in the last 72 hours. Radiology: Dg Chest Port 1 View  Result Date: 07/26/2018 CLINICAL DATA:  Adenocarcinoma of the right lower lobe. Right lower lobectomy. EXAM: PORTABLE CHEST 1 VIEW COMPARISON:  Chest x-ray dated 07/23/2018 FINDINGS: Right chest tube in good position with a very tiny right apical pneumothorax. Central line tip is just above the cavoatrial junction in good position. Lungs are clear except for minimal atelectasis at the left lung base laterally. Heart size and vascularity are normal. Aortic atherosclerosis. IMPRESSION: 1. Tiny right apical pneumothorax. 2. Minimal atelectasis at the left lung base. 3. Aortic atherosclerosis. Electronically Signed   By: Lorriane Shire M.D.   On: 07/26/2018 12:18     Assessment/Plan: S/P Procedure(s) (LRB): VIDEO ASSISTED THORACOSCOPY (Right) SEGMENTECTOMY  (Right) RIGHT LOWER LOBE LOBECTOMY (Right) 1. CV-on HCTZ  25 mg daily and Losartan prior to surgery. Will likely restart HCTZ in am as SBP in the low 100's this am. 2. Pulmonary-on 2 liters of oxygen via [place]. Chest tube with 430 cc since surgery. Chest tube is to suction and there is a small , intermittent  air leak with cough. CXR this am appears stable-? residual trace right apical ptx. Place chest tube to water seal. Await final pathology. Encourage incentive sprirometer 3. Expected ABL anemia-H and H this am 10.5 and 31 4. CBGs 155/120/95. No prior history of DM. Will stop accu checks and SS PRN. 5. Remove al line;will remove foley in am    Nani Skillern PA-C 07/27/2018 7:03 AM   Patient seen and examined, agree with above Looks good CT to water seal mobilize  [place] C. Roxan Hockey, MD [facility] [phone]

## 2018-07-28 ENCOUNTER — Inpatient Hospital Stay (HOSPITAL_COMMUNITY): Payer: Medicare Other

## 2018-07-28 LAB — COMPREHENSIVE METABOLIC PANEL
ALT: 36 U/L (ref 0–44)
AST: 33 U/L (ref 15–41)
Albumin: 2.7 g/dL — ABNORMAL LOW (ref 3.5–5.0)
Alkaline Phosphatase: 51 U/L (ref 38–126)
Anion gap: 7 (ref 5–15)
BUN: 23 mg/dL (ref 8–23)
CO2: 25 mmol/L (ref 22–32)
Calcium: 8.4 mg/dL — ABNORMAL LOW (ref 8.9–10.3)
Chloride: 110 mmol/L (ref 98–111)
Creatinine, Ser: 1.25 mg/dL — ABNORMAL HIGH (ref 0.44–1.00)
GFR calc Af Amer: 48 mL/min — ABNORMAL LOW (ref >60)
GFR calc non Af Amer: 41 mL/min — ABNORMAL LOW (ref >60)
Glucose, Bld: 105 mg/dL — ABNORMAL HIGH (ref 70–99)
Potassium: 3.9 mmol/L (ref 3.5–5.1)
Sodium: 142 mmol/L (ref 135–145)
Total Bilirubin: 1.4 mg/dL — ABNORMAL HIGH (ref 0.3–1.2)
Total Protein: 5 g/dL — ABNORMAL LOW (ref 6.5–8.1)

## 2018-07-28 LAB — CBC
HCT: 32.4 % — ABNORMAL LOW (ref 36.0–46.0)
Hemoglobin: 10.5 g/dL — ABNORMAL LOW (ref 12.0–15.0)
MCH: 31.4 pg (ref 26.0–34.0)
MCHC: 32.4 g/dL (ref 30.0–36.0)
MCV: 97 fL (ref 80.0–100.0)
Platelets: 229 10*3/uL (ref 150–400)
RBC: 3.34 MIL/uL — ABNORMAL LOW (ref 3.87–5.11)
RDW: 13.1 % (ref 11.5–15.5)
WBC: 9.9 10*3/uL (ref 4.0–10.5)
nRBC: 0 % (ref 0.0–0.2)

## 2018-07-28 MED ORDER — PREGABALIN 25 MG PO CAPS
50.0000 mg | ORAL_CAPSULE | Freq: Two times a day (BID) | ORAL | Status: DC
  Administered 2018-07-28 – 2018-08-02 (×10): 50 mg via ORAL
  Filled 2018-07-28 (×9): qty 2

## 2018-07-28 MED ORDER — SODIUM CHLORIDE 0.9% FLUSH: 10.0000 mL | INTRAVENOUS | Status: DC | PRN

## 2018-07-28 MED ORDER — CHLORHEXIDINE GLUCONATE CLOTH 2 % EX PADS
6.0000 | MEDICATED_PAD | Freq: Every day | CUTANEOUS | Status: DC
  Administered 2018-07-28 – 2018-07-31 (×3): 6 via TOPICAL

## 2018-07-28 MED ORDER — SODIUM CHLORIDE 0.9% FLUSH
10.0000 mL | Freq: Two times a day (BID) | INTRAVENOUS | Status: DC
  Administered 2018-07-28 – 2018-07-30 (×2): 10 mL

## 2018-07-28 NOTE — Progress Notes (Signed)
2 Days Post-Op Procedure(s) (LRB): VIDEO ASSISTED THORACOSCOPY (Right) SEGMENTECTOMY (Right) RIGHT LOWER LOBE LOBECTOMY (Right) Subjective: Some pain from tube, overall feels well Anxious to go home  Objective: Vital signs in last 24 hours: Temp:  [97.4 F (36.3 C)-97.9 F (36.6 C)] 97.6 F (36.4 C) (07/04 0734) Pulse Rate:  [62-81] 69 (07/04 0500) Cardiac Rhythm: Other (Comment) (07/04 0400) Resp:  [10-23] 15 (07/04 0734) BP: (108-137)/(46-97) 118/97 (07/04 0700) SpO2:  [90 %-98 %] 96 % (07/04 0734) Weight:  [66.6 kg] 66.6 kg (07/04 0630)  Hemodynamic parameters for last 24 hours:    Intake/Output from previous day: 07/03 0701 - 07/04 0700 In: 1064.9 [P.O.:720; I.V.:344.9] Out: 2330 [Urine:1960; Chest Tube:370] Intake/Output this shift: No intake/output data recorded.  General appearance: alert, cooperative and no distress Neurologic: intact Heart: regular rate and rhythm Lungs: diminished breath sounds right base' minimal air leak  Lab Results: Recent Labs    07/27/18 0441 07/27/18 0517 07/28/18 0433  WBC 15.7*  --  9.9  HGB 10.7* 10.5* 10.5*  HCT 32.1* 31.0* 32.4*  PLT 246  --  229   BMET:  Recent Labs    07/27/18 0441 07/27/18 0517 07/28/18 0433  NA 140 136 142  K 4.5 4.3 3.9  CL 109  --  110  CO2 25  --  25  GLUCOSE 113*  --  105*  BUN 26*  --  23  CREATININE 1.08*  --  1.25*  CALCIUM 8.1*  --  8.4*    PT/INR: No results for input(s): LABPROT, INR in the last 72 hours. ABG    Component Value Date/Time   PHART 7.386 07/27/2018 0517   HCO3 22.8 07/27/2018 0517   TCO2 24 07/27/2018 0517   ACIDBASEDEF 2.0 07/27/2018 0517   O2SAT 95.0 07/27/2018 0517   CBG (last 3)  Recent Labs    07/27/18 0008 07/27/18 0616 07/27/18 1148  GLUCAP 120* 95 79    Assessment/Plan: S/P Procedure(s) (LRB): VIDEO ASSISTED THORACOSCOPY (Right) SEGMENTECTOMY (Right) RIGHT LOWER LOBE LOBECTOMY (Right) - Doing well POD # 2 Cv- stable RESP- s/p  lobectomy  Small air leak and moderate drainage- leave CT on water seal RENAL- creatinine up slightly- follow ENDO- CBG well controlled Anemia secondary to ABL- mild, follow SCD + enoxaparin for DVT prophylaxis ambulate   LOS: 2 days    Melrose Nakayama 07/28/2018

## 2018-07-29 ENCOUNTER — Inpatient Hospital Stay (HOSPITAL_COMMUNITY): Payer: Medicare Other

## 2018-07-29 LAB — BASIC METABOLIC PANEL
Anion gap: 9 (ref 5–15)
BUN: 20 mg/dL (ref 8–23)
CO2: 23 mmol/L (ref 22–32)
Calcium: 8.4 mg/dL — ABNORMAL LOW (ref 8.9–10.3)
Chloride: 109 mmol/L (ref 98–111)
Creatinine, Ser: 1.18 mg/dL — ABNORMAL HIGH (ref 0.44–1.00)
GFR calc Af Amer: 51 mL/min — ABNORMAL LOW (ref >60)
GFR calc non Af Amer: 44 mL/min — ABNORMAL LOW (ref >60)
Glucose, Bld: 109 mg/dL — ABNORMAL HIGH (ref 70–99)
Potassium: 4.1 mmol/L (ref 3.5–5.1)
Sodium: 141 mmol/L (ref 135–145)

## 2018-07-29 MED ORDER — PANTOPRAZOLE SODIUM 40 MG PO TBEC
40.0000 mg | DELAYED_RELEASE_TABLET | Freq: Every day | ORAL | Status: DC
  Administered 2018-07-29 – 2018-08-02 (×5): 40 mg via ORAL
  Filled 2018-07-29 (×5): qty 1

## 2018-07-29 NOTE — Progress Notes (Addendum)
3 Days Post-Op Procedure(s) (LRB): VIDEO ASSISTED THORACOSCOPY (Right) SEGMENTECTOMY (Right) RIGHT LOWER LOBE LOBECTOMY (Right) Subjective: Pain is improved C/o tingling in left forearm extending to index finger, started yesterday, "not bad" but noticeable  Objective: Vital signs in last 24 hours: Temp:  [97.6 F (36.4 C)-98.1 F (36.7 C)] 98.1 F (36.7 C) (07/05 0739) Pulse Rate:  [65-82] 68 (07/05 0739) Cardiac Rhythm: Normal sinus rhythm (07/05 0700) Resp:  [11-19] 16 (07/05 0752) BP: (109-142)/(52-101) 126/101 (07/05 0739) SpO2:  [96 %-100 %] 96 % (07/05 0752)  Hemodynamic parameters for last 24 hours:    Intake/Output from previous day: 07/04 0701 - 07/05 0700 In: 1070.2 [P.O.:840; I.V.:230.2] Out: 1145 [Urine:715; Chest Tube:430] Intake/Output this shift: No intake/output data recorded.  General appearance: alert, cooperative and no distress Neurologic: intact Heart: regular rate and rhythm Lungs: diminished breath sounds right base Wound: clean and dry large tidal variation, but no air leak, blood tinged serous fluid in CT  Lab Results: Recent Labs    07/27/18 0441 07/27/18 0517 07/28/18 0433  WBC 15.7*  --  9.9  HGB 10.7* 10.5* 10.5*  HCT 32.1* 31.0* 32.4*  PLT 246  --  229   BMET:  Recent Labs    07/28/18 0433 07/29/18 0225  NA 142 141  K 3.9 4.1  CL 110 109  CO2 25 23  GLUCOSE 105* 109*  BUN 23 20  CREATININE 1.25* 1.18*  CALCIUM 8.4* 8.4*    PT/INR: No results for input(s): LABPROT, INR in the last 72 hours. ABG    Component Value Date/Time   PHART 7.386 07/27/2018 0517   HCO3 22.8 07/27/2018 0517   TCO2 24 07/27/2018 0517   ACIDBASEDEF 2.0 07/27/2018 0517   O2SAT 95.0 07/27/2018 0517   CBG (last 3)  Recent Labs    07/27/18 0008 07/27/18 0616 07/27/18 1148  GLUCAP 120* 95 79    Assessment/Plan: S/P Procedure(s) (LRB): VIDEO ASSISTED THORACOSCOPY (Right) SEGMENTECTOMY (Right) RIGHT LOWER LOBE LOBECTOMY (Right) -Overall  looks great She has a large tidal movement in CT with cough but no air leak Moderately large output from CT- serous fluid slightly blood tinged Keep CT for now LUE pain- c/o tingling in left forearm extending to index finger, just started yesterday. Motor and vascular exams normal  ? Mild brachial plexopathy   LOS: 3 days    Melrose Nakayama 07/29/2018

## 2018-07-29 NOTE — Progress Notes (Signed)
Pt Left Forearm IV infiltrated, Pt refused to have another PIV place in, stated "I don't want to be stick again and beeping from the pump is annoying". Pt told RN that she was okay with not having her fentanyl PCA pump and maintenance fluid.

## 2018-07-29 NOTE — Plan of Care (Signed)

## 2018-07-30 ENCOUNTER — Inpatient Hospital Stay (HOSPITAL_COMMUNITY): Payer: Medicare Other

## 2018-07-30 NOTE — Care Management Important Message (Signed)
Important Message  Patient Details  Name: Vanessa Day MRN: [id number] Date of Birth: Mar 04, 1940   Medicare Important Message Given:  Yes     Memory Argue 07/30/2018, 2:01 PM

## 2018-07-30 NOTE — Progress Notes (Addendum)
      [address] [address]       [place] [address]             [phone]       4 Days Post-Op Procedure(s) (LRB): VIDEO ASSISTED THORACOSCOPY (Right) SEGMENTECTOMY (Right) RIGHT LOWER LOBE LOBECTOMY (Right)  Subjective: Patient with tingling mid left forearm down into fingers. She is hoarse again (likely from ETT placement).  Objective: Vital signs in last 24 hours: Temp:  [97.9 F (36.6 C)-98.2 F (36.8 C)] 98.1 F (36.7 C) (07/06 0715) Pulse Rate:  [65-74] 72 (07/06 0715) Cardiac Rhythm: Normal sinus rhythm (07/05 2308) Resp:  [15-18] 16 (07/06 0715) BP: (120-138)/(64-83) 131/69 (07/06 0715) SpO2:  [93 %-100 %] 93 % (07/06 0715)     Intake/Output from previous day: 07/05 0701 - 07/06 0700 In: 480 [P.O.:480] Out: 360 [Chest Tube:360]   Physical Exam:  Cardiovascular: RRR Pulmonary: Clear to auscultation on left and coarse (rub with CT) on right Abdomen: Soft, non tender, bowel sounds present. Extremities: Mild bilateral lower extremity edema. Wounds: Clean and dry.  No erythema or signs of infection. Chest Tube: to water seal, tidling with cough but no true air leak  Lab Results: CBC: Recent Labs    07/28/18 0433  WBC 9.9  HGB 10.5*  HCT 32.4*  PLT 229   BMET:  Recent Labs    07/28/18 0433 07/29/18 0225  NA 142 141  K 3.9 4.1  CL 110 109  CO2 25 23  GLUCOSE 105* 109*  BUN 23 20  CREATININE 1.25* 1.18*  CALCIUM 8.4* 8.4*    PT/INR: No results for input(s): LABPROT, INR in the last 72 hours. ABG:  INR: Will add last result for INR, ABG once components are confirmed Will add last 4 CBG results once components are confirmed  Assessment/Plan: 1. CV-SR in the 70's. On HCTZ  25 mg daily. Will restart Losartan likely at discharge. 2. Pulmonary-on room air. Chest tube with 360 cc since surgery. Chest tube is to water seal. There is tidling with cough but no true air leak. CXR this am appears stable-tiny, residual  right apical  pneumothorax, atelectasis left base. Chest tube to remain for now as still with a fair amount of output. Await final pathology. Encourage incentive sprirometer 3. Expected ABL anemia-Last H and H 10.5 and 31 4. Regarding tingling of left mid fore arm down into fingers, ? stretching of brachial plexus during position prior to surgery. She states nurse tried a peripheral IV and has been hurting since, but because symptoms occur mostly when she lifts her left arm, likely unrelated.   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 Does not want to try gabapentin or Lyrica  Remo Lipps C. Roxan Hockey, MD [facility] [phone]

## 2018-07-30 NOTE — Plan of Care (Signed)

## 2018-07-31 ENCOUNTER — Inpatient Hospital Stay (HOSPITAL_COMMUNITY): Payer: Medicare Other

## 2018-07-31 MED ORDER — AZITHROMYCIN 500 MG PO TABS
500.0000 mg | ORAL_TABLET | Freq: Every day | ORAL | Status: AC
  Administered 2018-07-31 – 2018-08-02 (×3): 500 mg via ORAL
  Filled 2018-07-31 (×3): qty 1

## 2018-07-31 MED ORDER — GUAIFENESIN ER 600 MG PO TB12
600.0000 mg | ORAL_TABLET | Freq: Two times a day (BID) | ORAL | Status: DC
  Administered 2018-07-31 – 2018-08-02 (×5): 600 mg via ORAL
  Filled 2018-07-31 (×5): qty 1

## 2018-07-31 MED ORDER — ACETAMINOPHEN 500 MG PO TABS
1000.0000 mg | ORAL_TABLET | Freq: Four times a day (QID) | ORAL | Status: DC | PRN
  Administered 2018-08-02: 1000 mg via ORAL
  Filled 2018-07-31: qty 2

## 2018-07-31 MED ORDER — MAGIC MOUTHWASH
5.0000 mL | Freq: Three times a day (TID) | ORAL | Status: DC
  Administered 2018-07-31 – 2018-08-02 (×7): 5 mL via ORAL
  Filled 2018-07-31 (×9): qty 5

## 2018-07-31 NOTE — Progress Notes (Addendum)
      [address]       [place] [address]             [phone]       5 Days Post-Op Procedure(s) (LRB): VIDEO ASSISTED THORACOSCOPY (Right) SEGMENTECTOMY (Right) RIGHT LOWER LOBE LOBECTOMY (Right)  Subjective: Patient states she is now completely hoarse. She feels like her neck is "swollen" and she is coughing up yellow mucous.  Objective: Vital signs in last 24 hours: Temp:  [97.8 F (36.6 C)-98.1 F (36.7 C)] 98.1 F (36.7 C) (07/07 0331) Pulse Rate:  [65-73] 73 (07/06 2011) Cardiac Rhythm: Normal sinus rhythm (07/07 0345) Resp:  [12-17] 12 (07/07 0331) BP: (128-146)/(68-80) 146/80 (07/07 0331) SpO2:  [95 %-98 %] 97 % (07/06 2345)     Intake/Output from previous day: 07/06 0701 - 07/07 0700 In: 480 [P.O.:480] Out: 250 [Chest Tube:250]   Physical Exam:  Cardiovascular: RRR Pulmonary: Clear to auscultation on left and slightly coarse (rub with CT) on right Abdomen: Soft, non tender, bowel sounds present. Extremities: Trace bilateral lower extremity edema. Wounds: Clean and dry.  No erythema or signs of infection. Chest Tube: to water seal, tidling with cough but no true air leak  Lab Results: CBC: No results for input(s): WBC, HGB, HCT, PLT in the last 72 hours. BMET:  Recent Labs    07/29/18 0225  NA 141  K 4.1  CL 109  CO2 23  GLUCOSE 109*  BUN 20  CREATININE 1.18*  CALCIUM 8.4*    PT/INR: No results for input(s): LABPROT, INR in the last 72 hours. ABG:  INR: Will add last result for INR, ABG once components are confirmed Will add last 4 CBG results once components are confirmed  Assessment/Plan: 1. CV-SR in the 70's. On HCTZ  25 mg daily. Will restart Losartan at discharge 2. Pulmonary-on room air. Chest tube with 250 cc since surgery. Chest tube is to water seal. There is tidling with cough but no true air leak. CXR this am appears stable-tiny, residual  right apical pneumothorax, minor subcutaneous emphysema, and  atelectasis left base. Chest tube to remain for now as still with a fair amount of output. Await final pathology. Encourage incentive sprirometer 3. Expected ABL anemia-Last H and H 10.5 and 31 4. Regarding hoarseness, PND, mucous-will give Mucinex and discuss with Dr. Roxan Hockey. She does not appear to be developing PNA on CXR. ? URI 5. Regarding tingling of left mid fore arm down into fingers. likely stretching of brachial plexus during position prior to surgery.    Donielle M ZimmermanPA-C 07/31/2018,7:24 AM [phone]  Patient seen and examined, agree with above Hoarseness has worsened significantly- possible laryngitis- will cover with azithromycin and gargle with saline No air leak, drainage decreasing but will leave tube one more day  Remo Lipps C. Roxan Hockey, MD [facility] [phone]

## 2018-07-31 NOTE — Progress Notes (Signed)
Pts tylenol completed , verbal orders given via telephone for reorder by PA Roddenberry on call.

## 2018-08-01 ENCOUNTER — Inpatient Hospital Stay (HOSPITAL_COMMUNITY): Payer: Medicare Other

## 2018-08-01 DIAGNOSIS — C3491 Malignant neoplasm of unspecified part of right bronchus or lung: Secondary | ICD-10-CM

## 2018-08-01 LAB — CBC
HCT: 38.4 % (ref 36.0–46.0)
Hemoglobin: 12.7 g/dL (ref 12.0–15.0)
MCH: 30.9 pg (ref 26.0–34.0)
MCHC: 33.1 g/dL (ref 30.0–36.0)
MCV: 93.4 fL (ref 80.0–100.0)
Platelets: 302 10*3/uL (ref 150–400)
RBC: 4.11 MIL/uL (ref 3.87–5.11)
RDW: 13 % (ref 11.5–15.5)
WBC: 9.7 10*3/uL (ref 4.0–10.5)
nRBC: 0 % (ref 0.0–0.2)

## 2018-08-01 NOTE — Care Management Important Message (Signed)
Important Message  Patient Details  Name: Vanessa Day MRN: [id number] Date of Birth: [birth year]   Medicare Important Message Given:  Yes     Memory Argue 08/01/2018, 1:27 PM

## 2018-08-01 NOTE — Progress Notes (Addendum)
      [address]       [place] [address]             [phone]       6 Days Post-Op Procedure(s) (LRB): VIDEO ASSISTED THORACOSCOPY (Right) SEGMENTECTOMY (Right) RIGHT LOWER LOBE LOBECTOMY (Right)  Subjective: Patient still with hoarseness. She states she coughed up a lot of yellow "stuff" yesterday. She is able to swallow but it hurts.  Objective: Vital signs in last 24 hours: Temp:  [97.5 F (36.4 C)-98.4 F (36.9 C)] 97.9 F (36.6 C) (07/08 0318) Pulse Rate:  [73-81] 75 (07/07 2022) Cardiac Rhythm: Supraventricular tachycardia (07/07 2341) Resp:  [15-18] 15 (07/08 0318) BP: (124-137)/(65-94) 137/68 (07/08 0318) SpO2:  [95 %-99 %] 98 % (07/07 2022)     Intake/Output from previous day: 07/07 0701 - 07/08 0700 In: 240 [P.O.:240] Out: 156 [Chest Tube:156]   Physical Exam:  Cardiovascular: RRR Pulmonary: Clear to auscultation on left and slight rub with CT on right Abdomen: Soft, non tender, bowel sounds present. Extremities: No lower extremity edema. Wounds: Clean and dry.  No erythema or signs of infection. Chest Tube: to water seal, no air leak  Lab Results: CBC: Recent Labs    08/01/18 0329  WBC 9.7  HGB 12.7  HCT 38.4  PLT 302   BMET:  No results for input(s): NA, K, CL, CO2, GLUCOSE, BUN, CREATININE, CALCIUM in the last 72 hours.  PT/INR: No results for input(s): LABPROT, INR in the last 72 hours. ABG:  INR: Will add last result for INR, ABG once components are confirmed Will add last 4 CBG results once components are confirmed  Assessment/Plan: 1. CV-SR in the 70's. On HCTZ  25 mg daily. Will restart Losartan at discharge 2. Pulmonary-on room air. Chest tube with 156 cc since surgery. Chest tube is to water seal. There is tidling with cough but no true air leak. CXR this am appears stable. Will remove chest tube. Final pathology:TNM Code: pT1c, pN0. Dr. Roxan Hockey to discuss with patient.  Encourage incentive sprirometer  3. Anemia resolved as H and H 12.7 and 38.4 this am. 4. Regarding hoarseness, PND, mucous-continue Mucinex, swisha and swallow, She does not appear to be developing PNA on CXR. She was put on Zithromax yesterday? URI. Will discuss with Dr. Roxan Hockey 4. Patient does not want to go home today as she is concerned about hoarseness, etc. Hope to discharge in am   Sharalyn Ink [facility] 08/01/2018,7:23 AM [phone] Patient seen and examined, agree with above  Remo Lipps C. Roxan Hockey, MD [facility] [phone]

## 2018-08-01 NOTE — Plan of Care (Signed)

## 2018-08-02 ENCOUNTER — Inpatient Hospital Stay (HOSPITAL_COMMUNITY): Payer: Medicare Other

## 2018-08-02 ENCOUNTER — Other Ambulatory Visit: Payer: Self-pay | Admitting: *Deleted

## 2018-08-02 MED ORDER — PHENOL 1.4 % MT LIQD: 1.0000 | OROMUCOSAL | 0 refills | Status: DC | PRN

## 2018-08-02 MED ORDER — GUAIFENESIN ER 600 MG PO TB12: 600.0000 mg | ORAL_TABLET | Freq: Two times a day (BID) | ORAL | Status: DC | PRN

## 2018-08-02 MED ORDER — AZITHROMYCIN 500 MG PO TABS: 500.0000 mg | ORAL_TABLET | Freq: Every day | ORAL | 0 refills | Status: DC

## 2018-08-02 MED ORDER — ACETAMINOPHEN 500 MG PO TABS: 500.0000 mg | ORAL_TABLET | Freq: Four times a day (QID) | ORAL | 0 refills | Status: AC | PRN

## 2018-08-02 NOTE — Progress Notes (Addendum)
      [address]       [place] [address]             [phone]       7 Days Post-Op Procedure(s) (LRB): VIDEO ASSISTED THORACOSCOPY (Right) SEGMENTECTOMY (Right) RIGHT LOWER LOBE LOBECTOMY (Right)  Subjective: Patient still with hoarseness and productive cough. She is able to swallow but it hurts.  Objective: Vital signs in last 24 hours: Temp:  [98 F (36.7 C)-98.4 F (36.9 C)] 98.4 F (36.9 C) (07/09 0310) Pulse Rate:  [84-91] 91 (07/08 1904) Cardiac Rhythm: Normal sinus rhythm (07/09 0036) Resp:  [15-20] 17 (07/09 0310) BP: (110-144)/(68-90) 127/74 (07/09 0310) SpO2:  [94 %-98 %] 98 % (07/08 1904)     Intake/Output from previous day: 07/08 0701 - 07/09 0700 In: 600 [P.O.:600] Out: -    Physical Exam:  Cardiovascular: RRR Pulmonary: Mostly clear Abdomen: Soft, non tender, bowel sounds present. Extremities: No lower extremity edema. Wounds: Clean and dry.  No erythema or signs of infection. Trace serous drainage from chest tube wound this am.  Lab Results: CBC: Recent Labs    08/01/18 0329  WBC 9.7  HGB 12.7  HCT 38.4  PLT 302   BMET:  No results for input(s): NA, K, CL, CO2, GLUCOSE, BUN, CREATININE, CALCIUM in the last 72 hours.  PT/INR: No results for input(s): LABPROT, INR in the last 72 hours. ABG:  INR: Will add last result for INR, ABG once components are confirmed Will add last 4 CBG results once components are confirmed  Assessment/Plan: 1. CV-SR in the 80's. On HCTZ  25 mg daily. Will restart Losartan at discharge 2. Pulmonary-on room air. Await this am's CXR   Encourage incentive sprirometer 3. Regarding hoarseness, PND, mucous-continue Mucinex, swisha and swallow, She does not appear to be developing PNA on CXR. On Zithromax since Tuesday and will continue for a total of 7 days? URI, bronchitis 4. As discussed with Dr. Roxan Day, will discharge today   [facility] 08/02/2018,7:26 AM [phone]

## 2018-08-02 NOTE — Progress Notes (Signed)
Pt left unit for home, accompanied by nurse tech . Taken out in wheelchair.

## 2018-08-02 NOTE — Progress Notes (Signed)
The proposed treatment discussion at cancer conference 08/02/2018 is for discussion purpose only and is not a binding recommendation.  The patient was not physically examined or present for their treatment options. Therefore, final treatment options cannot be decided.

## 2018-08-06 ENCOUNTER — Other Ambulatory Visit: Payer: Self-pay

## 2018-08-06 DIAGNOSIS — B370 Candidal stomatitis: Secondary | ICD-10-CM

## 2018-08-06 MED ORDER — FLUCONAZOLE 100 MG PO TABS: 100.0000 mg | ORAL_TABLET | Freq: Every day | ORAL | 0 refills | Status: DC

## 2018-08-06 NOTE — Telephone Encounter (Signed)
-----   Message from Melrose Nakayama, MD sent at 08/06/2018  1:27 PM EDT ----- Regarding: RE: Lost Voice Give her a prescription for diflucan 100 mg PO daily for 5 days Have her come by the office tomorrow  [facility] ----- Message ----- From: Marylen Ponto, LPN Sent: 0/15/6153  10:19 AM EDT To: Melrose Nakayama, MD Subject: Lost Voice                                     [name] called for wife this AM, she is C/O loosing her voice, sore throat that has gotten worse since hospital discharge. Her tongue and throat are burning.  She denies any fevers, no cough, no white film on tongue or throat She finished the ABX and is continuing the mucinex and salt water gargles  I told him it's probably from the ET tube but I would run it by you. Please Advise Vanessa Day. She is schedule to see you next week

## 2018-08-07 ENCOUNTER — Ambulatory Visit
Admission: RE | Admit: 2018-08-07 | Discharge: 2018-08-07 | Disposition: A | Discharge status: Home / Self Care | Payer: Medicare Other | Source: Ambulatory Visit | Attending: Thoracic Surgery (Cardiothoracic Vascular Surgery) | Admitting: Thoracic Surgery (Cardiothoracic Vascular Surgery)

## 2018-08-07 ENCOUNTER — Ambulatory Visit (INDEPENDENT_AMBULATORY_CARE_PROVIDER_SITE_OTHER): Payer: Self-pay | Admitting: Thoracic Surgery (Cardiothoracic Vascular Surgery)

## 2018-08-07 ENCOUNTER — Other Ambulatory Visit: Payer: Self-pay

## 2018-08-07 VITALS — BP 122/74 | HR 84 | Temp 97.7°F | Resp 20 | Ht 63.0 in | Wt 138.0 lb

## 2018-08-07 DIAGNOSIS — J029 Acute pharyngitis, unspecified: Secondary | ICD-10-CM

## 2018-08-07 DIAGNOSIS — C3491 Malignant neoplasm of unspecified part of right bronchus or lung: Secondary | ICD-10-CM

## 2018-08-07 DIAGNOSIS — Z902 Acquired absence of lung [part of]: Secondary | ICD-10-CM

## 2018-08-07 DIAGNOSIS — C3431 Malignant neoplasm of lower lobe, right bronchus or lung: Secondary | ICD-10-CM

## 2018-08-07 NOTE — Progress Notes (Signed)
cxr 

## 2018-08-07 NOTE — Progress Notes (Signed)
[address]       [place] [address]             [phone]    HPI: [name] returns with a complaint of hoarseness  Vanessa Day is a 78 year old woman with a history of breast cancer, hypertension, hyperlipidemia, reflux, IBS, arthritis, and peripheral neuropathy.  She was incidentally found to have a lung nodule on a virtual colonoscopy.  Over time the nodule increased in size.  On PET it was hypermetabolic with an SUV of 3.1.  She does have a 30-pack-year history of smoking.  She quit in 2004.  I did a thoracoscopic right lower lobectomy on 07/26/2018.  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  On postop day 3 she noted some tingling in her left index finger and thumb consistent with brachial plexopathy.  On day 4 she noted hoarseness which had been present on day 1 but had improved in the interim.  She was a difficult intubation.  She was discharged home on postoperative day #7.  She called in yesterday with a complaint of increasing hoarseness.  We started her on Diflucan in case there was a component of thrush.  She says that her voice is a little better today although still very quiet.  Past Medical History:  Diagnosis Date  . Anxiety   . Arthritis    back  . Cancer ([place]) 2000   rt breast cancer  . Family history of breast cancer   . GERD (gastroesophageal reflux disease)   . H/O hiatal hernia   . Hyperlipidemia   . Hypertension   . IBS (irritable bowel syndrome)   . Neck pain   . PONV (postoperative nausea and vomiting)   . Pre-diabetes      Current Outpatient Medications  Medication Sig Dispense Refill  . acetaminophen (TYLENOL) 500 MG tablet Take 1 tablet (500 mg total) by mouth every 6 (six) hours as needed for mild pain. 30 tablet 0  . alosetron (LOTRONEX) 0.5 MG tablet Take 0.25 mg by mouth daily.    Marland Kitchen aspirin 81 MG tablet Take 81 mg by mouth daily.    . celecoxib (CELEBREX) 200 MG capsule Take 200 mg by mouth 2 (two)  times daily.    . cholecalciferol (VITAMIN D3) 25 MCG (1000 UT) tablet Take 1,000 Units by mouth daily.    Marland Kitchen desipramine (NORPRAMIN) 25 MG tablet Take 25 mg by mouth daily.    Marland Kitchen ezetimibe (ZETIA) 10 MG tablet Take 10 mg by mouth daily.    . fluconazole (DIFLUCAN) 100 MG tablet Take 1 tablet (100 mg total) by mouth daily. 5 tablet 0  . guaiFENesin (MUCINEX) 600 MG 12 hr tablet Take 1 tablet (600 mg total) by mouth 2 (two) times daily as needed for cough or to loosen phlegm.    . hydrochlorothiazide (HYDRODIURIL) 25 MG tablet Take 25 mg by mouth daily.    Marland Kitchen loratadine (CLARITIN) 10 MG tablet Take 10 mg by mouth daily.    Marland Kitchen LORazepam (ATIVAN) 1 MG tablet Take 1-2 mg by mouth at bedtime.     Marland Kitchen losartan (COZAAR) 100 MG tablet Take 100 mg by mouth daily.    [place]nee Spring Omega-3 Krill Oil 500 MG CAPS Take 500 mg by mouth daily.    Marland Kitchen oxymetazoline (AFRIN) 0.05 % nasal spray Place 1 spray into both nostrils at bedtime as needed for congestion.    . phenol (CHLORASEPTIC) 1.4 %  LIQD Use as directed 1 spray in the mouth or throat as needed for throat irritation / pain.  0  . Polyethyl Glycol-Propyl Glycol (SYSTANE OP) Place 1 drop into both eyes 2 (two) times daily as needed (dry eyes).    . pregabalin (LYRICA) 50 MG capsule Take 50 mg by mouth 2 (two) times daily.     . traMADol (ULTRAM) 50 MG tablet Take 50 mg by mouth every 6 (six) hours as needed for moderate pain.    . vitamin E 1000 UNIT capsule Take 1 tablet by mouth daily.     No current facility-administered medications for this visit.     Physical Exam BP 122/74   Pulse 84   Temp 97.7 F (36.5 C) (Skin)   Resp 20   Ht 5\' 3"  (1.6 m)   Wt 138 lb (62.6 kg)   SpO2 95% Comment: RA  BMI 24.22 kg/m  78 year old woman in no acute distress Hoarse voice Alert and oriented x3 with no motor deficit, decreased sensation left thumb and index finger Incision healing well Lungs diminished right base, otherwise clear  Diagnostic Tests: CHEST - 2  VIEW  COMPARISON:  Radiographs of August 02, 2018.  FINDINGS: The heart size and mediastinal contours are within normal limits. No pneumothorax is noted. Atherosclerosis of thoracic aorta is noted. Left lung is clear. Stable right basilar scarring and other postoperative changes are noted. The visualized skeletal structures are unremarkable.  IMPRESSION: Stable right basilar scarring and other postoperative changes are noted. No pneumothorax is noted.  Aortic Atherosclerosis (ICD10-I70.0).   Electronically Signed   By: Marijo Conception M.D.   On: 08/07/2018 09:29 I personally reviewed the chest x-ray images and concur with the findings noted above  Impression: Vanessa Day is a 78 year old former smoker who had a stage Ia adenocarcinoma resected with a right lower lobectomy on 07/26/2018.  She is having minimal discomfort.  She says is the least painful surgery she has had.  Hoarseness-persistent now almost 2 weeks after surgery.  She was a difficult intubation so that is the most likely source.   She has some minimal erythema in her oropharynx. There could be a component of thrush so we started her on Diflucan.  Recurrent nerve injury during lymph node dissection is also a possibility.  There is really nothing more to do at this time except allow it to run its course and will see how she is doing in a couple of weeks.  At that point can consider referral to ENT if necessary.  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  She does have some numbness and unusual sensations in her index finger and thumb on the left hand.  Should resolve with time.  Plan: Complete 1 week of Diflucan Return in 3 weeks  Melrose Nakayama, MD [facility] [phone]

## 2018-08-14 ENCOUNTER — Ambulatory Visit: Payer: Medicare Other | Admitting: Thoracic Surgery (Cardiothoracic Vascular Surgery)

## 2018-08-28 ENCOUNTER — Ambulatory Visit: Payer: Self-pay | Admitting: Thoracic Surgery (Cardiothoracic Vascular Surgery)

## 2018-08-28 ENCOUNTER — Other Ambulatory Visit: Payer: Self-pay

## 2018-08-28 ENCOUNTER — Encounter: Payer: Self-pay | Admitting: Thoracic Surgery (Cardiothoracic Vascular Surgery)

## 2018-08-28 VITALS — BP 126/75 | HR 77 | Temp 97.8°F | Resp 20 | Ht 63.0 in | Wt 140.0 lb

## 2018-08-28 DIAGNOSIS — C3491 Malignant neoplasm of unspecified part of right bronchus or lung: Secondary | ICD-10-CM

## 2018-08-28 NOTE — Progress Notes (Signed)
[address]       [place] [address]             [phone]     HPI: [name] returns for scheduled follow-up visit  Vanessa Day is a 78 year old woman with a history of breast cancer, hypertension, hyperlipidemia, reflux, IBS, arthritis, and peripheral neuropathy.  She has a 30-pack-year history of smoking, but quit in 2004.  She was incidentally found to have a lung nodule on a CT of the abdomen.  Over time the nodule increased in size.  It was found to be hypermetabolic on PET/CT.  SUV was 3.1.  I did a thoracoscopic right lower lobectomy on 07/26/2018.  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  Postop she had hoarseness which really developed several days after intubation.  She also had some brachial plexopathy symptoms in the left hand.  Her hoarseness got worse and we treated her empirically with Diflucan.  In the interim since her last visit her hoarseness has improved significantly.  Her voice is still not 100% of normal but it is better.  She still has some pins and needle sensation in the left index finger that is intermittent.  It is less frequent than it was a month ago.  She complains of dizziness, particularly if she bends over to put on her shoes and gets up quickly.  Past Medical History:  Diagnosis Date  . Adenocarcinoma of lung, stage 1, right ([place]) 2020  . Anxiety   . Arthritis    back  . Cancer ([place]) 2000   rt breast cancer  . Family history of breast cancer   . GERD (gastroesophageal reflux disease)   . H/O hiatal hernia   . Hyperlipidemia   . Hypertension   . IBS (irritable bowel syndrome)   . Neck pain   . PONV (postoperative nausea and vomiting)   . Pre-diabetes     Current Outpatient Medications  Medication Sig Dispense Refill  . acetaminophen (TYLENOL) 500 MG tablet Take 1 tablet (500 mg total) by mouth every 6 (six) hours as needed for mild pain. 30 tablet 0  . alosetron (LOTRONEX) 0.5 MG tablet Take 0.25 mg by  mouth daily.    Marland Kitchen aspirin 81 MG tablet Take 81 mg by mouth daily.    . celecoxib (CELEBREX) 200 MG capsule Take 200 mg by mouth 2 (two) times daily.    . cholecalciferol (VITAMIN D3) 25 MCG (1000 UT) tablet Take 1,000 Units by mouth daily.    Marland Kitchen ezetimibe (ZETIA) 10 MG tablet Take 10 mg by mouth daily.    Marland Kitchen guaiFENesin (MUCINEX) 600 MG 12 hr tablet Take 1 tablet (600 mg total) by mouth 2 (two) times daily as needed for cough or to loosen phlegm.    . hydrochlorothiazide (HYDRODIURIL) 25 MG tablet Take 25 mg by mouth daily.    Marland Kitchen loratadine (CLARITIN) 10 MG tablet Take 10 mg by mouth daily.    Marland Kitchen LORazepam (ATIVAN) 1 MG tablet Take 1-2 mg by mouth at bedtime.     Marland Kitchen losartan (COZAAR) 100 MG tablet Take 100 mg by mouth daily.    Marnee Spring Omega-3 Krill Oil 500 MG CAPS Take 500 mg by mouth daily.    Marland Kitchen oxymetazoline (AFRIN) 0.05 % nasal spray Place 1 spray into both nostrils at bedtime as needed for congestion.    . phenol (CHLORASEPTIC) 1.4 % LIQD Use as directed 1 spray in the mouth or throat  as needed for throat irritation / pain.  0  . Polyethyl Glycol-Propyl Glycol (SYSTANE OP) Place 1 drop into both eyes 2 (two) times daily as needed (dry eyes).    . pregabalin (LYRICA) 50 MG capsule Take 50 mg by mouth 2 (two) times daily.     . traMADol (ULTRAM) 50 MG tablet Take 50 mg by mouth every 6 (six) hours as needed for moderate pain.    . vitamin E 1000 UNIT capsule Take 1 tablet by mouth daily.     No current facility-administered medications for this visit.     Physical Exam BP 126/75   Pulse 77   Temp 97.8 F (36.6 C) (Skin)   Resp 20   Ht 5\' 3"  (1.6 m)   Wt 140 lb (63.5 kg)   SpO2 96% Comment: RA  BMI 24.8 kg/m  78 year old woman in no acute distress Alert and oriented x3 with no focal deficits Lungs diminished at right base, otherwise clear Incisions well-healed Cardiac regular rate and rhythm No peripheral edema  Diagnostic Tests: CHEST - 2 VIEW  COMPARISON:  Radiographs of  August 02, 2018.  FINDINGS: The heart size and mediastinal contours are within normal limits. No pneumothorax is noted. Atherosclerosis of thoracic aorta is noted. Left lung is clear. Stable right basilar scarring and other postoperative changes are noted. The visualized skeletal structures are unremarkable.  IMPRESSION: Stable right basilar scarring and other postoperative changes are noted. No pneumothorax is noted.  Aortic Atherosclerosis (ICD10-I70.0).   Electronically Signed   By: Marijo Conception M.D.   On: 08/07/2018 09:29 I personally reviewed the chest x-ray images and concur with the findings noted above  Impression: Vanessa Day 78 year old woman with a history of tobacco abuse (quit 2004),breast cancer, hypertension, hyperlipidemia, reflux, IBS, arthritis, and peripheral neuropathy.  She had a thoracoscopic right lower lobectomy for stage Ia adenocarcinoma on 07/26/2018.  She is now a month out from surgery and is doing well.  Hoarseness-likely secondary to traumatic intubation. She was a difficult airway.  There may have also been a component of thrush.  Her voice has improved dramatically.  Brachial plexopathy-symptoms are improving.  Should resolve over time.  She is already on Lyrica for peripheral neuropathy in her feet.  Plan:  Follow-up with Dr. Julien Nordmann Return in 6 months with PA and lateral chest x-ray  Melrose Nakayama, MD [facility] [phone]

## 2018-08-29 ENCOUNTER — Encounter: Payer: Self-pay | Admitting: *Deleted

## 2018-08-29 NOTE — Progress Notes (Signed)
Oncology Nurse Navigator Documentation  Oncology Nurse Navigator Flowsheets 08/29/2018  Diagnosis Status Confirmed Diagnosis Complete  Planned Course of Treatment Surgery  Navigator Follow Up Date: 08/29/2018  Navigator Follow Up Reason: Follow-up Appointment  Navigator Location [facility]  Referral Date to RadOnc/MedOnc -  Navigator Encounter Type Other/patient completed surgery and needs a follow up with Med Onc. I completed scheduling message for her to be schedule in a few weeks per Dr. Julien Nordmann.   Telephone -  Abnormal Finding Date 05/06/2018  Confirmed Diagnosis Date 07/26/2018  Multidisiplinary Clinic Date -  Treatment Initiated Date 07/26/2018  Patient Visit Type -  Treatment Phase Follow-up  Barriers/Navigation Needs Coordination of Care  Education -  Interventions Coordination of Care  Coordination of Care Other  Education Method -  Support Groups/Services -  Acuity Level 2  Time Spent with Patient 30

## 2018-08-31 ENCOUNTER — Telehealth: Payer: Self-pay | Admitting: Internal Medicine

## 2018-08-31 NOTE — Telephone Encounter (Signed)
Scheduled appt per 8/05 sch message - pt aware of appt date and time

## 2018-09-17 ENCOUNTER — Other Ambulatory Visit: Payer: Self-pay

## 2018-09-17 DIAGNOSIS — C3491 Malignant neoplasm of unspecified part of right bronchus or lung: Secondary | ICD-10-CM

## 2018-09-18 ENCOUNTER — Encounter: Payer: Self-pay | Admitting: Internal Medicine

## 2018-09-18 ENCOUNTER — Other Ambulatory Visit: Payer: Self-pay

## 2018-09-18 ENCOUNTER — Inpatient Hospital Stay: Payer: Medicare Other

## 2018-09-18 ENCOUNTER — Inpatient Hospital Stay: Payer: Medicare Other | Attending: Internal Medicine | Admitting: Internal Medicine

## 2018-09-18 DIAGNOSIS — C3431 Malignant neoplasm of lower lobe, right bronchus or lung: Secondary | ICD-10-CM | POA: Insufficient documentation

## 2018-09-18 DIAGNOSIS — Z902 Acquired absence of lung [part of]: Secondary | ICD-10-CM | POA: Insufficient documentation

## 2018-09-18 DIAGNOSIS — Z90710 Acquired absence of both cervix and uterus: Secondary | ICD-10-CM | POA: Insufficient documentation

## 2018-09-18 DIAGNOSIS — Z79899 Other long term (current) drug therapy: Secondary | ICD-10-CM | POA: Diagnosis not present

## 2018-09-18 DIAGNOSIS — Z803 Family history of malignant neoplasm of breast: Secondary | ICD-10-CM | POA: Insufficient documentation

## 2018-09-18 DIAGNOSIS — Z7982 Long term (current) use of aspirin: Secondary | ICD-10-CM | POA: Diagnosis not present

## 2018-09-18 DIAGNOSIS — R911 Solitary pulmonary nodule: Secondary | ICD-10-CM | POA: Diagnosis present

## 2018-09-18 DIAGNOSIS — I10 Essential (primary) hypertension: Secondary | ICD-10-CM | POA: Insufficient documentation

## 2018-09-18 DIAGNOSIS — C3491 Malignant neoplasm of unspecified part of right bronchus or lung: Secondary | ICD-10-CM

## 2018-09-18 DIAGNOSIS — C3490 Malignant neoplasm of unspecified part of unspecified bronchus or lung: Secondary | ICD-10-CM

## 2018-09-18 DIAGNOSIS — F419 Anxiety disorder, unspecified: Secondary | ICD-10-CM | POA: Diagnosis not present

## 2018-09-18 LAB — CBC WITH DIFFERENTIAL (CANCER CENTER ONLY)
Abs Immature Granulocytes: 0.06 10*3/uL (ref 0.00–0.07)
Basophils Absolute: 0 10*3/uL (ref 0.0–0.1)
Basophils Relative: 0 %
Eosinophils Absolute: 0 10*3/uL (ref 0.0–0.5)
Eosinophils Relative: 0 %
HCT: 40.2 % (ref 36.0–46.0)
Hemoglobin: 13 g/dL (ref 12.0–15.0)
Immature Granulocytes: 1 %
Lymphocytes Relative: 13 %
Lymphs Abs: 1.6 10*3/uL (ref 0.7–4.0)
MCH: 30.2 pg (ref 26.0–34.0)
MCHC: 32.3 g/dL (ref 30.0–36.0)
MCV: 93.5 fL (ref 80.0–100.0)
Monocytes Absolute: 0.5 10*3/uL (ref 0.1–1.0)
Monocytes Relative: 4 %
Neutro Abs: 10.6 10*3/uL — ABNORMAL HIGH (ref 1.7–7.7)
Neutrophils Relative %: 82 %
Platelet Count: 334 10*3/uL (ref 150–400)
RBC: 4.3 MIL/uL (ref 3.87–5.11)
RDW: 13.6 % (ref 11.5–15.5)
WBC Count: 12.9 10*3/uL — ABNORMAL HIGH (ref 4.0–10.5)
nRBC: 0 % (ref 0.0–0.2)

## 2018-09-18 LAB — CMP (CANCER CENTER ONLY)
ALT: 16 U/L (ref 0–44)
AST: 12 U/L — ABNORMAL LOW (ref 15–41)
Albumin: 3.8 g/dL (ref 3.5–5.0)
Alkaline Phosphatase: 71 U/L (ref 38–126)
Anion gap: 10 (ref 5–15)
BUN: 34 mg/dL — ABNORMAL HIGH (ref 8–23)
CO2: 28 mmol/L (ref 22–32)
Calcium: 8.8 mg/dL — ABNORMAL LOW (ref 8.9–10.3)
Chloride: 102 mmol/L (ref 98–111)
Creatinine: 1.29 mg/dL — ABNORMAL HIGH (ref 0.44–1.00)
GFR, Est AFR Am: 46 mL/min — ABNORMAL LOW (ref >60)
GFR, Estimated: 40 mL/min — ABNORMAL LOW (ref >60)
Glucose, Bld: 168 mg/dL — ABNORMAL HIGH (ref 70–99)
Potassium: 3.6 mmol/L (ref 3.5–5.1)
Sodium: 140 mmol/L (ref 135–145)
Total Bilirubin: 0.8 mg/dL (ref 0.3–1.2)
Total Protein: 7 g/dL (ref 6.5–8.1)

## 2018-09-18 NOTE — Progress Notes (Signed)
[place] [phone]   [phone]  OFFICE PROGRESS NOTE  [name], MD [address] [place] [address]  DIAGNOSIS: T1a (T1c, N0, M0) non-small cell lung cancer, adenocarcinoma presented with right lower lobe lung nodule in June 2020.  PRIOR THERAPY: Right video-assisted thoracoscopy, Wedge resection right lower lobe nodule, Thoracoscopic right lower lobectomy, Lymph node dissection on July 26, 2018 under the care of Dr. Roxan Hockey  CURRENT THERAPY: Observation.  INTERVAL HISTORY: Vanessa Day 78 y.o. female returns to the clinic today for follow-up visit.  The patient recently underwent right lower lobectomy with lymph node dissection under the care of Dr. Roxan Hockey on July 26, 2018.  The final pathology was consistent with non-small cell lung cancer, adenocarcinoma with the tumor measurement of 2.7 cm with no lymphovascular invasion or visceral pleural involvement or lymph node metastasis.  The patient is recovering well from her surgery except for right-sided chest wall soreness.  She denied having any shortness of breath, cough or hemoptysis.  She denied having any fever or chills.  She has no recent weight loss or night sweats.  She has no headache or visual changes.  She is here today for evaluation and discussion of her postoperative treatment options.   MEDICAL HISTORY: Past Medical History:  Diagnosis Date  . Adenocarcinoma of lung, stage 1, right ([place]) 2020  . Anxiety   . Arthritis    back  . Cancer ([place]) 2000   rt breast cancer  . Family history of breast cancer   . GERD (gastroesophageal reflux disease)   . H/O hiatal hernia   . Hyperlipidemia   . Hypertension   . IBS (irritable bowel syndrome)   . Neck pain   . PONV (postoperative nausea and vomiting)   . Pre-diabetes     ALLERGIES:  is allergic to hydrocodone; codeine; statins; and sulfa antibiotics.  MEDICATIONS:  Current Outpatient  Medications  Medication Sig Dispense Refill  . acetaminophen (TYLENOL) 500 MG tablet Take 1 tablet (500 mg total) by mouth every 6 (six) hours as needed for mild pain. 30 tablet 0  . alosetron (LOTRONEX) 0.5 MG tablet Take 0.25 mg by mouth daily.    Marland Kitchen aspirin 81 MG tablet Take 81 mg by mouth daily.    . celecoxib (CELEBREX) 200 MG capsule Take 200 mg by mouth 2 (two) times daily.    . cholecalciferol (VITAMIN D3) 25 MCG (1000 UT) tablet Take 1,000 Units by mouth daily.    Marland Kitchen ezetimibe (ZETIA) 10 MG tablet Take 10 mg by mouth daily.    Marland Kitchen guaiFENesin (MUCINEX) 600 MG 12 hr tablet Take 1 tablet (600 mg total) by mouth 2 (two) times daily as needed for cough or to loosen phlegm.    . hydrochlorothiazide (HYDRODIURIL) 25 MG tablet Take 25 mg by mouth daily.    Marland Kitchen loratadine (CLARITIN) 10 MG tablet Take 10 mg by mouth daily.    Marland Kitchen LORazepam (ATIVAN) 1 MG tablet Take 1-2 mg by mouth at bedtime.     Marland Kitchen losartan (COZAAR) 100 MG tablet Take 100 mg by mouth daily.    Marnee Spring Omega-3 Krill Oil 500 MG CAPS Take 500 mg by mouth daily.    Marland Kitchen oxymetazoline (AFRIN) 0.05 % nasal spray Place 1 spray into both nostrils at bedtime as needed for congestion.    . phenol (CHLORASEPTIC) 1.4 % LIQD Use as directed 1 spray in the mouth or throat as needed for  throat irritation / pain.  0  . Polyethyl Glycol-Propyl Glycol (SYSTANE OP) Place 1 drop into both eyes 2 (two) times daily as needed (dry eyes).    . predniSONE (DELTASONE) 10 MG tablet     . pregabalin (LYRICA) 50 MG capsule Take 50 mg by mouth 2 (two) times daily.     . traMADol (ULTRAM) 50 MG tablet Take 50 mg by mouth every 6 (six) hours as needed for moderate pain.    . vitamin E 1000 UNIT capsule Take 1 tablet by mouth daily.     No current facility-administered medications for this visit.     SURGICAL HISTORY:  Past Surgical History:  Procedure Laterality Date  . ABDOMINAL HYSTERECTOMY    . APPENDECTOMY    . BACK SURGERY    . BREAST BIOPSY    . BREAST  SURGERY    . CARPOMETACARPEL SUSPENSION PLASTY Left 08/25/2015   Procedure: SUSPENSIONPLASTY LEFT THUMB TRAPEZIUM EXCISION ABDUCTOR POLLICIS LONGUS TRANSFER;  Surgeon: Daryll Brod, MD;  Location: [facility];  Service: Orthopedics;  Laterality: Left;  . CHOLECYSTECTOMY    . EYE SURGERY     cataract right and left eye  . GAS INSERTION  06/23/2011   Procedure: INSERTION OF GAS;  Surgeon: Hayden Pedro, MD;  Location: [facility];  Service: Ophthalmology;  Laterality: Left;  SF6  . LOBECTOMY Right 07/26/2018   Procedure: RIGHT LOWER LOBE LOBECTOMY;  Surgeon: Melrose Nakayama, MD;  Location: [facility];  Service: Thoracic;  Laterality: Right;  . MASTECTOMY     right breast  . SCLERAL BUCKLE  06/23/2011   Procedure: SCLERAL BUCKLE;  Surgeon: Hayden Pedro, MD;  Location: [facility];  Service: Ophthalmology;  Laterality: Left;  . SEGMENTECOMY Right 07/26/2018   Procedure: SEGMENTECTOMY;  Surgeon: Melrose Nakayama, MD;  Location: [facility];  Service: Thoracic;  Laterality: Right;  . TONSILLECTOMY    . VIDEO ASSISTED THORACOSCOPY Right 07/26/2018   Procedure: VIDEO ASSISTED THORACOSCOPY;  Surgeon: Melrose Nakayama, MD;  Location: [facility];  Service: Thoracic;  Laterality: Right;    REVIEW OF SYSTEMS:  Constitutional: negative Eyes: negative Ears, nose, mouth, throat, and face: negative Respiratory: positive for pleurisy/chest pain Cardiovascular: negative Gastrointestinal: negative Genitourinary:negative Integument/breast: negative Hematologic/lymphatic: negative Musculoskeletal:negative Neurological: negative Behavioral/Psych: negative Endocrine: negative Allergic/Immunologic: negative   PHYSICAL EXAMINATION: General appearance: alert, cooperative and no distress Head: Normocephalic, without obvious abnormality, atraumatic Neck: no adenopathy, no JVD, supple, symmetrical, trachea midline and thyroid not enlarged, symmetric, no tenderness/mass/nodules Lymph nodes: Cervical,  supraclavicular, and axillary nodes normal. Resp: clear to auscultation bilaterally Back: symmetric, no curvature. ROM normal. No CVA tenderness. Cardio: regular rate and rhythm, S1, S2 normal, no murmur, click, rub or gallop GI: soft, non-tender; bowel sounds normal; no masses,  no organomegaly Extremities: extremities normal, atraumatic, no cyanosis or edema Neurologic: Alert and oriented X 3, normal strength and tone. Normal symmetric reflexes. Normal coordination and gait  ECOG PERFORMANCE STATUS: 0 - Asymptomatic  Blood pressure 120/68, pulse 60, temperature 98.5 F (36.9 C), temperature source Oral, resp. rate 18, height 5\' 3"  (1.6 m), weight 142 lb 1.6 oz (64.5 kg), SpO2 98 %.  LABORATORY DATA: Lab Results  Component Value Date   WBC 12.9 (H) 09/18/2018   HGB 13.0 09/18/2018   HCT 40.2 09/18/2018   MCV 93.5 09/18/2018   PLT 334 09/18/2018      Chemistry      Component Value Date/Time   NA 141 07/29/2018 0225   K  4.1 07/29/2018 0225   CL 109 07/29/2018 0225   CO2 23 07/29/2018 0225   BUN 20 07/29/2018 0225   CREATININE 1.18 (H) 07/29/2018 0225   CREATININE 1.26 (H) 04/30/2018 1401      Component Value Date/Time   CALCIUM 8.4 (L) 07/29/2018 0225   ALKPHOS 51 07/28/2018 0433   AST 33 07/28/2018 0433   AST 15 04/30/2018 1401   ALT 36 07/28/2018 0433   ALT 20 04/30/2018 1401   BILITOT 1.4 (H) 07/28/2018 0433   BILITOT 1.2 04/30/2018 1401       RADIOGRAPHIC STUDIES: No results found.  ASSESSMENT AND PLAN: This is a very pleasant 78 years old white female recently diagnosed with a stage Ia (T1c, N0, M0) non-small cell lung cancer, adenocarcinoma status post right lower lobectomy with lymph node dissection in July 2020. I had a lengthy discussion with the patient today about her current condition and adjuvant treatment options. I explained to the patient that there is no survival benefit for adjuvant systemic chemotherapy for patient with a stage Ia non-small cell  lung cancer and the current standard of care is close monitoring and observation. I will arrange for the patient to have repeat CT scan of the chest in 6 months for restaging of her disease. She was advised to call immediately if she has any concerning symptoms in the interval. The patient voices understanding of current disease status and treatment options and is in agreement with the current care plan.  All questions were answered. The patient knows to call the clinic with any problems, questions or concerns. We can certainly see the patient much sooner if necessary.  I spent 15 minutes counseling the patient face to face. The total time spent in the appointment was 25 minutes.  Disclaimer: This note was dictated with voice recognition software. Similar sounding words can inadvertently be transcribed and may not be corrected upon review.

## 2018-09-19 ENCOUNTER — Telehealth: Payer: Self-pay | Admitting: Internal Medicine

## 2018-09-19 NOTE — Telephone Encounter (Signed)
Scheduled appt per 8/26 los - mailed letter with appt date and time

## 2018-10-09 ENCOUNTER — Ambulatory Visit (INDEPENDENT_AMBULATORY_CARE_PROVIDER_SITE_OTHER): Payer: Medicare Other | Admitting: Orthopaedic Surgery

## 2018-10-09 ENCOUNTER — Encounter: Payer: Self-pay | Admitting: Orthopaedic Surgery

## 2018-10-09 DIAGNOSIS — M7541 Impingement syndrome of right shoulder: Secondary | ICD-10-CM | POA: Diagnosis not present

## 2018-10-09 MED ORDER — METHYLPREDNISOLONE ACETATE 40 MG/ML IJ SUSP
40.0000 mg | INTRAMUSCULAR | Status: AC | PRN
  Administered 2018-10-09: 40 mg via INTRA_ARTICULAR

## 2018-10-09 MED ORDER — LIDOCAINE HCL 1 % IJ SOLN
3.0000 mL | INTRAMUSCULAR | Status: AC | PRN
  Administered 2018-10-09: 3 mL

## 2018-10-09 NOTE — Progress Notes (Signed)
Office Visit Note   Patient: Vanessa Day           Date of Birth: 1940-05-07           MRN: [id number] Visit Date: 10/09/2018              Requested by: [name], MD [address] [place],  [place] [address] PCP: [name], MD   Assessment & Plan: Visit Diagnoses:  1. Impingement syndrome of right shoulder     Plan: I do feel it is appropriate to provide 1 more injection the subacromial outlet of her right shoulder and she agreed with this and tolerated it well.  Hopefully this will help calm down her symptoms.  I also recommended Voltaren gel.  If she continues to experience discomfort in her right shoulder she will give [place] a call because her neck step would be to order an MRI of her right shoulder to assess the rotator cuff.  All question concerns were answered and addressed.  Follow-up as otherwise as needed.  Follow-Up Instructions: Return if symptoms worsen or fail to improve.   Orders:  Orders Placed This Encounter  Procedures  . Large Joint Inj   No orders of the defined types were placed in this encounter.     Procedures: Large Joint Inj: R subacromial bursa on 10/09/2018 9:24 AM Indications: pain and diagnostic evaluation Details: 22 G 1.5 in needle  Arthrogram: No  Medications: 3 mL lidocaine 1 %; 40 mg methylPREDNISolone acetate 40 MG/ML Outcome: tolerated well, no immediate complications Procedure, treatment alternatives, risks and benefits explained, specific risks discussed. Consent was given by the patient. Immediately prior to procedure a time out was called to verify the correct patient, procedure, equipment, support staff and site/side marked as required. Patient was prepped and draped in the usual sterile fashion.       Clinical Data: No additional findings.   Subjective: Chief Complaint  Patient presents with  . Right Shoulder - Pain  The patient is a very active 78 year old female who is seen before for her right  shoulder.  Is been about a year and a half.  At that time we provided a steroid injection subacromial outlet of the shoulder.  She says that did not really help.  She said physical therapy did not really help either.  It still bothers her with overhead activities and reaching behind her.  She denies any specific injuries.  She did have some type of lung surgery earlier this year.  She denies any neck pain today and denies any numbness and tingling going down her right arm.  HPI  Review of Systems She currently denies any headache, chest pain, shortness of breath, fever, chills, nausea, vomiting  Objective: Vital Signs: There were no vitals taken for this visit.  Physical Exam She is alert oriented x3 and in no acute distress Ortho Exam Examination of her left shoulder is normal exam is her right shoulder is actually normal on exam today other than pain.  She has excellent mobility of the rotator cuff and excellent strength.  She has some positive Neer and Hawkins signs but overall the rotator cuff feels strong. Specialty Comments:  No specialty comments available.  Imaging: No results found.   PMFS History: Patient Active Problem List   Diagnosis Date Noted  . Adenocarcinoma of lung, stage 1, right ([place]) 08/01/2018  . Genetic testing 02/22/2017  . History of breast cancer 02/13/2017  . Family history of breast  cancer   . Acute pain of right knee 07/18/2016  . Trochanteric bursitis, right hip 07/18/2016  . Trochanteric bursitis, left hip 05/04/2016  . 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 Adenocarcinoma of lung, stage 1, right ([place]) 2020  . Anxiety   . Arthritis    back  . Cancer ([place]) 2000   rt breast cancer  . Family history of breast cancer   . GERD (gastroesophageal reflux disease)   . H/O hiatal hernia   . Hyperlipidemia   . Hypertension   . IBS (irritable bowel syndrome)   . Neck pain   . PONV  (postoperative nausea and vomiting)   . Pre-diabetes     Family History  Problem Relation Age of Onset  . Breast cancer Mother        dx over 86  . Prostate cancer Father        dx late 37s-early 70s  . COPD Father   . Cerebral palsy Brother   . Lung cancer Maternal Uncle        smoker  . Stroke Maternal Grandfather   . Cancer Cousin        NOS  . Breast cancer Daughter 35  . Anesthesia problems Neg Hx   . Hypotension Neg Hx   . Malignant hyperthermia Neg Hx   . Pseudochol deficiency Neg Hx     Past Surgical History:  Procedure Laterality Date  . ABDOMINAL HYSTERECTOMY    . APPENDECTOMY    . BACK SURGERY    . BREAST BIOPSY    . BREAST SURGERY    . CARPOMETACARPEL SUSPENSION PLASTY Left 08/25/2015   Procedure: SUSPENSIONPLASTY LEFT THUMB TRAPEZIUM EXCISION ABDUCTOR POLLICIS LONGUS TRANSFER;  Surgeon: Daryll Brod, MD;  Location: [facility];  Service: Orthopedics;  Laterality: Left;  . CHOLECYSTECTOMY    . EYE SURGERY     cataract right and left eye  . GAS INSERTION  06/23/2011   Procedure: INSERTION OF GAS;  Surgeon: Hayden Pedro, MD;  Location: [facility];  Service: Ophthalmology;  Laterality: Left;  SF6  . LOBECTOMY Right 07/26/2018   Procedure: RIGHT LOWER LOBE LOBECTOMY;  Surgeon: Melrose Nakayama, MD;  Location: [facility];  Service: Thoracic;  Laterality: Right;  . MASTECTOMY     right breast  . SCLERAL BUCKLE  06/23/2011   Procedure: SCLERAL BUCKLE;  Surgeon: Hayden Pedro, MD;  Location: [facility];  Service: Ophthalmology;  Laterality: Left;  . SEGMENTECOMY Right 07/26/2018   Procedure: SEGMENTECTOMY;  Surgeon: Melrose Nakayama, MD;  Location: [facility];  Service: Thoracic;  Laterality: Right;  . TONSILLECTOMY    . VIDEO ASSISTED THORACOSCOPY Right 07/26/2018   Procedure: VIDEO ASSISTED THORACOSCOPY;  Surgeon: Melrose Nakayama, MD;  [facility];  Service: Thoracic;  Laterality: Right;   Social History   Occupational History  . Not on file  Tobacco  Use  . Smoking status: Former Smoker    Packs/day: 1.00    Years: 30.00    Pack years: 30.00    Types: Cigarettes    Quit date: 06/22/2002    Years since quitting: 16.3  . Smokeless tobacco: Never Used  Substance and Sexual Activity  . Alcohol use: Yes    Comment: rarely  . Drug use: No  . Sexual activity: Not Currently

## 2018-12-24 ENCOUNTER — Telehealth: Payer: Self-pay | Admitting: Orthopaedic Surgery

## 2018-12-24 NOTE — Telephone Encounter (Signed)
Patient called and left vm.Called patient back LMOM to call us back [phone]

## 2018-12-31 ENCOUNTER — Ambulatory Visit: Payer: Medicare Other | Admitting: Orthopaedic Surgery

## 2018-12-31 ENCOUNTER — Other Ambulatory Visit: Payer: Self-pay

## 2018-12-31 ENCOUNTER — Encounter: Payer: Self-pay | Admitting: Orthopaedic Surgery

## 2018-12-31 DIAGNOSIS — M7541 Impingement syndrome of right shoulder: Secondary | ICD-10-CM | POA: Diagnosis not present

## 2018-12-31 DIAGNOSIS — M25511 Pain in right shoulder: Secondary | ICD-10-CM

## 2018-12-31 MED ORDER — METHYLPREDNISOLONE ACETATE 40 MG/ML IJ SUSP
40.0000 mg | INTRAMUSCULAR | Status: AC | PRN
  Administered 2018-12-31: 40 mg via INTRA_ARTICULAR

## 2018-12-31 MED ORDER — LIDOCAINE HCL 1 % IJ SOLN
3.0000 mL | INTRAMUSCULAR | Status: AC | PRN
  Administered 2018-12-31: 3 mL

## 2018-12-31 NOTE — Progress Notes (Signed)
Office Visit Note   Patient: Vanessa Day           Date of Birth: 03-09-1940           MRN: [id number] Visit Date: 12/31/2018              Requested by: [name], MD [address] [place],  [place] [address] PCP: [name], MD   Assessment & Plan: Visit Diagnoses:  1. Impingement syndrome of right shoulder   2. Acute pain of right shoulder     Plan: Per her wishes I did provide a steroid injection in her right shoulder subacromial space which he tolerated very well.  All question concerns were answered and addressed.  Follow-up can be as needed.  She knows to wait at least until the spring before another injection if needed.  Follow-Up Instructions: Return in about 3 months (around 03/31/2019).   Orders:  Orders Placed This Encounter  Procedures  . Large Joint Inj   No orders of the defined types were placed in this encounter.     Procedures: Large Joint Inj: R subacromial bursa on 12/31/2018 9:43 AM Indications: pain and diagnostic evaluation Details: 22 G 1.5 in needle  Arthrogram: No  Medications: 3 mL lidocaine 1 %; 40 mg methylPREDNISolone acetate 40 MG/ML Outcome: tolerated well, no immediate complications Procedure, treatment alternatives, risks and benefits explained, specific risks discussed. Consent was given by the patient. Immediately prior to procedure a time out was called to verify the correct patient, procedure, equipment, support staff and site/side marked as required. Patient was prepped and draped in the usual sterile fashion.       Clinical Data: No additional findings.   Subjective: Chief Complaint  Patient presents with  . Right Shoulder - Pain  The patient is a very pleasant and active 78 year old female well-known to me.  She has chronic right shoulder pain.  She has had at least 2 steroid injections in that right shoulder.  She did have surgery in July which was lung surgery.  She is not interested in any other  type of surgery.  She has been working on [employer] farm [employer] and that has caused an increase in her shoulder pain.  She would like to have a steroid injection today.  She has had no other acute change in her medical status.  She is not a diabetic.  She has tolerated steroid injections before and understands fully the risk and benefits of steroid injections.  HPI  Review of Systems She currently denies any headache, chest pain, shortness of breath, fever, chills, nausea, vomiting  Objective: Vital Signs: There were no vitals taken for this visit.  Physical Exam She is alert and orient x3 and in no acute distress Ortho Exam Examination of her right shoulder shows that the still moves fluidly and smoothly it is just painful with overhead activities and reaching behind her.  There is only slight weakness in the cuff itself. Specialty Comments:  No specialty comments available.  Imaging: No results found.   PMFS History: Patient Active Problem List   Diagnosis Date Noted  . Adenocarcinoma of lung, stage 1, right ([facility]) 08/01/2018  . Genetic testing 02/22/2017  . History of breast cancer 02/13/2017  . Family history of breast cancer   . Acute pain of right knee 07/18/2016  . Trochanteric bursitis, right hip 07/18/2016  . Trochanteric bursitis, left hip 05/04/2016  . 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 Adenocarcinoma of lung, stage 1, right ([facility]) 2020  . Anxiety   . Arthritis    back  . Cancer ([facility]) 2000   rt breast cancer  . Family history of breast cancer   . GERD (gastroesophageal reflux disease)   . H/O hiatal hernia   . Hyperlipidemia   . Hypertension   . IBS (irritable bowel syndrome)   . Neck pain   . PONV (postoperative nausea and vomiting)   . Pre-diabetes     Family History  Problem Relation Age of Onset  . Breast cancer Mother        dx over 61  . Prostate cancer Father        dx late  35s-early 70s  . COPD Father   . Cerebral palsy Brother   . Lung cancer Maternal Uncle        smoker  . Stroke Maternal Grandfather   . Cancer Cousin        NOS  . Breast cancer Daughter 41  . Anesthesia problems Neg Hx   . Hypotension Neg Hx   . Malignant hyperthermia Neg Hx   . Pseudochol deficiency Neg Hx     Past Surgical History:  Procedure Laterality Date  . ABDOMINAL HYSTERECTOMY    . APPENDECTOMY    . BACK SURGERY    . BREAST BIOPSY    . BREAST SURGERY    . CARPOMETACARPEL SUSPENSION PLASTY Left 08/25/2015   Procedure: SUSPENSIONPLASTY LEFT THUMB TRAPEZIUM EXCISION ABDUCTOR POLLICIS LONGUS TRANSFER;  Surgeon: Daryll Brod, MD;  Location: [facility];  Service: Orthopedics;  Laterality: Left;  . CHOLECYSTECTOMY    . EYE SURGERY     cataract right and left eye  . GAS INSERTION  06/23/2011   Procedure: INSERTION OF GAS;  Surgeon: Hayden Pedro, MD;  Location: [facility];  Service: Ophthalmology;  Laterality: Left;  SF6  . LOBECTOMY Right 07/26/2018   Procedure: RIGHT LOWER LOBE LOBECTOMY;  Surgeon: Melrose Nakayama, MD;  Location: [facility];  Service: Thoracic;  Laterality: Right;  . MASTECTOMY     right breast  . SCLERAL BUCKLE  06/23/2011   Procedure: SCLERAL BUCKLE;  Surgeon: Hayden Pedro, MD;  Location: [facility];  Service: Ophthalmology;  Laterality: Left;  . SEGMENTECOMY Right 07/26/2018   Procedure: SEGMENTECTOMY;  Surgeon: Melrose Nakayama, MD;  Location: [facility];  Service: Thoracic;  Laterality: Right;  . TONSILLECTOMY    . VIDEO ASSISTED THORACOSCOPY Right 07/26/2018   Procedure: VIDEO ASSISTED THORACOSCOPY;  Surgeon: Melrose Nakayama, MD;  [facility]: [facility];  Service: Thoracic;  Laterality: Right;   Social History   Occupational History  . Not on file  Tobacco Use  . Smoking status: Former Smoker    Packs/day: 1.00    Years: 30.00    Pack years: 30.00    Types: Cigarettes    Quit date: 06/22/2002    Years since quitting: 16.5  . Smokeless  tobacco: Never Used  Substance and Sexual Activity  . Alcohol use: Yes    Comment: rarely  . Drug use: No  . Sexual activity: Not Currently

## 2019-01-02 ENCOUNTER — Encounter: Payer: Self-pay | Admitting: *Deleted

## 2019-01-15 ENCOUNTER — Encounter: Payer: Self-pay | Admitting: *Deleted

## 2019-03-05 ENCOUNTER — Ambulatory Visit: Payer: Medicare Other | Admitting: Thoracic Surgery (Cardiothoracic Vascular Surgery)

## 2019-03-19 ENCOUNTER — Inpatient Hospital Stay: Payer: Medicare PPO | Attending: Internal Medicine

## 2019-03-19 ENCOUNTER — Ambulatory Visit (HOSPITAL_COMMUNITY)
Admission: RE | Admit: 2019-03-19 | Discharge: 2019-03-19 | Disposition: A | Discharge status: Home / Self Care | Payer: Medicare PPO | Source: Ambulatory Visit | Attending: Internal Medicine | Admitting: Internal Medicine

## 2019-03-19 ENCOUNTER — Other Ambulatory Visit: Payer: Self-pay

## 2019-03-19 ENCOUNTER — Encounter (HOSPITAL_COMMUNITY): Payer: Self-pay

## 2019-03-19 DIAGNOSIS — Z803 Family history of malignant neoplasm of breast: Secondary | ICD-10-CM | POA: Insufficient documentation

## 2019-03-19 DIAGNOSIS — C3490 Malignant neoplasm of unspecified part of unspecified bronchus or lung: Secondary | ICD-10-CM

## 2019-03-19 DIAGNOSIS — Z791 Long term (current) use of non-steroidal anti-inflammatories (NSAID): Secondary | ICD-10-CM | POA: Diagnosis not present

## 2019-03-19 DIAGNOSIS — Z90710 Acquired absence of both cervix and uterus: Secondary | ICD-10-CM | POA: Diagnosis not present

## 2019-03-19 DIAGNOSIS — I10 Essential (primary) hypertension: Secondary | ICD-10-CM | POA: Insufficient documentation

## 2019-03-19 DIAGNOSIS — Z902 Acquired absence of lung [part of]: Secondary | ICD-10-CM | POA: Insufficient documentation

## 2019-03-19 DIAGNOSIS — Z9011 Acquired absence of right breast and nipple: Secondary | ICD-10-CM | POA: Insufficient documentation

## 2019-03-19 DIAGNOSIS — F419 Anxiety disorder, unspecified: Secondary | ICD-10-CM | POA: Insufficient documentation

## 2019-03-19 DIAGNOSIS — R0602 Shortness of breath: Secondary | ICD-10-CM | POA: Insufficient documentation

## 2019-03-19 DIAGNOSIS — Z79899 Other long term (current) drug therapy: Secondary | ICD-10-CM | POA: Diagnosis not present

## 2019-03-19 DIAGNOSIS — C3431 Malignant neoplasm of lower lobe, right bronchus or lung: Secondary | ICD-10-CM | POA: Insufficient documentation

## 2019-03-19 DIAGNOSIS — Z853 Personal history of malignant neoplasm of breast: Secondary | ICD-10-CM | POA: Diagnosis not present

## 2019-03-19 DIAGNOSIS — Z7982 Long term (current) use of aspirin: Secondary | ICD-10-CM | POA: Diagnosis not present

## 2019-03-19 DIAGNOSIS — Z7952 Long term (current) use of systemic steroids: Secondary | ICD-10-CM | POA: Diagnosis not present

## 2019-03-19 LAB — CBC WITH DIFFERENTIAL (CANCER CENTER ONLY)
Abs Immature Granulocytes: 0.05 10*3/uL (ref 0.00–0.07)
Basophils Absolute: 0 10*3/uL (ref 0.0–0.1)
Basophils Relative: 0 %
Eosinophils Absolute: 0.2 10*3/uL (ref 0.0–0.5)
Eosinophils Relative: 1 %
HCT: 40.9 % (ref 36.0–46.0)
Hemoglobin: 13.3 g/dL (ref 12.0–15.0)
Immature Granulocytes: 0 %
Lymphocytes Relative: 13 %
Lymphs Abs: 2 10*3/uL (ref 0.7–4.0)
MCH: 31.3 pg (ref 26.0–34.0)
MCHC: 32.5 g/dL (ref 30.0–36.0)
MCV: 96.2 fL (ref 80.0–100.0)
Monocytes Absolute: 0.6 10*3/uL (ref 0.1–1.0)
Monocytes Relative: 4 %
Neutro Abs: 11.9 10*3/uL — ABNORMAL HIGH (ref 1.7–7.7)
Neutrophils Relative %: 82 %
Platelet Count: 264 10*3/uL (ref 150–400)
RBC: 4.25 MIL/uL (ref 3.87–5.11)
RDW: 13.2 % (ref 11.5–15.5)
WBC Count: 14.7 10*3/uL — ABNORMAL HIGH (ref 4.0–10.5)
nRBC: 0 % (ref 0.0–0.2)

## 2019-03-19 LAB — CMP (CANCER CENTER ONLY)
ALT: 31 U/L (ref 0–44)
AST: 20 U/L (ref 15–41)
Albumin: 3.8 g/dL (ref 3.5–5.0)
Alkaline Phosphatase: 80 U/L (ref 38–126)
Anion gap: 9 (ref 5–15)
BUN: 34 mg/dL — ABNORMAL HIGH (ref 8–23)
CO2: 27 mmol/L (ref 22–32)
Calcium: 8.7 mg/dL — ABNORMAL LOW (ref 8.9–10.3)
Chloride: 105 mmol/L (ref 98–111)
Creatinine: 1.3 mg/dL — ABNORMAL HIGH (ref 0.44–1.00)
GFR, Est AFR Am: 45 mL/min — ABNORMAL LOW (ref >60)
GFR, Estimated: 39 mL/min — ABNORMAL LOW (ref >60)
Glucose, Bld: 169 mg/dL — ABNORMAL HIGH (ref 70–99)
Potassium: 3.9 mmol/L (ref 3.5–5.1)
Sodium: 141 mmol/L (ref 135–145)
Total Bilirubin: 1 mg/dL (ref 0.3–1.2)
Total Protein: 7.1 g/dL (ref 6.5–8.1)

## 2019-03-19 MED ORDER — SODIUM CHLORIDE (PF) 0.9 % IJ SOLN
INTRAMUSCULAR | Status: AC
  Filled 2019-03-19: qty 50

## 2019-03-19 MED ORDER — IOHEXOL 300 MG/ML  SOLN
60.0000 mL | Freq: Once | INTRAMUSCULAR | Status: AC | PRN
  Administered 2019-03-19: 60 mL via INTRAVENOUS

## 2019-03-21 ENCOUNTER — Other Ambulatory Visit: Payer: Self-pay

## 2019-03-21 ENCOUNTER — Encounter: Payer: Self-pay | Admitting: Internal Medicine

## 2019-03-21 ENCOUNTER — Inpatient Hospital Stay: Payer: Medicare PPO | Admitting: Internal Medicine

## 2019-03-21 VITALS — BP 121/73 | HR 69 | Temp 98.5°F | Resp 17 | Ht 63.0 in | Wt 155.0 lb

## 2019-03-21 DIAGNOSIS — C3491 Malignant neoplasm of unspecified part of right bronchus or lung: Secondary | ICD-10-CM

## 2019-03-21 DIAGNOSIS — C3431 Malignant neoplasm of lower lobe, right bronchus or lung: Secondary | ICD-10-CM | POA: Diagnosis not present

## 2019-03-21 DIAGNOSIS — Z902 Acquired absence of lung [part of]: Secondary | ICD-10-CM | POA: Diagnosis not present

## 2019-03-21 DIAGNOSIS — C3490 Malignant neoplasm of unspecified part of unspecified bronchus or lung: Secondary | ICD-10-CM | POA: Diagnosis not present

## 2019-03-21 DIAGNOSIS — Z85118 Personal history of other malignant neoplasm of bronchus and lung: Secondary | ICD-10-CM

## 2019-03-21 NOTE — Progress Notes (Signed)
[facility] Telephone:([phone]   Fax:([phone]  OFFICE PROGRESS NOTE  [name], MD [address] [place] [address]  DIAGNOSIS: T1a (T1c, N0, M0) non-small cell lung cancer, adenocarcinoma presented with right lower lobe lung nodule in June 2020.  PRIOR THERAPY: Right video-assisted thoracoscopy, Wedge resection right lower lobe nodule, Thoracoscopic right lower lobectomy, Lymph node dissection on July 26, 2018 under the care of Dr. Roxan Hockey  CURRENT THERAPY: Observation.  INTERVAL HISTORY: Vanessa Day 79 y.o. female returns to the clinic today for 6 months follow-up visit.  The patient is feeling fine today with no concerning complaints except for occasional shortness of breath with exertion.  She denied having any chest pain, cough or hemoptysis.  She denied having any fever or chills.  She has no nausea, vomiting, diarrhea or constipation.  She has no headache or visual changes.  She had repeat CT scan of the chest performed recently and she is here for evaluation and discussion of her risk her results.  MEDICAL HISTORY: Past Medical History:  Diagnosis Date  . Adenocarcinoma of lung, stage 1, right ([facility]) 2020  . Anxiety   . Arthritis    back  . Cancer ([facility]) 2000   rt breast cancer  . Family history of breast cancer   . GERD (gastroesophageal reflux disease)   . H/O hiatal hernia   . Hyperlipidemia   . Hypertension   . IBS (irritable bowel syndrome)   . Neck pain   . PONV (postoperative nausea and vomiting)   . Pre-diabetes     ALLERGIES:  is allergic to hydrocodone; codeine; statins; and sulfa antibiotics.  MEDICATIONS:  Current Outpatient Medications  Medication Sig Dispense Refill  . acetaminophen (TYLENOL) 500 MG tablet Take 1 tablet (500 mg total) by mouth every 6 (six) hours as needed for mild pain. 30 tablet 0  . alosetron (LOTRONEX) 0.5 MG tablet Take 0.25 mg by mouth daily.    Marland Kitchen aspirin 81 MG tablet  Take 81 mg by mouth daily.    . celecoxib (CELEBREX) 200 MG capsule Take 200 mg by mouth 2 (two) times daily.    . cholecalciferol (VITAMIN D3) 25 MCG (1000 UT) tablet Take 1,000 Units by mouth daily.    Marland Kitchen ezetimibe (ZETIA) 10 MG tablet Take 10 mg by mouth daily.    Marland Kitchen guaiFENesin (MUCINEX) 600 MG 12 hr tablet Take 1 tablet (600 mg total) by mouth 2 (two) times daily as needed for cough or to loosen phlegm.    . hydrochlorothiazide (HYDRODIURIL) 25 MG tablet Take 25 mg by mouth daily.    Marland Kitchen loratadine (CLARITIN) 10 MG tablet Take 10 mg by mouth daily.    Marland Kitchen LORazepam (ATIVAN) 1 MG tablet Take 1-2 mg by mouth at bedtime.     Marland Kitchen losartan (COZAAR) 100 MG tablet Take 100 mg by mouth daily.    Marnee Spring Omega-3 Krill Oil 500 MG CAPS Take 500 mg by mouth daily.    Marland Kitchen oxymetazoline (AFRIN) 0.05 % nasal spray Place 1 spray into both nostrils at bedtime as needed for congestion.    . phenol (CHLORASEPTIC) 1.4 % LIQD Use as directed 1 spray in the mouth or throat as needed for throat irritation / pain.  0  . Polyethyl Glycol-Propyl Glycol (SYSTANE OP) Place 1 drop into both eyes 2 (two) times daily as needed (dry eyes).    . predniSONE (DELTASONE) 10 MG tablet     .  pregabalin (LYRICA) 50 MG capsule Take 50 mg by mouth 2 (two) times daily.     . traMADol (ULTRAM) 50 MG tablet Take 50 mg by mouth every 6 (six) hours as needed for moderate pain.    . vitamin E 1000 UNIT capsule Take 1 tablet by mouth daily.     No current facility-administered medications for this visit.    SURGICAL HISTORY:  Past Surgical History:  Procedure Laterality Date  . ABDOMINAL HYSTERECTOMY    . APPENDECTOMY    . BACK SURGERY    . BREAST BIOPSY    . BREAST SURGERY    . CARPOMETACARPEL SUSPENSION PLASTY Left 08/25/2015   Procedure: SUSPENSIONPLASTY LEFT THUMB TRAPEZIUM EXCISION ABDUCTOR POLLICIS LONGUS TRANSFER;  Surgeon: Daryll Brod, MD;  Location: [facility];  Service: Orthopedics;  Laterality: Left;  .  CHOLECYSTECTOMY    . EYE SURGERY     cataract right and left eye  . GAS INSERTION  06/23/2011   Procedure: INSERTION OF GAS;  Surgeon: Hayden Pedro, MD;  Location: [facility];  Service: Ophthalmology;  Laterality: Left;  SF6  . LOBECTOMY Right 07/26/2018   Procedure: RIGHT LOWER LOBE LOBECTOMY;  Surgeon: Melrose Nakayama, MD;  Location: [facility];  Service: Thoracic;  Laterality: Right;  . MASTECTOMY     right breast  . SCLERAL BUCKLE  06/23/2011   Procedure: SCLERAL BUCKLE;  Surgeon: Hayden Pedro, MD;  Location: [facility];  Service: Ophthalmology;  Laterality: Left;  . SEGMENTECOMY Right 07/26/2018   Procedure: SEGMENTECTOMY;  Surgeon: Melrose Nakayama, MD;  Location: [facility];  Service: Thoracic;  Laterality: Right;  . TONSILLECTOMY    . VIDEO ASSISTED THORACOSCOPY Right 07/26/2018   Procedure: VIDEO ASSISTED THORACOSCOPY;  Surgeon: Melrose Nakayama, MD;  Location: [facility];  Service: Thoracic;  Laterality: Right;    REVIEW OF SYSTEMS:  A comprehensive review of systems was negative except for: Respiratory: positive for dyspnea on exertion   PHYSICAL EXAMINATION: General appearance: alert, cooperative and no distress Head: Normocephalic, without obvious abnormality, atraumatic Neck: no adenopathy, no JVD, supple, symmetrical, trachea midline and thyroid not enlarged, symmetric, no tenderness/mass/nodules Lymph nodes: Cervical, supraclavicular, and axillary nodes normal. Resp: clear to auscultation bilaterally Back: symmetric, no curvature. ROM normal. No CVA tenderness. Cardio: regular rate and rhythm, S1, S2 normal, no murmur, click, rub or gallop GI: soft, non-tender; bowel sounds normal; no masses,  no organomegaly Extremities: extremities normal, atraumatic, no cyanosis or edema  ECOG PERFORMANCE STATUS: 0 - Asymptomatic  Blood pressure 121/73, pulse 69, temperature 98.5 F (36.9 C), temperature source Oral, resp. rate 17, height 5\' 3"  (1.6 m), weight 155 lb (70.3 kg), SpO2 98  %.  LABORATORY DATA: Lab Results  Component Value Date   WBC 14.7 (H) 03/19/2019   HGB 13.3 03/19/2019   HCT 40.9 03/19/2019   MCV 96.2 03/19/2019   PLT 264 03/19/2019      Chemistry      Component Value Date/Time   NA 141 03/19/2019 0936   K 3.9 03/19/2019 0936   CL 105 03/19/2019 0936   CO2 27 03/19/2019 0936   BUN 34 (H) 03/19/2019 0936   CREATININE 1.30 (H) 03/19/2019 0936      Component Value Date/Time   CALCIUM 8.7 (L) 03/19/2019 0936   ALKPHOS 80 03/19/2019 0936   AST 20 03/19/2019 0936   ALT 31 03/19/2019 0936   BILITOT 1.0 03/19/2019 0936       RADIOGRAPHIC STUDIES: CT Chest W Contrast  Result Date: 03/19/2019 CLINICAL DATA:  Non-small cell lung cancer staging. History of right breast cancer with prior mastectomy and axillary dissection. EXAM: CT CHEST WITH CONTRAST TECHNIQUE: Multidetector CT imaging of the chest was performed during intravenous contrast administration. CONTRAST:  66mL OMNIPAQUE IOHEXOL 300 MG/ML  SOLN COMPARISON:  04/11/2018 FINDINGS: Cardiovascular: Signs of calcified atherosclerotic change throughout the thoracic aorta. No signs of aneurysm in the chest. Central pulmonary vessels are normal aside from distortion of right hilum in the setting of previous partial lung resection in the right chest. Coronary artery disease as before. No pericardial effusion. Mediastinum/Nodes: No thoracic inlet or axillary lymphadenopathy. Post right axillary dissection in the setting of right mastectomy. Fluid density areas with well-circumscribed margins unchanged in the mediastinum (image 73, series 2) largest area measuring approximately 1 cm in greatest AP dimension. No signs of mediastinal adenopathy. Lungs/Pleura: Scarring related to right lower lobectomy. Small right pleural effusion and/or pleural thickening along the medial right chest measures fluid or less than fluid density and approximately 1 cm greatest thickness (image 73, series 7) Subtle nodularity along  the pleural surface may represent a combination of atelectasis and postoperative changes blending in the scarring in the apex of the right upper lobe best seen on images 25 through 45 in the posterior right chest. Calcified granuloma in the peripheral right chest. Airways are patent. Left lung is clear. Upper Abdomen: Hepatic steatosis with lobular hepatic contours similar to the prior exam. Liver is incompletely imaged without focal lesion in imaged portions. Adrenal glands are normal. No acute findings in the upper abdomen. Musculoskeletal: No signs of acute bone process or destructive bone lesion. IMPRESSION: 1. Postoperative changes in the right chest of right lower lobectomy with small amount of pleural fluid and/or thickening along the medial right chest, this will serve as a baseline for future evaluations. 2. Subtle dependent nodularity in the dependent right chest is of uncertain significance given recent surgery, short interval follow-up may be helpful at a 3 month interval for further assessment. 3. Small areas of fluid density in the subcarinal region are unchanged and may represent small duplication cysts or pericardial cysts, attention on follow-up. 4. Hepatic steatosis with lobular hepatic contours similar to the prior exam. Correlate with any clinical or laboratory evidence of disease. Aortic Atherosclerosis (ICD10-I70.0). Electronically Signed   By: Zetta Bills M.D.   On: 03/19/2019 16:16    ASSESSMENT AND PLAN: This is a very pleasant 79 years old white female recently diagnosed with a stage Ia (T1c, N0, M0) non-small cell lung cancer, adenocarcinoma status post right lower lobectomy with lymph node dissection in July 2020. The patient is currently on observation and she is feeling fine today with no concerning complaints except seasonal allergy. She had repeat CT scan of the chest performed recently.  I personally and independently reviewed the scans and discussed the results with the  patient today. Her scan showed no clear evidence for disease recurrence. I recommended for her to continue on observation with repeat CT scan of the chest in 6 months. She was advised to call immediately if she has any concerning symptoms in the interval. The patient voices understanding of current disease status and treatment options and is in agreement with the current care plan. All questions were answered. The patient knows to call the clinic with any problems, questions or concerns. We can certainly see the patient much sooner if necessary.  Disclaimer: This note was dictated with voice recognition software. Similar sounding words can inadvertently  be transcribed and may not be corrected upon review.

## 2019-03-22 ENCOUNTER — Telehealth: Payer: Self-pay | Admitting: Internal Medicine

## 2019-03-22 NOTE — Telephone Encounter (Signed)
Scheduled per los. Called and spoke with patient briefly, call was disconneted. Called back, no answer. Mailed printout

## 2019-03-25 ENCOUNTER — Other Ambulatory Visit: Payer: Self-pay | Admitting: Thoracic Surgery (Cardiothoracic Vascular Surgery)

## 2019-03-25 DIAGNOSIS — C3491 Malignant neoplasm of unspecified part of right bronchus or lung: Secondary | ICD-10-CM

## 2019-03-26 ENCOUNTER — Ambulatory Visit: Payer: Medicare PPO | Admitting: Thoracic Surgery (Cardiothoracic Vascular Surgery)

## 2019-03-26 ENCOUNTER — Other Ambulatory Visit: Payer: Self-pay

## 2019-03-26 ENCOUNTER — Encounter: Payer: Self-pay | Admitting: Thoracic Surgery (Cardiothoracic Vascular Surgery)

## 2019-03-26 VITALS — BP 117/68 | HR 66 | Temp 97.3°F | Resp 16 | Ht 63.0 in | Wt 155.0 lb

## 2019-03-26 DIAGNOSIS — C3431 Malignant neoplasm of lower lobe, right bronchus or lung: Secondary | ICD-10-CM | POA: Diagnosis not present

## 2019-03-26 DIAGNOSIS — Z902 Acquired absence of lung [part of]: Secondary | ICD-10-CM | POA: Diagnosis not present

## 2019-03-26 NOTE — Progress Notes (Signed)
[address]       [place] [address]             [phone]     HPI: Vanessa Day returns for a scheduled follow-up visit  Vanessa Day is a 79 year old wound with a remote history of smoking, having quit in 2004.  Her past medical history is also significant for breast cancer, hypertension, hyperlipidemia, reflux, irritable bowel syndrome, arthritis, and peripheral neuropathy.  She had a thoracoscopic right lower lobectomy in July 2020 for a T1, N0, stage Ia adenocarcinoma.  Postoperatively she had hoarseness and some brachial plexopathy symptoms in her left hand.  She is feeling well.  She does not have any incisional pain.  Her sensation has returned to normal in her left hand.  She is not having any problems with hoarseness.  Overall she feels well.  Her appetite is good.  Past Medical History:  Diagnosis Date  . Adenocarcinoma of lung, stage 1, right ([facility]) 2020  . Anxiety   . Arthritis    back  . Cancer ([facility]) 2000   rt breast cancer  . Family history of breast cancer   . GERD (gastroesophageal reflux disease)   . H/O hiatal hernia   . Hyperlipidemia   . Hypertension   . IBS (irritable bowel syndrome)   . Neck pain   . PONV (postoperative nausea and vomiting)   . Pre-diabetes     Current Outpatient Medications  Medication Sig Dispense Refill  . acetaminophen (TYLENOL) 500 MG tablet Take 1 tablet (500 mg total) by mouth every 6 (six) hours as needed for mild pain. 30 tablet 0  . aspirin 81 MG tablet Take 81 mg by mouth daily.    . celecoxib (CELEBREX) 200 MG capsule Take 200 mg by mouth 2 (two) times daily.    . cholecalciferol (VITAMIN D3) 25 MCG (1000 UT) tablet Take 1,000 Units by mouth daily.    Marland Kitchen ezetimibe (ZETIA) 10 MG tablet Take 10 mg by mouth daily.    Marland Kitchen guaiFENesin (MUCINEX) 600 MG 12 hr tablet Take 1 tablet (600 mg total) by mouth 2 (two) times daily as needed for cough or to loosen phlegm.    . hydrochlorothiazide (HYDRODIURIL) 25 MG  tablet Take 25 mg by mouth daily.    Marland Kitchen loratadine (CLARITIN) 10 MG tablet Take 10 mg by mouth daily.    Marland Kitchen LORazepam (ATIVAN) 1 MG tablet Take 1-2 mg by mouth at bedtime.     Marland Kitchen losartan (COZAAR) 100 MG tablet Take 100 mg by mouth daily.    Marnee Spring Omega-3 Krill Oil 500 MG CAPS Take 500 mg by mouth daily.    Marland Kitchen oxymetazoline (AFRIN) 0.05 % nasal spray Place 1 spray into both nostrils at bedtime as needed for congestion.    . phenol (CHLORASEPTIC) 1.4 % LIQD Use as directed 1 spray in the mouth or throat as needed for throat irritation / pain.  0  . Polyethyl Glycol-Propyl Glycol (SYSTANE OP) Place 1 drop into both eyes 2 (two) times daily as needed (dry eyes).    . pregabalin (LYRICA) 50 MG capsule Take 50 mg by mouth 2 (two) times daily.     . vitamin E 1000 UNIT capsule Take 1 tablet by mouth daily.    Marland Kitchen alosetron (LOTRONEX) 0.5 MG tablet Take 0.25 mg by mouth daily.    . predniSONE (DELTASONE) 10 MG tablet      No current facility-administered medications for this visit.  Physical Exam BP 117/68 (BP Location: Right Arm, Patient Position: Sitting, Cuff Size: Normal)   Pulse 66   Temp (!) 97.3 F (36.3 C)   Resp 16   Ht 5\' 3"  (1.6 m)   Wt 155 lb (70.3 kg)   SpO2 97% Comment: RA  BMI 27.49 kg/m  79 year old woman in no acute distress Alert and oriented x3 with no focal deficits No cervical or supraclavicular adenopathy Cardiac regular rate and rhythm Lungs diminished at right base, otherwise clear  Diagnostic Tests: CT CHEST WITH CONTRAST  TECHNIQUE: Multidetector CT imaging of the chest was performed during intravenous contrast administration.  CONTRAST:  36mL OMNIPAQUE IOHEXOL 300 MG/ML  SOLN  COMPARISON:  04/11/2018  FINDINGS: Cardiovascular: Signs of calcified atherosclerotic change throughout the thoracic aorta. No signs of aneurysm in the chest. Central pulmonary vessels are normal aside from distortion of right hilum in the setting of previous partial lung  resection in the right chest. Coronary artery disease as before. No pericardial effusion.  Mediastinum/Nodes: No thoracic inlet or axillary lymphadenopathy. Post right axillary dissection in the setting of right mastectomy.  Fluid density areas with well-circumscribed margins unchanged in the mediastinum (image 73, series 2) largest area measuring approximately 1 cm in greatest AP dimension. No signs of mediastinal adenopathy.  Lungs/Pleura: Scarring related to right lower lobectomy. Small right pleural effusion and/or pleural thickening along the medial right chest measures fluid or less than fluid density and approximately 1 cm greatest thickness (image 73, series 7)  Subtle nodularity along the pleural surface may represent a combination of atelectasis and postoperative changes blending in the scarring in the apex of the right upper lobe best seen on images 25 through 45 in the posterior right chest.  Calcified granuloma in the peripheral right chest.  Airways are patent. Left lung is clear.  Upper Abdomen: Hepatic steatosis with lobular hepatic contours similar to the prior exam. Liver is incompletely imaged without focal lesion in imaged portions. Adrenal glands are normal. No acute findings in the upper abdomen.  Musculoskeletal: No signs of acute bone process or destructive bone lesion.  IMPRESSION: 1. Postoperative changes in the right chest of right lower lobectomy with small amount of pleural fluid and/or thickening along the medial right chest, this will serve as a baseline for future evaluations. 2. Subtle dependent nodularity in the dependent right chest is of uncertain significance given recent surgery, short interval follow-up may be helpful at a 3 month interval for further assessment. 3. Small areas of fluid density in the subcarinal region are unchanged and may represent small duplication cysts or pericardial cysts, attention on follow-up. 4.  Hepatic steatosis with lobular hepatic contours similar to the prior exam. Correlate with any clinical or laboratory evidence of disease.  Aortic Atherosclerosis (ICD10-I70.0).   Electronically Signed   By: Zetta Bills M.D.   On: 03/19/2019 16:16 I personally reviewed the CT images and concur with the findings noted above.  The small areas in the subcarinal space are consistent with previous lymph node harvest and not duplication or pericardial cyst.  Impression: Vanessa Day is a 79 year old former smoker with a history of hypertension, hyperlipidemia, breast cancer, peripheral neuropathy, arthritis, reflux, and IBS.  She had a thoracoscopic right lower lobectomy for a stage Ia adenocarcinoma in July 2020.     T1, N0, stage Ia adenocarcinoma -status post right lower lobectomy.  She is now 6 months out with no evidence of recurrent disease.  Continue follow-up per protocol with Dr. Julien Nordmann.  Tobacco use-quit in 2004  Postoperative brachial plexopathy-resolved  Postoperative hoarseness-resolved  Plan: Follow-up as scheduled with Dr. Julien Nordmann I will be happy to see Mrs. Blanford back anytime in the future if I can be of any further assistance with her care.  Melrose Nakayama, MD [facility] [phone]

## 2019-04-02 ENCOUNTER — Other Ambulatory Visit: Payer: Self-pay | Admitting: Family Medicine

## 2019-04-02 DIAGNOSIS — Z1231 Encounter for screening mammogram for malignant neoplasm of breast: Secondary | ICD-10-CM

## 2019-04-29 ENCOUNTER — Ambulatory Visit
Admission: RE | Admit: 2019-04-29 | Discharge: 2019-04-29 | Disposition: A | Discharge status: Home / Self Care | Payer: Medicare PPO | Source: Ambulatory Visit | Attending: Family Medicine | Admitting: Family Medicine

## 2019-04-29 ENCOUNTER — Other Ambulatory Visit: Payer: Self-pay

## 2019-04-29 DIAGNOSIS — Z1231 Encounter for screening mammogram for malignant neoplasm of breast: Secondary | ICD-10-CM

## 2019-04-29 DIAGNOSIS — M8580 Other specified disorders of bone density and structure, unspecified site: Secondary | ICD-10-CM

## 2019-06-12 ENCOUNTER — Encounter: Payer: Self-pay | Admitting: *Deleted

## 2019-07-10 DIAGNOSIS — M19072 Primary osteoarthritis, left ankle and foot: Secondary | ICD-10-CM | POA: Diagnosis not present

## 2019-07-10 DIAGNOSIS — M7661 Achilles tendinitis, right leg: Secondary | ICD-10-CM | POA: Diagnosis not present

## 2019-07-10 DIAGNOSIS — M19071 Primary osteoarthritis, right ankle and foot: Secondary | ICD-10-CM | POA: Diagnosis not present

## 2019-07-10 DIAGNOSIS — M7662 Achilles tendinitis, left leg: Secondary | ICD-10-CM | POA: Diagnosis not present

## 2019-08-07 DIAGNOSIS — M48061 Spinal stenosis, lumbar region without neurogenic claudication: Secondary | ICD-10-CM | POA: Diagnosis not present

## 2019-08-07 DIAGNOSIS — M25571 Pain in right ankle and joints of right foot: Secondary | ICD-10-CM | POA: Diagnosis not present

## 2019-08-07 DIAGNOSIS — M25572 Pain in left ankle and joints of left foot: Secondary | ICD-10-CM | POA: Diagnosis not present

## 2019-09-09 ENCOUNTER — Ambulatory Visit: Payer: Medicare PPO | Admitting: Orthopaedic Surgery

## 2019-09-09 ENCOUNTER — Ambulatory Visit (INDEPENDENT_AMBULATORY_CARE_PROVIDER_SITE_OTHER): Payer: Medicare PPO

## 2019-09-09 ENCOUNTER — Encounter: Payer: Self-pay | Admitting: Orthopaedic Surgery

## 2019-09-09 ENCOUNTER — Ambulatory Visit: Payer: Self-pay

## 2019-09-09 DIAGNOSIS — M7541 Impingement syndrome of right shoulder: Secondary | ICD-10-CM | POA: Diagnosis not present

## 2019-09-09 DIAGNOSIS — M79672 Pain in left foot: Secondary | ICD-10-CM | POA: Diagnosis not present

## 2019-09-09 DIAGNOSIS — M79671 Pain in right foot: Secondary | ICD-10-CM

## 2019-09-09 MED ORDER — LIDOCAINE HCL 1 % IJ SOLN
3.0000 mL | INTRAMUSCULAR | Status: AC | PRN
  Administered 2019-09-09: 3 mL

## 2019-09-09 MED ORDER — METHYLPREDNISOLONE ACETATE 40 MG/ML IJ SUSP
40.0000 mg | INTRAMUSCULAR | Status: AC | PRN
  Administered 2019-09-09: 40 mg via INTRA_ARTICULAR

## 2019-09-09 NOTE — Progress Notes (Signed)
Office Visit Note   Patient: MAXI CARRERAS           Date of Birth: 24-Feb-1940           MRN: [id number] Visit Date: 09/09/2019              Requested by: [name], MD [address] [place],  [place] [address] PCP: [name], MD   Assessment & Plan: Visit Diagnoses:  1. Pain in left foot   2. Pain in right foot   3. Impingement syndrome of right shoulder     Plan: I spoke to the patient the importance of not you wearing sandals or open back shoes and getting back to regular tennis shoes that fit firmly into where there is at least for a month.  I would like to send her to outpatient physical therapy to see if there is any modalities that they can do to help calm down her left Achilles tendinitis as well as her bilateral foot pain.  I am not sure if inserts would help her at all but certainly as a possibility.  I would also like to send her for ABIs of her bilateral lower extremities for vascular studies.  All question concerns were answered or addressed.  I did place a steroid injection in her right shoulder.  I would like to see her back in 4 weeks.  Follow-Up Instructions: Return in about 4 weeks (around 10/07/2019).   Orders:  Orders Placed This Encounter  Procedures  . Large Joint Inj  . XR Foot Complete Left  . XR Foot Complete Right   No orders of the defined types were placed in this encounter.     Procedures: Large Joint Inj: R subacromial bursa on 09/09/2019 10:08 AM Indications: pain and diagnostic evaluation Details: 22 G 1.5 in needle  Arthrogram: No  Medications: 3 mL lidocaine 1 %; 40 mg methylPREDNISolone acetate 40 MG/ML Outcome: tolerated well, no immediate complications Procedure, treatment alternatives, risks and benefits explained, specific risks discussed. Consent was given by the patient. Immediately prior to procedure a time out was called to verify the correct patient, procedure, equipment, support staff and site/side  marked as required. Patient was prepped and draped in the usual sterile fashion.       Clinical Data: No additional findings.   Subjective: Chief Complaint  Patient presents with  . Left Foot - Pain  . Right Foot - Pain  The patient comes in with multiple chief complaints.  Her main complaint is bilateral foot pain she has burning in both feet.  She has been on Lyrica and gabapentin.  She saw a podiatrist.  She has left Achilles pain.  Both feet hurt the midfoot.  She has normal arches on both sides she states.  She has tried a walking boot but only wore that for about a week.  She is try compressive garments and the only 1 dose for about a week.  She gets foot swelling which has been up on her feet all day long.  She is not a diabetic.  She does have high blood pressure and she is on medications for this.  I have injected her right shoulder remotely before.  She is requesting at least an injection in that shoulder today.  Her biggest complaint though is the bilateral foot pain that gets worse as the day goes on.  HPI  Review of Systems She currently denies any headache, chest pain, shortness of breath, fever,  chills, nausea, vomiting  Objective: Vital Signs: There were no vitals taken for this visit.  Physical Exam She is alert and oriented x3 and in no acute distress Ortho Exam Examination of her right shoulder does show signs of impingement with good range of motion and it is well located.  Rotator cuff is intact on clinical exam.  Examination of both her feet showed normal-appearing arches.  She has significant pain over her left Achilles tendon but a negative Thompson test.  She has pain at the MTP joint of the great toe on both feet.  She has some slight midfoot swelling.  Her feet are warm and I can palpate pulses in both feet and she has good sensation. Specialty Comments:  No specialty comments available.  Imaging: XR Foot Complete Left  Result Date: 09/09/2019 3 views of  left foot show no acute findings.  There is some arthritic changes at the great toe MTP joint and some midfoot arthritic changes.  The arch appears normal.  XR Foot Complete Right  Result Date: 09/09/2019 3 views of the right foot show no acute findings.  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  The arch appears normal.    PMFS History: Patient Active Problem List   Diagnosis Date Noted  . Adenocarcinoma of lung, stage 1, right ([place]) 08/01/2018  . Genetic testing 02/22/2017  . History of breast cancer 02/13/2017  . Family history of breast cancer   . Acute pain of right knee 07/18/2016  . Trochanteric bursitis, right hip 07/18/2016  . Trochanteric bursitis, left hip 05/04/2016  . 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 Adenocarcinoma of lung, stage 1, right ([place]) 2020  . Anxiety   . Arthritis    back  . Breast cancer ([place]) 2000   Right breast  . Cancer ([place]) 2000   rt breast cancer  . Family history of breast cancer   . GERD (gastroesophageal reflux disease)   . H/O hiatal hernia   . Hyperlipidemia   . Hypertension   . IBS (irritable bowel syndrome)   . Neck pain   . PONV (postoperative nausea and vomiting)   . Pre-diabetes     Family History  Problem Relation Age of Onset  . Breast cancer Mother        dx over 92  . Prostate cancer Father        dx late 84s-early 70s  . COPD Father   . Cerebral palsy Brother   . Lung cancer Maternal Uncle        smoker  . Stroke Maternal Grandfather   . Cancer Cousin        NOS  . Breast cancer Daughter 27  . Anesthesia problems Neg Hx   . Hypotension Neg Hx   . Malignant hyperthermia Neg Hx   . Pseudochol deficiency Neg Hx     Past Surgical History:  Procedure Laterality Date  . ABDOMINAL HYSTERECTOMY    . APPENDECTOMY    . BACK SURGERY    . BREAST BIOPSY    . BREAST SURGERY    . CARPOMETACARPEL SUSPENSION  PLASTY Left 08/25/2015   Procedure: SUSPENSIONPLASTY LEFT THUMB TRAPEZIUM EXCISION ABDUCTOR POLLICIS LONGUS TRANSFER;  Surgeon: Daryll Brod, MD;  Location: [facility];  Service: Orthopedics;  Laterality: Left;  . CHOLECYSTECTOMY    . EYE SURGERY     cataract right and  left eye  . GAS INSERTION  06/23/2011   Procedure: INSERTION OF GAS;  Surgeon: Hayden Pedro, MD;  Location: [facility];  Service: Ophthalmology;  Laterality: Left;  SF6  . LOBECTOMY Right 07/26/2018   Procedure: RIGHT LOWER LOBE LOBECTOMY;  Surgeon: Melrose Nakayama, MD;  Location: [facility];  Service: Thoracic;  Laterality: Right;  . MASTECTOMY     right breast  . SCLERAL BUCKLE  06/23/2011   Procedure: SCLERAL BUCKLE;  Surgeon: Hayden Pedro, MD;  Location: [facility];  Service: Ophthalmology;  Laterality: Left;  . SEGMENTECOMY Right 07/26/2018   Procedure: SEGMENTECTOMY;  Surgeon: Melrose Nakayama, MD;  Location: [facility];  Service: Thoracic;  Laterality: Right;  . TONSILLECTOMY    . VIDEO ASSISTED THORACOSCOPY Right 07/26/2018   Procedure: VIDEO ASSISTED THORACOSCOPY;  Surgeon: Melrose Nakayama, MD;  Location: [facility];  Service: Thoracic;  Laterality: Right;   Social History   Occupational History  . Not on file  Tobacco Use  . Smoking status: Former Smoker    Packs/day: 1.00    Years: 30.00    Pack years: 30.00    Types: Cigarettes    Quit date: 06/22/2002    Years since quitting: 17.2  . Smokeless tobacco: Never Used  Vaping Use  . Vaping Use: Never used  Substance and Sexual Activity  . Alcohol use: Yes    Comment: rarely  . Drug use: Yes    Types: Flunitrazepam  . Sexual activity: Not Currently

## 2019-09-10 ENCOUNTER — Other Ambulatory Visit: Payer: Self-pay

## 2019-09-10 DIAGNOSIS — R200 Anesthesia of skin: Secondary | ICD-10-CM

## 2019-09-10 DIAGNOSIS — R202 Paresthesia of skin: Secondary | ICD-10-CM

## 2019-09-16 ENCOUNTER — Inpatient Hospital Stay: Payer: Medicare PPO | Attending: Internal Medicine

## 2019-09-16 ENCOUNTER — Other Ambulatory Visit: Payer: Self-pay

## 2019-09-16 ENCOUNTER — Ambulatory Visit (HOSPITAL_COMMUNITY)
Admission: RE | Admit: 2019-09-16 | Discharge: 2019-09-16 | Disposition: A | Discharge status: Home / Self Care | Payer: Medicare PPO | Source: Ambulatory Visit | Attending: Internal Medicine | Admitting: Internal Medicine

## 2019-09-16 DIAGNOSIS — I2510 Atherosclerotic heart disease of native coronary artery without angina pectoris: Secondary | ICD-10-CM | POA: Diagnosis not present

## 2019-09-16 DIAGNOSIS — Z902 Acquired absence of lung [part of]: Secondary | ICD-10-CM | POA: Diagnosis not present

## 2019-09-16 DIAGNOSIS — F419 Anxiety disorder, unspecified: Secondary | ICD-10-CM | POA: Insufficient documentation

## 2019-09-16 DIAGNOSIS — Z853 Personal history of malignant neoplasm of breast: Secondary | ICD-10-CM | POA: Insufficient documentation

## 2019-09-16 DIAGNOSIS — Z7952 Long term (current) use of systemic steroids: Secondary | ICD-10-CM | POA: Insufficient documentation

## 2019-09-16 DIAGNOSIS — Z9011 Acquired absence of right breast and nipple: Secondary | ICD-10-CM | POA: Diagnosis not present

## 2019-09-16 DIAGNOSIS — C3490 Malignant neoplasm of unspecified part of unspecified bronchus or lung: Secondary | ICD-10-CM

## 2019-09-16 DIAGNOSIS — Z803 Family history of malignant neoplasm of breast: Secondary | ICD-10-CM | POA: Insufficient documentation

## 2019-09-16 DIAGNOSIS — Z85118 Personal history of other malignant neoplasm of bronchus and lung: Secondary | ICD-10-CM | POA: Insufficient documentation

## 2019-09-16 DIAGNOSIS — Z90710 Acquired absence of both cervix and uterus: Secondary | ICD-10-CM | POA: Insufficient documentation

## 2019-09-16 DIAGNOSIS — Z79899 Other long term (current) drug therapy: Secondary | ICD-10-CM | POA: Diagnosis not present

## 2019-09-16 DIAGNOSIS — I700 Atherosclerosis of aorta: Secondary | ICD-10-CM | POA: Diagnosis not present

## 2019-09-16 DIAGNOSIS — Z7982 Long term (current) use of aspirin: Secondary | ICD-10-CM | POA: Insufficient documentation

## 2019-09-16 LAB — CBC WITH DIFFERENTIAL (CANCER CENTER ONLY)
Abs Immature Granulocytes: 0.03 10*3/uL (ref 0.00–0.07)
Basophils Absolute: 0.1 10*3/uL (ref 0.0–0.1)
Basophils Relative: 1 %
Eosinophils Absolute: 0.1 10*3/uL (ref 0.0–0.5)
Eosinophils Relative: 1 %
HCT: 42.9 % (ref 36.0–46.0)
Hemoglobin: 14.5 g/dL (ref 12.0–15.0)
Immature Granulocytes: 0 %
Lymphocytes Relative: 26 %
Lymphs Abs: 2.5 10*3/uL (ref 0.7–4.0)
MCH: 31.7 pg (ref 26.0–34.0)
MCHC: 33.8 g/dL (ref 30.0–36.0)
MCV: 93.9 fL (ref 80.0–100.0)
Monocytes Absolute: 0.9 10*3/uL (ref 0.1–1.0)
Monocytes Relative: 9 %
Neutro Abs: 6.3 10*3/uL (ref 1.7–7.7)
Neutrophils Relative %: 63 %
Platelet Count: 329 10*3/uL (ref 150–400)
RBC: 4.57 MIL/uL (ref 3.87–5.11)
RDW: 13.1 % (ref 11.5–15.5)
WBC Count: 9.9 10*3/uL (ref 4.0–10.5)
nRBC: 0 % (ref 0.0–0.2)

## 2019-09-16 LAB — CMP (CANCER CENTER ONLY)
ALT: 15 U/L (ref 0–44)
AST: 12 U/L — ABNORMAL LOW (ref 15–41)
Albumin: 4 g/dL (ref 3.5–5.0)
Alkaline Phosphatase: 68 U/L (ref 38–126)
Anion gap: 10 (ref 5–15)
BUN: 35 mg/dL — ABNORMAL HIGH (ref 8–23)
CO2: 31 mmol/L (ref 22–32)
Calcium: 10.8 mg/dL — ABNORMAL HIGH (ref 8.9–10.3)
Chloride: 100 mmol/L (ref 98–111)
Creatinine: 1.47 mg/dL — ABNORMAL HIGH (ref 0.44–1.00)
GFR, Est AFR Am: 39 mL/min — ABNORMAL LOW (ref >60)
GFR, Estimated: 34 mL/min — ABNORMAL LOW (ref >60)
Glucose, Bld: 142 mg/dL — ABNORMAL HIGH (ref 70–99)
Potassium: 3.9 mmol/L (ref 3.5–5.1)
Sodium: 141 mmol/L (ref 135–145)
Total Bilirubin: 1.4 mg/dL — ABNORMAL HIGH (ref 0.3–1.2)
Total Protein: 7.4 g/dL (ref 6.5–8.1)

## 2019-09-16 MED ORDER — IOHEXOL 300 MG/ML  SOLN
60.0000 mL | Freq: Once | INTRAMUSCULAR | Status: AC | PRN
  Administered 2019-09-16: 60 mL via INTRAVENOUS

## 2019-09-16 MED ORDER — SODIUM CHLORIDE (PF) 0.9 % IJ SOLN
INTRAMUSCULAR | Status: AC
  Filled 2019-09-16: qty 50

## 2019-09-17 ENCOUNTER — Ambulatory Visit (HOSPITAL_COMMUNITY)
Admission: RE | Admit: 2019-09-17 | Discharge: 2019-09-17 | Disposition: A | Discharge status: Home / Self Care | Payer: Medicare PPO | Source: Ambulatory Visit | Attending: Orthopaedic Surgery | Admitting: Orthopaedic Surgery

## 2019-09-17 DIAGNOSIS — R200 Anesthesia of skin: Secondary | ICD-10-CM

## 2019-09-17 DIAGNOSIS — R202 Paresthesia of skin: Secondary | ICD-10-CM | POA: Diagnosis not present

## 2019-09-17 NOTE — Progress Notes (Signed)
ABI's have been completed. Preliminary results can be found in CV Proc through chart review.   09/17/19 1:31 PM Vanessa Day RVT

## 2019-09-18 ENCOUNTER — Encounter: Payer: Self-pay | Admitting: Internal Medicine

## 2019-09-18 ENCOUNTER — Telehealth: Payer: Self-pay | Admitting: Internal Medicine

## 2019-09-18 ENCOUNTER — Inpatient Hospital Stay: Payer: Medicare PPO | Admitting: Internal Medicine

## 2019-09-18 ENCOUNTER — Other Ambulatory Visit: Payer: Self-pay

## 2019-09-18 VITALS — BP 135/69 | HR 58 | Temp 98.3°F | Resp 17 | Ht 63.0 in | Wt 156.5 lb

## 2019-09-18 DIAGNOSIS — Z9011 Acquired absence of right breast and nipple: Secondary | ICD-10-CM | POA: Diagnosis not present

## 2019-09-18 DIAGNOSIS — Z7952 Long term (current) use of systemic steroids: Secondary | ICD-10-CM | POA: Diagnosis not present

## 2019-09-18 DIAGNOSIS — I2510 Atherosclerotic heart disease of native coronary artery without angina pectoris: Secondary | ICD-10-CM | POA: Diagnosis not present

## 2019-09-18 DIAGNOSIS — Z853 Personal history of malignant neoplasm of breast: Secondary | ICD-10-CM | POA: Diagnosis not present

## 2019-09-18 DIAGNOSIS — C3431 Malignant neoplasm of lower lobe, right bronchus or lung: Secondary | ICD-10-CM | POA: Diagnosis not present

## 2019-09-18 DIAGNOSIS — C3491 Malignant neoplasm of unspecified part of right bronchus or lung: Secondary | ICD-10-CM

## 2019-09-18 DIAGNOSIS — Z7982 Long term (current) use of aspirin: Secondary | ICD-10-CM | POA: Diagnosis not present

## 2019-09-18 DIAGNOSIS — Z902 Acquired absence of lung [part of]: Secondary | ICD-10-CM | POA: Diagnosis not present

## 2019-09-18 DIAGNOSIS — Z85118 Personal history of other malignant neoplasm of bronchus and lung: Secondary | ICD-10-CM | POA: Diagnosis not present

## 2019-09-18 DIAGNOSIS — C3490 Malignant neoplasm of unspecified part of unspecified bronchus or lung: Secondary | ICD-10-CM

## 2019-09-18 DIAGNOSIS — Z79899 Other long term (current) drug therapy: Secondary | ICD-10-CM | POA: Diagnosis not present

## 2019-09-18 DIAGNOSIS — F419 Anxiety disorder, unspecified: Secondary | ICD-10-CM | POA: Diagnosis not present

## 2019-09-18 NOTE — Telephone Encounter (Signed)
Scheduled per los. Gave avs and calendar  

## 2019-09-18 NOTE — Progress Notes (Signed)
[facility] [phone]   [phone]  OFFICE PROGRESS NOTE  Vanessa Neer, MD [address] [place] [address]  DIAGNOSIS: T1a (T1c, N0, M0) non-small cell lung cancer, adenocarcinoma presented with right lower lobe lung nodule in June 2020.  PRIOR THERAPY: Right video-assisted thoracoscopy, Wedge resection right lower lobe nodule, Thoracoscopic right lower lobectomy, Lymph node dissection on July 26, 2018 under the care of Dr. Roxan Hockey  CURRENT THERAPY: Observation.  INTERVAL HISTORY: Vanessa Day 79 y.o. female returns to the clinic today for 6 months follow-up visit.  The patient is feeling fine today with no concerning complaints except for right foot swelling and pain.  She is seen by orthopedic surgery.  She denied having any chest pain, shortness of breath, cough or hemoptysis.  She denied having any nausea, vomiting, diarrhea or constipation.  She has no headache or visual changes.  She had repeat CT scan of the chest and she is here today for evaluation and discussion of her scan results.  MEDICAL HISTORY: Past Medical History:  Diagnosis Date  . Adenocarcinoma of lung, stage 1, right ([place]) 2020  . Anxiety   . Arthritis    back  . Breast cancer ([place]) 2000   Right breast  . Cancer ([place]) 2000   rt breast cancer  . Family history of breast cancer   . GERD (gastroesophageal reflux disease)   . H/O hiatal hernia   . Hyperlipidemia   . Hypertension   . IBS (irritable bowel syndrome)   . Neck pain   . PONV (postoperative nausea and vomiting)   . Pre-diabetes     ALLERGIES:  is allergic to hydrocodone, codeine, statins, and sulfa antibiotics.  MEDICATIONS:  Current Outpatient Medications  Medication Sig Dispense Refill  . acetaminophen (TYLENOL) 500 MG tablet Take 1 tablet (500 mg total) by mouth every 6 (six) hours as needed for mild pain. 30 tablet 0  . alosetron (LOTRONEX) 0.5 MG tablet Take 0.25 mg by  mouth daily.    Marland Kitchen aspirin 81 MG tablet Take 81 mg by mouth daily.    . celecoxib (CELEBREX) 200 MG capsule Take 200 mg by mouth 2 (two) times daily.    . cholecalciferol (VITAMIN D3) 25 MCG (1000 UT) tablet Take 1,000 Units by mouth daily.    Marland Kitchen ezetimibe (ZETIA) 10 MG tablet Take 10 mg by mouth daily.    Marland Kitchen guaiFENesin (MUCINEX) 600 MG 12 hr tablet Take 1 tablet (600 mg total) by mouth 2 (two) times daily as needed for cough or to loosen phlegm.    . hydrochlorothiazide (HYDRODIURIL) 25 MG tablet Take 25 mg by mouth daily.    Marland Kitchen loratadine (CLARITIN) 10 MG tablet Take 10 mg by mouth daily.    Marland Kitchen LORazepam (ATIVAN) 1 MG tablet Take 1-2 mg by mouth at bedtime.     Marland Kitchen losartan (COZAAR) 100 MG tablet Take 100 mg by mouth daily.    [place] Omega-3 Krill Oil 500 MG CAPS Take 500 mg by mouth daily.    Marland Kitchen oxymetazoline (AFRIN) 0.05 % nasal spray Place 1 spray into both nostrils at bedtime as needed for congestion.    . phenol (CHLORASEPTIC) 1.4 % LIQD Use as directed 1 spray in the mouth or throat as needed for throat irritation / pain.  0  . Polyethyl Glycol-Propyl Glycol (SYSTANE OP) Place 1 drop into both eyes 2 (two) times daily as needed (dry eyes).    Marland Kitchen  predniSONE (DELTASONE) 10 MG tablet     . pregabalin (LYRICA) 50 MG capsule Take 50 mg by mouth 2 (two) times daily.     . vitamin E 1000 UNIT capsule Take 1 tablet by mouth daily.     No current facility-administered medications for this visit.    SURGICAL HISTORY:  Past Surgical History:  Procedure Laterality Date  . ABDOMINAL HYSTERECTOMY    . APPENDECTOMY    . BACK SURGERY    . BREAST BIOPSY    . BREAST SURGERY    . CARPOMETACARPEL SUSPENSION PLASTY Left 08/25/2015   Procedure: SUSPENSIONPLASTY LEFT THUMB TRAPEZIUM EXCISION ABDUCTOR POLLICIS LONGUS TRANSFER;  Surgeon: Daryll Brod, MD;  Location: [facility];  Service: Orthopedics;  Laterality: Left;  . CHOLECYSTECTOMY    . EYE SURGERY     cataract right and left eye  . GAS  INSERTION  06/23/2011   Procedure: INSERTION OF GAS;  Surgeon: Hayden Pedro, MD;  Location: [facility];  Service: Ophthalmology;  Laterality: Left;  SF6  . LOBECTOMY Right 07/26/2018   Procedure: RIGHT LOWER LOBE LOBECTOMY;  Surgeon: Melrose Nakayama, MD;  Location: [facility];  Service: Thoracic;  Laterality: Right;  . MASTECTOMY     right breast  . SCLERAL BUCKLE  06/23/2011   Procedure: SCLERAL BUCKLE;  Surgeon: Hayden Pedro, MD;  Location: [facility];  Service: Ophthalmology;  Laterality: Left;  . SEGMENTECOMY Right 07/26/2018   Procedure: SEGMENTECTOMY;  Surgeon: Melrose Nakayama, MD;  Location: [facility];  Service: Thoracic;  Laterality: Right;  . TONSILLECTOMY    . VIDEO ASSISTED THORACOSCOPY Right 07/26/2018   Procedure: VIDEO ASSISTED THORACOSCOPY;  Surgeon: Melrose Nakayama, MD;  Location: [facility] OR;  Service: Thoracic;  Laterality: Right;    REVIEW OF SYSTEMS:  A comprehensive review of systems was negative except for: Musculoskeletal: positive for arthralgias   PHYSICAL EXAMINATION: General appearance: alert, cooperative and no distress Head: Normocephalic, without obvious abnormality, atraumatic Neck: no adenopathy, no JVD, supple, symmetrical, trachea midline and thyroid not enlarged, symmetric, no tenderness/mass/nodules Lymph nodes: Cervical, supraclavicular, and axillary nodes normal. Resp: clear to auscultation bilaterally Back: symmetric, no curvature. ROM normal. No CVA tenderness. Cardio: regular rate and rhythm, S1, S2 normal, no murmur, click, rub or gallop GI: soft, non-tender; bowel sounds normal; no masses,  no organomegaly Extremities: extremities normal, atraumatic, no cyanosis or edema  ECOG PERFORMANCE STATUS: 0 - Asymptomatic  Blood pressure 135/69, pulse (!) 58, temperature 98.3 F (36.8 C), temperature source Tympanic, resp. rate 17, height 5\' 3"  (1.6 m), weight 156 lb 8 oz (71 kg), SpO2 100 %.  LABORATORY DATA: Lab Results  Component Value Date   WBC 9.9  09/16/2019   HGB 14.5 09/16/2019   HCT 42.9 09/16/2019   MCV 93.9 09/16/2019   PLT 329 09/16/2019      Chemistry      Component Value Date/Time   NA 141 09/16/2019 0947   K 3.9 09/16/2019 0947   CL 100 09/16/2019 0947   CO2 31 09/16/2019 0947   BUN 35 (H) 09/16/2019 0947   CREATININE 1.47 (H) 09/16/2019 0947      Component Value Date/Time   CALCIUM 10.8 (H) 09/16/2019 0947   ALKPHOS 68 09/16/2019 0947   AST 12 (L) 09/16/2019 0947   ALT 15 09/16/2019 0947   BILITOT 1.4 (H) 09/16/2019 0947       RADIOGRAPHIC STUDIES: CT Chest W Contrast  Result Date: 09/16/2019 CLINICAL DATA:  Non-small cell lung cancer  staging,, status post right lower lobectomy, history of right breast cancer status post right mastectomy EXAM: CT CHEST WITH CONTRAST TECHNIQUE: Multidetector CT imaging of the chest was performed during intravenous contrast administration. CONTRAST:  20mL OMNIPAQUE IOHEXOL 300 MG/ML  SOLN COMPARISON:  03/19/2019 FINDINGS: Cardiovascular: Aortic atherosclerosis. Normal heart size. Coronary artery calcifications. No pericardial effusion. Mediastinum/Nodes: No enlarged mediastinal, hilar, or axillary lymph nodes. Thyroid gland, trachea, and esophagus demonstrate no significant findings. Lungs/Pleura: Status post right lower lobectomy. No pleural effusion or pneumothorax. Upper Abdomen: No acute abnormality.  Hepatic steatosis. Musculoskeletal: Status post right mastectomy, axillary lymph node dissection, and flap reconstruction. IMPRESSION: 1. Status post right lower lobectomy. 2. Status post right mastectomy. 3. No evidence of recurrent or metastatic disease in the chest. 4. Hepatic steatosis. 5. Coronary artery disease.  Aortic Atherosclerosis (ICD10-I70.0). Electronically Signed   By: Eddie Candle M.D.   On: 09/16/2019 12:36   VAS [place] ABI WITH/WO TBI  Result Date: 09/17/2019 LOWER EXTREMITY DOPPLER STUDY Indications: Numbness. High Risk Factors: None.  Comparison Study: No prior  studies. Performing Technologist: Carlos Levering Rvt  Examination Guidelines: A complete evaluation includes at minimum, Doppler waveform signals and systolic blood pressure reading at the level of bilateral brachial, anterior tibial, and posterior tibial arteries, when vessel segments are accessible. Bilateral testing is considered an integral part of a complete examination. Photoelectric Plethysmograph (PPG) waveforms and toe systolic pressure readings are included as required and additional duplex testing as needed. Limited examinations for reoccurring indications may be performed as noted.  ABI Findings: +---------+------------------+-----+---------+--------+ Right    Rt Pressure (mmHg)IndexWaveform Comment  +---------+------------------+-----+---------+--------+ Brachial 143                    triphasic         +---------+------------------+-----+---------+--------+ PTA      159               1.11 triphasic         +---------+------------------+-----+---------+--------+ DP       152               1.06 triphasic         +---------+------------------+-----+---------+--------+ Great Toe78                0.55                   +---------+------------------+-----+---------+--------+ +---------+------------------+-----+---------+-------+ Left     Lt Pressure (mmHg)IndexWaveform Comment +---------+------------------+-----+---------+-------+ Brachial 130                    triphasic        +---------+------------------+-----+---------+-------+ PTA      140               0.98 triphasic        +---------+------------------+-----+---------+-------+ DP       127               0.89 triphasic        +---------+------------------+-----+---------+-------+ Great Toe71                0.50                  +---------+------------------+-----+---------+-------+ +-------+-----------+-----------+------------+------------+ ABI/TBIToday's ABIToday's TBIPrevious  ABIPrevious TBI +-------+-----------+-----------+------------+------------+ Right  1.11       0.55                                +-------+-----------+-----------+------------+------------+ Left  0.98       0.5                                 +-------+-----------+-----------+------------+------------+  Summary:  *See table(s) above for measurements and observations.  Electronically signed by Harold Barban MD on 09/17/2019 at 5:37:01 PM.   Final    XR Foot Complete Left  Result Date: 09/09/2019 3 views of left foot show no acute findings.  There is some arthritic changes at the great toe MTP joint and some midfoot arthritic changes.  The arch appears normal.  XR Foot Complete Right  Result Date: 09/09/2019 3 views of the right foot show no acute findings.  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  The arch appears normal.   ASSESSMENT AND PLAN: This is a very pleasant 79 years old white female recently diagnosed with a stage IA (T1c, N0, M0) non-small cell lung cancer, adenocarcinoma status post right lower lobectomy with lymph node dissection in July 2020. The patient is currently on observation and she is feeling fine with no concerning complaints. She had repeat CT scan of the chest performed recently.  I personally and independently reviewed the scans and discussed the results with the patient today. Her scan showed no concerning findings for disease recurrence or metastasis. I recommended for her to continue on observation with repeat CT scan of the chest in 6 months. She was advised to call immediately if she has any concerning symptoms in the interval. The patient voices understanding of current disease status and treatment options and is in agreement with the current care plan. All questions were answered. The patient knows to call the clinic with any problems, questions or concerns. We can certainly see the patient much sooner if  necessary.  Disclaimer: This note was dictated with voice recognition software. Similar sounding words can inadvertently be transcribed and may not be corrected upon review.

## 2019-09-23 ENCOUNTER — Other Ambulatory Visit: Payer: Self-pay

## 2019-09-23 ENCOUNTER — Encounter: Payer: Self-pay | Admitting: Physical Therapy

## 2019-09-23 ENCOUNTER — Ambulatory Visit: Payer: Medicare PPO | Admitting: Physical Therapy

## 2019-09-23 DIAGNOSIS — M25571 Pain in right ankle and joints of right foot: Secondary | ICD-10-CM

## 2019-09-23 DIAGNOSIS — R2689 Other abnormalities of gait and mobility: Secondary | ICD-10-CM

## 2019-09-23 DIAGNOSIS — M25572 Pain in left ankle and joints of left foot: Secondary | ICD-10-CM

## 2019-09-23 DIAGNOSIS — M6281 Muscle weakness (generalized): Secondary | ICD-10-CM

## 2019-09-23 DIAGNOSIS — R600 Localized edema: Secondary | ICD-10-CM

## 2019-09-23 NOTE — Therapy (Signed)
[facility] Physical Therapy [address] [place], [place], [address] Phone: [phone]   Fax:  [phone]  Physical Therapy Evaluation  Patient Details  Name: Vanessa Day MRN: [id number] Date of Birth: 1940-08-14 Referring Provider (PT): Ninfa Linden, MD   Encounter Date: 09/23/2019   PT End of Session - 09/23/19 1257    Visit Number 1    Number of Visits 12    Date for PT Re-Evaluation 11/04/19    Authorization Type [employer]    PT Start Time 3875    PT Stop Time 1232    PT Time Calculation (min) 47 min    Activity Tolerance Patient tolerated treatment well    Behavior During Therapy [facility] for tasks assessed/performed           Past Medical History:  Diagnosis Date  . Adenocarcinoma of lung, stage 1, right ([place]) 2020  . Anxiety   . Arthritis    back  . Breast cancer ([place]) 2000   Right breast  . Cancer ([place]) 2000   rt breast cancer  . Family history of breast cancer   . GERD (gastroesophageal reflux disease)   . H/O hiatal hernia   . Hyperlipidemia   . Hypertension   . IBS (irritable bowel syndrome)   . Neck pain   . PONV (postoperative nausea and vomiting)   . Pre-diabetes     Past Surgical History:  Procedure Laterality Date  . ABDOMINAL HYSTERECTOMY    . APPENDECTOMY    . BACK SURGERY    . BREAST BIOPSY    . BREAST SURGERY    . CARPOMETACARPEL SUSPENSION PLASTY Left 08/25/2015   Procedure: SUSPENSIONPLASTY LEFT THUMB TRAPEZIUM EXCISION ABDUCTOR POLLICIS LONGUS TRANSFER;  Surgeon: Daryll Brod, MD;  Location: [place];  Service: Orthopedics;  Laterality: Left;  . CHOLECYSTECTOMY    . EYE SURGERY     cataract right and left eye  . GAS INSERTION  06/23/2011   Procedure: INSERTION OF GAS;  Surgeon: Hayden Pedro, MD;  Location: [place];  Service: Ophthalmology;  Laterality: Left;  SF6  . LOBECTOMY Right 07/26/2018   Procedure: RIGHT LOWER LOBE LOBECTOMY;  Surgeon: Melrose Nakayama, MD;  Location: [place];  Service:  Thoracic;  Laterality: Right;  . MASTECTOMY     right breast  . SCLERAL BUCKLE  06/23/2011   Procedure: SCLERAL BUCKLE;  Surgeon: Hayden Pedro, MD;  Location: [place];  Service: Ophthalmology;  Laterality: Left;  . SEGMENTECOMY Right 07/26/2018   Procedure: SEGMENTECTOMY;  Surgeon: Melrose Nakayama, MD;  Location: [place];  Service: Thoracic;  Laterality: Right;  . TONSILLECTOMY    . VIDEO ASSISTED THORACOSCOPY Right 07/26/2018   Procedure: VIDEO ASSISTED THORACOSCOPY;  Surgeon: Melrose Nakayama, MD;  Location: [place];  Service: Thoracic;  Laterality: Right;    There were no vitals filed for this visit.    Subjective Assessment - 09/23/19 1152    Subjective Her main complaint is bilateral foot pain she has burning in both feet for last 3 months.  She has been on Lyrica and gabapentin.  She saw a podiatrist.  She has left Achilles pain.  Both feet hurt the midfoot.  She has normal arches on both sides she states.  She has tried a walking boot but only wore that for about a week.  She is try compressive garments and the only 1 dose for about a week.  She gets foot swelling which has been up on her feet all day  long.  She is not a diabetic.  She does have high blood pressure and she is on medications for this. She does relay back surgery in the past but not sure when or what was done.    Pertinent History Lung cancer, anxiety, arthritis, breast cancer, HTN, HLD, back surgery    How long can you stand comfortably? 30 minutes max    How long can you walk comfortably? 10 minutes    Diagnostic tests XR 09/09/19 "Lt and Rt foot show no acute findings.  There is some arthritic changes at the great toe MTP joint and some midfoot arthritic changes.   The arch appears normal."    Patient Stated Goals be able to walk the dogs    Currently in Pain? Yes    Pain Score 4    was at 8 a couple weeks ago but improving wearing shoes   Pain Location Foot    Pain Orientation Right;Left   Left is worse than Rt    Pain Descriptors / Indicators Burning;Tingling;Numbness    Pain Type Chronic pain    Pain Radiating Towards Lt achillies    Pain Onset More than a month ago    Pain Frequency Constant    Aggravating Factors  standing and walking    Pain Relieving Factors wearing shoes, elevating feet, ice              [facility] PT Assessment - 09/23/19 0001      Assessment   Medical Diagnosis R20.0,R20.2 (ICD-10-CM) - Numbness and tingling of foot, bilateral feet pain, Lt achillies tendonitis    Referring Provider (PT) Ninfa Linden, MD    Onset Date/Surgical Date --   3 month onset of pain   Next MD Visit 2 weeks    Prior Therapy nothing recent for this problem      Precautions   Precautions None      Restrictions   Other Position/Activity Restrictions MD recommending tennis shoes at this time and to not be in sandals or barefoot      Balance Screen   Has the patient fallen in the past 6 months No    Has the patient had a decrease in activity level because of a fear of falling?  No    Is the patient reluctant to leave their home because of a fear of falling?  No      Home Environment   Living Environment Private residence    Additional Comments has stairs with one handrail on Lt going up, bedroom is upstairs, and laundry is downstairs      Prior Function   Level of [place] Retired    Leisure walk Designer, jewellery   Overall Cognitive Status Within Functional Limits for tasks assessed      Observation/Other Assessments   Observations high arches in static standing but pronation with gait      Observation/Other Assessments-Edema    Edema --   mild edema noted today bilat feet     Sensation   Light Touch Appears Intact      Coordination   Gross Motor Movements are Fluid and Coordinated Yes      ROM / Strength   AROM / PROM / Strength AROM;Strength      AROM   AROM Assessment Site Ankle    Right/Left Ankle Right;Left    Right Ankle Dorsiflexion 10     Right Ankle Plantar Flexion --   WNL  Right Ankle Inversion --   WNL   Right Ankle Eversion --   WNL   Left Ankle Dorsiflexion 6    Left Ankle Plantar Flexion --   WNL   Left Ankle Inversion --   WNL   Left Ankle Eversion --   WNL     Strength   Overall Strength Comments overall 4 to 4+ Left leg strength, overall 4+ Rt leg strength. Overall ankle strength 4+ all planes blat      Flexibility   Soft Tissue Assessment /Muscle Length --   tightness note in gastroc-soleus     Palpation   Palpation comment TTP Lt achilles tendon      Transfers   Transfers Independent with all Transfers      Ambulation/Gait   Gait Comments ambulates independently but mild antalgic gait on Lt, wider BOS with feet into more ER, mild pronation noted           Objective measurements completed on examination: See above findings.       [place] Adult PT Treatment/Exercise - 09/23/19 0001      Modalities   Modalities Vasopneumatic      Vasopneumatic   Number Minutes Vasopneumatic  10 minutes    Vasopnuematic Location  Ankle   bilat   Vasopneumatic Pressure Medium    Vasopneumatic Temperature  34      Manual Therapy   Manual therapy comments KT tape to assist Lt achillies one I strip vertical and 1 I strip horizontal                  PT Education - 09/23/19 1256    Education Details HEP,POC, KT tape    Person(s) Educated Patient    Methods Explanation;Demonstration;Verbal cues;Handout    Comprehension Verbalized understanding;Need further instruction               PT Long Term Goals - 09/23/19 1307      PT LONG TERM GOAL #1   Title Pt will be I and compliant with HEP.    Time 6    Period Weeks    Status New    Target Date 11/04/19      PT LONG TERM GOAL #2   Title Pt will improve overall leg strength to 4+ to 5- tested in sitting to improve function.    Time 6    Period Weeks    Status New      PT LONG TERM GOAL #3   Title Pt wil improve Lt ankle DF ROM to at least 9  degrees to improve functional gait.    Time 6    Period Weeks    Status New      PT LONG TERM GOAL #4   Title Pt will have overall less than 3/10 pain with standing and walking activity at least 30 minutes or with up/down 3 flights of stairs.    Time 6    Period Weeks    Status New                  Plan - 09/23/19 1257    Clinical Impression Statement Pt presents with bilateral feet pain and swelling with Lt achillies tendonitits. She relays this has improved at least 50% since wearing supportive tennis shoes recommended by her MD. She has overall decreased leg strength, decreased DF ROM, decreased activity tolerance for standing, walking, and stairs, and decreased balance. She will benefit from skilled PT to address these deficits. PT will  further screen back next visit to rule in/out lumbar radiculoapthy    Personal Factors and Comorbidities Comorbidity 3+    Comorbidities Lung cancer, anxiety, arthritis, breast cancer, HTN, HLD, back surgery    Examination-Activity Limitations Bend;Carry;Squat;Stairs;Lift;Stand;Locomotion Level    Examination-Participation Restrictions Cleaning;Community Activity;Shop    Stability/Clinical Decision Making Evolving/Moderate complexity    Clinical Decision Making Moderate    Rehab Potential Good    PT Frequency 2x / week   1-2   PT Duration 6 weeks    PT Treatment/Interventions ADLs/Self Care Home Management;Cryotherapy;Electrical Stimulation;Iontophoresis 4mg /ml Dexamethasone;Moist Heat;Ultrasound;Traction;Gait training;Stair training;Therapeutic activities;Therapeutic exercise;Balance training;Neuromuscular re-education;Manual techniques;Passive range of motion;Dry needling;Joint Manipulations;Taping    PT Next Visit Plan further screen lumbar, how was HEP and KT tape?    PT Home Exercise Plan Access Code: [email or web address]    Consulted and Agree with Plan of Care Patient           Patient will benefit from skilled therapeutic intervention in order  to improve the following deficits and impairments:  Abnormal gait, Decreased activity tolerance, Decreased balance, Decreased mobility, Decreased endurance, Decreased range of motion, Decreased strength, Increased edema, Difficulty walking, Increased fascial restricitons, Increased muscle spasms, Impaired flexibility, Postural dysfunction, Pain  Visit Diagnosis: Pain in left ankle and joints of left foot  Pain in right ankle and joints of right foot  Other abnormalities of gait and mobility  Muscle weakness (generalized)  Localized edema     Problem List Patient Active Problem List   Diagnosis Date Noted  . Adenocarcinoma of lung, stage 1, right ([place]) 08/01/2018  . Genetic testing 02/22/2017  . History of breast cancer 02/13/2017  . Family history of breast cancer   . Acute pain of right knee 07/18/2016  . Trochanteric bursitis, right hip 07/18/2016  . Trochanteric bursitis, left hip 05/04/2016  . 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 [place], [place], [address] Phone: [phone]   Fax:  [phone]  Name: Vanessa Day MRN: [id number] Date of Birth: 01-13-1941

## 2019-09-23 NOTE — Patient Instructions (Signed)
Access Code: [email or web address] URL: [email or web address] Date: 09/23/2019 Prepared by: Elsie Ra  Exercises Standing Gastroc Stretch - 2 x daily - 6 x weekly - 1 sets - 3 reps - 30 hold Standing Soleus Stretch - 2 x daily - 6 x weekly - 1 sets - 3 reps - 30 hold Heel Toe Raises with Counter Support - 2 x daily - 6 x weekly - 10 reps - 2 sets Seated Arch Lifts - 2 x daily - 6 x weekly - 10 reps - 2-3 sets

## 2019-10-01 ENCOUNTER — Ambulatory Visit (INDEPENDENT_AMBULATORY_CARE_PROVIDER_SITE_OTHER): Payer: Medicare PPO | Admitting: Physical Therapy

## 2019-10-01 ENCOUNTER — Other Ambulatory Visit: Payer: Self-pay

## 2019-10-01 DIAGNOSIS — M6281 Muscle weakness (generalized): Secondary | ICD-10-CM

## 2019-10-01 DIAGNOSIS — R600 Localized edema: Secondary | ICD-10-CM

## 2019-10-01 DIAGNOSIS — R2689 Other abnormalities of gait and mobility: Secondary | ICD-10-CM | POA: Diagnosis not present

## 2019-10-01 DIAGNOSIS — M25571 Pain in right ankle and joints of right foot: Secondary | ICD-10-CM

## 2019-10-01 DIAGNOSIS — M25572 Pain in left ankle and joints of left foot: Secondary | ICD-10-CM

## 2019-10-01 NOTE — Therapy (Signed)
[facility] Physical Therapy [address] [place], [place], [address] Phone: [phone]   Fax:  [phone]  Physical Therapy Treatment  Patient Details  Name: Vanessa Day MRN: [id number] Date of Birth: [birth year] Referring Provider (PT): Ninfa Linden, MD   Encounter Date: 10/01/2019   PT End of Session - 10/01/19 1424    Visit Number 2    Number of Visits 12    Date for PT Re-Evaluation 11/04/19    Authorization Type [employer]    PT Start Time 0175    PT Stop Time 1432    PT Time Calculation (min) 47 min    Activity Tolerance Patient tolerated treatment well    Behavior During Therapy [facility] for tasks assessed/performed           Past Medical History:  Diagnosis Date  . Adenocarcinoma of lung, stage 1, right ([place]) 2020  . Anxiety   . Arthritis    back  . Breast cancer ([place]) 2000   Right breast  . Cancer ([place]) 2000   rt breast cancer  . Family history of breast cancer   . GERD (gastroesophageal reflux disease)   . H/O hiatal hernia   . Hyperlipidemia   . Hypertension   . IBS (irritable bowel syndrome)   . Neck pain   . PONV (postoperative nausea and vomiting)   . Pre-diabetes     Past Surgical History:  Procedure Laterality Date  . ABDOMINAL HYSTERECTOMY    . APPENDECTOMY    . BACK SURGERY    . BREAST BIOPSY    . BREAST SURGERY    . CARPOMETACARPEL SUSPENSION PLASTY Left 08/25/2015   Procedure: SUSPENSIONPLASTY LEFT THUMB TRAPEZIUM EXCISION ABDUCTOR POLLICIS LONGUS TRANSFER;  Surgeon: Daryll Brod, MD;  Location: [place];  Service: Orthopedics;  Laterality: Left;  . CHOLECYSTECTOMY    . EYE SURGERY     cataract right and left eye  . GAS INSERTION  06/23/2011   Procedure: INSERTION OF GAS;  Surgeon: Hayden Pedro, MD;  Location: [place];  Service: Ophthalmology;  Laterality: Left;  SF6  . LOBECTOMY Right 07/26/2018   Procedure: RIGHT LOWER LOBE LOBECTOMY;  Surgeon: Melrose Nakayama, MD;  Location: [place];  Service: Thoracic;   Laterality: Right;  . MASTECTOMY     right breast  . SCLERAL BUCKLE  06/23/2011   Procedure: SCLERAL BUCKLE;  Surgeon: Hayden Pedro, MD;  Location: [place];  Service: Ophthalmology;  Laterality: Left;  . SEGMENTECOMY Right 07/26/2018   Procedure: SEGMENTECTOMY;  Surgeon: Melrose Nakayama, MD;  Location: [place];  Service: Thoracic;  Laterality: Right;  . TONSILLECTOMY    . VIDEO ASSISTED THORACOSCOPY Right 07/26/2018   Procedure: VIDEO ASSISTED THORACOSCOPY;  Surgeon: Melrose Nakayama, MD;  Location: [place];  Service: Thoracic;  Laterality: Right;    There were no vitals filed for this visit.   Subjective Assessment - 10/01/19 1400    Subjective She relays the tape helped from last time. She did a lot of activity over the weekend including stairs, laundry, walking the dog so more pain today about 6/10 in her feet    Pertinent History Lung cancer, anxiety, arthritis, breast cancer, HTN, HLD, back surgery    How long can you stand comfortably? 30 minutes max    How long can you walk comfortably? 10 minutes    Diagnostic tests XR 09/09/19 "Lt and Rt foot show no acute findings.  There is some arthritic changes at the great toe MTP joint  and some midfoot arthritic changes.   The arch appears normal."    Patient Stated Goals be able to walk the dogs    Pain Onset More than a month ago            [facility] Adult PT Treatment/Exercise - 10/01/19 0001      Exercises   Exercises Ankle      Modalities   Modalities Moist Heat      Moist Heat Therapy   Number Minutes Moist Heat 7 Minutes    Moist Heat Location Ankle   bilat     Manual Therapy   Manual therapy comments KT tape to assist Lt achillies one I strip vertical and 1 I strip horizontal      Ankle Exercises: Stretches   Soleus Stretch 3 reps;30 seconds    Soleus Stretch Limitations slantboard    Gastroc Stretch 3 reps;30 seconds    Gastroc Stretch Limitations slantboard      Ankle Exercises: Aerobic   Nustep L4 X 5 min LE  only      Ankle Exercises: Standing   Other Standing Ankle Exercises tandem balance 30 sec X 2 bilat      Ankle Exercises: Seated   Other Seated Ankle Exercises rocker board 1.5 min lateral, 1.5 min A-P    Other Seated Ankle Exercises seated heel toe raises with UE resistance applied downward 3X10 bilat      Ankle Exercises: Supine   T-Band red 4 way X 15 reps all planes                       PT Long Term Goals - 09/23/19 1307      PT LONG TERM GOAL #1   Title Pt will be I and compliant with HEP.    Time 6    Period Weeks    Status New    Target Date 11/04/19      PT LONG TERM GOAL #2   Title Pt will improve overall leg strength to 4+ to 5- tested in sitting to improve function.    Time 6    Period Weeks    Status New      PT LONG TERM GOAL #3   Title Pt wil improve Lt ankle DF ROM to at least 9 degrees to improve functional gait.    Time 6    Period Weeks    Status New      PT LONG TERM GOAL #4   Title Pt will have overall less than 3/10 pain with standing and walking activity at least 30 minutes or with up/down 3 flights of stairs.    Time 6    Period Weeks    Status New                 Plan - 10/01/19 1425    Clinical Impression Statement Session focused on stretching and strengthening for bilat ankle. Lt ankle continues to be worse than Rt and continued with KT tape for this as she relays this helped last time. Overall pain is better from eval overall but she gets pain if she pivots. Pain more localized to achillies tendon now. Continue POC.    Personal Factors and Comorbidities Comorbidity 3+    Comorbidities Lung cancer, anxiety, arthritis, breast cancer, HTN, HLD, back surgery    Examination-Activity Limitations Bend;Carry;Squat;Stairs;Lift;Stand;Locomotion Level    Examination-Participation Restrictions Cleaning;Community Activity;Shop    Stability/Clinical Decision Making Evolving/Moderate complexity    Rehab Potential  Good    PT  Frequency 2x / week   1-2   PT Duration 6 weeks    PT Treatment/Interventions ADLs/Self Care Home Management;Cryotherapy;Electrical Stimulation;Iontophoresis 4mg /ml Dexamethasone;Moist Heat;Ultrasound;Traction;Gait training;Stair training;Therapeutic activities;Therapeutic exercise;Balance training;Neuromuscular re-education;Manual techniques;Passive range of motion;Dry needling;Joint Manipulations;Taping    PT Next Visit Plan KT tape and modalaties PRN. Needs bilat ankle strength, ROM, balance.    PT Home Exercise Plan Access Code: [email or web address]    Consulted and Agree with Plan of Care Patient           Patient will benefit from skilled therapeutic intervention in order to improve the following deficits and impairments:  Abnormal gait, Decreased activity tolerance, Decreased balance, Decreased mobility, Decreased endurance, Decreased range of motion, Decreased strength, Increased edema, Difficulty walking, Increased fascial restricitons, Increased muscle spasms, Impaired flexibility, Postural dysfunction, Pain  Visit Diagnosis: Pain in left ankle and joints of left foot  Pain in right ankle and joints of right foot  Other abnormalities of gait and mobility  Muscle weakness (generalized)  Localized edema     Problem List Patient Active Problem List   Diagnosis Date Noted  . Adenocarcinoma of lung, stage 1, right ([place]) 08/01/2018  . Genetic testing 02/22/2017  . History of breast cancer 02/13/2017  . Family history of breast cancer   . Acute pain of right knee 07/18/2016  . Trochanteric bursitis, right hip 07/18/2016  . Trochanteric bursitis, left hip 05/04/2016  . 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 [place], [place], [address] Phone: [phone]   Fax:  [phone]  Name: Vanessa Day MRN: [id number] Date of Birth: 05/23/1940

## 2019-10-04 ENCOUNTER — Other Ambulatory Visit: Payer: Self-pay

## 2019-10-04 ENCOUNTER — Encounter: Payer: Self-pay | Admitting: Physical Therapy

## 2019-10-04 ENCOUNTER — Ambulatory Visit (INDEPENDENT_AMBULATORY_CARE_PROVIDER_SITE_OTHER): Payer: Medicare PPO | Admitting: Physical Therapy

## 2019-10-04 DIAGNOSIS — R2689 Other abnormalities of gait and mobility: Secondary | ICD-10-CM

## 2019-10-04 DIAGNOSIS — M6281 Muscle weakness (generalized): Secondary | ICD-10-CM

## 2019-10-04 DIAGNOSIS — M25572 Pain in left ankle and joints of left foot: Secondary | ICD-10-CM

## 2019-10-04 DIAGNOSIS — R600 Localized edema: Secondary | ICD-10-CM | POA: Diagnosis not present

## 2019-10-04 DIAGNOSIS — M25571 Pain in right ankle and joints of right foot: Secondary | ICD-10-CM | POA: Diagnosis not present

## 2019-10-04 NOTE — Therapy (Signed)
[facility] [address] [place], [place], [address] Phone: [phone]   Fax:  [phone]  Physical Therapy Treatment  Patient Details  Name: Vanessa Day MRN: [id number] Date of Birth: 1940/07/13 Referring Provider (PT): Ninfa Linden, MD   Encounter Date: 10/04/2019   PT End of Session - 10/04/19 1128    Visit Number 3    Number of Visits 12    Date for PT Re-Evaluation 11/04/19    Authorization Type [employer]    PT Start Time 0800    PT Stop Time 0843    PT Time Calculation (min) 43 min    Activity Tolerance Patient tolerated treatment well    Behavior During Therapy [facility] for tasks assessed/performed           Past Medical History:  Diagnosis Date  . Adenocarcinoma of lung, stage 1, right ([place]) 2020  . Anxiety   . Arthritis    back  . Breast cancer ([place]) 2000   Right breast  . Cancer ([place]) 2000   rt breast cancer  . Family history of breast cancer   . GERD (gastroesophageal reflux disease)   . H/O hiatal hernia   . Hyperlipidemia   . Hypertension   . IBS (irritable bowel syndrome)   . Neck pain   . PONV (postoperative nausea and vomiting)   . Pre-diabetes     Past Surgical History:  Procedure Laterality Date  . ABDOMINAL HYSTERECTOMY    . APPENDECTOMY    . BACK SURGERY    . BREAST BIOPSY    . BREAST SURGERY    . CARPOMETACARPEL SUSPENSION PLASTY Left 08/25/2015   Procedure: SUSPENSIONPLASTY LEFT THUMB TRAPEZIUM EXCISION ABDUCTOR POLLICIS LONGUS TRANSFER;  Surgeon: Daryll Brod, MD;  Location: [place];  Service: Orthopedics;  Laterality: Left;  . CHOLECYSTECTOMY    . EYE SURGERY     cataract right and left eye  . GAS INSERTION  06/23/2011   Procedure: INSERTION OF GAS;  Surgeon: Hayden Pedro, MD;  Location: [place];  Service: Ophthalmology;  Laterality: Left;  SF6  . LOBECTOMY Right 07/26/2018   Procedure: RIGHT LOWER LOBE LOBECTOMY;  Surgeon: Melrose Nakayama, MD;  Location: [place];  Service: Thoracic;   Laterality: Right;  . MASTECTOMY     right breast  . SCLERAL BUCKLE  06/23/2011   Procedure: SCLERAL BUCKLE;  Surgeon: Hayden Pedro, MD;  Location: [place];  Service: Ophthalmology;  Laterality: Left;  . SEGMENTECOMY Right 07/26/2018   Procedure: SEGMENTECTOMY;  Surgeon: Melrose Nakayama, MD;  Location: [place];  Service: Thoracic;  Laterality: Right;  . TONSILLECTOMY    . VIDEO ASSISTED THORACOSCOPY Right 07/26/2018   Procedure: VIDEO ASSISTED THORACOSCOPY;  Surgeon: Melrose Nakayama, MD;  Location: [place];  Service: Thoracic;  Laterality: Right;    There were no vitals filed for this visit.   Subjective Assessment - 10/04/19 1126    Subjective Pt arriving to therapy reporting 4-5/10 pain in L heel.    Pertinent History Lung cancer, anxiety, arthritis, breast cancer, HTN, HLD, back surgery    How long can you stand comfortably? 30 minutes max    How long can you walk comfortably? 10 minutes    Diagnostic tests XR 09/09/19 "Lt and Rt foot show no acute findings.  There is some arthritic changes at the great toe MTP joint and some midfoot arthritic changes.   The arch appears normal."    Patient Stated Goals be able to walk the  dogs    Currently in Pain? Yes    Pain Score 5     Pain Location Foot    Pain Orientation Left    Pain Descriptors / Indicators Burning;Sore    Pain Type Chronic pain    Pain Onset More than a month ago                             [facility] Adult PT Treatment/Exercise - 10/04/19 0001      Exercises   Exercises Ankle      Modalities   Modalities Moist Heat      Moist Heat Therapy   Number Minutes Moist Heat 5 Minutes    Moist Heat Location Ankle      Manual Therapy   Manual therapy comments KT tape to assist Lt achillies one I strip vertical and 1 I strip horizontal      Ankle Exercises: Stretches   Soleus Stretch 3 reps;30 seconds    Soleus Stretch Limitations slantboard    Gastroc Stretch 3 reps;30 seconds    Gastroc Stretch  Limitations slantboard      Ankle Exercises: Aerobic   Nustep L4 X 7 min LE only      Ankle Exercises: Standing   Rocker Board 2 minutes    Rocker Board Limitations side to side x 2 minutes    Heel Raises 10 reps    Toe Raise Limitations 10 reps    Other Standing Ankle Exercises L LE SLS with UE support, vector reaching with R LE x 10    Other Standing Ankle Exercises SLS with UE support intermittently, tandum walking , pt reporting more pain on L                        PT Long Term Goals - 10/04/19 1129      PT LONG TERM GOAL #1   Title Pt will be I and compliant with HEP.    Status On-going      PT LONG TERM GOAL #4   Title Pt will have overall less than 3/10 pain with standing and walking activity at least 30 minutes or with up/down 3 flights of stairs.    Status On-going                 Plan - 10/04/19 1130    Clinical Impression Statement Pt tolerating exericses well today reporting good response to achilles taping from past visit. Pt focusing on SLS activities, ROM and gentle strengthening. KT was applied at the end of session with 5 minutes of moist heat. Pt reporting decrease in pain level at end of session to 2-3/10. Continue skilled PT.    Personal Factors and Comorbidities Comorbidity 3+    Comorbidities Lung cancer, anxiety, arthritis, breast cancer, HTN, HLD, back surgery    Examination-Activity Limitations Bend;Carry;Squat;Stairs;Lift;Stand;Locomotion Level    Examination-Participation Restrictions Cleaning;Community Activity;Shop    Stability/Clinical Decision Making Evolving/Moderate complexity    Rehab Potential Good    PT Frequency 2x / week    PT Duration 6 weeks    PT Treatment/Interventions ADLs/Self Care Home Management;Cryotherapy;Electrical Stimulation;Iontophoresis 4mg /ml Dexamethasone;Moist Heat;Ultrasound;Traction;Gait training;Stair training;Therapeutic activities;Therapeutic exercise;Balance training;Neuromuscular  re-education;Manual techniques;Passive range of motion;Dry needling;Joint Manipulations;Taping    PT Next Visit Plan KT tape and modalaties PRN. Needs bilat ankle strength, ROM, balance.    PT Home Exercise Plan Access Code: [email or web address]    [email or web address] and Agree with Plan of  Care Patient           Patient will benefit from skilled therapeutic intervention in order to improve the following deficits and impairments:  Abnormal gait, Decreased activity tolerance, Decreased balance, Decreased mobility, Decreased endurance, Decreased range of motion, Decreased strength, Increased edema, Difficulty walking, Increased fascial restricitons, Increased muscle spasms, Impaired flexibility, Postural dysfunction, Pain  Visit Diagnosis: Pain in left ankle and joints of left foot  Pain in right ankle and joints of right foot  Other abnormalities of gait and mobility  Muscle weakness (generalized)  Localized edema     Problem List Patient Active Problem List   Diagnosis Date Noted  . Adenocarcinoma of lung, stage 1, right ([place]) 08/01/2018  . Genetic testing 02/22/2017  . History of breast cancer 02/13/2017  . Family history of breast cancer   . Acute pain of right knee 07/18/2016  . Trochanteric bursitis, right hip 07/18/2016  . Trochanteric bursitis, left hip 05/04/2016  . 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 [place], [place], [address] Phone: [phone]   Fax:  [phone]  Name: Vanessa Day MRN: [id number] Date of Birth: [birth year]

## 2019-10-05 IMAGING — CR CHEST - 2 VIEW
2 series · 2 of 2 positions shown · non-contrast
Comparison: Radiographs August 02, 2018.

CLINICAL DATA: History of video assisted thoracic surgery.

EXAM:
CHEST - 2 VIEW

[w chest pa]
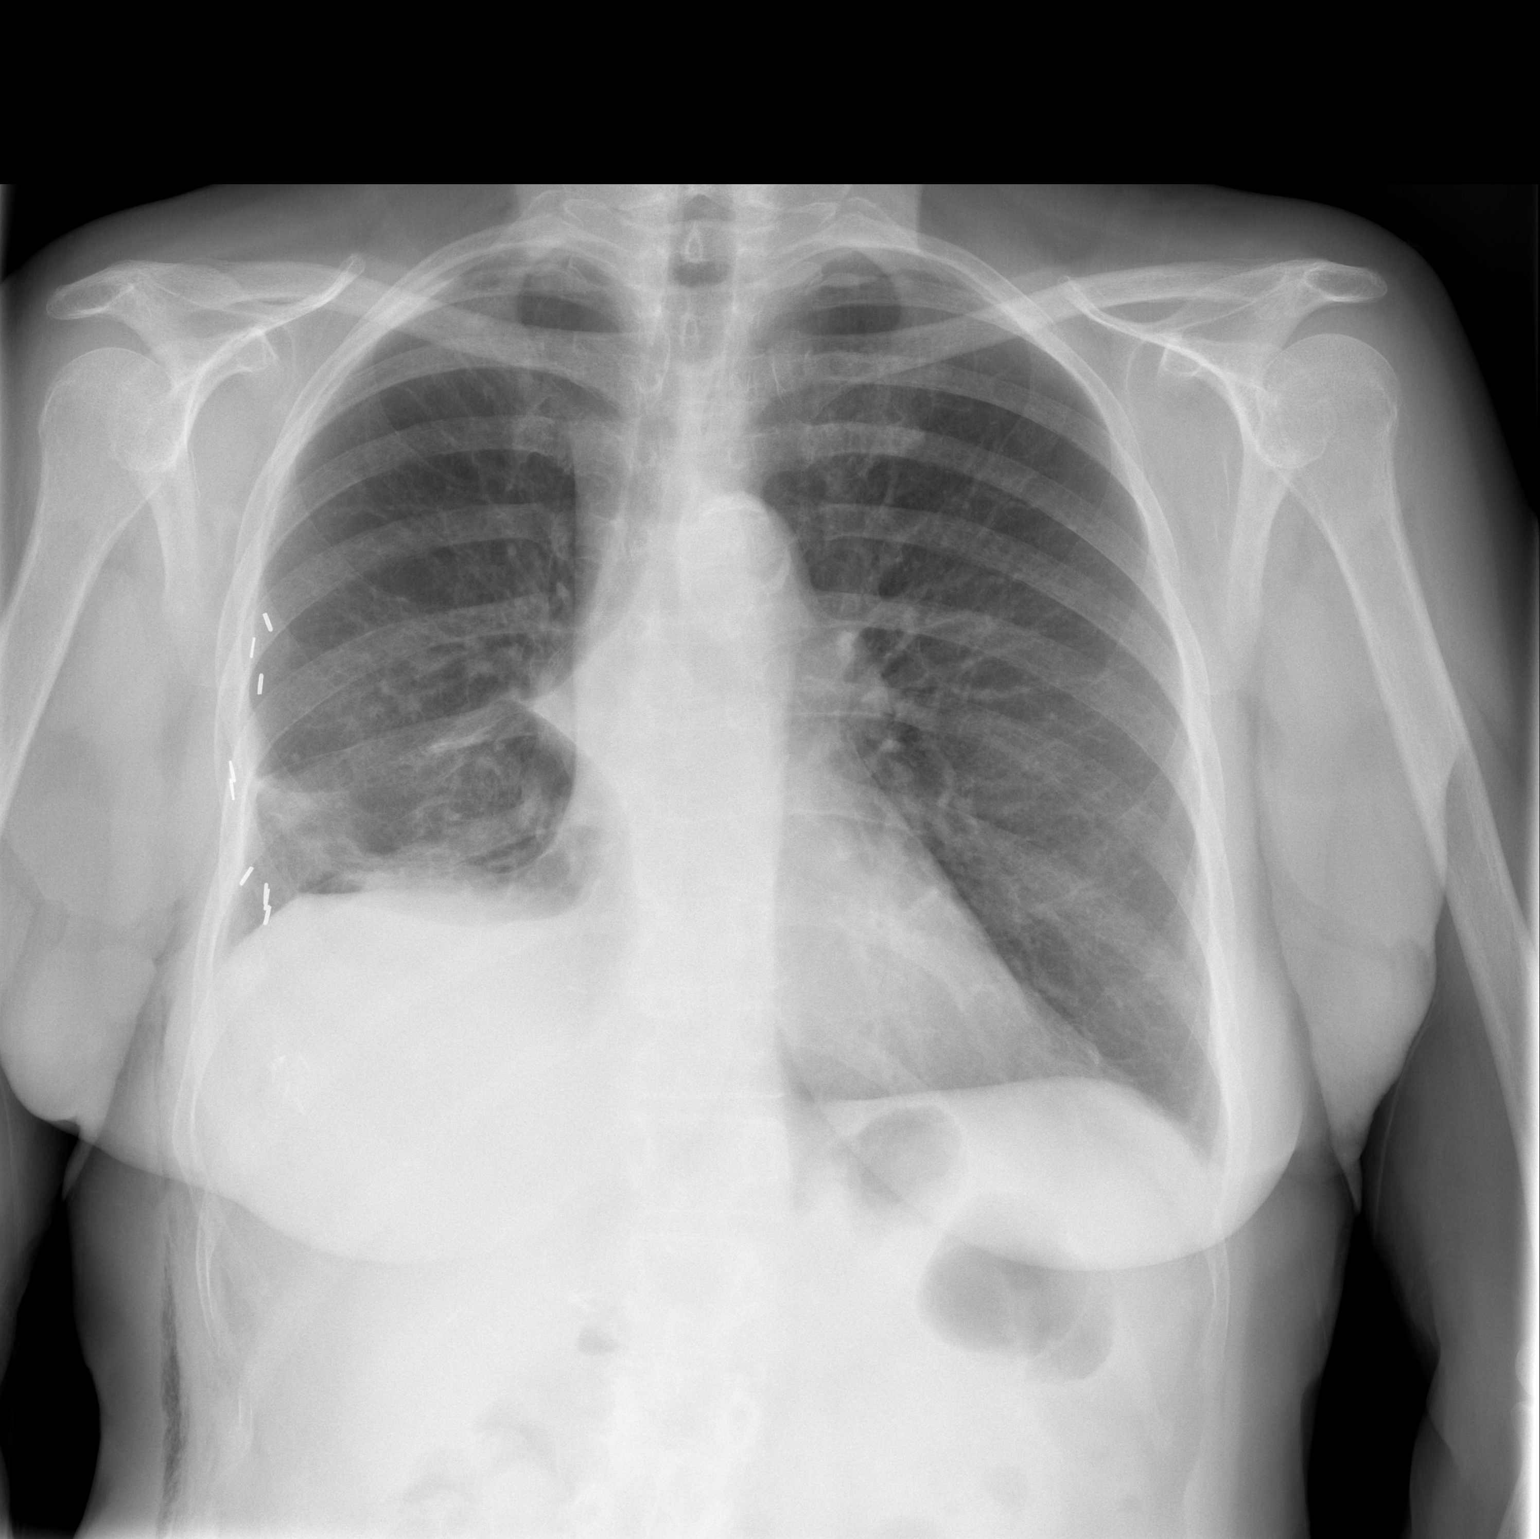

[w chest lat]
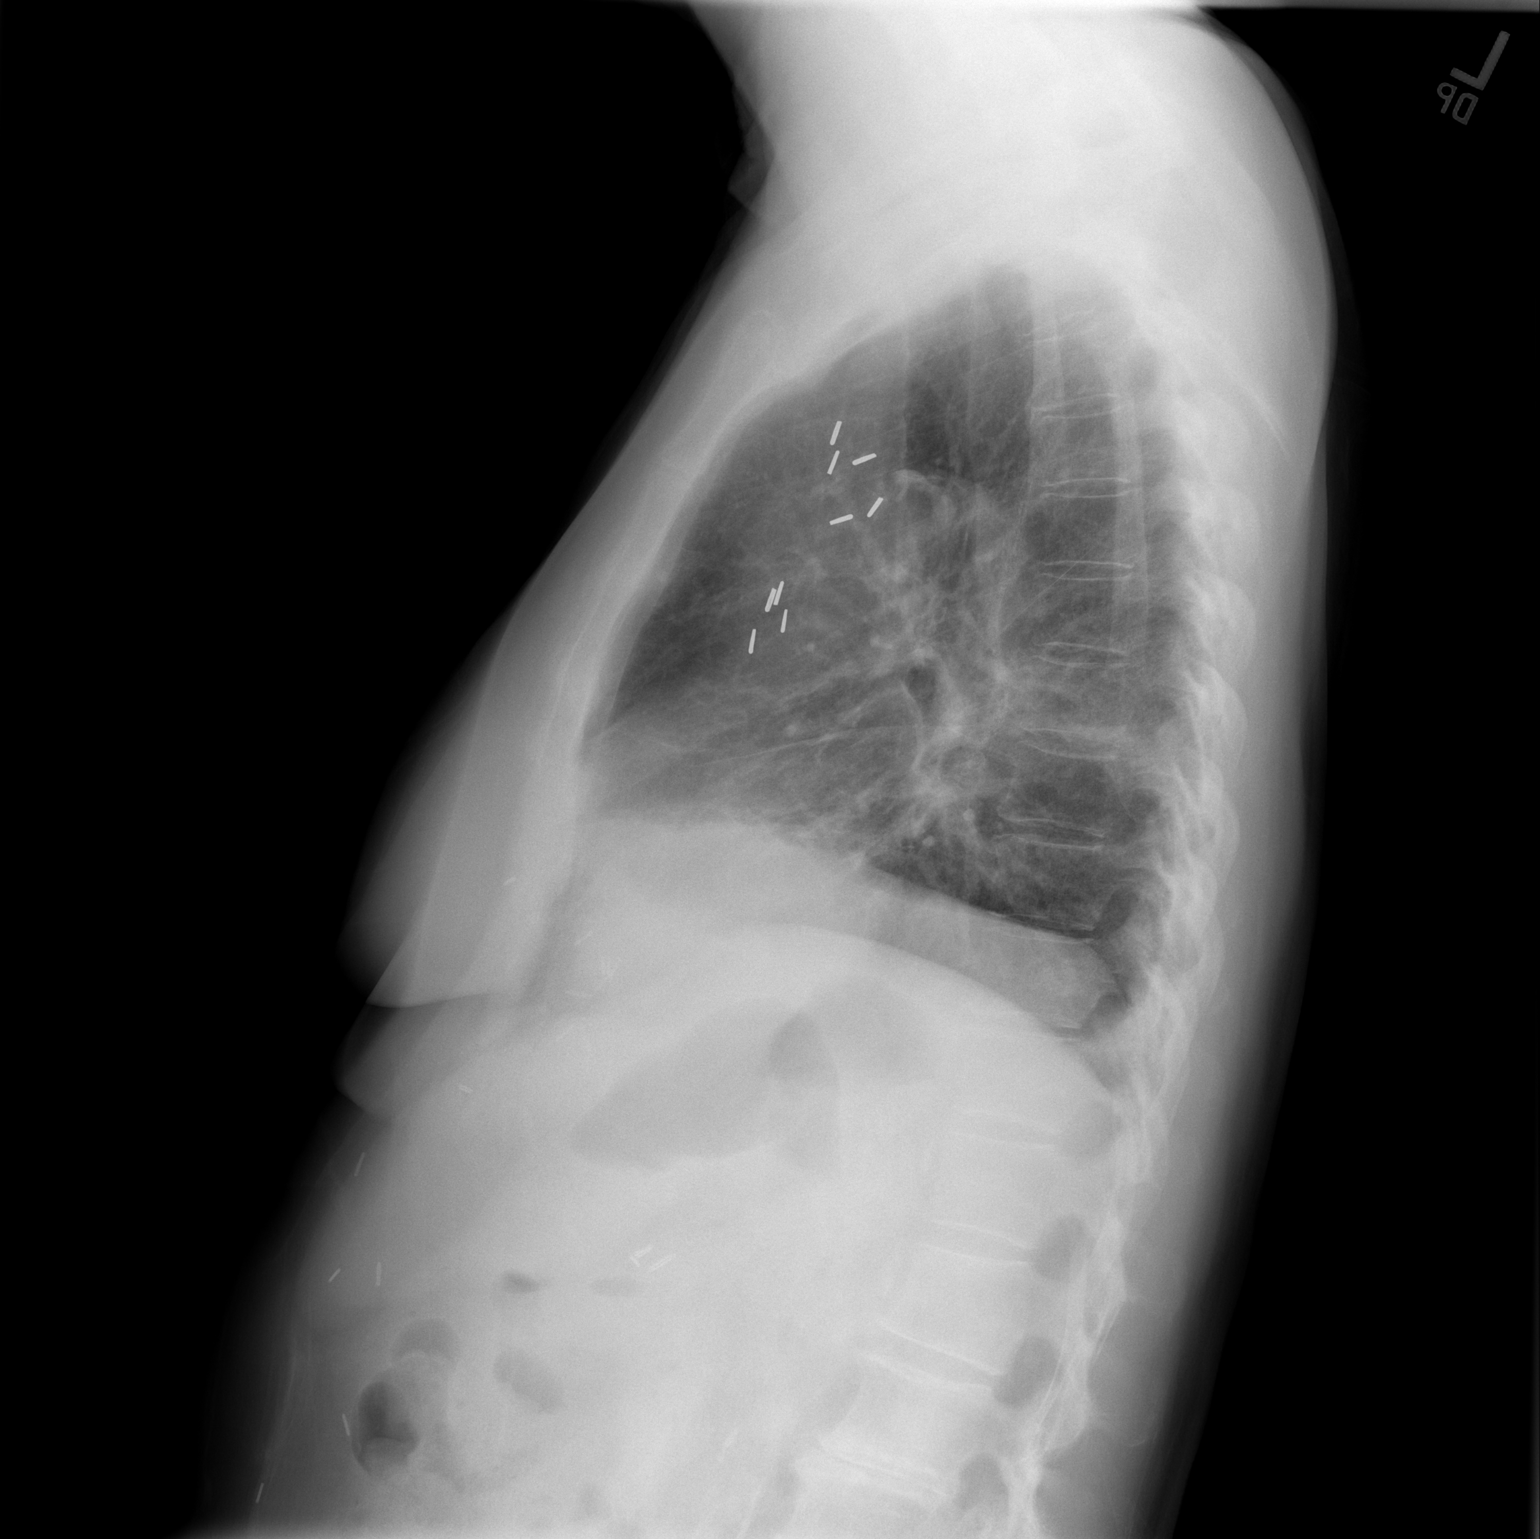

[2 of 2 positions shown; findings below may reference images not displayed]

FINDINGS: The heart size and mediastinal contours are within normal limits. No
pneumothorax is noted. Atherosclerosis of thoracic aorta is noted.
Left lung is clear. Stable right basilar scarring and other
postoperative changes are noted. The visualized skeletal structures
are unremarkable.
IMPRESSION: Stable right basilar scarring and other postoperative changes are
noted. No pneumothorax is noted.

Aortic Atherosclerosis ([id number]).

## 2019-10-07 ENCOUNTER — Encounter: Payer: Self-pay | Admitting: Orthopaedic Surgery

## 2019-10-07 ENCOUNTER — Ambulatory Visit: Payer: Medicare PPO | Admitting: Orthopaedic Surgery

## 2019-10-07 DIAGNOSIS — M79672 Pain in left foot: Secondary | ICD-10-CM | POA: Diagnosis not present

## 2019-10-07 DIAGNOSIS — M79671 Pain in right foot: Secondary | ICD-10-CM

## 2019-10-07 DIAGNOSIS — M7541 Impingement syndrome of right shoulder: Secondary | ICD-10-CM

## 2019-10-07 NOTE — Progress Notes (Signed)
Vanessa Day has been going to physical therapy since I saw her last to work on her feet with mainly her left Achilles tendon but also her right shoulder. She reports that the steroid injection took a little longer to help with her right shoulder but overall though she is made good progress and is doing okay. She is on the generic of Lyrica and she takes that twice a day. She feels like her foot and ankle symptoms are improving as well. We talked extensively about shoewear and potentially going to [facility] to have them look at her shoes and shoe wear. I did send her for ABIs. These did not show any significant blood flow issues or peripheral vascular disease issues.  On exam she still has good palpable pulses in both feet. There is some kinetic tape on the Achilles on the left side but excellent range of motion of both ankles and feet no significant swelling today. Her right shoulder is also moving very well with some mild signs of impingement.  At this point follow-up can be as needed since she is doing well. She can try weaning herself from Lyrica and I gave her a prescription to do so. If she experiences pain again I would have her continue the Lyrica. All question concerns were answered and addressed. If there is any issues at all she knows to let us know.

## 2019-10-11 ENCOUNTER — Encounter: Payer: Self-pay | Admitting: Physical Therapy

## 2019-10-11 ENCOUNTER — Other Ambulatory Visit: Payer: Self-pay

## 2019-10-11 ENCOUNTER — Ambulatory Visit: Payer: Medicare PPO | Admitting: Physical Therapy

## 2019-10-11 DIAGNOSIS — M25572 Pain in left ankle and joints of left foot: Secondary | ICD-10-CM

## 2019-10-11 DIAGNOSIS — M6281 Muscle weakness (generalized): Secondary | ICD-10-CM

## 2019-10-11 DIAGNOSIS — M25571 Pain in right ankle and joints of right foot: Secondary | ICD-10-CM

## 2019-10-11 DIAGNOSIS — R600 Localized edema: Secondary | ICD-10-CM

## 2019-10-11 DIAGNOSIS — R2689 Other abnormalities of gait and mobility: Secondary | ICD-10-CM

## 2019-10-11 NOTE — Therapy (Signed)
[facility] Physical Therapy [address] [place], [place], [address] Phone: [phone]   Fax:  [phone]  Physical Therapy Treatment/Discharge PHYSICAL THERAPY DISCHARGE SUMMARY  Visits from Start of Care: 4  Current functional level related to goals / functional outcomes: See below   Remaining deficits: Still with some pain at end of the day after lots of time on her feet but this should continue to improve with time and HEP.   Education / Equipment: HEP, self KT tape technique  Plan: Patient agrees to discharge.  Patient goals were met. Patient is being discharged due to meeting the stated rehab goals.  ?????       Patient Details  Name: Vanessa Day MRN: [id number] Date of Birth: 28-Jan-1940 Referring Provider (PT): Ninfa Linden, MD   Encounter Date: 10/11/2019   PT End of Session - 10/11/19 1104    Visit Number 4    Number of Visits 12    Date for PT Re-Evaluation 11/04/19    Authorization Type [employer]    PT Start Time 1020    PT Stop Time 1100    PT Time Calculation (min) 40 min    Activity Tolerance Patient tolerated treatment well    Behavior During Therapy [facility] for tasks assessed/performed           Past Medical History:  Diagnosis Date  . Adenocarcinoma of lung, stage 1, right ([place]) 2020  . Anxiety   . Arthritis    back  . Breast cancer ([place]) 2000   Right breast  . Cancer ([place]) 2000   rt breast cancer  . Family history of breast cancer   . GERD (gastroesophageal reflux disease)   . H/O hiatal hernia   . Hyperlipidemia   . Hypertension   . IBS (irritable bowel syndrome)   . Neck pain   . PONV (postoperative nausea and vomiting)   . Pre-diabetes     Past Surgical History:  Procedure Laterality Date  . ABDOMINAL HYSTERECTOMY    . APPENDECTOMY    . BACK SURGERY    . BREAST BIOPSY    . BREAST SURGERY    . CARPOMETACARPEL SUSPENSION PLASTY Left 08/25/2015   Procedure: SUSPENSIONPLASTY LEFT THUMB TRAPEZIUM EXCISION  ABDUCTOR POLLICIS LONGUS TRANSFER;  Surgeon: Daryll Brod, MD;  Location: [place];  Service: Orthopedics;  Laterality: Left;  . CHOLECYSTECTOMY    . EYE SURGERY     cataract right and left eye  . GAS INSERTION  06/23/2011   Procedure: INSERTION OF GAS;  Surgeon: Hayden Pedro, MD;  Location: [place];  Service: Ophthalmology;  Laterality: Left;  SF6  . LOBECTOMY Right 07/26/2018   Procedure: RIGHT LOWER LOBE LOBECTOMY;  Surgeon: Melrose Nakayama, MD;  Location: [place];  Service: Thoracic;  Laterality: Right;  . MASTECTOMY     right breast  . SCLERAL BUCKLE  06/23/2011   Procedure: SCLERAL BUCKLE;  Surgeon: Hayden Pedro, MD;  Location: [place];  Service: Ophthalmology;  Laterality: Left;  . SEGMENTECOMY Right 07/26/2018   Procedure: SEGMENTECTOMY;  Surgeon: Melrose Nakayama, MD;  Location: [place];  Service: Thoracic;  Laterality: Right;  . TONSILLECTOMY    . VIDEO ASSISTED THORACOSCOPY Right 07/26/2018   Procedure: VIDEO ASSISTED THORACOSCOPY;  Surgeon: Melrose Nakayama, MD;  Location: [place];  Service: Thoracic;  Laterality: Right;    There were no vitals filed for this visit.   Subjective Assessment - 10/11/19 1100    Subjective relays she feels ready for  discharge    Pertinent History Lung cancer, anxiety, arthritis, breast cancer, HTN, HLD, back surgery    How long can you stand comfortably? 30 minutes max    How long can you walk comfortably? 10 minutes    Diagnostic tests XR 09/09/19 "Lt and Rt foot show no acute findings.  There is some arthritic changes at the great toe MTP joint and some midfoot arthritic changes.   The arch appears normal."    Patient Stated Goals be able to walk the dogs    Pain Onset More than a month ago              [facility] PT Assessment - 10/11/19 0001      Assessment   Medical Diagnosis R20.0,R20.2 (ICD-10-CM) - Numbness and tingling of foot, bilateral feet pain, Lt achillies tendonitis    Referring Provider (PT) Ninfa Linden, MD       AROM   Overall AROM Comments Lt ankle ROM WNL and now equal to Rt    Left Ankle Dorsiflexion 9      Strength   Overall Strength Comments 5/5 Lt ankle MMT measured in supine                         [facility] Adult PT Treatment/Exercise - 10/11/19 0001      Manual Therapy   Manual therapy comments KT tape to assist Lt achillies one I strip vertical and 1 I strip horizontal      Ankle Exercises: Stretches   Soleus Stretch 3 reps;30 seconds    Soleus Stretch Limitations slantboard    Gastroc Stretch 3 reps;30 seconds    Gastroc Stretch Limitations slantboard      Ankle Exercises: Aerobic   Nustep L4 X 7 min LE only      Ankle Exercises: Standing   Other Standing Ankle Exercises Lt SLS with bilat one fingertip support    Other Standing Ankle Exercises tandem balance                  PT Education - 10/11/19 1104    Education Details self KT tape technique    Person(s) Educated Patient    Methods Explanation;Demonstration;Verbal cues;Tactile cues    Comprehension Verbalized understanding;Returned demonstration               PT Long Term Goals - 10/11/19 1106      PT LONG TERM GOAL #1   Title Pt will be I and compliant with HEP.    Time 6    Period Weeks    Status Achieved      PT LONG TERM GOAL #2   Title Pt will improve overall leg strength to 4+ to 5- tested in sitting to improve function.    Time 6    Period Weeks    Status Achieved      PT LONG TERM GOAL #3   Title Pt wil improve Lt ankle DF ROM to at least 9 degrees to improve functional gait.    Time 6    Period Weeks    Status Achieved      PT LONG TERM GOAL #4   Title Pt will have overall less than 3/10 pain with standing and walking activity at least 30 minutes or with up/down 3 flights of stairs.    Time 6    Period Weeks    Status Achieved  Plan - 10/11/19 1105    Clinical Impression Statement She has met all PT goals and now has good ROM and  strenght in her ankles. Final HEP was reviewed with her. She will be discharged due to progress and she has no further questions or concerns.    Personal Factors and Comorbidities Comorbidity 3+    Comorbidities Lung cancer, anxiety, arthritis, breast cancer, HTN, HLD, back surgery    Examination-Activity Limitations Bend;Carry;Squat;Stairs;Lift;Stand;Locomotion Level    Examination-Participation Restrictions Cleaning;Community Activity;Shop    Stability/Clinical Decision Making Evolving/Moderate complexity    Rehab Potential Good    PT Frequency 2x / week    PT Duration 6 weeks    PT Treatment/Interventions ADLs/Self Care Home Management;Cryotherapy;Electrical Stimulation;Iontophoresis 83m/ml Dexamethasone;Moist Heat;Ultrasound;Traction;Gait training;Stair training;Therapeutic activities;Therapeutic exercise;Balance training;Neuromuscular re-education;Manual techniques;Passive range of motion;Dry needling;Joint Manipulations;Taping    PT Next Visit Plan DC today    PT Home Exercise Plan Access Code: [email or web address] added SLS and tandem balance    Consulted and Agree with Plan of Care Patient           Patient will benefit from skilled therapeutic intervention in order to improve the following deficits and impairments:  Abnormal gait, Decreased activity tolerance, Decreased balance, Decreased mobility, Decreased endurance, Decreased range of motion, Decreased strength, Increased edema, Difficulty walking, Increased fascial restricitons, Increased muscle spasms, Impaired flexibility, Postural dysfunction, Pain  Visit Diagnosis: Pain in left ankle and joints of left foot  Pain in right ankle and joints of right foot  Other abnormalities of gait and mobility  Muscle weakness (generalized)  Localized edema     Problem List Patient Active Problem List   Diagnosis Date Noted  . Adenocarcinoma of lung, stage 1, right ([place] 08/01/2018  . Genetic testing 02/22/2017  . History of breast cancer  02/13/2017  . Family history of breast cancer   . Acute pain of right knee 07/18/2016  . Trochanteric bursitis, right hip 07/18/2016  . Trochanteric bursitis, left hip 05/04/2016  . 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

## 2019-10-15 ENCOUNTER — Encounter: Payer: Medicare PPO | Admitting: Physical Therapy

## 2019-10-16 DIAGNOSIS — F419 Anxiety disorder, unspecified: Secondary | ICD-10-CM | POA: Diagnosis not present

## 2019-10-16 DIAGNOSIS — G629 Polyneuropathy, unspecified: Secondary | ICD-10-CM | POA: Diagnosis not present

## 2019-10-16 DIAGNOSIS — G8929 Other chronic pain: Secondary | ICD-10-CM | POA: Diagnosis not present

## 2019-10-16 DIAGNOSIS — Z809 Family history of malignant neoplasm, unspecified: Secondary | ICD-10-CM | POA: Diagnosis not present

## 2019-10-16 DIAGNOSIS — I10 Essential (primary) hypertension: Secondary | ICD-10-CM | POA: Diagnosis not present

## 2019-10-16 DIAGNOSIS — E785 Hyperlipidemia, unspecified: Secondary | ICD-10-CM | POA: Diagnosis not present

## 2019-10-16 DIAGNOSIS — Z7982 Long term (current) use of aspirin: Secondary | ICD-10-CM | POA: Diagnosis not present

## 2019-10-16 DIAGNOSIS — R197 Diarrhea, unspecified: Secondary | ICD-10-CM | POA: Diagnosis not present

## 2019-10-16 DIAGNOSIS — Z791 Long term (current) use of non-steroidal anti-inflammatories (NSAID): Secondary | ICD-10-CM | POA: Diagnosis not present

## 2019-10-18 ENCOUNTER — Encounter: Payer: Medicare PPO | Admitting: Rehabilitative and Restorative Service Providers"

## 2019-10-22 ENCOUNTER — Encounter: Payer: Medicare PPO | Admitting: Physical Therapy

## 2019-10-23 DIAGNOSIS — E1169 Type 2 diabetes mellitus with other specified complication: Secondary | ICD-10-CM | POA: Diagnosis not present

## 2019-10-23 DIAGNOSIS — E782 Mixed hyperlipidemia: Secondary | ICD-10-CM | POA: Diagnosis not present

## 2019-10-23 DIAGNOSIS — I10 Essential (primary) hypertension: Secondary | ICD-10-CM | POA: Diagnosis not present

## 2019-10-23 DIAGNOSIS — Z23 Encounter for immunization: Secondary | ICD-10-CM | POA: Diagnosis not present

## 2019-10-23 DIAGNOSIS — M7662 Achilles tendinitis, left leg: Secondary | ICD-10-CM | POA: Diagnosis not present

## 2019-10-23 DIAGNOSIS — L309 Dermatitis, unspecified: Secondary | ICD-10-CM | POA: Diagnosis not present

## 2019-10-24 DIAGNOSIS — M7662 Achilles tendinitis, left leg: Secondary | ICD-10-CM | POA: Diagnosis not present

## 2019-10-25 ENCOUNTER — Encounter: Payer: Medicare PPO | Admitting: Physical Therapy

## 2019-11-13 ENCOUNTER — Encounter: Payer: Self-pay | Admitting: Orthopaedic Surgery

## 2019-11-13 ENCOUNTER — Ambulatory Visit: Payer: Medicare PPO | Admitting: Orthopaedic Surgery

## 2019-11-13 VITALS — Ht 63.0 in | Wt 160.0 lb

## 2019-11-13 DIAGNOSIS — M25511 Pain in right shoulder: Secondary | ICD-10-CM | POA: Diagnosis not present

## 2019-11-13 DIAGNOSIS — G8929 Other chronic pain: Secondary | ICD-10-CM | POA: Diagnosis not present

## 2019-11-13 DIAGNOSIS — H35352 Cystoid macular degeneration, left eye: Secondary | ICD-10-CM | POA: Insufficient documentation

## 2019-11-13 DIAGNOSIS — H04129 Dry eye syndrome of unspecified lacrimal gland: Secondary | ICD-10-CM | POA: Insufficient documentation

## 2019-11-13 DIAGNOSIS — H43811 Vitreous degeneration, right eye: Secondary | ICD-10-CM | POA: Insufficient documentation

## 2019-11-13 DIAGNOSIS — Z961 Presence of intraocular lens: Secondary | ICD-10-CM | POA: Insufficient documentation

## 2019-11-13 MED ORDER — METHOCARBAMOL 500 MG PO TABS: 500.0000 mg | ORAL_TABLET | Freq: Two times a day (BID) | ORAL | 0 refills | Status: DC

## 2019-11-13 NOTE — Progress Notes (Signed)
Office Visit Note   Patient: Vanessa Day           Date of Birth: Feb 28, 1940           MRN: [id number] Visit Date: 11/13/2019              Requested by: Vanessa Neer, MD [address] [place],  [place] [address] PCP: Vanessa Neer, MD   Assessment & Plan: Visit Diagnoses:  1. Chronic right shoulder pain     Plan: Due to the fact the patient is diabetic would not recommend a repeat cortisone injection and in fact it is too early even with a nondiabetic given her right shoulder injection on 09/09/2019.  She is given a muscle relaxant that she can take twice daily for the shoulder discomfort.  We could do a subacromial injection in 2 weeks if she continues to have pain.  Questions were encouraged and answered by Vanessa Day myself.  Follow-Up Instructions: Return in about 2 weeks (around 11/27/2019).   Orders:  No orders of the defined types were placed in this encounter.  Meds ordered this encounter  Medications  . methocarbamol (ROBAXIN) 500 MG tablet    Sig: Take 1 tablet (500 mg total) by mouth in the morning and at bedtime. Prn muscle spasm    Dispense:  40 tablet    Refill:  0      Procedures: No procedures performed   Clinical Data: No additional findings.   Subjective: Chief Complaint  Patient presents with  . Right Shoulder - Pain    HPI Vanessa Day is well-known to Vanessa Day service comes in today due to right shoulder pain.  She states the injection on 09/09/2019 did help with her shoulder pain however she is pain in the front porch inside of porch last week and has had increased shoulder pain since that time no new injury.  She has impingement of the right shoulder has done well with injections in the past.  She also been seen for her Achilles tendinitis she states that she had an ultrasound which showed a small tear of the Achilles she was given some nitro patches and is using this in an effort to relieve the pain she is having in the left  Achilles.  Review of Systems See HPI otherwise negative  Objective: Vital Signs: Ht 5\' 3"  (1.6 m)   Wt 160 lb (72.6 kg)   BMI 28.34 kg/m   Physical Exam General: Well-developed well-nourished female no acute distress mood and affect appropriate.  Psych alert and oriented x3 Ortho Exam Good range of motion of the right shoulder. Specialty Comments:  No specialty comments available.  Imaging: No results found.   PMFS History: Patient Active Problem List   Diagnosis Date Noted  . PVD (posterior vitreous detachment), right eye 11/13/2019  . Pseudophakia of both eyes 11/13/2019  . Dry eye syndrome 11/13/2019  . Cystoid macular edema of left eye 11/13/2019  . Adenocarcinoma of lung, stage 1, right ([place]) 08/01/2018  . History of adenomatous polyp of colon 04/11/2017  . Genetic testing 02/22/2017  . History of breast cancer 02/13/2017  . Family history of breast cancer   . Acute pain of right knee 07/18/2016  . Trochanteric bursitis, right hip 07/18/2016  . Trochanteric bursitis, left hip 05/04/2016  . Trigger middle finger of left hand 10/14/2015  . Neck pain 07/29/2015  . Primary osteoarthritis of first carpometacarpal joint of left hand 07/06/2015  . Osteoarthritis of finger of  left hand 07/06/2015  . 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 Adenocarcinoma of lung, stage 1, right ([place]) 2020  . Anxiety   . Arthritis    back  . Breast cancer ([place]) 2000   Right breast  . Cancer ([place]) 2000   rt breast cancer  . Family history of breast cancer   . GERD (gastroesophageal reflux disease)   . H/O hiatal hernia   . Hyperlipidemia   . Hypertension   . IBS (irritable bowel syndrome)   . Neck pain   . PONV (postoperative nausea and vomiting)   . Pre-diabetes     Family History  Problem Relation Age of Onset  . Breast cancer Mother        dx over 99  . Prostate cancer Father        dx late 68s-early 70s    . COPD Father   . Cerebral palsy Brother   . Lung cancer Maternal Uncle        smoker  . Stroke Maternal Grandfather   . Cancer Cousin        NOS  . Breast cancer Daughter 26  . Anesthesia problems Neg Hx   . Hypotension Neg Hx   . Malignant hyperthermia Neg Hx   . Pseudochol deficiency Neg Hx     Past Surgical History:  Procedure Laterality Date  . ABDOMINAL HYSTERECTOMY    . APPENDECTOMY    . BACK SURGERY    . BREAST BIOPSY    . BREAST SURGERY    . CARPOMETACARPEL SUSPENSION PLASTY Left 08/25/2015   Procedure: SUSPENSIONPLASTY LEFT THUMB TRAPEZIUM EXCISION ABDUCTOR POLLICIS LONGUS TRANSFER;  Surgeon: Vanessa Brod, MD;  Location: [place];  Service: Orthopedics;  Laterality: Left;  . CHOLECYSTECTOMY    . EYE SURGERY     cataract right and left eye  . GAS INSERTION  06/23/2011   Procedure: INSERTION OF GAS;  Surgeon: Vanessa Pedro, MD;  Location: [place];  Service: Ophthalmology;  Laterality: Left;  SF6  . LOBECTOMY Right 07/26/2018   Procedure: RIGHT LOWER LOBE LOBECTOMY;  Surgeon: Vanessa Nakayama, MD;  Location: [place];  Service: Thoracic;  Laterality: Right;  . MASTECTOMY     right breast  . SCLERAL BUCKLE  06/23/2011   Procedure: SCLERAL BUCKLE;  Surgeon: Vanessa Pedro, MD;  Location: [place];  Service: Ophthalmology;  Laterality: Left;  . SEGMENTECOMY Right 07/26/2018   Procedure: SEGMENTECTOMY;  Surgeon: Vanessa Nakayama, MD;  Location: [place];  Service: Thoracic;  Laterality: Right;  . TONSILLECTOMY    . VIDEO ASSISTED THORACOSCOPY Right 07/26/2018   Procedure: VIDEO ASSISTED THORACOSCOPY;  Surgeon: Vanessa Nakayama, MD;  [facility];  Service: Thoracic;  Laterality: Right;   Social History   Occupational History  . Not on file  Tobacco Use  . Smoking status: Former Smoker    Packs/day: 1.00    Years: 30.00    Pack years: 30.00    Types: Cigarettes    Quit date: 06/22/2002    Years since quitting: 17.4  . Smokeless tobacco: Never Used   Vaping Use  . Vaping Use: Never used  Substance and Sexual Activity  . Alcohol use: Yes    Comment: rarely  . Drug use: Yes    Types: Flunitrazepam  . Sexual activity: Not Currently

## 2019-11-18 DIAGNOSIS — Z961 Presence of intraocular lens: Secondary | ICD-10-CM | POA: Diagnosis not present

## 2019-11-18 DIAGNOSIS — H524 Presbyopia: Secondary | ICD-10-CM | POA: Diagnosis not present

## 2019-11-18 DIAGNOSIS — E119 Type 2 diabetes mellitus without complications: Secondary | ICD-10-CM | POA: Diagnosis not present

## 2019-11-18 DIAGNOSIS — H04123 Dry eye syndrome of bilateral lacrimal glands: Secondary | ICD-10-CM | POA: Diagnosis not present

## 2019-11-18 DIAGNOSIS — H52203 Unspecified astigmatism, bilateral: Secondary | ICD-10-CM | POA: Diagnosis not present

## 2019-11-18 DIAGNOSIS — M7662 Achilles tendinitis, left leg: Secondary | ICD-10-CM | POA: Diagnosis not present

## 2019-11-27 ENCOUNTER — Encounter: Payer: Self-pay | Admitting: Orthopaedic Surgery

## 2019-11-27 ENCOUNTER — Ambulatory Visit: Payer: Medicare PPO | Admitting: Orthopaedic Surgery

## 2019-11-27 DIAGNOSIS — M25511 Pain in right shoulder: Secondary | ICD-10-CM

## 2019-11-27 DIAGNOSIS — G8929 Other chronic pain: Secondary | ICD-10-CM | POA: Diagnosis not present

## 2019-11-27 MED ORDER — METHYLPREDNISOLONE ACETATE 40 MG/ML IJ SUSP
40.0000 mg | INTRAMUSCULAR | Status: AC | PRN
  Administered 2019-11-27: 40 mg via INTRA_ARTICULAR

## 2019-11-27 MED ORDER — LIDOCAINE HCL 1 % IJ SOLN
3.0000 mL | INTRAMUSCULAR | Status: AC | PRN
  Administered 2019-11-27: 3 mL

## 2019-11-27 NOTE — Progress Notes (Signed)
   Procedure Note  Patient: Vanessa Day             Date of Birth: Jul 15, 1940           MRN: [id number]             Visit Date: 11/27/2019 HPI: Ms. Illescas comes in today requesting a right shoulder injection.  We saw her a few weeks ago with was too early to get any injection.  She returns today.  She said no new injury to the shoulder.  She has chronic right shoulder pain.  Physical exam right shoulder: She has full forward flexion positive impingement sign.  Otherwise fluid motion of the shoulder.  Procedures: Visit Diagnoses:  1. Chronic right shoulder pain     Large Joint Inj: R subacromial bursa on 11/27/2019 9:50 AM Indications: pain Details: 22 G 1.5 in needle, superior approach  Arthrogram: No  Medications: 3 mL lidocaine 1 %; 40 mg methylPREDNISolone acetate 40 MG/ML Outcome: tolerated well, no immediate complications Procedure, treatment alternatives, risks and benefits explained, specific risks discussed. Consent was given by the patient. Immediately prior to procedure a time out was called to verify the correct patient, procedure, equipment, support staff and site/side marked as required. Patient was prepped and draped in the usual sterile fashion.     Plan: She knows to be in the mindful of her glucose levels but has had no previous problems with injections.  She will continue work on range of motion strengthening the shoulder.  Follow-up with [place] as needed needs to wait at least 3 months tween injections in the right shoulder.  She tolerated the injection well today.

## 2019-12-25 DIAGNOSIS — M7662 Achilles tendinitis, left leg: Secondary | ICD-10-CM | POA: Diagnosis not present

## 2019-12-25 DIAGNOSIS — G629 Polyneuropathy, unspecified: Secondary | ICD-10-CM | POA: Diagnosis not present

## 2020-01-22 DIAGNOSIS — L821 Other seborrheic keratosis: Secondary | ICD-10-CM | POA: Diagnosis not present

## 2020-01-22 DIAGNOSIS — L989 Disorder of the skin and subcutaneous tissue, unspecified: Secondary | ICD-10-CM | POA: Diagnosis not present

## 2020-01-30 DIAGNOSIS — M7662 Achilles tendinitis, left leg: Secondary | ICD-10-CM | POA: Diagnosis not present

## 2020-03-20 ENCOUNTER — Inpatient Hospital Stay: Payer: Medicare PPO | Attending: Internal Medicine

## 2020-03-20 ENCOUNTER — Other Ambulatory Visit: Payer: Self-pay

## 2020-03-20 ENCOUNTER — Ambulatory Visit (HOSPITAL_COMMUNITY)
Admission: RE | Admit: 2020-03-20 | Discharge: 2020-03-20 | Disposition: A | Discharge status: Home / Self Care | Payer: Medicare PPO | Source: Ambulatory Visit | Attending: Internal Medicine | Admitting: Internal Medicine

## 2020-03-20 DIAGNOSIS — J984 Other disorders of lung: Secondary | ICD-10-CM | POA: Diagnosis not present

## 2020-03-20 DIAGNOSIS — Z9011 Acquired absence of right breast and nipple: Secondary | ICD-10-CM | POA: Diagnosis not present

## 2020-03-20 DIAGNOSIS — K219 Gastro-esophageal reflux disease without esophagitis: Secondary | ICD-10-CM | POA: Diagnosis not present

## 2020-03-20 DIAGNOSIS — Z902 Acquired absence of lung [part of]: Secondary | ICD-10-CM | POA: Diagnosis not present

## 2020-03-20 DIAGNOSIS — I2510 Atherosclerotic heart disease of native coronary artery without angina pectoris: Secondary | ICD-10-CM | POA: Diagnosis not present

## 2020-03-20 DIAGNOSIS — Z803 Family history of malignant neoplasm of breast: Secondary | ICD-10-CM | POA: Diagnosis not present

## 2020-03-20 DIAGNOSIS — Z7982 Long term (current) use of aspirin: Secondary | ICD-10-CM | POA: Insufficient documentation

## 2020-03-20 DIAGNOSIS — Z90710 Acquired absence of both cervix and uterus: Secondary | ICD-10-CM | POA: Diagnosis not present

## 2020-03-20 DIAGNOSIS — E785 Hyperlipidemia, unspecified: Secondary | ICD-10-CM | POA: Diagnosis not present

## 2020-03-20 DIAGNOSIS — C3490 Malignant neoplasm of unspecified part of unspecified bronchus or lung: Secondary | ICD-10-CM | POA: Diagnosis not present

## 2020-03-20 DIAGNOSIS — I10 Essential (primary) hypertension: Secondary | ICD-10-CM | POA: Diagnosis not present

## 2020-03-20 DIAGNOSIS — C3412 Malignant neoplasm of upper lobe, left bronchus or lung: Secondary | ICD-10-CM | POA: Diagnosis not present

## 2020-03-20 DIAGNOSIS — K589 Irritable bowel syndrome without diarrhea: Secondary | ICD-10-CM | POA: Insufficient documentation

## 2020-03-20 DIAGNOSIS — Z79899 Other long term (current) drug therapy: Secondary | ICD-10-CM | POA: Insufficient documentation

## 2020-03-20 DIAGNOSIS — F419 Anxiety disorder, unspecified: Secondary | ICD-10-CM | POA: Insufficient documentation

## 2020-03-20 DIAGNOSIS — Z853 Personal history of malignant neoplasm of breast: Secondary | ICD-10-CM | POA: Insufficient documentation

## 2020-03-20 DIAGNOSIS — J8410 Pulmonary fibrosis, unspecified: Secondary | ICD-10-CM | POA: Diagnosis not present

## 2020-03-20 LAB — CBC WITH DIFFERENTIAL (CANCER CENTER ONLY)
Abs Immature Granulocytes: 0.03 10*3/uL (ref 0.00–0.07)
Basophils Absolute: 0 10*3/uL (ref 0.0–0.1)
Basophils Relative: 1 %
Eosinophils Absolute: 0.1 10*3/uL (ref 0.0–0.5)
Eosinophils Relative: 1 %
HCT: 40.6 % (ref 36.0–46.0)
Hemoglobin: 13.9 g/dL (ref 12.0–15.0)
Immature Granulocytes: 0 %
Lymphocytes Relative: 32 %
Lymphs Abs: 2.7 10*3/uL (ref 0.7–4.0)
MCH: 29.6 pg (ref 26.0–34.0)
MCHC: 34.2 g/dL (ref 30.0–36.0)
MCV: 86.6 fL (ref 80.0–100.0)
Monocytes Absolute: 0.7 10*3/uL (ref 0.1–1.0)
Monocytes Relative: 8 %
Neutro Abs: 5.1 10*3/uL (ref 1.7–7.7)
Neutrophils Relative %: 58 %
Platelet Count: 287 10*3/uL (ref 150–400)
RBC: 4.69 MIL/uL (ref 3.87–5.11)
RDW: 13.2 % (ref 11.5–15.5)
WBC Count: 8.6 10*3/uL (ref 4.0–10.5)
nRBC: 0 % (ref 0.0–0.2)

## 2020-03-20 LAB — CMP (CANCER CENTER ONLY)
ALT: 19 U/L (ref 0–44)
AST: 15 U/L (ref 15–41)
Albumin: 3.7 g/dL (ref 3.5–5.0)
Alkaline Phosphatase: 69 U/L (ref 38–126)
Anion gap: 8 (ref 5–15)
BUN: 33 mg/dL — ABNORMAL HIGH (ref 8–23)
CO2: 27 mmol/L (ref 22–32)
Calcium: 9.7 mg/dL (ref 8.9–10.3)
Chloride: 104 mmol/L (ref 98–111)
Creatinine: 1.09 mg/dL — ABNORMAL HIGH (ref 0.44–1.00)
GFR, Estimated: 51 mL/min — ABNORMAL LOW (ref >60)
Glucose, Bld: 126 mg/dL — ABNORMAL HIGH (ref 70–99)
Potassium: 4.2 mmol/L (ref 3.5–5.1)
Sodium: 139 mmol/L (ref 135–145)
Total Bilirubin: 1.1 mg/dL (ref 0.3–1.2)
Total Protein: 7.1 g/dL (ref 6.5–8.1)

## 2020-03-20 MED ORDER — IOHEXOL 300 MG/ML  SOLN
75.0000 mL | Freq: Once | INTRAMUSCULAR | Status: AC | PRN
  Administered 2020-03-20: 75 mL via INTRAVENOUS

## 2020-03-23 ENCOUNTER — Other Ambulatory Visit: Payer: Self-pay

## 2020-03-23 ENCOUNTER — Encounter: Payer: Self-pay | Admitting: Internal Medicine

## 2020-03-23 ENCOUNTER — Inpatient Hospital Stay: Payer: Medicare PPO | Admitting: Internal Medicine

## 2020-03-23 VITALS — BP 110/67 | HR 80 | Temp 97.6°F | Resp 16 | Ht 63.0 in | Wt 149.8 lb

## 2020-03-23 DIAGNOSIS — Z85118 Personal history of other malignant neoplasm of bronchus and lung: Secondary | ICD-10-CM

## 2020-03-23 DIAGNOSIS — Z902 Acquired absence of lung [part of]: Secondary | ICD-10-CM

## 2020-03-23 DIAGNOSIS — C3490 Malignant neoplasm of unspecified part of unspecified bronchus or lung: Secondary | ICD-10-CM | POA: Diagnosis not present

## 2020-03-23 DIAGNOSIS — K589 Irritable bowel syndrome without diarrhea: Secondary | ICD-10-CM | POA: Diagnosis not present

## 2020-03-23 DIAGNOSIS — C3491 Malignant neoplasm of unspecified part of right bronchus or lung: Secondary | ICD-10-CM

## 2020-03-23 DIAGNOSIS — F419 Anxiety disorder, unspecified: Secondary | ICD-10-CM | POA: Diagnosis not present

## 2020-03-23 DIAGNOSIS — E785 Hyperlipidemia, unspecified: Secondary | ICD-10-CM | POA: Diagnosis not present

## 2020-03-23 DIAGNOSIS — K219 Gastro-esophageal reflux disease without esophagitis: Secondary | ICD-10-CM | POA: Diagnosis not present

## 2020-03-23 DIAGNOSIS — Z7982 Long term (current) use of aspirin: Secondary | ICD-10-CM | POA: Diagnosis not present

## 2020-03-23 DIAGNOSIS — C3412 Malignant neoplasm of upper lobe, left bronchus or lung: Secondary | ICD-10-CM | POA: Diagnosis not present

## 2020-03-23 DIAGNOSIS — Z853 Personal history of malignant neoplasm of breast: Secondary | ICD-10-CM | POA: Diagnosis not present

## 2020-03-23 DIAGNOSIS — I10 Essential (primary) hypertension: Secondary | ICD-10-CM | POA: Diagnosis not present

## 2020-03-23 DIAGNOSIS — Z79899 Other long term (current) drug therapy: Secondary | ICD-10-CM | POA: Diagnosis not present

## 2020-03-23 NOTE — Progress Notes (Signed)
[facility] [phone]   [phone]  OFFICE PROGRESS NOTE  Vanessa Neer, MD [address] [place] [address]  DIAGNOSIS: T1a (T1c, N0, M0) non-small cell lung cancer, adenocarcinoma presented with right lower lobe lung nodule in June 2020.  PRIOR THERAPY: Right video-assisted thoracoscopy, Wedge resection right lower lobe nodule, Thoracoscopic right lower lobectomy, Lymph node dissection on July 26, 2018 under the care of Dr. Roxan Hockey  CURRENT THERAPY: Observation.  INTERVAL HISTORY: Vanessa Day 80 y.o. female returns to the clinic today for follow-up visit.  The patient is feeling fine today with no concerning complaints except for occasional wheezing.  She denied having any chest pain, shortness of breath, cough or hemoptysis.  She denied having any fever or chills.  She has no nausea, vomiting, diarrhea or constipation.  She has no headache or visual changes.  She is here today for evaluation and repeat CT scan of the chest for restaging of her disease.  MEDICAL HISTORY: Past Medical History:  Diagnosis Date  . Adenocarcinoma of lung, stage 1, right ([place]) 2020  . Anxiety   . Arthritis    back  . Breast cancer ([place]) 2000   Right breast  . Cancer ([place]) 2000   rt breast cancer  . Family history of breast cancer   . GERD (gastroesophageal reflux disease)   . H/O hiatal hernia   . Hyperlipidemia   . Hypertension   . IBS (irritable bowel syndrome)   . Neck pain   . PONV (postoperative nausea and vomiting)   . Pre-diabetes     ALLERGIES:  is allergic to hydrocodone, codeine, statins, and sulfa antibiotics.  MEDICATIONS:  Current Outpatient Medications  Medication Sig Dispense Refill  . acetaminophen (TYLENOL) 500 MG tablet Take 1 tablet (500 mg total) by mouth every 6 (six) hours as needed for mild pain. 30 tablet 0  . aspirin 81 MG tablet Take 81 mg by mouth daily.    . celecoxib (CELEBREX) 200 MG capsule  Take 200 mg by mouth 2 (two) times daily.    . cholecalciferol (VITAMIN D3) 25 MCG (1000 UT) tablet Take 1,000 Units by mouth daily.    Marland Kitchen ezetimibe (ZETIA) 10 MG tablet Take 10 mg by mouth daily.    Marland Kitchen guaiFENesin (MUCINEX) 600 MG 12 hr tablet Take 1 tablet (600 mg total) by mouth 2 (two) times daily as needed for cough or to loosen phlegm.    . hydrochlorothiazide (HYDRODIURIL) 25 MG tablet Take 25 mg by mouth daily.    Marland Kitchen LORazepam (ATIVAN) 1 MG tablet Take 1-2 mg by mouth at bedtime.     Marland Kitchen losartan (COZAAR) 100 MG tablet Take 100 mg by mouth daily.    Marnee Spring Omega-3 Krill Oil 500 MG CAPS Take 500 mg by mouth daily.    Marland Kitchen oxymetazoline (AFRIN) 0.05 % nasal spray Place 1 spray into both nostrils at bedtime as needed for congestion.    Vladimir Faster Glycol-Propyl Glycol (SYSTANE OP) Place 1 drop into both eyes 2 (two) times daily as needed (dry eyes).    . pregabalin (LYRICA) 50 MG capsule Take 50 mg by mouth 2 (two) times daily.     . vitamin E 1000 UNIT capsule Take 1 tablet by mouth daily.     No current facility-administered medications for this visit.    SURGICAL HISTORY:  Past Surgical History:  Procedure Laterality Date  . ABDOMINAL HYSTERECTOMY    . APPENDECTOMY    .  BACK SURGERY    . BREAST BIOPSY    . BREAST SURGERY    . CARPOMETACARPEL SUSPENSION PLASTY Left 08/25/2015   Procedure: SUSPENSIONPLASTY LEFT THUMB TRAPEZIUM EXCISION ABDUCTOR POLLICIS LONGUS TRANSFER;  Surgeon: Daryll Brod, MD;  Location: [place];  Service: Orthopedics;  Laterality: Left;  . CHOLECYSTECTOMY    . EYE SURGERY     cataract right and left eye  . GAS INSERTION  06/23/2011   Procedure: INSERTION OF GAS;  Surgeon: Hayden Pedro, MD;  Location: [facility];  Service: Ophthalmology;  Laterality: Left;  SF6  . LOBECTOMY Right 07/26/2018   Procedure: RIGHT LOWER LOBE LOBECTOMY;  Surgeon: Melrose Nakayama, MD;  Location: [place];  Service: Thoracic;  Laterality: Right;  . MASTECTOMY     right  breast  . SCLERAL BUCKLE  06/23/2011   Procedure: SCLERAL BUCKLE;  Surgeon: Hayden Pedro, MD;  Location: [facility];  Service: Ophthalmology;  Laterality: Left;  . SEGMENTECOMY Right 07/26/2018   Procedure: SEGMENTECTOMY;  Surgeon: Melrose Nakayama, MD;  Location: [facility];  Service: Thoracic;  Laterality: Right;  . TONSILLECTOMY    . VIDEO ASSISTED THORACOSCOPY Right 07/26/2018   Procedure: VIDEO ASSISTED THORACOSCOPY;  Surgeon: Melrose Nakayama, MD;  Location: [facility] OR;  Service: Thoracic;  Laterality: Right;    REVIEW OF SYSTEMS:  A comprehensive review of systems was negative.   PHYSICAL EXAMINATION: General appearance: alert, cooperative and no distress Head: Normocephalic, without obvious abnormality, atraumatic Neck: no adenopathy, no JVD, supple, symmetrical, trachea midline and thyroid not enlarged, symmetric, no tenderness/mass/nodules Lymph nodes: Cervical, supraclavicular, and axillary nodes normal. Resp: clear to auscultation bilaterally Back: symmetric, no curvature. ROM normal. No CVA tenderness. Cardio: regular rate and rhythm, S1, S2 normal, no murmur, click, rub or gallop GI: soft, non-tender; bowel sounds normal; no masses,  no organomegaly Extremities: extremities normal, atraumatic, no cyanosis or edema  ECOG PERFORMANCE STATUS: 0 - Asymptomatic   Blood pressure 110/67, pulse 80, temperature 97.6 F (36.4 C), temperature source Tympanic, resp. rate 16, height 5\' 3"  (1.6 m), weight 149 lb 12.8 oz (67.9 kg), SpO2 98 %.  LABORATORY DATA: Lab Results  Component Value Date   WBC 8.6 03/20/2020   HGB 13.9 03/20/2020   HCT 40.6 03/20/2020   MCV 86.6 03/20/2020   PLT 287 03/20/2020      Chemistry      Component Value Date/Time   NA 139 03/20/2020 1101   K 4.2 03/20/2020 1101   CL 104 03/20/2020 1101   CO2 27 03/20/2020 1101   BUN 33 (H) 03/20/2020 1101   CREATININE 1.09 (H) 03/20/2020 1101      Component Value Date/Time   CALCIUM 9.7 03/20/2020 1101    ALKPHOS 69 03/20/2020 1101   AST 15 03/20/2020 1101   ALT 19 03/20/2020 1101   BILITOT 1.1 03/20/2020 1101       RADIOGRAPHIC STUDIES: CT Chest W Contrast  Result Date: 03/21/2020 CLINICAL DATA:  Non-small-cell lung cancer.  Restaging. EXAM: CT CHEST WITH CONTRAST TECHNIQUE: Multidetector CT imaging of the chest was performed during intravenous contrast administration. CONTRAST:  76mL OMNIPAQUE IOHEXOL 300 MG/ML  SOLN COMPARISON:  09/16/2019. FINDINGS: Cardiovascular: The heart size is normal. No substantial pericardial effusion. Coronary artery calcification is evident. Atherosclerotic calcification is noted in the wall of the thoracic aorta. Mediastinum/Nodes: No mediastinal lymphadenopathy. There is no hilar lymphadenopathy. The esophagus has normal imaging features. There is no axillary lymphadenopathy. Lungs/Pleura: Volume loss right hemithorax compatible with right  lower lobectomy. Apical scarring again noted on the left, stable peripheral calcified granuloma in the right lung again noted. No suspicious pulmonary nodule or mass. No focal airspace consolidation. No pleural effusion. Upper Abdomen: Right upper pole renal cyst again noted. Epigastric midline ventral hernia contains only fat, stable. Musculoskeletal: No worrisome lytic or sclerotic osseous abnormality. IMPRESSION: 1. Stable exam. No new or progressive findings to suggest recurrent or metastatic disease. 2. Status post right lower lobectomy. 3. Aortic Atherosclerosis (ICD10-I70.0). Electronically Signed   By: Misty Stanley M.D.   On: 03/21/2020 10:06    ASSESSMENT AND PLAN: This is a very pleasant 80 years old white female recently diagnosed with a stage IA (T1c, N0, M0) non-small cell lung cancer, adenocarcinoma status post right lower lobectomy with lymph node dissection in July 2020. The patient is currently on observation and she is feeling fine today with no concerning complaints. She had repeat CT scan of the chest performed  recently.  I personally and independently reviewed the scans and discussed the results with the patient today. Her scan showed no concerning findings for disease recurrence or metastasis. I recommended for her to continue on observation with repeat CT scan of the chest in 1 year. She was advised to call immediately if she has any concerning symptoms in the interval. The patient voices understanding of current disease status and treatment options and is in agreement with the current care plan. All questions were answered. The patient knows to call the clinic with any problems, questions or concerns. We can certainly see the patient much sooner if necessary.  Disclaimer: This note was dictated with voice recognition software. Similar sounding words can inadvertently be transcribed and may not be corrected upon review.

## 2020-03-24 ENCOUNTER — Telehealth: Payer: Self-pay | Admitting: Internal Medicine

## 2020-03-24 NOTE — Telephone Encounter (Signed)
Scheduled per 2/28 los. Called and spoke with pt, pt confirmed that she would get her next appts off mychart

## 2020-04-29 DIAGNOSIS — M159 Polyosteoarthritis, unspecified: Secondary | ICD-10-CM | POA: Diagnosis not present

## 2020-04-29 DIAGNOSIS — J309 Allergic rhinitis, unspecified: Secondary | ICD-10-CM | POA: Diagnosis not present

## 2020-04-29 DIAGNOSIS — F322 Major depressive disorder, single episode, severe without psychotic features: Secondary | ICD-10-CM | POA: Diagnosis not present

## 2020-04-29 DIAGNOSIS — C3491 Malignant neoplasm of unspecified part of right bronchus or lung: Secondary | ICD-10-CM | POA: Diagnosis not present

## 2020-04-29 DIAGNOSIS — Z0000 Encounter for general adult medical examination without abnormal findings: Secondary | ICD-10-CM | POA: Diagnosis not present

## 2020-04-29 DIAGNOSIS — I10 Essential (primary) hypertension: Secondary | ICD-10-CM | POA: Diagnosis not present

## 2020-04-29 DIAGNOSIS — K589 Irritable bowel syndrome without diarrhea: Secondary | ICD-10-CM | POA: Diagnosis not present

## 2020-04-29 DIAGNOSIS — E782 Mixed hyperlipidemia: Secondary | ICD-10-CM | POA: Diagnosis not present

## 2020-04-29 DIAGNOSIS — E1169 Type 2 diabetes mellitus with other specified complication: Secondary | ICD-10-CM | POA: Diagnosis not present

## 2020-06-03 ENCOUNTER — Other Ambulatory Visit: Payer: Self-pay | Admitting: Family Medicine

## 2020-06-03 DIAGNOSIS — Z1231 Encounter for screening mammogram for malignant neoplasm of breast: Secondary | ICD-10-CM

## 2020-06-03 DIAGNOSIS — R300 Dysuria: Secondary | ICD-10-CM | POA: Diagnosis not present

## 2020-07-07 ENCOUNTER — Encounter: Payer: Self-pay | Admitting: Interventional Cardiology

## 2020-07-07 DIAGNOSIS — E0590 Thyrotoxicosis, unspecified without thyrotoxic crisis or storm: Secondary | ICD-10-CM | POA: Diagnosis not present

## 2020-07-07 DIAGNOSIS — J309 Allergic rhinitis, unspecified: Secondary | ICD-10-CM | POA: Diagnosis not present

## 2020-07-07 DIAGNOSIS — R946 Abnormal results of thyroid function studies: Secondary | ICD-10-CM | POA: Diagnosis not present

## 2020-07-07 DIAGNOSIS — R000 Tachycardia, unspecified: Secondary | ICD-10-CM | POA: Diagnosis not present

## 2020-07-29 DIAGNOSIS — R946 Abnormal results of thyroid function studies: Secondary | ICD-10-CM | POA: Diagnosis not present

## 2020-07-29 DIAGNOSIS — Z85118 Personal history of other malignant neoplasm of bronchus and lung: Secondary | ICD-10-CM | POA: Diagnosis not present

## 2020-07-29 DIAGNOSIS — E0590 Thyrotoxicosis, unspecified without thyrotoxic crisis or storm: Secondary | ICD-10-CM | POA: Diagnosis not present

## 2020-07-29 DIAGNOSIS — I480 Paroxysmal atrial fibrillation: Secondary | ICD-10-CM | POA: Diagnosis not present

## 2020-07-29 DIAGNOSIS — Z853 Personal history of malignant neoplasm of breast: Secondary | ICD-10-CM | POA: Diagnosis not present

## 2020-07-29 DIAGNOSIS — Z6826 Body mass index (BMI) 26.0-26.9, adult: Secondary | ICD-10-CM | POA: Diagnosis not present

## 2020-07-30 ENCOUNTER — Ambulatory Visit
Admission: RE | Admit: 2020-07-30 | Discharge: 2020-07-30 | Disposition: A | Discharge status: Home / Self Care | Payer: Medicare PPO | Source: Ambulatory Visit | Attending: Family Medicine | Admitting: Family Medicine

## 2020-07-30 ENCOUNTER — Other Ambulatory Visit: Payer: Self-pay

## 2020-07-30 DIAGNOSIS — Z1231 Encounter for screening mammogram for malignant neoplasm of breast: Secondary | ICD-10-CM | POA: Diagnosis not present

## 2020-08-01 NOTE — Progress Notes (Addendum)
Cardiology Office Note:    Date:  08/03/2020   ID:  Vanessa Day, DOB 12/16/1940, MRN [id number]  PCP:  [name], MD  Cardiologist:  None   Referring MD: [name], MD   Chief Complaint  Patient presents with   Atrial Fibrillation   Follow-up    Hyper thyroidism     History of Present Illness:    Vanessa Day is a 80 y.o. female with a hx of new onset atrial fibrillation and recently diagnosed hyperthyroidism.   Vanessa Day is accompanied by her husband.  She has a pleasant lady who appears younger than her stated age.  2 months ago she began feeling fluttering in her chest.  She also notes fatigue.  She says she sleeps well.  Her husband states that she snores.  No prior history of atrial fibrillation.  She began picking up heart rhythm disturbance based upon her apple watch which raises a question of atrial fibrillation.  A 24-hour Holter monitor was performed at [facility] and did demonstrate brief runs of atrial fibrillation.  I have no idea about the overall burden of atrial fibrillation.  She has been started on Eliquis and has a Chads Vasc Score of 4.  She is seeing Dr. Loni Muse. at [facility] who is managing the new diagnosis of hyperthyroidism.  Metoprolol succinate and methimazole were recently prescribed.  Past Medical History:  Diagnosis Date   Adenocarcinoma of lung, stage 1, right ([place]) 2020   Anxiety    Arthritis    back   Breast cancer ([place]) 2000   Right breast   Cancer ([place]) 2000   rt breast cancer   Family history of breast cancer    GERD (gastroesophageal reflux disease)    H/O hiatal hernia    Hyperlipidemia    Hypertension    IBS (irritable bowel syndrome)    Neck pain    PONV (postoperative nausea and vomiting)    Pre-diabetes     Past Surgical History:  Procedure Laterality Date   ABDOMINAL HYSTERECTOMY     APPENDECTOMY     BACK SURGERY     BREAST BIOPSY     BREAST SURGERY     CARPOMETACARPEL SUSPENSION PLASTY  Left 08/25/2015   Procedure: SUSPENSIONPLASTY LEFT THUMB TRAPEZIUM EXCISION ABDUCTOR POLLICIS LONGUS TRANSFER;  Surgeon: Daryll Brod, MD;  Location: [place];  Service: Orthopedics;  Laterality: Left;   CHOLECYSTECTOMY     EYE SURGERY     cataract right and left eye   GAS INSERTION  06/23/2011   Procedure: INSERTION OF GAS;  Surgeon: Hayden Pedro, MD;  Location: [place];  Service: Ophthalmology;  Laterality: Left;  SF6   LOBECTOMY Right 07/26/2018   Procedure: RIGHT LOWER LOBE LOBECTOMY;  Surgeon: Melrose Nakayama, MD;  Location: [place];  Service: Thoracic;  Laterality: Right;   MASTECTOMY     right breast   SCLERAL BUCKLE  06/23/2011   Procedure: SCLERAL BUCKLE;  Surgeon: Hayden Pedro, MD;  Location: [place];  Service: Ophthalmology;  Laterality: Left;   SEGMENTECOMY Right 07/26/2018   Procedure: SEGMENTECTOMY;  Surgeon: Melrose Nakayama, MD;  Location: [place];  Service: Thoracic;  Laterality: Right;   TONSILLECTOMY     VIDEO ASSISTED THORACOSCOPY Right 07/26/2018   Procedure: VIDEO ASSISTED THORACOSCOPY;  Surgeon: Melrose Nakayama, MD;  Location: [facility] OR;  Service: Thoracic;  Laterality: Right;    Current Medications: Current Meds  Medication Sig   acetaminophen (TYLENOL) 500 MG  tablet Take 1 tablet (500 mg total) by mouth every 6 (six) hours as needed for mild pain.   apixaban (ELIQUIS) 5 MG TABS tablet Take 5 mg by mouth 2 (two) times daily.   azelastine (ASTELIN) 0.1 % nasal spray Place 1 spray into both nostrils 2 (two) times daily. Use in each nostril as directed   calcium carbonate (OSCAL) 1500 (600 Ca) MG TABS tablet Take 600 mg of elemental calcium by mouth daily.   cholecalciferol (VITAMIN D3) 25 MCG (1000 UT) tablet Take 1,000 Units by mouth daily.   ezetimibe (ZETIA) 10 MG tablet Take 10 mg by mouth daily.   guaiFENesin (MUCINEX) 600 MG 12 hr tablet Take 1 tablet (600 mg total) by mouth 2 (two) times daily as needed for cough or to loosen phlegm.    hydrochlorothiazide (MICROZIDE) 12.5 MG capsule Take 1 capsule (12.5 mg total) by mouth daily.   LORazepam (ATIVAN) 1 MG tablet Take 1-2 mg by mouth at bedtime.    losartan (COZAAR) 100 MG tablet Take 100 mg by mouth daily.   MegaRed Omega-3 Krill Oil 500 MG CAPS Take 500 mg by mouth daily.   methimazole (TAPAZOLE) 10 MG tablet Take 20 mg by mouth daily.   Polyethyl Glycol-Propyl Glycol (SYSTANE OP) Place 1 drop into both eyes 2 (two) times daily as needed (dry eyes).   pregabalin (LYRICA) 50 MG capsule Take 50 mg by mouth 2 (two) times daily.    sertraline (ZOLOFT) 50 MG tablet Take 50 mg by mouth daily.   vitamin E 1000 UNIT capsule Take 1 tablet by mouth daily.   [DISCONTINUED] hydrochlorothiazide (HYDRODIURIL) 25 MG tablet Take 25 mg by mouth daily.     Allergies:   Hydrocodone, Codeine, Statins, and Sulfa antibiotics   Social History   Socioeconomic History   Marital status: Married    Spouse name: Not on file   Number of children: Not on file   Years of education: Not on file   Highest education level: Not on file  Occupational History   Not on file  Tobacco Use   Smoking status: Former    Packs/day: 1.00    Years: 30.00    Pack years: 30.00    Types: Cigarettes    Quit date: 06/22/2002    Years since quitting: 18.1   Smokeless tobacco: Never  Vaping Use   Vaping Use: Never used  Substance and Sexual Activity   Alcohol use: Yes    Comment: rarely   Drug use: Yes    Types: Flunitrazepam   Sexual activity: Not Currently  Other Topics Concern   Not on file  Social History Narrative   Not on file   Social Determinants of Health   Financial Resource Strain: Not on file  Food Insecurity: Not on file  Transportation Needs: Not on file  Physical Activity: Not on file  Stress: Not on file  Social Connections: Not on file     Family History: The patient's family history includes Breast cancer in her mother; Breast cancer (age of onset: 66) in her daughter; COPD in  her father; Cancer in her cousin; Cerebral palsy in her brother; Lung cancer in her maternal uncle; Prostate cancer in her father; Stroke in her maternal grandfather. There is no history of Anesthesia problems, Hypotension, Malignant hyperthermia, or Pseudochol deficiency.  ROS:   Please see the history of present illness.    History of lung cancer 2020 with right lower lobe resection.  History of breast cancer 2000  status post right lumpectomy.  All other systems reviewed and are negative.  EKGs/Labs/Other Studies Reviewed:    The following studies were reviewed today:  CHEST CT 03/20/2020: Cardiovascular: The heart size is normal. No substantial pericardial effusion. Coronary artery calcification is evident. Atherosclerotic calcification is noted in the wall of the thoracic aorta.  EKG:  EKG Prior tracing from 07/23/2018 reveals SB with low voltage, and prolonged QT.  When compared to the prior tracing from 2020 the QT prolongation is new.  Recent Labs: 03/20/2020: ALT 19; BUN 33; Creatinine 1.09; Hemoglobin 13.9; Platelet Count 287; Potassium 4.2; Sodium 139  Recent Lipid Panel No results found for: CHOL, TRIG, HDL, CHOLHDL, VLDL, LDLCALC, LDLDIRECT  Physical Exam:    VS:  BP (!) 100/58   Pulse 70   Ht 5\' 3"  (1.6 m)   Wt 147 lb 6.4 oz (66.9 kg)   SpO2 97%   BMI 26.11 kg/m     Wt Readings from Last 3 Encounters:  08/03/20 147 lb 6.4 oz (66.9 kg)  03/23/20 149 lb 12.8 oz (67.9 kg)  11/13/19 160 lb (72.6 kg)     GEN: Healthy-appearing. No acute distress HEENT: Normal NECK: No JVD. LYMPHATICS: No lymphadenopathy CARDIAC: No murmur. RRR no gallop, or edema. VASCULAR:  Normal Pulses. No bruits. RESPIRATORY:  Clear to auscultation without rales, wheezing or rhonchi  ABDOMEN: Soft, non-tender, non-distended, No pulsatile mass, MUSCULOSKELETAL: No deformity  SKIN: Warm and dry NEUROLOGIC:  Alert and oriented x 3 PSYCHIATRIC:  Normal affect   ASSESSMENT:    1. New onset  atrial fibrillation (HCC)   2. Coronary artery calcification seen on CT scan   3. Hyperthyroidism   4. Current use of long term anticoagulation    PLAN:    In order of problems listed above:  Highly likely the current atrial fibrillation episodes are related to hyperthyroidism.  2D Doppler echocardiogram will be done to assess left ventricular structure and function.  I would like to further increase beta-blocker dose intensity to better control atrial fibrillation rate.  Her blood pressure is relatively soft and therefore will decrease hydrochlorothiazide to 12.5 mg/day.  When she confirms her specific metoprolol (succinate versus tartrate) we will appropriately increase the dose.  I suspect that with beta-blockade and management of hyperthyroidism, atrial fibrillation may disappear. Need to discuss primary cardiovascular risk reduction which should include a less than 70, management of of other risk factors.  We did not discuss this in detail today. Methimazole has been started along with beta-blocker therapy for hyperthyroidism. Agree with anticoagulation using Eliquis.  When or if anticoagulation can be discontinued will be determined in the future after hypothyroidism is under control.  At that point a 30-day monitor may be done and if no evidence of atrial fibrillation, we may be headed towards discontinuation of Eliquis.  Plan clinical follow-up in 6 weeks.  Consider increasing metoprolol to 50 mg/day (succinate).  We will need to discuss prevention of atherosclerosis progression at next visit.                          Medication Adjustments/Labs and Tests Ordered: Current medicines are reviewed at length with the patient today.  Concerns regarding medicines are outlined above.  Orders Placed This Encounter  Procedures   EKG 12-Lead   ECHOCARDIOGRAM COMPLETE    Meds ordered this encounter  Medications   hydrochlorothiazide (MICROZIDE) 12.5 MG capsule    Sig:  Take 1 capsule (12.5 mg total)  by mouth daily.    Dispense:  45 capsule    Refill:  3     Patient Instructions  Medication Instructions:  1) DECREASE HCTZ to 12.5mg  once daily 2) Call me or send a MyChart message letting me know which Metoprolol you are taking and the dose.  *If you need a refill on your cardiac medications before your next appointment, please call your pharmacy*   Lab Work: None If you have labs (blood work) drawn today and your tests are completely normal, you will receive your results only by: [place] (if you have MyChart) OR A paper copy in the mail If you have any lab test that is abnormal or we need to change your treatment, we will call you to review the results.   Testing/Procedures: Your physician has requested that you have an echocardiogram. Echocardiography is a painless test that uses sound waves to create images of your heart. It provides your doctor with information about the size and shape of your heart and how well your heart's chambers and valves are working. This procedure takes approximately one hour. There are no restrictions for this procedure.   Follow-Up: At [facility], you and your health needs are our priority.  As part of our continuing mission to provide you with exceptional heart care, we have created designated Provider Care Teams.  These Care Teams include your primary Cardiologist (physician) and Advanced Practice Providers (APPs -  Physician Assistants and Nurse Practitioners) who all work together to provide you with the care you need, when you need it.  We recommend signing up for the patient portal called "MyChart".  Sign up information is provided on this After Visit Summary.  MyChart is used to connect with patients for Virtual Visits (Telemedicine).  Patients are able to view lab/test results, encounter notes, upcoming appointments, etc.  Non-urgent messages can be sent to your provider as well.   To learn more about  what you can do with MyChart, go to [email or web address].    Your next appointment:   6-8 week(s)  The format for your next appointment:   In Person  Provider:   You may see Daneen Schick, MD or one of the following Advanced Practice Providers on your designated Care Team:   Cecilie Kicks, NP    Other Instructions     Signed, Sinclair Grooms, MD  08/03/2020 6:48 PM    [place]

## 2020-08-03 ENCOUNTER — Encounter: Payer: Self-pay | Admitting: Interventional Cardiology

## 2020-08-03 ENCOUNTER — Other Ambulatory Visit: Payer: Self-pay

## 2020-08-03 ENCOUNTER — Ambulatory Visit: Payer: Medicare PPO | Admitting: Interventional Cardiology

## 2020-08-03 VITALS — BP 100/58 | HR 70 | Ht 63.0 in | Wt 147.4 lb

## 2020-08-03 DIAGNOSIS — E0590 Thyrotoxicosis, unspecified without thyrotoxic crisis or storm: Secondary | ICD-10-CM

## 2020-08-03 DIAGNOSIS — I4891 Unspecified atrial fibrillation: Secondary | ICD-10-CM

## 2020-08-03 DIAGNOSIS — I2510 Atherosclerotic heart disease of native coronary artery without angina pectoris: Secondary | ICD-10-CM | POA: Diagnosis not present

## 2020-08-03 DIAGNOSIS — Z7901 Long term (current) use of anticoagulants: Secondary | ICD-10-CM | POA: Diagnosis not present

## 2020-08-03 MED ORDER — HYDROCHLOROTHIAZIDE 12.5 MG PO CAPS: 12.5000 mg | ORAL_CAPSULE | Freq: Every day | ORAL | 3 refills | Status: DC

## 2020-08-03 NOTE — Patient Instructions (Addendum)
Medication Instructions:  1) DECREASE HCTZ to 12.5mg  once daily 2) Call me or send a MyChart message letting me know which Metoprolol you are taking and the dose.  *If you need a refill on your cardiac medications before your next appointment, please call your pharmacy*   Lab Work: None If you have labs (blood work) drawn today and your tests are completely normal, you will receive your results only by: [place] (if you have MyChart) OR A paper copy in the mail If you have any lab test that is abnormal or we need to change your treatment, we will call you to review the results.   Testing/Procedures: Your physician has requested that you have an echocardiogram. Echocardiography is a painless test that uses sound waves to create images of your heart. It provides your doctor with information about the size and shape of your heart and how well your heart's chambers and valves are working. This procedure takes approximately one hour. There are no restrictions for this procedure.   Follow-Up: At [facility], you and your health needs are our priority.  As part of our continuing mission to provide you with exceptional heart care, we have created designated Provider Care Teams.  These Care Teams include your primary Cardiologist (physician) and Advanced Practice Providers (APPs -  Physician Assistants and Nurse Practitioners) who all work together to provide you with the care you need, when you need it.  We recommend signing up for the patient portal called "MyChart".  Sign up information is provided on this After Visit Summary.  MyChart is used to connect with patients for Virtual Visits (Telemedicine).  Patients are able to view lab/test results, encounter notes, upcoming appointments, etc.  Non-urgent messages can be sent to your provider as well.   To learn more about what you can do with MyChart, go to [email or web address].    Your next appointment:   6-8 week(s)  The format for  your next appointment:   In Person  Provider:   You may see Daneen Schick, MD or one of the following Advanced Practice Providers on your designated Care Team:   Cecilie Kicks, NP    Other Instructions

## 2020-08-04 ENCOUNTER — Other Ambulatory Visit: Payer: Self-pay | Admitting: *Deleted

## 2020-08-04 ENCOUNTER — Telehealth: Payer: Self-pay | Admitting: Interventional Cardiology

## 2020-08-04 MED ORDER — METOPROLOL SUCCINATE ER 25 MG PO TB24: 25.0000 mg | ORAL_TABLET | Freq: Every day | ORAL | 11 refills | Status: DC

## 2020-08-04 NOTE — Telephone Encounter (Signed)
Rx has been sent in as requested. Confirmation received.

## 2020-08-04 NOTE — Telephone Encounter (Signed)
That's fine

## 2020-08-04 NOTE — Telephone Encounter (Signed)
Is this okay to refill? 

## 2020-08-04 NOTE — Telephone Encounter (Signed)
 *  STAT* If patient is at the pharmacy, call can be transferred to refill team.   1. Which medications need to be refilled? (please list name of each medication and dose if known)   Metoprolol succ er 25mg  (sent through [place] appointment request pool)  2. Which pharmacy/location (including street and city if local pharmacy) is medication to be sent to?  [id number] - [place], [place] - [address] AT [place]  3. Do they need a 30 day or 90 day supply? [place]

## 2020-08-18 ENCOUNTER — Telehealth: Payer: Self-pay | Admitting: Interventional Cardiology

## 2020-08-18 DIAGNOSIS — M62830 Muscle spasm of back: Secondary | ICD-10-CM | POA: Diagnosis not present

## 2020-08-18 DIAGNOSIS — R0781 Pleurodynia: Secondary | ICD-10-CM | POA: Diagnosis not present

## 2020-08-18 NOTE — Telephone Encounter (Signed)
Pt c/o of Chest Pain: STAT if CP now or developed within 24 hours  1. Are you having CP right now? Yes on right side  2. Are you experiencing any other symptoms (ex. SOB, nausea, vomiting, sweating)? No   3. How long have you been experiencing CP? A couple weeks  4. Is your CP continuous or coming and going? continuous  5. Have you taken Nitroglycerin? No  ?

## 2020-08-18 NOTE — Telephone Encounter (Signed)
Pt states over the last couple of weeks she has been having some discomfort under her right breast.  This has started since stopping her Celebrex.  Mentions that she does have a hernia.  Pain worsens when rolling over in bed, bends over or if she presses on the area.  States area is tender.  Improves a little with Tylenol.  Denies any recent falls or trauma to the area.  Advised this does not sound cardiac related and to reach out to her PCP.  Pt also asked about pain medication stating she hurts all over since stopping Celebrex.  Advised her to speak to PCP about this as well. Pt appreciative for call.

## 2020-08-20 ENCOUNTER — Other Ambulatory Visit: Payer: Self-pay

## 2020-08-20 ENCOUNTER — Ambulatory Visit (HOSPITAL_COMMUNITY): Payer: Medicare PPO | Attending: Interventional Cardiology

## 2020-08-20 DIAGNOSIS — I4891 Unspecified atrial fibrillation: Secondary | ICD-10-CM

## 2020-08-20 LAB — ECHOCARDIOGRAM COMPLETE
Area-P 1/2: 3.08 cm2
MV M vel: 5.09 m/s
MV Peak grad: 103.6 mmHg
S' Lateral: 2.5 cm

## 2020-08-26 DIAGNOSIS — I480 Paroxysmal atrial fibrillation: Secondary | ICD-10-CM | POA: Diagnosis not present

## 2020-08-26 DIAGNOSIS — E0590 Thyrotoxicosis, unspecified without thyrotoxic crisis or storm: Secondary | ICD-10-CM | POA: Diagnosis not present

## 2020-08-26 DIAGNOSIS — R946 Abnormal results of thyroid function studies: Secondary | ICD-10-CM | POA: Diagnosis not present

## 2020-08-26 DIAGNOSIS — Z6826 Body mass index (BMI) 26.0-26.9, adult: Secondary | ICD-10-CM | POA: Diagnosis not present

## 2020-08-26 DIAGNOSIS — Z85118 Personal history of other malignant neoplasm of bronchus and lung: Secondary | ICD-10-CM | POA: Diagnosis not present

## 2020-08-26 DIAGNOSIS — Z853 Personal history of malignant neoplasm of breast: Secondary | ICD-10-CM | POA: Diagnosis not present

## 2020-08-27 DIAGNOSIS — M159 Polyosteoarthritis, unspecified: Secondary | ICD-10-CM | POA: Diagnosis not present

## 2020-09-21 ENCOUNTER — Ambulatory Visit (HOSPITAL_BASED_OUTPATIENT_CLINIC_OR_DEPARTMENT_OTHER): Payer: Medicare PPO | Admitting: Cardiology

## 2020-09-23 DIAGNOSIS — M5137 Other intervertebral disc degeneration, lumbosacral region: Secondary | ICD-10-CM | POA: Diagnosis not present

## 2020-09-23 DIAGNOSIS — E0590 Thyrotoxicosis, unspecified without thyrotoxic crisis or storm: Secondary | ICD-10-CM | POA: Diagnosis not present

## 2020-09-23 DIAGNOSIS — M150 Primary generalized (osteo)arthritis: Secondary | ICD-10-CM | POA: Diagnosis not present

## 2020-10-01 DIAGNOSIS — Z6826 Body mass index (BMI) 26.0-26.9, adult: Secondary | ICD-10-CM | POA: Diagnosis not present

## 2020-10-01 DIAGNOSIS — I480 Paroxysmal atrial fibrillation: Secondary | ICD-10-CM | POA: Diagnosis not present

## 2020-10-01 DIAGNOSIS — Z853 Personal history of malignant neoplasm of breast: Secondary | ICD-10-CM | POA: Diagnosis not present

## 2020-10-01 DIAGNOSIS — E0590 Thyrotoxicosis, unspecified without thyrotoxic crisis or storm: Secondary | ICD-10-CM | POA: Diagnosis not present

## 2020-10-01 DIAGNOSIS — Z85118 Personal history of other malignant neoplasm of bronchus and lung: Secondary | ICD-10-CM | POA: Diagnosis not present

## 2020-10-01 DIAGNOSIS — R946 Abnormal results of thyroid function studies: Secondary | ICD-10-CM | POA: Diagnosis not present

## 2020-10-01 NOTE — Progress Notes (Signed)
Cardiology Office Note:    Date:  10/02/2020   ID:  ILANNA DEIHL, DOB 1940/10/28, MRN [id number]  PCP:  [name], MD  Cardiologist:  Sinclair Grooms, MD   Referring MD: [name], MD   Chief Complaint  Patient presents with   Atrial Fibrillation     History of Present Illness:    SAMEKA BAGENT is a 80 y.o. female with a hx of atrial fibrillation, prediabetes, breast cancer, hyperlipidemia, hyperthyroidism, and anticoagulation therapy.  Denies cardiac symptoms.  On therapy for hyperthyroidism.  Not completely controlled but improving.  Being followed by endocrinology.  Also be seeing a rheumatologist because of possible rheumatoid arthritis.  Tolerating metoprolol well.  Was never symptomatic with episodes of atrial fibrillation.  Past Medical History:  Diagnosis Date   Adenocarcinoma of lung, stage 1, right ([place]) 2020   Anxiety    Arthritis    back   Breast cancer ([place]) 2000   Right breast   Cancer ([place]) 2000   rt breast cancer   Family history of breast cancer    GERD (gastroesophageal reflux disease)    H/O hiatal hernia    Hyperlipidemia    Hypertension    IBS (irritable bowel syndrome)    Neck pain    PONV (postoperative nausea and vomiting)    Pre-diabetes     Past Surgical History:  Procedure Laterality Date   ABDOMINAL HYSTERECTOMY     APPENDECTOMY     BACK SURGERY     BREAST BIOPSY     BREAST SURGERY     CARPOMETACARPEL SUSPENSION PLASTY Left 08/25/2015   Procedure: SUSPENSIONPLASTY LEFT THUMB TRAPEZIUM EXCISION ABDUCTOR POLLICIS LONGUS TRANSFER;  Surgeon: Daryll Brod, MD;  Location: [facility];  Service: Orthopedics;  Laterality: Left;   CHOLECYSTECTOMY     EYE SURGERY     cataract right and left eye   GAS INSERTION  06/23/2011   Procedure: INSERTION OF GAS;  Surgeon: Hayden Pedro, MD;  Location: [place];  Service: Ophthalmology;  Laterality: Left;  SF6   LOBECTOMY Right 07/26/2018   Procedure: RIGHT LOWER LOBE  LOBECTOMY;  Surgeon: Melrose Nakayama, MD;  Location: [place];  Service: Thoracic;  Laterality: Right;   MASTECTOMY     right breast   SCLERAL BUCKLE  06/23/2011   Procedure: SCLERAL BUCKLE;  Surgeon: Hayden Pedro, MD;  Location: [place];  Service: Ophthalmology;  Laterality: Left;   SEGMENTECOMY Right 07/26/2018   Procedure: SEGMENTECTOMY;  Surgeon: Melrose Nakayama, MD;  Location: [place];  Service: Thoracic;  Laterality: Right;   TONSILLECTOMY     VIDEO ASSISTED THORACOSCOPY Right 07/26/2018   Procedure: VIDEO ASSISTED THORACOSCOPY;  Surgeon: Melrose Nakayama, MD;  Location: [facility] OR;  Service: Thoracic;  Laterality: Right;    Current Medications: Current Meds  Medication Sig   acetaminophen (TYLENOL) 500 MG tablet Take 1 tablet (500 mg total) by mouth every 6 (six) hours as needed for mild pain.   alosetron (LOTRONEX) 0.5 MG tablet Take 0.5 mg by mouth as needed.   apixaban (ELIQUIS) 5 MG TABS tablet Take 5 mg by mouth 2 (two) times daily.   azelastine (ASTELIN) 0.1 % nasal spray Place 1 spray into both nostrils 2 (two) times daily. Use in each nostril as directed   azelastine (ASTELIN) 0.1 % nasal spray 1 puff in each nostril   calcium carbonate (OSCAL) 1500 (600 Ca) MG TABS tablet Take 600 mg of elemental calcium by mouth daily.  cholecalciferol (VITAMIN D3) 25 MCG (1000 UT) tablet Take 1,000 Units by mouth daily.   ezetimibe (ZETIA) 10 MG tablet Take 10 mg by mouth daily.   hydrochlorothiazide (MICROZIDE) 12.5 MG capsule Take 1 capsule (12.5 mg total) by mouth daily.   loratadine (CLARITIN) 10 MG tablet Take 10 mg by mouth as needed.   LORazepam (ATIVAN) 1 MG tablet Take 1-2 mg by mouth at bedtime.    losartan (COZAAR) 100 MG tablet Take 100 mg by mouth daily.   methimazole (TAPAZOLE) 10 MG tablet Take 20 mg by mouth daily.   metoprolol succinate (TOPROL XL) 25 MG 24 hr tablet Take 1 tablet (25 mg total) by mouth daily.   Polyethyl Glycol-Propyl Glycol (SYSTANE OP) Place 1  drop into both eyes 2 (two) times daily as needed (dry eyes).   pregabalin (LYRICA) 50 MG capsule Take 50 mg by mouth 2 (two) times daily.    sertraline (ZOLOFT) 50 MG tablet Take 50 mg by mouth daily.   traMADol (ULTRAM) 50 MG tablet Take 50 mg by mouth 3 (three) times daily.     Allergies:   Hydrocodone, Codeine, Statins, and Sulfa antibiotics   Social History   Socioeconomic History   Marital status: Married    Spouse name: Not on file   Number of children: Not on file   Years of education: Not on file   Highest education level: Not on file  Occupational History   Not on file  Tobacco Use   Smoking status: Former    Packs/day: 1.00    Years: 30.00    Pack years: 30.00    Types: Cigarettes    Quit date: 06/22/2002    Years since quitting: 18.2   Smokeless tobacco: Never  Vaping Use   Vaping Use: Never used  Substance and Sexual Activity   Alcohol use: Yes    Comment: rarely   Drug use: Yes    Types: Flunitrazepam   Sexual activity: Not Currently  Other Topics Concern   Not on file  Social History Narrative   Not on file   Social Determinants of Health   Financial Resource Strain: Not on file  Food Insecurity: Not on file  Transportation Needs: Not on file  Physical Activity: Not on file  Stress: Not on file  Social Connections: Not on file     Family History: The patient's family history includes Breast cancer in her mother; Breast cancer (age of onset: 70) in her daughter; COPD in her father; Cancer in her cousin; Cerebral palsy in her brother; Lung cancer in her maternal uncle; Prostate cancer in her father; Stroke in her maternal grandfather. There is no history of Anesthesia problems, Hypotension, Malignant hyperthermia, or Pseudochol deficiency.  ROS:   Please see the history of present illness.    Still on methimazole therapy.  She was unsure if the plan is to continue current therapy or have radiation therapy.  All other systems reviewed and are  negative.  EKGs/Labs/Other Studies Reviewed:    The following studies were reviewed today: 2D Doppler echocardiogram 08/20/2020 IMPRESSIONS     1. Left ventricular ejection fraction, by estimation, is 60 to 65%. The  left ventricle has normal function. The left ventricle has no regional  wall motion abnormalities. Left ventricular diastolic parameters are  indeterminate. The average left  ventricular global longitudinal strain is -18.5 %. The global longitudinal  strain is normal.   2. Right ventricular systolic function is normal. The right ventricular  size is  normal. There is mildly elevated pulmonary artery systolic  pressure. The estimated right ventricular systolic pressure is 53.2 mmHg.   3. Left atrial size was mildly dilated.   4. The mitral valve is abnormal. Moderate mitral valve regurgitation. No  evidence of mitral stenosis.   5. The aortic valve is tricuspid. Aortic valve regurgitation is not  visualized. Mild to moderate aortic valve sclerosis/calcification is  present, without any evidence of aortic stenosis.   6. The inferior vena cava is normal in size with <50% respiratory  variability, suggesting right atrial pressure of 8 mmHg.   EKG:  EKG not repeated today.  Recent Labs: 03/20/2020: ALT 19; BUN 33; Creatinine 1.09; Hemoglobin 13.9; Platelet Count 287; Potassium 4.2; Sodium 139  Recent Lipid Panel No results found for: CHOL, TRIG, HDL, CHOLHDL, VLDL, LDLCALC, LDLDIRECT  Physical Exam:    VS:  BP 108/68   Pulse 63   Ht 5\' 3"  (1.6 m)   Wt 148 lb 12.8 oz (67.5 kg)   SpO2 97%   BMI 26.36 kg/m     Wt Readings from Last 3 Encounters:  10/02/20 148 lb 12.8 oz (67.5 kg)  08/03/20 147 lb 6.4 oz (66.9 kg)  03/23/20 149 lb 12.8 oz (67.9 kg)     GEN: Healthy appearing. No acute distress HEENT: Normal NECK: No JVD. LYMPHATICS: No lymphadenopathy CARDIAC: No murmur. RRR no gallop, or edema. VASCULAR: In sinus rhythm based on today's exam.   Normal Pulses.  No bruits. RESPIRATORY:  Clear to auscultation without rales, wheezing or rhonchi  ABDOMEN: Soft, non-tender, non-distended, No pulsatile mass, MUSCULOSKELETAL: No deformity  SKIN: Warm and dry NEUROLOGIC:  Alert and oriented x 3 PSYCHIATRIC:  Normal affect   ASSESSMENT:    1. New onset atrial fibrillation (HCC)   2. Coronary artery calcification seen on CT scan   3. Hyperthyroidism   4. Current use of long term anticoagulation    PLAN:    In order of problems listed above:  On metoprolol as well.  Thyroid status has improved.  30-day monitor will be done to determine A. fib burden.  If none, and once thyroid is optimally treated, may be able to discontinue anticoagulation. Did not discuss Being managed by primary care/endocrinology Continue Eliquis   54-month follow-up   Medication Adjustments/Labs and Tests Ordered: Current medicines are reviewed at length with the patient today.  Concerns regarding medicines are outlined above.  Orders Placed This Encounter  Procedures   Cardiac event monitor   No orders of the defined types were placed in this encounter.   Patient Instructions  Medication Instructions:  Your physician recommends that you continue on your current medications as directed. Please refer to the Current Medication list given to you today.  *If you need a refill on your cardiac medications before your next appointment, please call your pharmacy*   Lab Work: None If you have labs (blood work) drawn today and your tests are completely normal, you will receive your results only by: [place] (if you have MyChart) OR A paper copy in the mail If you have any lab test that is abnormal or we need to change your treatment, we will call you to review the results.   Testing/Procedures: Your physician has recommended that you wear an event monitor. Event monitors are medical devices that record the heart's electrical activity. Doctors most often [place] these  monitors to diagnose arrhythmias. Arrhythmias are problems with the speed or rhythm of the heartbeat. The monitor is a  small, portable device. You can wear one while you do your normal daily activities. This is usually used to diagnose what is causing palpitations/syncope (passing out).   Follow-Up: At [facility], you and your health needs are our priority.  As part of our continuing mission to provide you with exceptional heart care, we have created designated Provider Care Teams.  These Care Teams include your primary Cardiologist (physician) and Advanced Practice Providers (APPs -  Physician Assistants and Nurse Practitioners) who all work together to provide you with the care you need, when you need it.  We recommend signing up for the patient portal called "MyChart".  Sign up information is provided on this After Visit Summary.  MyChart is used to connect with patients for Virtual Visits (Telemedicine).  Patients are able to view lab/test results, encounter notes, upcoming appointments, etc.  Non-urgent messages can be sent to your provider as well.   To learn more about what you can do with MyChart, go to [email or web address].    Your next appointment:   6 month(s)  The format for your next appointment:   In Person  Provider:   You may see Sinclair Grooms, MD or one of the following Advanced Practice Providers on your designated Care Team:   Cecilie Kicks, NP   Other Instructions  [employer] Cardiac Event Monitor Instructions Your physician has requested you wear your cardiac event monitor for 30 days. [employer] may call or text to confirm a shipping address. The monitor will be sent to a land address via UPS. [employer] will not ship a monitor to a PO BOX. It typically takes 3-5 days to receive your monitor after it has been enrolled. [employer] will assist with USPS tracking if your package is delayed. The telephone number for [employer] is [phone]. Once you have  received your monitor, please review the enclosed instructions. Instruction tutorials can also be viewed under help and settings on the enclosed cell phone. Your monitor has already been registered assigning a specific monitor serial # to you.  Applying the monitor Remove cell phone from case and turn it on. The cell phone works as Dealer and needs to be within [employer] of you at all times. The cell phone will need to be charged on a daily basis. We recommend you plug the cell phone into the enclosed charger at your bedside table every night.  Monitor batteries: You will receive two monitor batteries labelled #1 and #2. These are your recorders. Plug battery #2 onto the second connection on the enclosed charger. Keep one battery on the charger at all times. This will keep the monitor battery deactivated. It will also keep it fully charged for when you need to switch your monitor batteries. A small light will be blinking on the battery emblem when it is charging. The light on the battery emblem will remain on when the battery is fully charged.  Open package of a Monitor strip. Insert battery #1 into black hood on strip and gently squeeze monitor battery onto connection as indicated in instruction booklet. Set aside while preparing skin.  Choose location for your strip, vertical or horizontal, as indicated in the instruction booklet. Shave to remove all hair from location. There cannot be any lotions, oils, powders, or colognes on skin where monitor is to be applied. Wipe skin clean with enclosed Saline wipe. Dry skin completely.  Peel paper labeled #1 off the back of the Monitor strip exposing the adhesive. Place the monitor on the  chest in the vertical or horizontal position shown in the instruction booklet. One arrow on the monitor strip must be pointing upward. Carefully remove paper labeled #2, attaching remainder of strip to your skin. Try not to create any folds or wrinkles in  the strip as you apply it.  Firmly press and release the circle in the center of the monitor battery. You will hear a small beep. This is turning the monitor battery on. The heart emblem on the monitor battery will light up every 5 seconds if the monitor battery in turned on and connected to the patient securely. Do not push and hold the circle down as this turns the monitor battery off. The cell phone will locate the monitor battery. A screen will appear on the cell phone checking the connection of your monitor strip. This may read poor connection initially but change to good connection within the next minute. Once your monitor accepts the connection you will hear a series of 3 beeps followed by a climbing crescendo of beeps. A screen will appear on the cell phone showing the two monitor strip placement options. Touch the picture that demonstrates where you applied the monitor strip.  Your monitor strip and battery are waterproof. You are able to shower, bathe, or swim with the monitor on. They just ask you do not submerge deeper than 3 feet underwater. We recommend removing the monitor if you are swimming in a lake, river, or ocean.  Your monitor battery will need to be switched to a fully charged monitor battery approximately once a week. The cell phone will alert you of an action which needs to be made.  On the cell phone, tap for details to reveal connection status, monitor battery status, and cell phone battery status. The green dots indicates your monitor is in good status. A red dot indicates there is something that needs your attention.  To record a symptom, click the circle on the monitor battery. In 30-60 seconds a list of symptoms will appear on the cell phone. Select your symptom and tap save. Your monitor will record a sustained or significant arrhythmia regardless of you clicking the button. Some patients do not feel the heart rhythm irregularities. [employer] will notify us  of any serious or critical events.  Refer to instruction booklet for instructions on switching batteries, changing strips, the Do not disturb or Pause features, or any additional questions.  Call [employer] at [phone], to confirm your monitor is transmitting and record your baseline. They will answer any questions you may have regarding the monitor instructions at that time.  Returning the monitor to [place] all equipment back into blue box. Peel off strip of paper to expose adhesive and close box securely. There is a prepaid UPS shipping label on this box. Drop in a UPS drop box, or at a UPS facility like [employer]. You may also contact [employer] to arrange UPS to pick up monitor package at your home.     Signed, Sinclair Grooms, MD  10/02/2020 11:23 AM    [place]

## 2020-10-02 ENCOUNTER — Encounter: Payer: Self-pay | Admitting: Radiology

## 2020-10-02 ENCOUNTER — Encounter: Payer: Self-pay | Admitting: Interventional Cardiology

## 2020-10-02 ENCOUNTER — Ambulatory Visit: Payer: Medicare PPO | Admitting: Interventional Cardiology

## 2020-10-02 ENCOUNTER — Other Ambulatory Visit: Payer: Self-pay

## 2020-10-02 VITALS — BP 108/68 | HR 63 | Ht 63.0 in | Wt 148.8 lb

## 2020-10-02 DIAGNOSIS — I4891 Unspecified atrial fibrillation: Secondary | ICD-10-CM | POA: Diagnosis not present

## 2020-10-02 DIAGNOSIS — I2510 Atherosclerotic heart disease of native coronary artery without angina pectoris: Secondary | ICD-10-CM | POA: Diagnosis not present

## 2020-10-02 DIAGNOSIS — Z7901 Long term (current) use of anticoagulants: Secondary | ICD-10-CM | POA: Diagnosis not present

## 2020-10-02 DIAGNOSIS — E0590 Thyrotoxicosis, unspecified without thyrotoxic crisis or storm: Secondary | ICD-10-CM

## 2020-10-02 NOTE — Progress Notes (Signed)
Enrolled patient for a 30 day [employer] event monitor to be mailed to patients home

## 2020-10-02 NOTE — Patient Instructions (Signed)
Medication Instructions:  Your physician recommends that you continue on your current medications as directed. Please refer to the Current Medication list given to you today.  *If you need a refill on your cardiac medications before your next appointment, please call your pharmacy*   Lab Work: None If you have labs (blood work) drawn today and your tests are completely normal, you will receive your results only by: [facility] (if you have MyChart) OR A paper copy in the mail If you have any lab test that is abnormal or we need to change your treatment, we will call you to review the results.   Testing/Procedures: Your physician has recommended that you wear an event monitor. Event monitors are medical devices that record the heart's electrical activity. Doctors most often [place] these monitors to diagnose arrhythmias. Arrhythmias are problems with the speed or rhythm of the heartbeat. The monitor is a small, portable device. You can wear one while you do your normal daily activities. This is usually used to diagnose what is causing palpitations/syncope (passing out).   Follow-Up: At [facility], you and your health needs are our priority.  As part of our continuing mission to provide you with exceptional heart care, we have created designated Provider Care Teams.  These Care Teams include your primary Cardiologist (physician) and Advanced Practice Providers (APPs -  Physician Assistants and Nurse Practitioners) who all work together to provide you with the care you need, when you need it.  We recommend signing up for the patient portal called "MyChart".  Sign up information is provided on this After Visit Summary.  MyChart is used to connect with patients for Virtual Visits (Telemedicine).  Patients are able to view lab/test results, encounter notes, upcoming appointments, etc.  Non-urgent messages can be sent to your provider as well.   To learn more about what you can do with MyChart, go  to [email or web address].    Your next appointment:   6 month(s)  The format for your next appointment:   In Person  Provider:   You may see Sinclair Grooms, MD or one of the following Advanced Practice Providers on your designated Care Team:   Cecilie Kicks, NP   Other Instructions  [employer] Cardiac Event Monitor Instructions Your physician has requested you wear your cardiac event monitor for 30 days. [employer] may call or text to confirm a shipping address. The monitor will be sent to a land address via UPS. [employer] will not ship a monitor to a PO BOX. It typically takes 3-5 days to receive your monitor after it has been enrolled. [employer] will assist with USPS tracking if your package is delayed. The telephone number for [employer] is [phone]. Once you have received your monitor, please review the enclosed instructions. Instruction tutorials can also be viewed under help and settings on the enclosed cell phone. Your monitor has already been registered assigning a specific monitor serial # to you.  Applying the monitor Remove cell phone from case and turn it on. The cell phone works as Dealer and needs to be within [employer] of you at all times. The cell phone will need to be charged on a daily basis. We recommend you plug the cell phone into the enclosed charger at your bedside table every night.  Monitor batteries: You will receive two monitor batteries labelled #1 and #2. These are your recorders. Plug battery #2 onto the second connection on the enclosed charger. Keep one battery on the charger at  all times. This will keep the monitor battery deactivated. It will also keep it fully charged for when you need to switch your monitor batteries. A small light will be blinking on the battery emblem when it is charging. The light on the battery emblem will remain on when the battery is fully charged.  Open package of a Monitor strip. Insert battery #1  into black hood on strip and gently squeeze monitor battery onto connection as indicated in instruction booklet. Set aside while preparing skin.  Choose location for your strip, vertical or horizontal, as indicated in the instruction booklet. Shave to remove all hair from location. There cannot be any lotions, oils, powders, or colognes on skin where monitor is to be applied. Wipe skin clean with enclosed Saline wipe. Dry skin completely.  Peel paper labeled #1 off the back of the Monitor strip exposing the adhesive. Place the monitor on the chest in the vertical or horizontal position shown in the instruction booklet. One arrow on the monitor strip must be pointing upward. Carefully remove paper labeled #2, attaching remainder of strip to your skin. Try not to create any folds or wrinkles in the strip as you apply it.  Firmly press and release the circle in the center of the monitor battery. You will hear a small beep. This is turning the monitor battery on. The heart emblem on the monitor battery will light up every 5 seconds if the monitor battery in turned on and connected to the patient securely. Do not push and hold the circle down as this turns the monitor battery off. The cell phone will locate the monitor battery. A screen will appear on the cell phone checking the connection of your monitor strip. This may read poor connection initially but change to good connection within the next minute. Once your monitor accepts the connection you will hear a series of 3 beeps followed by a climbing crescendo of beeps. A screen will appear on the cell phone showing the two monitor strip placement options. Touch the picture that demonstrates where you applied the monitor strip.  Your monitor strip and battery are waterproof. You are able to shower, bathe, or swim with the monitor on. They just ask you do not submerge deeper than 3 feet underwater. We recommend removing the monitor if you are  swimming in a lake, river, or ocean.  Your monitor battery will need to be switched to a fully charged monitor battery approximately once a week. The cell phone will alert you of an action which needs to be made.  On the cell phone, tap for details to reveal connection status, monitor battery status, and cell phone battery status. The green dots indicates your monitor is in good status. A red dot indicates there is something that needs your attention.  To record a symptom, click the circle on the monitor battery. In 30-60 seconds a list of symptoms will appear on the cell phone. Select your symptom and tap save. Your monitor will record a sustained or significant arrhythmia regardless of you clicking the button. Some patients do not feel the heart rhythm irregularities. [employer] will notify us of any serious or critical events.  Refer to instruction booklet for instructions on switching batteries, changing strips, the Do not disturb or Pause features, or any additional questions.  Call [employer] at [phone], to confirm your monitor is transmitting and record your baseline. They will answer any questions you may have regarding the monitor instructions at that time.  Returning  the monitor to [employer] Place all equipment back into blue box. Peel off strip of paper to expose adhesive and close box securely. There is a prepaid UPS shipping label on this box. Drop in a UPS drop box, or at a UPS facility like [employer]. You may also contact [employer] to arrange UPS to pick up monitor package at your home.

## 2020-10-07 DIAGNOSIS — M5137 Other intervertebral disc degeneration, lumbosacral region: Secondary | ICD-10-CM | POA: Diagnosis not present

## 2020-10-07 DIAGNOSIS — M79672 Pain in left foot: Secondary | ICD-10-CM | POA: Diagnosis not present

## 2020-10-07 DIAGNOSIS — M79641 Pain in right hand: Secondary | ICD-10-CM | POA: Diagnosis not present

## 2020-10-07 DIAGNOSIS — N289 Disorder of kidney and ureter, unspecified: Secondary | ICD-10-CM | POA: Diagnosis not present

## 2020-10-07 DIAGNOSIS — M1990 Unspecified osteoarthritis, unspecified site: Secondary | ICD-10-CM | POA: Diagnosis not present

## 2020-10-07 DIAGNOSIS — M79671 Pain in right foot: Secondary | ICD-10-CM | POA: Diagnosis not present

## 2020-10-07 DIAGNOSIS — M109 Gout, unspecified: Secondary | ICD-10-CM | POA: Diagnosis not present

## 2020-10-07 DIAGNOSIS — M25562 Pain in left knee: Secondary | ICD-10-CM | POA: Diagnosis not present

## 2020-10-07 DIAGNOSIS — M79642 Pain in left hand: Secondary | ICD-10-CM | POA: Diagnosis not present

## 2020-10-07 DIAGNOSIS — I10 Essential (primary) hypertension: Secondary | ICD-10-CM | POA: Diagnosis not present

## 2020-10-07 DIAGNOSIS — M5136 Other intervertebral disc degeneration, lumbar region: Secondary | ICD-10-CM | POA: Diagnosis not present

## 2020-10-07 DIAGNOSIS — M130 Polyarthritis, unspecified: Secondary | ICD-10-CM | POA: Diagnosis not present

## 2020-10-07 DIAGNOSIS — E0590 Thyrotoxicosis, unspecified without thyrotoxic crisis or storm: Secondary | ICD-10-CM | POA: Diagnosis not present

## 2020-10-07 DIAGNOSIS — I480 Paroxysmal atrial fibrillation: Secondary | ICD-10-CM | POA: Diagnosis not present

## 2020-10-07 DIAGNOSIS — M25561 Pain in right knee: Secondary | ICD-10-CM | POA: Diagnosis not present

## 2020-10-08 ENCOUNTER — Encounter: Payer: Self-pay | Admitting: Interventional Cardiology

## 2020-10-08 ENCOUNTER — Ambulatory Visit (INDEPENDENT_AMBULATORY_CARE_PROVIDER_SITE_OTHER): Payer: Medicare PPO

## 2020-10-08 DIAGNOSIS — I4891 Unspecified atrial fibrillation: Secondary | ICD-10-CM | POA: Diagnosis not present

## 2020-10-13 ENCOUNTER — Telehealth: Payer: Self-pay | Admitting: Internal Medicine

## 2020-10-13 NOTE — Telephone Encounter (Signed)
Paged by [employer] re run of atrial fibrillation at a rate of 160. Patient was called but did not pick up and a VM was left by the answering service.

## 2020-10-14 ENCOUNTER — Telehealth: Payer: Self-pay

## 2020-10-14 NOTE — Telephone Encounter (Signed)
   Cardiac Monitor Alert  Date of alert:  10/14/2020   Patient Name: Vanessa Day  DOB: [birth year]  MRN: [id number]   [facility] Cardiologist: Sinclair Grooms, MD  [facility] EP:  None    Monitor Information: Cardiac Event Monitor [[employer]]  Reason:  Assess Afib Burden Ordering provider:  Daneen Schick, MD   Alert Atrial Fibrillation/Flutter This is the 1st alert for this rhythm.  The patient has a hx of Atrial Fibrillation/Flutter.  The patient is currently on anticoagulation and has not missed any doses. Anticoagulation medication as of 10/14/2020           apixaban (ELIQUIS) 5 MG TABS tablet Take 5 mg by mouth 2 (two) times daily.       Next Cardiology Appointment   Date:  04/05/21  Provider:  Tamala Julian, MD  The patient was contacted today.  She is asymptomatic. Monitor use is to determine Afib burden.  No further action needed.        Precious Gilding, RN  10/14/2020 10:22 AM

## 2020-10-15 MED ORDER — METOPROLOL SUCCINATE ER 50 MG PO TB24: 50.0000 mg | ORAL_TABLET | Freq: Every day | ORAL | 3 refills | Status: DC

## 2020-10-15 NOTE — Telephone Encounter (Signed)
Let her know the monitor has already detected episode of atrial fibrillation/atrial flutter.  We will see what burden is present on the monitor when completed.  This is not encouraging relative to discontinuing anticoagulation.  We will continue to follow.  The atrial flutter was associated with heart rate of 150 to 160 bpm.  Increase Toprol-XL to 50 mg/day.  An alternative would be taking 25 mg twice a day.

## 2020-10-15 NOTE — Telephone Encounter (Signed)
Spoke with pt and made her aware of information and recommendations from Dr. Tamala Julian.  Pt agreeable to increase Metoprolol to 50mg  QD.  She will take two of her 25mg  tabs until she uses them up.  Pt appreciative for call.

## 2020-10-15 NOTE — Addendum Note (Signed)
Addended by: Loren Racer on: 10/15/2020 03:14 PM   Modules accepted: Orders

## 2020-10-19 DIAGNOSIS — I4891 Unspecified atrial fibrillation: Secondary | ICD-10-CM | POA: Diagnosis not present

## 2020-10-19 DIAGNOSIS — G8929 Other chronic pain: Secondary | ICD-10-CM | POA: Diagnosis not present

## 2020-10-19 DIAGNOSIS — F419 Anxiety disorder, unspecified: Secondary | ICD-10-CM | POA: Diagnosis not present

## 2020-10-19 DIAGNOSIS — E785 Hyperlipidemia, unspecified: Secondary | ICD-10-CM | POA: Diagnosis not present

## 2020-10-19 DIAGNOSIS — E1142 Type 2 diabetes mellitus with diabetic polyneuropathy: Secondary | ICD-10-CM | POA: Diagnosis not present

## 2020-10-19 DIAGNOSIS — F3341 Major depressive disorder, recurrent, in partial remission: Secondary | ICD-10-CM | POA: Diagnosis not present

## 2020-10-19 DIAGNOSIS — I10 Essential (primary) hypertension: Secondary | ICD-10-CM | POA: Diagnosis not present

## 2020-10-19 DIAGNOSIS — E0590 Thyrotoxicosis, unspecified without thyrotoxic crisis or storm: Secondary | ICD-10-CM | POA: Diagnosis not present

## 2020-10-19 DIAGNOSIS — D6869 Other thrombophilia: Secondary | ICD-10-CM | POA: Diagnosis not present

## 2020-10-20 ENCOUNTER — Telehealth: Payer: Self-pay | Admitting: *Deleted

## 2020-10-20 DIAGNOSIS — R946 Abnormal results of thyroid function studies: Secondary | ICD-10-CM | POA: Diagnosis not present

## 2020-10-20 NOTE — Telephone Encounter (Signed)
   Cardiac Monitor Alert  Date of alert:  10/20/2020   Patient Name: Vanessa Day  DOB: Sep 13, 1940  MRN: [id number]   [facility] Cardiologist: Sinclair Grooms, MD  [facility] EP:  None    Monitor Information: Cardiac Event Monitor [[employer]]  Reason:  Assess Afib burden Ordering provider:  Dr. Tamala Julian  :1}  Alert Ventricular Tachycardia This is the 1st alert for this rhythm.   Next Cardiology Appointment   Date:  04/05/21  Provider:  Tamala Julian  The patient was contacted today.  She is asymptomatic. Arrhythmia, symptoms and history reviewed with DOD Turner .   Plan:  Continue to monitor, signed and placed to be scanned into epic.   Other: No missed doses of medication and continuing to take increased dose of Toprol XL  Darrell Jewel, RN  10/20/2020 10:22 AM

## 2020-10-23 DIAGNOSIS — M1990 Unspecified osteoarthritis, unspecified site: Secondary | ICD-10-CM | POA: Diagnosis not present

## 2020-10-23 DIAGNOSIS — M5136 Other intervertebral disc degeneration, lumbar region: Secondary | ICD-10-CM | POA: Diagnosis not present

## 2020-10-23 DIAGNOSIS — N289 Disorder of kidney and ureter, unspecified: Secondary | ICD-10-CM | POA: Diagnosis not present

## 2020-10-23 DIAGNOSIS — M79671 Pain in right foot: Secondary | ICD-10-CM | POA: Diagnosis not present

## 2020-10-23 DIAGNOSIS — I480 Paroxysmal atrial fibrillation: Secondary | ICD-10-CM | POA: Diagnosis not present

## 2020-10-23 DIAGNOSIS — M109 Gout, unspecified: Secondary | ICD-10-CM | POA: Diagnosis not present

## 2020-10-23 DIAGNOSIS — E0590 Thyrotoxicosis, unspecified without thyrotoxic crisis or storm: Secondary | ICD-10-CM | POA: Diagnosis not present

## 2020-10-23 DIAGNOSIS — M130 Polyarthritis, unspecified: Secondary | ICD-10-CM | POA: Diagnosis not present

## 2020-10-23 DIAGNOSIS — I10 Essential (primary) hypertension: Secondary | ICD-10-CM | POA: Diagnosis not present

## 2020-10-30 DIAGNOSIS — D6869 Other thrombophilia: Secondary | ICD-10-CM | POA: Diagnosis not present

## 2020-10-30 DIAGNOSIS — M109 Gout, unspecified: Secondary | ICD-10-CM | POA: Diagnosis not present

## 2020-10-30 DIAGNOSIS — E0590 Thyrotoxicosis, unspecified without thyrotoxic crisis or storm: Secondary | ICD-10-CM | POA: Diagnosis not present

## 2020-10-30 DIAGNOSIS — I10 Essential (primary) hypertension: Secondary | ICD-10-CM | POA: Diagnosis not present

## 2020-10-30 DIAGNOSIS — E782 Mixed hyperlipidemia: Secondary | ICD-10-CM | POA: Diagnosis not present

## 2020-10-30 DIAGNOSIS — E1169 Type 2 diabetes mellitus with other specified complication: Secondary | ICD-10-CM | POA: Diagnosis not present

## 2020-10-30 DIAGNOSIS — M2550 Pain in unspecified joint: Secondary | ICD-10-CM | POA: Diagnosis not present

## 2020-10-30 DIAGNOSIS — H5789 Other specified disorders of eye and adnexa: Secondary | ICD-10-CM | POA: Diagnosis not present

## 2020-10-30 DIAGNOSIS — I480 Paroxysmal atrial fibrillation: Secondary | ICD-10-CM | POA: Diagnosis not present

## 2020-11-11 ENCOUNTER — Telehealth: Payer: Self-pay | Admitting: Interventional Cardiology

## 2020-11-11 NOTE — Telephone Encounter (Signed)
Spoke with Vanessa Day and made her aware that I am waiting on Dr. Tamala Julian to review her monitor and we will be in touch with results as soon as he does.  Vanessa Day appreciative for call.

## 2020-11-11 NOTE — Telephone Encounter (Signed)
Pt is wanting update on the monitor results and recommendation moving forward... please advise

## 2020-11-12 DIAGNOSIS — R946 Abnormal results of thyroid function studies: Secondary | ICD-10-CM | POA: Diagnosis not present

## 2020-11-17 DIAGNOSIS — H524 Presbyopia: Secondary | ICD-10-CM | POA: Diagnosis not present

## 2020-11-17 DIAGNOSIS — H04123 Dry eye syndrome of bilateral lacrimal glands: Secondary | ICD-10-CM | POA: Diagnosis not present

## 2020-11-17 DIAGNOSIS — H04203 Unspecified epiphora, bilateral lacrimal glands: Secondary | ICD-10-CM | POA: Diagnosis not present

## 2020-11-17 DIAGNOSIS — E119 Type 2 diabetes mellitus without complications: Secondary | ICD-10-CM | POA: Diagnosis not present

## 2020-11-17 DIAGNOSIS — H52203 Unspecified astigmatism, bilateral: Secondary | ICD-10-CM | POA: Diagnosis not present

## 2020-11-17 DIAGNOSIS — Z961 Presence of intraocular lens: Secondary | ICD-10-CM | POA: Diagnosis not present

## 2020-11-19 DIAGNOSIS — I480 Paroxysmal atrial fibrillation: Secondary | ICD-10-CM | POA: Diagnosis not present

## 2020-11-19 DIAGNOSIS — Z6826 Body mass index (BMI) 26.0-26.9, adult: Secondary | ICD-10-CM | POA: Diagnosis not present

## 2020-11-19 DIAGNOSIS — Z853 Personal history of malignant neoplasm of breast: Secondary | ICD-10-CM | POA: Diagnosis not present

## 2020-11-19 DIAGNOSIS — R0781 Pleurodynia: Secondary | ICD-10-CM | POA: Diagnosis not present

## 2020-11-19 DIAGNOSIS — E0590 Thyrotoxicosis, unspecified without thyrotoxic crisis or storm: Secondary | ICD-10-CM | POA: Diagnosis not present

## 2020-11-19 DIAGNOSIS — Z85118 Personal history of other malignant neoplasm of bronchus and lung: Secondary | ICD-10-CM | POA: Diagnosis not present

## 2020-12-02 DIAGNOSIS — J208 Acute bronchitis due to other specified organisms: Secondary | ICD-10-CM | POA: Diagnosis not present

## 2020-12-29 DIAGNOSIS — D6869 Other thrombophilia: Secondary | ICD-10-CM | POA: Diagnosis not present

## 2020-12-29 DIAGNOSIS — M2550 Pain in unspecified joint: Secondary | ICD-10-CM | POA: Diagnosis not present

## 2020-12-29 DIAGNOSIS — E0500 Thyrotoxicosis with diffuse goiter without thyrotoxic crisis or storm: Secondary | ICD-10-CM | POA: Diagnosis not present

## 2020-12-29 DIAGNOSIS — E0590 Thyrotoxicosis, unspecified without thyrotoxic crisis or storm: Secondary | ICD-10-CM | POA: Diagnosis not present

## 2020-12-29 DIAGNOSIS — I10 Essential (primary) hypertension: Secondary | ICD-10-CM | POA: Diagnosis not present

## 2020-12-29 DIAGNOSIS — I4891 Unspecified atrial fibrillation: Secondary | ICD-10-CM | POA: Diagnosis not present

## 2020-12-29 DIAGNOSIS — E782 Mixed hyperlipidemia: Secondary | ICD-10-CM | POA: Diagnosis not present

## 2020-12-29 DIAGNOSIS — M109 Gout, unspecified: Secondary | ICD-10-CM | POA: Diagnosis not present

## 2020-12-29 DIAGNOSIS — E1169 Type 2 diabetes mellitus with other specified complication: Secondary | ICD-10-CM | POA: Diagnosis not present

## 2021-01-05 DIAGNOSIS — Z85118 Personal history of other malignant neoplasm of bronchus and lung: Secondary | ICD-10-CM | POA: Diagnosis not present

## 2021-01-05 DIAGNOSIS — Z6826 Body mass index (BMI) 26.0-26.9, adult: Secondary | ICD-10-CM | POA: Diagnosis not present

## 2021-01-05 DIAGNOSIS — R0781 Pleurodynia: Secondary | ICD-10-CM | POA: Diagnosis not present

## 2021-01-05 DIAGNOSIS — I480 Paroxysmal atrial fibrillation: Secondary | ICD-10-CM | POA: Diagnosis not present

## 2021-01-05 DIAGNOSIS — Z853 Personal history of malignant neoplasm of breast: Secondary | ICD-10-CM | POA: Diagnosis not present

## 2021-01-05 DIAGNOSIS — E1169 Type 2 diabetes mellitus with other specified complication: Secondary | ICD-10-CM | POA: Diagnosis not present

## 2021-01-05 DIAGNOSIS — E0590 Thyrotoxicosis, unspecified without thyrotoxic crisis or storm: Secondary | ICD-10-CM | POA: Diagnosis not present

## 2021-02-18 DIAGNOSIS — I480 Paroxysmal atrial fibrillation: Secondary | ICD-10-CM | POA: Diagnosis not present

## 2021-02-18 DIAGNOSIS — M5136 Other intervertebral disc degeneration, lumbar region: Secondary | ICD-10-CM | POA: Diagnosis not present

## 2021-02-18 DIAGNOSIS — M1990 Unspecified osteoarthritis, unspecified site: Secondary | ICD-10-CM | POA: Diagnosis not present

## 2021-02-18 DIAGNOSIS — E0590 Thyrotoxicosis, unspecified without thyrotoxic crisis or storm: Secondary | ICD-10-CM | POA: Diagnosis not present

## 2021-02-18 DIAGNOSIS — I10 Essential (primary) hypertension: Secondary | ICD-10-CM | POA: Diagnosis not present

## 2021-02-18 DIAGNOSIS — N289 Disorder of kidney and ureter, unspecified: Secondary | ICD-10-CM | POA: Diagnosis not present

## 2021-02-18 DIAGNOSIS — M109 Gout, unspecified: Secondary | ICD-10-CM | POA: Diagnosis not present

## 2021-02-18 DIAGNOSIS — M130 Polyarthritis, unspecified: Secondary | ICD-10-CM | POA: Diagnosis not present

## 2021-02-18 DIAGNOSIS — M79671 Pain in right foot: Secondary | ICD-10-CM | POA: Diagnosis not present

## 2021-02-23 DIAGNOSIS — Z85118 Personal history of other malignant neoplasm of bronchus and lung: Secondary | ICD-10-CM | POA: Diagnosis not present

## 2021-02-23 DIAGNOSIS — E1169 Type 2 diabetes mellitus with other specified complication: Secondary | ICD-10-CM | POA: Diagnosis not present

## 2021-02-23 DIAGNOSIS — I480 Paroxysmal atrial fibrillation: Secondary | ICD-10-CM | POA: Diagnosis not present

## 2021-02-23 DIAGNOSIS — Z6826 Body mass index (BMI) 26.0-26.9, adult: Secondary | ICD-10-CM | POA: Diagnosis not present

## 2021-02-23 DIAGNOSIS — E0590 Thyrotoxicosis, unspecified without thyrotoxic crisis or storm: Secondary | ICD-10-CM | POA: Diagnosis not present

## 2021-02-23 DIAGNOSIS — Z853 Personal history of malignant neoplasm of breast: Secondary | ICD-10-CM | POA: Diagnosis not present

## 2021-02-23 DIAGNOSIS — R0781 Pleurodynia: Secondary | ICD-10-CM | POA: Diagnosis not present

## 2021-03-22 ENCOUNTER — Inpatient Hospital Stay: Payer: Medicare PPO | Attending: Internal Medicine

## 2021-03-22 ENCOUNTER — Encounter (HOSPITAL_COMMUNITY): Payer: Self-pay

## 2021-03-22 ENCOUNTER — Ambulatory Visit (HOSPITAL_COMMUNITY)
Admission: RE | Admit: 2021-03-22 | Discharge: 2021-03-22 | Disposition: A | Discharge status: Home / Self Care | Payer: Medicare PPO | Source: Ambulatory Visit | Attending: Internal Medicine | Admitting: Internal Medicine

## 2021-03-22 ENCOUNTER — Other Ambulatory Visit: Payer: Self-pay

## 2021-03-22 DIAGNOSIS — Z79899 Other long term (current) drug therapy: Secondary | ICD-10-CM | POA: Diagnosis not present

## 2021-03-22 DIAGNOSIS — R911 Solitary pulmonary nodule: Secondary | ICD-10-CM | POA: Diagnosis not present

## 2021-03-22 DIAGNOSIS — C3490 Malignant neoplasm of unspecified part of unspecified bronchus or lung: Secondary | ICD-10-CM

## 2021-03-22 DIAGNOSIS — Z7901 Long term (current) use of anticoagulants: Secondary | ICD-10-CM | POA: Diagnosis not present

## 2021-03-22 DIAGNOSIS — M2550 Pain in unspecified joint: Secondary | ICD-10-CM | POA: Insufficient documentation

## 2021-03-22 DIAGNOSIS — Z902 Acquired absence of lung [part of]: Secondary | ICD-10-CM | POA: Insufficient documentation

## 2021-03-22 DIAGNOSIS — R918 Other nonspecific abnormal finding of lung field: Secondary | ICD-10-CM | POA: Diagnosis not present

## 2021-03-22 DIAGNOSIS — I4891 Unspecified atrial fibrillation: Secondary | ICD-10-CM | POA: Diagnosis not present

## 2021-03-22 DIAGNOSIS — E0590 Thyrotoxicosis, unspecified without thyrotoxic crisis or storm: Secondary | ICD-10-CM | POA: Diagnosis not present

## 2021-03-22 DIAGNOSIS — I700 Atherosclerosis of aorta: Secondary | ICD-10-CM | POA: Diagnosis not present

## 2021-03-22 DIAGNOSIS — Z85118 Personal history of other malignant neoplasm of bronchus and lung: Secondary | ICD-10-CM | POA: Insufficient documentation

## 2021-03-22 LAB — CMP (CANCER CENTER ONLY)
ALT: 20 U/L (ref 0–44)
AST: 16 U/L (ref 15–41)
Albumin: 4.2 g/dL (ref 3.5–5.0)
Alkaline Phosphatase: 79 U/L (ref 38–126)
Anion gap: 6 (ref 5–15)
BUN: 26 mg/dL — ABNORMAL HIGH (ref 8–23)
CO2: 31 mmol/L (ref 22–32)
Calcium: 9.4 mg/dL (ref 8.9–10.3)
Chloride: 106 mmol/L (ref 98–111)
Creatinine: 1.41 mg/dL — ABNORMAL HIGH (ref 0.44–1.00)
GFR, Estimated: 37 mL/min — ABNORMAL LOW (ref >60)
Glucose, Bld: 155 mg/dL — ABNORMAL HIGH (ref 70–99)
Potassium: 4.6 mmol/L (ref 3.5–5.1)
Sodium: 143 mmol/L (ref 135–145)
Total Bilirubin: 0.9 mg/dL (ref 0.3–1.2)
Total Protein: 7.2 g/dL (ref 6.5–8.1)

## 2021-03-22 LAB — CBC WITH DIFFERENTIAL (CANCER CENTER ONLY)
Abs Immature Granulocytes: 0.03 10*3/uL (ref 0.00–0.07)
Basophils Absolute: 0 10*3/uL (ref 0.0–0.1)
Basophils Relative: 0 %
Eosinophils Absolute: 0.1 10*3/uL (ref 0.0–0.5)
Eosinophils Relative: 1 %
HCT: 40.9 % (ref 36.0–46.0)
Hemoglobin: 13.4 g/dL (ref 12.0–15.0)
Immature Granulocytes: 0 %
Lymphocytes Relative: 19 %
Lymphs Abs: 1.9 10*3/uL (ref 0.7–4.0)
MCH: 31 pg (ref 26.0–34.0)
MCHC: 32.8 g/dL (ref 30.0–36.0)
MCV: 94.7 fL (ref 80.0–100.0)
Monocytes Absolute: 0.6 10*3/uL (ref 0.1–1.0)
Monocytes Relative: 6 %
Neutro Abs: 7.3 10*3/uL (ref 1.7–7.7)
Neutrophils Relative %: 74 %
Platelet Count: 264 10*3/uL (ref 150–400)
RBC: 4.32 MIL/uL (ref 3.87–5.11)
RDW: 13.1 % (ref 11.5–15.5)
WBC Count: 9.9 10*3/uL (ref 4.0–10.5)
nRBC: 0 % (ref 0.0–0.2)

## 2021-03-22 MED ORDER — IOHEXOL 300 MG/ML  SOLN
100.0000 mL | Freq: Once | INTRAMUSCULAR | Status: AC | PRN
Start: 2021-03-22 — End: 2021-03-22
  Administered 2021-03-22: 60 mL via INTRAVENOUS

## 2021-03-22 MED ORDER — SODIUM CHLORIDE (PF) 0.9 % IJ SOLN
INTRAMUSCULAR | Status: AC
  Filled 2021-03-22: qty 50

## 2021-03-23 ENCOUNTER — Other Ambulatory Visit: Payer: Self-pay | Admitting: Internal Medicine

## 2021-03-23 ENCOUNTER — Inpatient Hospital Stay: Payer: Medicare PPO | Admitting: Internal Medicine

## 2021-03-23 VITALS — BP 133/65 | HR 64 | Temp 97.4°F | Resp 19 | Ht 63.0 in | Wt 149.4 lb

## 2021-03-23 DIAGNOSIS — E0590 Thyrotoxicosis, unspecified without thyrotoxic crisis or storm: Secondary | ICD-10-CM | POA: Diagnosis not present

## 2021-03-23 DIAGNOSIS — C3490 Malignant neoplasm of unspecified part of unspecified bronchus or lung: Secondary | ICD-10-CM

## 2021-03-23 DIAGNOSIS — C3491 Malignant neoplasm of unspecified part of right bronchus or lung: Secondary | ICD-10-CM | POA: Diagnosis not present

## 2021-03-23 DIAGNOSIS — C3431 Malignant neoplasm of lower lobe, right bronchus or lung: Secondary | ICD-10-CM | POA: Diagnosis not present

## 2021-03-23 DIAGNOSIS — Z902 Acquired absence of lung [part of]: Secondary | ICD-10-CM | POA: Diagnosis not present

## 2021-03-23 DIAGNOSIS — I4891 Unspecified atrial fibrillation: Secondary | ICD-10-CM | POA: Diagnosis not present

## 2021-03-23 DIAGNOSIS — Z79899 Other long term (current) drug therapy: Secondary | ICD-10-CM | POA: Diagnosis not present

## 2021-03-23 DIAGNOSIS — Z7901 Long term (current) use of anticoagulants: Secondary | ICD-10-CM | POA: Diagnosis not present

## 2021-03-23 DIAGNOSIS — Z85118 Personal history of other malignant neoplasm of bronchus and lung: Secondary | ICD-10-CM | POA: Diagnosis not present

## 2021-03-23 DIAGNOSIS — M2550 Pain in unspecified joint: Secondary | ICD-10-CM | POA: Diagnosis not present

## 2021-03-23 NOTE — Addendum Note (Signed)
Addended by: Claretha Cooper on: 03/23/2021 10:27 AM   Modules accepted: Orders

## 2021-03-23 NOTE — Progress Notes (Addendum)
[place] Telephone:([phone]) [phone]   Fax:(336) [phone]  OFFICE PROGRESS NOTE  Vanessa Neer, MD [address] [place] [address]  DIAGNOSIS: T1a (T1c, N0, M0) non-small cell lung cancer, adenocarcinoma presented with right lower lobe lung nodule in June 2020.  PRIOR THERAPY: Right video-assisted thoracoscopy, Wedge resection right lower lobe nodule, Thoracoscopic right lower lobectomy, Lymph node dissection on July 26, 2018 under the care of Dr. Roxan Hockey  CURRENT THERAPY: Observation.  INTERVAL HISTORY: Vanessa Day 81 y.o. female returns to the clinic today for follow-up visit.  The patient is feeling fine today with no concerning complaints except for arthralgia.  She was diagnosed with hyperthyroidism as well as atrial fibrillation recently.  They discontinued her treatment with Celebrex and she has more arthralgia since that time.  She denied having any chest pain, shortness of breath, cough or hemoptysis.  She has no nausea, vomiting, diarrhea or constipation.  She has no headache or visual changes.  She has no recent weight loss or night sweats.  The patient is here today for evaluation after repeating CT scan of the chest yesterday.  MEDICAL HISTORY: Past Medical History:  Diagnosis Date   Adenocarcinoma of lung, stage 1, right ([place]) 2020   Anxiety    Arthritis    back   Breast cancer ([place]) 2000   Right breast   Cancer ([place]) 2000   rt breast cancer   Family history of breast cancer    GERD (gastroesophageal reflux disease)    H/O hiatal hernia    Hyperlipidemia    Hypertension    IBS (irritable bowel syndrome)    Neck pain    PONV (postoperative nausea and vomiting)    Pre-diabetes     ALLERGIES:  is allergic to hydrocodone, codeine, dicyclomine hcl, hyoscyamine, statins, sudafed [pseudoephedrine], oxycodone-acetaminophen, and sulfa antibiotics.  MEDICATIONS:  Current Outpatient Medications  Medication Sig Dispense  Refill   acetaminophen (TYLENOL) 500 MG tablet Take 1 tablet (500 mg total) by mouth every 6 (six) hours as needed for mild pain. 30 tablet 0   alosetron (LOTRONEX) 0.5 MG tablet Take 0.5 mg by mouth as needed.     apixaban (ELIQUIS) 5 MG TABS tablet Take 5 mg by mouth 2 (two) times daily.     azelastine (ASTELIN) 0.1 % nasal spray Place 1 spray into both nostrils 2 (two) times daily. Use in each nostril as directed     azelastine (ASTELIN) 0.1 % nasal spray 1 puff in each nostril     calcium carbonate (OSCAL) 1500 (600 Ca) MG TABS tablet Take 600 mg of elemental calcium by mouth daily.     cholecalciferol (VITAMIN D3) 25 MCG (1000 UT) tablet Take 1,000 Units by mouth daily.     ezetimibe (ZETIA) 10 MG tablet Take 10 mg by mouth daily.     hydrochlorothiazide (MICROZIDE) 12.5 MG capsule Take 1 capsule (12.5 mg total) by mouth daily. 45 capsule 3   loratadine (CLARITIN) 10 MG tablet Take 10 mg by mouth as needed.     LORazepam (ATIVAN) 1 MG tablet Take 1-2 mg by mouth at bedtime.      losartan (COZAAR) 100 MG tablet Take 100 mg by mouth daily.     methimazole (TAPAZOLE) 10 MG tablet Take 20 mg by mouth daily.     metoprolol succinate (TOPROL-XL) 50 MG 24 hr tablet Take 1 tablet (50 mg total) by mouth daily. Take with or immediately following a meal.  90 tablet 3   Polyethyl Glycol-Propyl Glycol (SYSTANE OP) Place 1 drop into both eyes 2 (two) times daily as needed (dry eyes).     pregabalin (LYRICA) 50 MG capsule Take 50 mg by mouth 2 (two) times daily.      sertraline (ZOLOFT) 50 MG tablet Take 50 mg by mouth daily.     traMADol (ULTRAM) 50 MG tablet Take 50 mg by mouth 3 (three) times daily.     No current facility-administered medications for this visit.    SURGICAL HISTORY:  Past Surgical History:  Procedure Laterality Date   ABDOMINAL HYSTERECTOMY     APPENDECTOMY     BACK SURGERY     BREAST BIOPSY     BREAST SURGERY     CARPOMETACARPEL SUSPENSION PLASTY Left 08/25/2015   Procedure:  SUSPENSIONPLASTY LEFT THUMB TRAPEZIUM EXCISION ABDUCTOR POLLICIS LONGUS TRANSFER;  Surgeon: Daryll Brod, MD;  Location: [facility];  Service: Orthopedics;  Laterality: Left;   CHOLECYSTECTOMY     EYE SURGERY     cataract right and left eye   GAS INSERTION  06/23/2011   Procedure: INSERTION OF GAS;  Surgeon: Hayden Pedro, MD;  Location: [facility];  Service: Ophthalmology;  Laterality: Left;  SF6   LOBECTOMY Right 07/26/2018   Procedure: RIGHT LOWER LOBE LOBECTOMY;  Surgeon: [place] Nakayama, MD;  Location: [facility];  Service: Thoracic;  Laterality: Right;   MASTECTOMY     right breast   SCLERAL BUCKLE  06/23/2011   Procedure: SCLERAL BUCKLE;  Surgeon: Hayden Pedro, MD;  Location: [facility];  Service: Ophthalmology;  Laterality: Left;   SEGMENTECOMY Right 07/26/2018   Procedure: SEGMENTECTOMY;  Surgeon: [place] Nakayama, MD;  Location: [facility];  Service: Thoracic;  Laterality: Right;   TONSILLECTOMY     VIDEO ASSISTED THORACOSCOPY Right 07/26/2018   Procedure: VIDEO ASSISTED THORACOSCOPY;  Surgeon: [place] Nakayama, MD;  [facility];  Service: Thoracic;  Laterality: Right;    REVIEW OF SYSTEMS:  A comprehensive review of systems was negative except for: Musculoskeletal: positive for arthralgias   PHYSICAL EXAMINATION: General appearance: alert, cooperative, and no distress Head: Normocephalic, without obvious abnormality, atraumatic Neck: no adenopathy, no JVD, supple, symmetrical, trachea midline, and thyroid not enlarged, symmetric, no tenderness/mass/nodules Lymph nodes: Cervical, supraclavicular, and axillary nodes normal. Resp: clear to auscultation bilaterally Back: symmetric, no curvature. ROM normal. No CVA tenderness. Cardio: regular rate and rhythm, S1, S2 normal, no murmur, click, rub or gallop GI: soft, non-tender; bowel sounds normal; no masses,  no organomegaly Extremities: extremities normal, atraumatic, no cyanosis or edema  ECOG PERFORMANCE STATUS: 1 -  Symptomatic but completely ambulatory   Blood pressure 133/65, pulse 64, temperature (!) 97.4 F (36.3 C), temperature source Tympanic, resp. rate 19, height 5\' 3"  (1.6 m), weight 149 lb 6.4 oz (67.8 kg), SpO2 97 %.  LABORATORY DATA: Lab Results  Component Value Date   WBC 9.9 03/22/2021   HGB 13.4 03/22/2021   HCT 40.9 03/22/2021   MCV 94.7 03/22/2021   PLT 264 03/22/2021      Chemistry      Component Value Date/Time   NA 143 03/22/2021 0855   K 4.6 03/22/2021 0855   CL 106 03/22/2021 0855   CO2 31 03/22/2021 0855   BUN 26 (H) 03/22/2021 0855   CREATININE 1.41 (H) 03/22/2021 0855      Component Value Date/Time   CALCIUM 9.4 03/22/2021 0855   ALKPHOS 79 03/22/2021 0855   AST 16  03/22/2021 0855   ALT 20 03/22/2021 0855   BILITOT 0.9 03/22/2021 0855       RADIOGRAPHIC STUDIES: No results found.   ASSESSMENT AND PLAN: This is a very pleasant 81 years old white female recently diagnosed with a stage IA (T1c, N0, M0) non-small cell lung cancer, adenocarcinoma status post right lower lobectomy with lymph node dissection in July 2020. The patient has been on observation since that time and she is feeling fine today with no concerning complaints except for arthralgia. She had repeat CT scan of the chest performed yesterday.  I personally and independently reviewed the scan images and discussed the results with the patient.  The final report is still pending but I did not see any concerning findings for disease recurrence or metastasis.  I will wait for the final report for confirmation. I will arrange for the patient to come back for follow-up visit in 1 year with repeat CT scan of the chest for restaging of her disease. She was advised to call immediately if she has any other concerning symptoms in the interval. The patient voices understanding of current disease status and treatment options and is in agreement with the current care plan. All questions were answered. The patient  knows to call the clinic with any problems, questions or concerns. We can certainly see the patient much sooner if necessary.  Disclaimer: This note was dictated with voice recognition software. Similar sounding words can inadvertently be transcribed and may not be corrected upon review.  ADDENDUM: The scan results became available after the visit and it showed some suspicious pulmonary nodules that need to be monitored closely in a short interval. I will change her appointment to be done in 6 months with repeat CT scan of the chest and lab before the visit.

## 2021-03-25 ENCOUNTER — Telehealth: Payer: Self-pay

## 2021-03-25 NOTE — Telephone Encounter (Signed)
Pt requests her CT results dated 03/22/21. ? ?Pt was seen 03/23/21 and Dr. Ellan Lambert note states: ?ADDENDUM: ?The scan results became available after the visit and it showed some suspicious pulmonary nodules that need to be monitored closely in a short interval. ?I will change her appointment to be done in 6 months with repeat CT scan of the chest and lab before the visit. ? ?Discussed with Dr. Julien Nordmann who reiterated the aforementioned. This has been communicated to the pt and she expressed understanding of this information. ?

## 2021-04-04 NOTE — Progress Notes (Signed)
Cardiology Office Note:    Date:  04/05/2021   ID:  Vanessa Day, DOB Dec 17, 1940, MRN [id number]  PCP:  [name], MD  Cardiologist:  Sinclair Grooms, MD   Referring MD: [name], MD   Chief Complaint  Patient presents with   Atrial Fibrillation    History of Present Illness:    Vanessa Day is a 81 y.o. female with a hx of atrial fibrillation possibly related to hyperthyroidism, prediabetes, breast cancer, hyperlipidemia, hyperthyroidism, and anticoagulation therapy.    She is doing well.  She is still taking methimazole for suppression of hyperthyroidism.  She has not had any episodes of atrial fibrillation that have been identified with her wearable device.  A long-term monitor performed by [place] in October demonstrated atrial fibrillation less than 1% of the time with RVR.  No neurological complaints.  No bleeding complications on Eliquis.  Past Medical History:  Diagnosis Date   Adenocarcinoma of lung, stage 1, right ([place]) 2020   Anxiety    Arthritis    back   Breast cancer ([place]) 2000   Right breast   Cancer ([place]) 2000   rt breast cancer   Family history of breast cancer    GERD (gastroesophageal reflux disease)    H/O hiatal hernia    Hyperlipidemia    Hypertension    IBS (irritable bowel syndrome)    Neck pain    PONV (postoperative nausea and vomiting)    Pre-diabetes     Past Surgical History:  Procedure Laterality Date   ABDOMINAL HYSTERECTOMY     APPENDECTOMY     BACK SURGERY     BREAST BIOPSY     BREAST SURGERY     CARPOMETACARPEL SUSPENSION PLASTY Left 08/25/2015   Procedure: SUSPENSIONPLASTY LEFT THUMB TRAPEZIUM EXCISION ABDUCTOR POLLICIS LONGUS TRANSFER;  Surgeon: Daryll Brod, MD;  Location: [place];  Service: Orthopedics;  Laterality: Left;   CHOLECYSTECTOMY     EYE SURGERY     cataract right and left eye   GAS INSERTION  06/23/2011   Procedure: INSERTION OF GAS;  Surgeon: Hayden Pedro, MD;  Location: [place];  Service: Ophthalmology;  Laterality: Left;  SF6   LOBECTOMY Right 07/26/2018   Procedure: RIGHT LOWER LOBE LOBECTOMY;  Surgeon: Melrose Nakayama, MD;  Location: [place];  Service: Thoracic;  Laterality: Right;   MASTECTOMY     right breast   SCLERAL BUCKLE  06/23/2011   Procedure: SCLERAL BUCKLE;  Surgeon: Hayden Pedro, MD;  Location: [place];  Service: Ophthalmology;  Laterality: Left;   SEGMENTECOMY Right 07/26/2018   Procedure: SEGMENTECTOMY;  Surgeon: Melrose Nakayama, MD;  Location: [place];  Service: Thoracic;  Laterality: Right;   TONSILLECTOMY     VIDEO ASSISTED THORACOSCOPY Right 07/26/2018   Procedure: VIDEO ASSISTED THORACOSCOPY;  Surgeon: Melrose Nakayama, MD;  Location: [facility];  Service: Thoracic;  Laterality: Right;    Current Medications: Current Meds  Medication Sig   acetaminophen (TYLENOL) 500 MG tablet Take 1 tablet (500 mg total) by mouth every 6 (six) hours as needed for mild pain. (Patient taking differently: Take 1,000 mg by mouth every 6 (six) hours as needed for mild pain.)   alosetron (LOTRONEX) 0.5 MG tablet Take 0.5 mg by mouth as needed.   aspirin EC 81 MG tablet Take 1 tablet (81 mg total) by mouth daily. Swallow whole.   azelastine (ASTELIN) 0.1 % nasal spray Place 1 spray into both nostrils 2 (  two) times daily. Use in each nostril as directed   cetirizine (ZYRTEC) 10 MG tablet Take 10 mg by mouth daily.   ezetimibe (ZETIA) 10 MG tablet Take 10 mg by mouth daily.   hydrochlorothiazide (MICROZIDE) 12.5 MG capsule Take 1 capsule (12.5 mg total) by mouth daily.   LORazepam (ATIVAN) 1 MG tablet Take 1-2 mg by mouth at bedtime.    losartan (COZAAR) 100 MG tablet Take 100 mg by mouth daily.   methimazole (TAPAZOLE) 10 MG tablet Take 20 mg by mouth daily.   metoprolol succinate (TOPROL-XL) 50 MG 24 hr tablet Take 1 tablet (50 mg total) by mouth daily. Take with or immediately following a meal.   Polyethyl Glycol-Propyl Glycol (SYSTANE OP) Place 1 drop into  both eyes 2 (two) times daily as needed (dry eyes).   pregabalin (LYRICA) 50 MG capsule Take 50 mg by mouth 2 (two) times daily.    RESTASIS 0.05 % ophthalmic emulsion Place 1 drop into both eyes daily.   sertraline (ZOLOFT) 50 MG tablet Take 50 mg by mouth daily.   traMADol (ULTRAM) 50 MG tablet Take 50 mg by mouth 3 (three) times daily.   [DISCONTINUED] apixaban (ELIQUIS) 5 MG TABS tablet Take 5 mg by mouth 2 (two) times daily.     Allergies:   Hydrocodone, Codeine, Dicyclomine hcl, Hyoscyamine, Statins, Sudafed [pseudoephedrine], Oxycodone-acetaminophen, and Sulfa antibiotics   Social History   Socioeconomic History   Marital status: Married    Spouse name: Not on file   Number of children: Not on file   Years of education: Not on file   Highest education level: Not on file  Occupational History   Not on file  Tobacco Use   Smoking status: Former    Packs/day: 1.00    Years: 30.00    Pack years: 30.00    Types: Cigarettes    Quit date: 06/22/2002    Years since quitting: 18.8   Smokeless tobacco: Never  Vaping Use   Vaping Use: Never used  Substance and Sexual Activity   Alcohol use: Yes    Comment: rarely   Drug use: Yes    Types: Flunitrazepam   Sexual activity: Not Currently  Other Topics Concern   Not on file  Social History Narrative   Not on file   Social Determinants of Health   Financial Resource Strain: Not on file  Food Insecurity: Not on file  Transportation Needs: Not on file  Physical Activity: Not on file  Stress: Not on file  Social Connections: Not on file     Family History: The patient's family history includes Breast cancer in her mother; Breast cancer (age of onset: 83) in her daughter; COPD in her father; Cancer in her cousin; Cerebral palsy in her brother; Lung cancer in her maternal uncle; Prostate cancer in her father; Stroke in her maternal grandfather. There is no history of Anesthesia problems, Hypotension, Malignant hyperthermia, or  Pseudochol deficiency.  ROS:   Please see the history of present illness.    Significant arthritis.  Wants to be on Celebrex.  Quality of life is suffering.  All other systems reviewed and are negative.  EKGs/Labs/Other Studies Reviewed:    The following studies were reviewed today:   Prolonged Monitor 11/17/2020: Study Highlights  NSR Paroxysmal atrial fibrillation burden < 1 % with RVR up to 173 bpm. 10 beat WCT  EKG:  EKG not repeated  Recent Labs: 03/22/2021: ALT 20; BUN 26; Creatinine 1.41; Hemoglobin 13.4; Platelet Count  264; Potassium 4.6; Sodium 143  Recent Lipid Panel No results found for: CHOL, TRIG, HDL, CHOLHDL, VLDL, LDLCALC, LDLDIRECT  Physical Exam:    VS:  BP 102/72    Ht 5\' 3"  (1.6 m)    Wt 149 lb 3.2 oz (67.7 kg)    SpO2 98%    BMI 26.43 kg/m     Wt Readings from Last 3 Encounters:  04/05/21 149 lb 3.2 oz (67.7 kg)  03/23/21 149 lb 6.4 oz (67.8 kg)  10/02/20 148 lb 12.8 oz (67.5 kg)     GEN: Healthy appearing. No acute distress HEENT: Normal NECK: No JVD. LYMPHATICS: No lymphadenopathy CARDIAC: No murmur. RRR no gallop, or edema. VASCULAR:  Normal Pulses. No bruits. RESPIRATORY:  Clear to auscultation without rales, wheezing or rhonchi  ABDOMEN: Soft, non-tender, non-distended, No pulsatile mass, MUSCULOSKELETAL: No deformity  SKIN: Warm and dry NEUROLOGIC:  Alert and oriented x 3 PSYCHIATRIC:  Normal affect   ASSESSMENT:    1. New onset atrial fibrillation (HCC)   2. Coronary artery calcification seen on CT scan   3. Hyperthyroidism   4. Current use of long term anticoagulation    PLAN:    In order of problems listed above:  Low burden atrial fibrillation.  Likely fueled by the presence of hyperthyroidism which is now being treated.  With low burden and also a wearable device that will warn [place] if A-fib burden significantly increases, I feel it is safe to discontinue Eliquis.  The patient is lobbying hard to be off of Eliquis and understands  that there is a slight downstream risk of embolic events.  She will notify us if signals of atrial fibrillation occur.  I would substitute aspirin 81 mg daily. Aspirin 81 mg daily.  She wants to be on Celebrex.  She does have coronary artery calcification and could paradoxically have coronary clotting.  She will discuss whether Celebrex makes sense with her primary physician. Continue therapy with Ms. Theora Gianotti and under the guidance of her primary physician and endocrinologist. Discontinue Eliquis.  Start aspirin 81 mg/day.   Medication Adjustments/Labs and Tests Ordered: Current medicines are reviewed at length with the patient today.  Concerns regarding medicines are outlined above.  No orders of the defined types were placed in this encounter.  Meds ordered this encounter  Medications   aspirin EC 81 MG tablet    Sig: Take 1 tablet (81 mg total) by mouth daily. Swallow whole.    Dispense:  90 tablet    Refill:  3    Patient Instructions  Medication Instructions:  1) DISCONTINUE Eliquis 2) START Aspirin 81mg  once daily  *If you need a refill on your cardiac medications before your next appointment, please call your pharmacy*   Lab Work: None If you have labs (blood work) drawn today and your tests are completely normal, you will receive your results only by: [place] (if you have MyChart) OR A paper copy in the mail If you have any lab test that is abnormal or we need to change your treatment, we will call you to review the results.   Testing/Procedures: None   Follow-Up: At [facility], you and your health needs are our priority.  As part of our continuing mission to provide you with exceptional heart care, we have created designated Provider Care Teams.  These Care Teams include your primary Cardiologist (physician) and Advanced Practice Providers (APPs -  Physician Assistants and Nurse Practitioners) who all work together to provide you  with the care you  need, when you need it.  We recommend signing up for the patient portal called "MyChart".  Sign up information is provided on this After Visit Summary.  MyChart is used to connect with patients for Virtual Visits (Telemedicine).  Patients are able to view lab/test results, encounter notes, upcoming appointments, etc.  Non-urgent messages can be sent to your provider as well.   To learn more about what you can do with MyChart, go to [email or web address].    Your next appointment:   As needed  The format for your next appointment:   In Person  Provider:   Sinclair Grooms, MD     Other Instructions     Signed, Sinclair Grooms, MD  04/05/2021 9:24 AM    [place]

## 2021-04-05 ENCOUNTER — Encounter: Payer: Self-pay | Admitting: Interventional Cardiology

## 2021-04-05 ENCOUNTER — Ambulatory Visit: Payer: Medicare PPO | Admitting: Interventional Cardiology

## 2021-04-05 ENCOUNTER — Other Ambulatory Visit: Payer: Self-pay

## 2021-04-05 VITALS — BP 102/72 | Ht 63.0 in | Wt 149.2 lb

## 2021-04-05 DIAGNOSIS — I4891 Unspecified atrial fibrillation: Secondary | ICD-10-CM

## 2021-04-05 DIAGNOSIS — E0590 Thyrotoxicosis, unspecified without thyrotoxic crisis or storm: Secondary | ICD-10-CM | POA: Diagnosis not present

## 2021-04-05 DIAGNOSIS — I2510 Atherosclerotic heart disease of native coronary artery without angina pectoris: Secondary | ICD-10-CM

## 2021-04-05 DIAGNOSIS — Z7901 Long term (current) use of anticoagulants: Secondary | ICD-10-CM

## 2021-04-05 MED ORDER — ASPIRIN EC 81 MG PO TBEC: 81.0000 mg | DELAYED_RELEASE_TABLET | Freq: Every day | ORAL | 3 refills | Status: AC

## 2021-04-05 NOTE — Patient Instructions (Signed)
Medication Instructions:  ?1) DISCONTINUE Eliquis ?2) START Aspirin 81mg  once daily ? ?*If you need a refill on your cardiac medications before your next appointment, please call your pharmacy* ? ? ?Lab Work: ?None ?If you have labs (blood work) drawn today and your tests are completely normal, you will receive your results only by: ?MyChart Message (if you have MyChart) OR ?A paper copy in the mail ?If you have any lab test that is abnormal or we need to change your treatment, we will call you to review the results. ? ? ?Testing/Procedures: ?None ? ? ?Follow-Up: ?At [facility], you and your health needs are our priority.  As part of our continuing mission to provide you with exceptional heart care, we have created designated Provider Care Teams.  These Care Teams include your primary Cardiologist (physician) and Advanced Practice Providers (APPs -  Physician Assistants and Nurse Practitioners) who all work together to provide you with the care you need, when you need it. ? ?We recommend signing up for the patient portal called "MyChart".  Sign up information is provided on this After Visit Summary.  MyChart is used to connect with patients for Virtual Visits (Telemedicine).  Patients are able to view lab/test results, encounter notes, upcoming appointments, etc.  Non-urgent messages can be sent to your provider as well.   ?To learn more about what you can do with MyChart, go to [email or web address].   ? ?Your next appointment:   ?As needed ? ?The format for your next appointment:   ?In Person ? ?Provider:   ?Sinclair Grooms, MD   ? ? ?Other Instructions ?  ?

## 2021-04-07 DIAGNOSIS — E0590 Thyrotoxicosis, unspecified without thyrotoxic crisis or storm: Secondary | ICD-10-CM | POA: Diagnosis not present

## 2021-04-07 DIAGNOSIS — I480 Paroxysmal atrial fibrillation: Secondary | ICD-10-CM | POA: Diagnosis not present

## 2021-04-07 DIAGNOSIS — Z853 Personal history of malignant neoplasm of breast: Secondary | ICD-10-CM | POA: Diagnosis not present

## 2021-04-07 DIAGNOSIS — Z85118 Personal history of other malignant neoplasm of bronchus and lung: Secondary | ICD-10-CM | POA: Diagnosis not present

## 2021-04-07 DIAGNOSIS — Z6826 Body mass index (BMI) 26.0-26.9, adult: Secondary | ICD-10-CM | POA: Diagnosis not present

## 2021-04-07 DIAGNOSIS — E1169 Type 2 diabetes mellitus with other specified complication: Secondary | ICD-10-CM | POA: Diagnosis not present

## 2021-04-13 DIAGNOSIS — M1990 Unspecified osteoarthritis, unspecified site: Secondary | ICD-10-CM | POA: Diagnosis not present

## 2021-04-13 DIAGNOSIS — N289 Disorder of kidney and ureter, unspecified: Secondary | ICD-10-CM | POA: Diagnosis not present

## 2021-04-13 DIAGNOSIS — I480 Paroxysmal atrial fibrillation: Secondary | ICD-10-CM | POA: Diagnosis not present

## 2021-04-13 DIAGNOSIS — M5136 Other intervertebral disc degeneration, lumbar region: Secondary | ICD-10-CM | POA: Diagnosis not present

## 2021-04-13 DIAGNOSIS — Z8739 Personal history of other diseases of the musculoskeletal system and connective tissue: Secondary | ICD-10-CM | POA: Diagnosis not present

## 2021-04-13 DIAGNOSIS — I10 Essential (primary) hypertension: Secondary | ICD-10-CM | POA: Diagnosis not present

## 2021-04-13 DIAGNOSIS — E0590 Thyrotoxicosis, unspecified without thyrotoxic crisis or storm: Secondary | ICD-10-CM | POA: Diagnosis not present

## 2021-05-10 ENCOUNTER — Other Ambulatory Visit: Payer: Self-pay | Admitting: Family Medicine

## 2021-05-10 DIAGNOSIS — F3341 Major depressive disorder, recurrent, in partial remission: Secondary | ICD-10-CM | POA: Diagnosis not present

## 2021-05-10 DIAGNOSIS — E782 Mixed hyperlipidemia: Secondary | ICD-10-CM | POA: Diagnosis not present

## 2021-05-10 DIAGNOSIS — K589 Irritable bowel syndrome without diarrhea: Secondary | ICD-10-CM | POA: Diagnosis not present

## 2021-05-10 DIAGNOSIS — M8580 Other specified disorders of bone density and structure, unspecified site: Secondary | ICD-10-CM

## 2021-05-10 DIAGNOSIS — I480 Paroxysmal atrial fibrillation: Secondary | ICD-10-CM | POA: Diagnosis not present

## 2021-05-10 DIAGNOSIS — I10 Essential (primary) hypertension: Secondary | ICD-10-CM | POA: Diagnosis not present

## 2021-05-10 DIAGNOSIS — E1169 Type 2 diabetes mellitus with other specified complication: Secondary | ICD-10-CM | POA: Diagnosis not present

## 2021-05-10 DIAGNOSIS — C3491 Malignant neoplasm of unspecified part of right bronchus or lung: Secondary | ICD-10-CM | POA: Diagnosis not present

## 2021-05-10 DIAGNOSIS — M159 Polyosteoarthritis, unspecified: Secondary | ICD-10-CM | POA: Diagnosis not present

## 2021-05-10 DIAGNOSIS — Z0000 Encounter for general adult medical examination without abnormal findings: Secondary | ICD-10-CM | POA: Diagnosis not present

## 2021-05-18 ENCOUNTER — Other Ambulatory Visit: Payer: Self-pay | Admitting: Family Medicine

## 2021-05-18 ENCOUNTER — Other Ambulatory Visit: Payer: Self-pay

## 2021-05-18 DIAGNOSIS — Z1231 Encounter for screening mammogram for malignant neoplasm of breast: Secondary | ICD-10-CM

## 2021-06-01 DIAGNOSIS — I951 Orthostatic hypotension: Secondary | ICD-10-CM | POA: Diagnosis not present

## 2021-06-01 DIAGNOSIS — R42 Dizziness and giddiness: Secondary | ICD-10-CM | POA: Diagnosis not present

## 2021-08-02 ENCOUNTER — Ambulatory Visit
Admission: RE | Admit: 2021-08-02 | Discharge: 2021-08-02 | Disposition: A | Discharge status: Home / Self Care | Payer: Medicare PPO | Source: Ambulatory Visit | Attending: Family Medicine | Admitting: Family Medicine

## 2021-08-02 DIAGNOSIS — Z1231 Encounter for screening mammogram for malignant neoplasm of breast: Secondary | ICD-10-CM

## 2021-08-05 DIAGNOSIS — E0590 Thyrotoxicosis, unspecified without thyrotoxic crisis or storm: Secondary | ICD-10-CM | POA: Diagnosis not present

## 2021-08-10 DIAGNOSIS — M545 Low back pain, unspecified: Secondary | ICD-10-CM | POA: Diagnosis not present

## 2021-08-12 DIAGNOSIS — I480 Paroxysmal atrial fibrillation: Secondary | ICD-10-CM | POA: Diagnosis not present

## 2021-08-12 DIAGNOSIS — E0590 Thyrotoxicosis, unspecified without thyrotoxic crisis or storm: Secondary | ICD-10-CM | POA: Diagnosis not present

## 2021-08-13 DIAGNOSIS — F32A Depression, unspecified: Secondary | ICD-10-CM | POA: Diagnosis not present

## 2021-08-13 DIAGNOSIS — G4700 Insomnia, unspecified: Secondary | ICD-10-CM | POA: Diagnosis not present

## 2021-08-13 DIAGNOSIS — M1990 Unspecified osteoarthritis, unspecified site: Secondary | ICD-10-CM | POA: Diagnosis not present

## 2021-08-13 DIAGNOSIS — G629 Polyneuropathy, unspecified: Secondary | ICD-10-CM | POA: Diagnosis not present

## 2021-08-13 DIAGNOSIS — J309 Allergic rhinitis, unspecified: Secondary | ICD-10-CM | POA: Diagnosis not present

## 2021-08-13 DIAGNOSIS — E0590 Thyrotoxicosis, unspecified without thyrotoxic crisis or storm: Secondary | ICD-10-CM | POA: Diagnosis not present

## 2021-08-13 DIAGNOSIS — E785 Hyperlipidemia, unspecified: Secondary | ICD-10-CM | POA: Diagnosis not present

## 2021-08-13 DIAGNOSIS — F419 Anxiety disorder, unspecified: Secondary | ICD-10-CM | POA: Diagnosis not present

## 2021-08-13 DIAGNOSIS — I10 Essential (primary) hypertension: Secondary | ICD-10-CM | POA: Diagnosis not present

## 2021-08-18 ENCOUNTER — Telehealth: Payer: Self-pay | Admitting: Internal Medicine

## 2021-08-18 DIAGNOSIS — Z8739 Personal history of other diseases of the musculoskeletal system and connective tissue: Secondary | ICD-10-CM | POA: Diagnosis not present

## 2021-08-18 DIAGNOSIS — M1990 Unspecified osteoarthritis, unspecified site: Secondary | ICD-10-CM | POA: Diagnosis not present

## 2021-08-18 DIAGNOSIS — M5136 Other intervertebral disc degeneration, lumbar region: Secondary | ICD-10-CM | POA: Diagnosis not present

## 2021-08-18 DIAGNOSIS — I480 Paroxysmal atrial fibrillation: Secondary | ICD-10-CM | POA: Diagnosis not present

## 2021-08-18 DIAGNOSIS — I10 Essential (primary) hypertension: Secondary | ICD-10-CM | POA: Diagnosis not present

## 2021-08-18 DIAGNOSIS — M130 Polyarthritis, unspecified: Secondary | ICD-10-CM | POA: Diagnosis not present

## 2021-08-18 DIAGNOSIS — E0590 Thyrotoxicosis, unspecified without thyrotoxic crisis or storm: Secondary | ICD-10-CM | POA: Diagnosis not present

## 2021-08-18 DIAGNOSIS — N289 Disorder of kidney and ureter, unspecified: Secondary | ICD-10-CM | POA: Diagnosis not present

## 2021-08-18 DIAGNOSIS — M109 Gout, unspecified: Secondary | ICD-10-CM | POA: Diagnosis not present

## 2021-08-18 NOTE — Telephone Encounter (Signed)
Called patient regarding upcoming August appointment, left a voicemail. 

## 2021-09-14 ENCOUNTER — Ambulatory Visit (HOSPITAL_COMMUNITY)
Admission: RE | Admit: 2021-09-14 | Discharge: 2021-09-14 | Disposition: A | Discharge status: Home / Self Care | Payer: Medicare PPO | Source: Ambulatory Visit | Attending: Internal Medicine | Admitting: Internal Medicine

## 2021-09-14 ENCOUNTER — Other Ambulatory Visit: Payer: Self-pay

## 2021-09-14 ENCOUNTER — Inpatient Hospital Stay: Payer: Medicare PPO | Attending: Internal Medicine

## 2021-09-14 DIAGNOSIS — Z9011 Acquired absence of right breast and nipple: Secondary | ICD-10-CM | POA: Insufficient documentation

## 2021-09-14 DIAGNOSIS — C3490 Malignant neoplasm of unspecified part of unspecified bronchus or lung: Secondary | ICD-10-CM | POA: Diagnosis not present

## 2021-09-14 DIAGNOSIS — Z853 Personal history of malignant neoplasm of breast: Secondary | ICD-10-CM | POA: Insufficient documentation

## 2021-09-14 DIAGNOSIS — Z79899 Other long term (current) drug therapy: Secondary | ICD-10-CM | POA: Insufficient documentation

## 2021-09-14 DIAGNOSIS — Z7982 Long term (current) use of aspirin: Secondary | ICD-10-CM | POA: Insufficient documentation

## 2021-09-14 DIAGNOSIS — C3431 Malignant neoplasm of lower lobe, right bronchus or lung: Secondary | ICD-10-CM | POA: Insufficient documentation

## 2021-09-14 DIAGNOSIS — R059 Cough, unspecified: Secondary | ICD-10-CM | POA: Insufficient documentation

## 2021-09-14 DIAGNOSIS — R918 Other nonspecific abnormal finding of lung field: Secondary | ICD-10-CM | POA: Diagnosis not present

## 2021-09-14 DIAGNOSIS — Z902 Acquired absence of lung [part of]: Secondary | ICD-10-CM | POA: Insufficient documentation

## 2021-09-14 LAB — CBC WITH DIFFERENTIAL (CANCER CENTER ONLY)
Abs Immature Granulocytes: 0.03 10*3/uL (ref 0.00–0.07)
Basophils Absolute: 0 10*3/uL (ref 0.0–0.1)
Basophils Relative: 0 %
Eosinophils Absolute: 0.1 10*3/uL (ref 0.0–0.5)
Eosinophils Relative: 1 %
HCT: 40.5 % (ref 36.0–46.0)
Hemoglobin: 13.8 g/dL (ref 12.0–15.0)
Immature Granulocytes: 0 %
Lymphocytes Relative: 24 %
Lymphs Abs: 2.2 10*3/uL (ref 0.7–4.0)
MCH: 31.6 pg (ref 26.0–34.0)
MCHC: 34.1 g/dL (ref 30.0–36.0)
MCV: 92.7 fL (ref 80.0–100.0)
Monocytes Absolute: 0.7 10*3/uL (ref 0.1–1.0)
Monocytes Relative: 8 %
Neutro Abs: 6.2 10*3/uL (ref 1.7–7.7)
Neutrophils Relative %: 67 %
Platelet Count: 274 10*3/uL (ref 150–400)
RBC: 4.37 MIL/uL (ref 3.87–5.11)
RDW: 13.3 % (ref 11.5–15.5)
WBC Count: 9.3 10*3/uL (ref 4.0–10.5)
nRBC: 0 % (ref 0.0–0.2)

## 2021-09-14 LAB — CMP (CANCER CENTER ONLY)
ALT: 12 U/L (ref 0–44)
AST: 12 U/L — ABNORMAL LOW (ref 15–41)
Albumin: 4.4 g/dL (ref 3.5–5.0)
Alkaline Phosphatase: 74 U/L (ref 38–126)
Anion gap: 4 — ABNORMAL LOW (ref 5–15)
BUN: 27 mg/dL — ABNORMAL HIGH (ref 8–23)
CO2: 31 mmol/L (ref 22–32)
Calcium: 9.4 mg/dL (ref 8.9–10.3)
Chloride: 104 mmol/L (ref 98–111)
Creatinine: 1.4 mg/dL — ABNORMAL HIGH (ref 0.44–1.00)
GFR, Estimated: 38 mL/min — ABNORMAL LOW (ref >60)
Glucose, Bld: 138 mg/dL — ABNORMAL HIGH (ref 70–99)
Potassium: 3.9 mmol/L (ref 3.5–5.1)
Sodium: 139 mmol/L (ref 135–145)
Total Bilirubin: 1 mg/dL (ref 0.3–1.2)
Total Protein: 7 g/dL (ref 6.5–8.1)

## 2021-09-16 ENCOUNTER — Other Ambulatory Visit: Payer: Self-pay

## 2021-09-16 ENCOUNTER — Inpatient Hospital Stay: Payer: Medicare PPO | Admitting: Internal Medicine

## 2021-09-16 VITALS — BP 134/63 | HR 81 | Temp 98.4°F | Resp 15 | Wt 155.8 lb

## 2021-09-16 DIAGNOSIS — Z902 Acquired absence of lung [part of]: Secondary | ICD-10-CM | POA: Diagnosis not present

## 2021-09-16 DIAGNOSIS — Z853 Personal history of malignant neoplasm of breast: Secondary | ICD-10-CM | POA: Diagnosis not present

## 2021-09-16 DIAGNOSIS — C3490 Malignant neoplasm of unspecified part of unspecified bronchus or lung: Secondary | ICD-10-CM | POA: Diagnosis not present

## 2021-09-16 DIAGNOSIS — C3431 Malignant neoplasm of lower lobe, right bronchus or lung: Secondary | ICD-10-CM | POA: Diagnosis not present

## 2021-09-16 DIAGNOSIS — Z7982 Long term (current) use of aspirin: Secondary | ICD-10-CM | POA: Diagnosis not present

## 2021-09-16 DIAGNOSIS — R059 Cough, unspecified: Secondary | ICD-10-CM | POA: Diagnosis not present

## 2021-09-16 DIAGNOSIS — Z9011 Acquired absence of right breast and nipple: Secondary | ICD-10-CM | POA: Diagnosis not present

## 2021-09-16 DIAGNOSIS — Z79899 Other long term (current) drug therapy: Secondary | ICD-10-CM | POA: Diagnosis not present

## 2021-09-16 MED ORDER — DOXYCYCLINE HYCLATE 100 MG PO TABS: 100.0000 mg | ORAL_TABLET | Freq: Two times a day (BID) | ORAL | 0 refills | Status: DC

## 2021-09-16 NOTE — Progress Notes (Signed)
[place] Telephone:([phone]   Fax:([phone]) [phone]  OFFICE PROGRESS NOTE  [name], MD [address] [place] [address]  DIAGNOSIS: T1a (T1c, N0, M0) non-small cell lung cancer, adenocarcinoma presented with right lower lobe lung nodule in June 2020.  PRIOR THERAPY: Right video-assisted thoracoscopy, Wedge resection right lower lobe nodule, Thoracoscopic right lower lobectomy, Lymph node dissection on July 26, 2018 under the care of Dr. Roxan Hockey  CURRENT THERAPY: Observation.  INTERVAL HISTORY: Vanessa Day 81 y.o. female returns to the clinic today for follow-up visit accompanied by her husband.  The patient is feeling fine today with no concerning complaints except for dry cough.  She denied having any chest pain, shortness of breath or hemoptysis.  She has no nausea, vomiting, diarrhea or constipation.  She has no headache or visual changes.  She denied having any fever or chills.  She is here today for evaluation with repeat CT scan of the chest for restaging of her disease.    MEDICAL HISTORY: Past Medical History:  Diagnosis Date   Adenocarcinoma of lung, stage 1, right ([place]) 2020   Anxiety    Arthritis    back   Breast cancer ([place]) 2000   Right breast   Cancer ([place]) 2000   rt breast cancer   Family history of breast cancer    GERD (gastroesophageal reflux disease)    H/O hiatal hernia    Hyperlipidemia    Hypertension    IBS (irritable bowel syndrome)    Neck pain    PONV (postoperative nausea and vomiting)    Pre-diabetes     ALLERGIES:  is allergic to hydrocodone, codeine, dicyclomine hcl, hyoscyamine, statins, sudafed [pseudoephedrine], oxycodone-acetaminophen, and sulfa antibiotics.  MEDICATIONS:  Current Outpatient Medications  Medication Sig Dispense Refill   methimazole (TAPAZOLE) 5 MG tablet Take 5 mg by mouth daily.     acetaminophen (TYLENOL) 500 MG tablet Take 1 tablet (500 mg total) by mouth  every 6 (six) hours as needed for mild pain. (Patient taking differently: Take 1,000 mg by mouth every 6 (six) hours as needed for mild pain.) 30 tablet 0   alosetron (LOTRONEX) 0.5 MG tablet Take 0.5 mg by mouth as needed.     aspirin EC 81 MG tablet Take 1 tablet (81 mg total) by mouth daily. Swallow whole. 90 tablet 3   azelastine (ASTELIN) 0.1 % nasal spray Place 1 spray into both nostrils 2 (two) times daily. Use in each nostril as directed     celecoxib (CELEBREX) 200 MG capsule Take 200 mg by mouth daily.     cetirizine (ZYRTEC) 10 MG tablet Take 10 mg by mouth daily.     ezetimibe (ZETIA) 10 MG tablet Take 10 mg by mouth daily.     hydrochlorothiazide (MICROZIDE) 12.5 MG capsule Take 1 capsule (12.5 mg total) by mouth daily. 45 capsule 3   LORazepam (ATIVAN) 1 MG tablet Take 1-2 mg by mouth at bedtime.      losartan (COZAAR) 100 MG tablet Take 100 mg by mouth daily.     Polyethyl Glycol-Propyl Glycol (SYSTANE OP) Place 1 drop into both eyes 2 (two) times daily as needed (dry eyes).     pregabalin (LYRICA) 50 MG capsule Take 50 mg by mouth 2 (two) times daily.      sertraline (ZOLOFT) 50 MG tablet Take 50 mg by mouth daily.     No current facility-administered medications for this visit.  SURGICAL HISTORY:  Past Surgical History:  Procedure Laterality Date   ABDOMINAL HYSTERECTOMY     APPENDECTOMY     BACK SURGERY     BREAST BIOPSY     BREAST SURGERY     CARPOMETACARPEL SUSPENSION PLASTY Left 08/25/2015   Procedure: SUSPENSIONPLASTY LEFT THUMB TRAPEZIUM EXCISION ABDUCTOR POLLICIS LONGUS TRANSFER;  Surgeon: Daryll Brod, MD;  Location: [place];  Service: Orthopedics;  Laterality: Left;   CHOLECYSTECTOMY     EYE SURGERY     cataract right and left eye   GAS INSERTION  06/23/2011   Procedure: INSERTION OF GAS;  Surgeon: Hayden Pedro, MD;  Location: [place];  Service: Ophthalmology;  Laterality: Left;  SF6   LOBECTOMY Right 07/26/2018   Procedure: RIGHT LOWER LOBE  LOBECTOMY;  Surgeon: Melrose Nakayama, MD;  Location: [place];  Service: Thoracic;  Laterality: Right;   MASTECTOMY     right breast   SCLERAL BUCKLE  06/23/2011   Procedure: SCLERAL BUCKLE;  Surgeon: Hayden Pedro, MD;  Location: [place];  Service: Ophthalmology;  Laterality: Left;   SEGMENTECOMY Right 07/26/2018   Procedure: SEGMENTECTOMY;  Surgeon: Melrose Nakayama, MD;  Location: [place];  Service: Thoracic;  Laterality: Right;   TONSILLECTOMY     VIDEO ASSISTED THORACOSCOPY Right 07/26/2018   Procedure: VIDEO ASSISTED THORACOSCOPY;  Surgeon: Melrose Nakayama, MD;  Location: [facility];  Service: Thoracic;  Laterality: Right;    REVIEW OF SYSTEMS:  A comprehensive review of systems was negative except for: Respiratory: positive for cough   PHYSICAL EXAMINATION: General appearance: alert, cooperative, and no distress Head: Normocephalic, without obvious abnormality, atraumatic Neck: no adenopathy, no JVD, supple, symmetrical, trachea midline, and thyroid not enlarged, symmetric, no tenderness/mass/nodules Lymph nodes: Cervical, supraclavicular, and axillary nodes normal. Resp: clear to auscultation bilaterally Back: symmetric, no curvature. ROM normal. No CVA tenderness. Cardio: regular rate and rhythm, S1, S2 normal, no murmur, click, rub or gallop GI: soft, non-tender; bowel sounds normal; no masses,  no organomegaly Extremities: extremities normal, atraumatic, no cyanosis or edema  ECOG PERFORMANCE STATUS: 1 - Symptomatic but completely ambulatory   Blood pressure 134/63, pulse 81, temperature 98.4 F (36.9 C), temperature source Oral, resp. rate 15, weight 155 lb 12.8 oz (70.7 kg), SpO2 96 %.  LABORATORY DATA: Lab Results  Component Value Date   WBC 9.3 09/14/2021   HGB 13.8 09/14/2021   HCT 40.5 09/14/2021   MCV 92.7 09/14/2021   PLT 274 09/14/2021      Chemistry      Component Value Date/Time   NA 139 09/14/2021 1025   K 3.9 09/14/2021 1025   CL 104 09/14/2021  1025   CO2 31 09/14/2021 1025   BUN 27 (H) 09/14/2021 1025   CREATININE 1.40 (H) 09/14/2021 1025      Component Value Date/Time   CALCIUM 9.4 09/14/2021 1025   ALKPHOS 74 09/14/2021 1025   AST 12 (L) 09/14/2021 1025   ALT 12 09/14/2021 1025   BILITOT 1.0 09/14/2021 1025       RADIOGRAPHIC STUDIES: CT Chest Wo Contrast  Result Date: 09/15/2021 CLINICAL DATA:  A female at age 18 presents for follow-up of non-small cell lung cancer. * Tracking Code: BO * EXAM: CT CHEST WITHOUT CONTRAST TECHNIQUE: Multidetector CT imaging of the chest was performed following the standard protocol without IV contrast. RADIATION DOSE REDUCTION: This exam was performed according to the departmental dose-optimization program which includes automated exposure control, adjustment of the mA and/or  kV according to patient size and/or use of iterative reconstruction technique. COMPARISON:  March 22, 2021 FINDINGS: Cardiovascular: Calcified aortic atherosclerosis. No aneurysmal dilation of the thoracic aorta. Three-vessel coronary artery disease. Normal caliber of central pulmonary vessels. Limited assessment of cardiovascular structures given lack of intravenous contrast. Mediastinum/Nodes: Post RIGHT axillary dissection. No signs of adenopathy in the chest. There is however a precarinal lymph node along the lower RIGHT paratracheal chain that measures a cm with rounded morphology, 8-9 mm on the previous exam of February of 2023 and 4-5 mm in August of 2021. No internal mammary adenopathy. No thoracic inlet adenopathy. No axillary adenopathy. Cardiophrenic lymph nodes with increased conspicuity on image 102/2, largest approximately 6 mm. There were no lymph nodes in this area or nodules on previous imaging from 2021 and only very small lymph nodes in this area on most recent comparison imaging. Esophagus grossly normal by CT. Lungs/Pleura: Increasingly nodular appearance of fissural reflections in the RIGHT chest following  prior RIGHT lower lobectomy. (Image 81/7) 5 mm nodule previously approximately 3 mm now nearly confluent with adjacent 5 mm nodule that was previously also proximally 3 mm. Peri fissural nodule in the RIGHT mid chest (image 85/7) 4 mm which is stable. Juxta diaphragmatic nodularity (image 98/7) 7 mm stable. Juxta diaphragmatic nodule (image 100/7) 5 mm, previously approximately 4 mm. New pleural base nodule RIGHT lower lobe (image 109/7) 4 mm, nearly 5 mm. Juxtapleural nodules in the posterior RIGHT chest with some increase in size albeit slight, largest approximately 6 mm (image 50/7) previously 3-4 mm. Airways are patent. Upper Abdomen: Moderate hepatic steatosis. Lobular hepatic contours with fissural widening. Liver incompletely imaged. Post cholecystectomy. No acute findings relative to pancreas, spleen, adrenal glands or of the kidneys. Adrenal glands are normal. Musculoskeletal: Post RIGHT mastectomy and breast reconstruction with axillary dissection. No acute or destructive bone process. IMPRESSION: 1. Increasingly nodular appearance of pleural surfaces in the RIGHT chest, ill-defined nodular areas in the cardiophrenic recess and increasing size of lymph nodes in the mediastinum. Constellation of findings could be seen in the setting of disease recurrence/metastasis. PET scan may be helpful for further evaluation in this patient with history of breast and lung cancer. 2. Moderate hepatic steatosis with lobular hepatic contours and fissural widening. 3. Aortic atherosclerosis and coronary artery disease. Aortic Atherosclerosis (ICD10-I70.0). Electronically Signed   By: Zetta Bills M.D.   On: 09/15/2021 17:09     ASSESSMENT AND PLAN: This is a very pleasant 81 years old white female recently diagnosed with a stage IA (T1c, N0, M0) non-small cell lung cancer, adenocarcinoma status post right lower lobectomy with lymph node dissection in July 2020. The patient is currently on observation and she is  feeling fine except for the dry cough. She had repeat CT scan of the chest performed recently.  I personally and independently reviewed the scan images and discussed the result with the patient and her husband. Her scan showed increasing nodularity of the pleural surface in the right chest with ill-defined areas in the cardiophrenic recess and increasing size of lymph nodes in the mediastinum. I recommended for the patient to have a PET scan for further evaluation of this condition. If the PET scan is positive, I will refer the patient to Dr. Roxan Hockey for consideration of bronchoscopy and tissue diagnosis before discussion of her treatment options. The patient will come back for follow-up visit in around 2 weeks for evaluation and discussion of her PET scan and further recommendation regarding her condition. The  patient was advised to call immediately if she has any other concerning symptoms in the interval. The patient voices understanding of current disease status and treatment options and is in agreement with the current care plan. All questions were answered. The patient knows to call the clinic with any problems, questions or concerns. We can certainly see the patient much sooner if necessary.  Disclaimer: This note was dictated with voice recognition software. Similar sounding words can inadvertently be transcribed and may not be corrected upon review.

## 2021-09-21 ENCOUNTER — Telehealth: Payer: Self-pay | Admitting: Internal Medicine

## 2021-09-21 NOTE — Telephone Encounter (Signed)
Scheduled per 08/24 los, patient has been called and notified.

## 2021-09-23 ENCOUNTER — Telehealth: Payer: Self-pay | Admitting: Internal Medicine

## 2021-09-23 NOTE — Telephone Encounter (Signed)
Called patient regarding 09/14 appointment change, patient is notified.

## 2021-09-27 IMAGING — MG DIGITAL SCREENING UNILAT LEFT W/ TOMO W/ CAD
6 series · 6 of 18 positions shown · non-contrast
Comparison: Previous exam(s).

CLINICAL DATA: Screening.

EXAM:
DIGITAL SCREENING UNILATERAL LEFT MAMMOGRAM WITH CAD AND
TOMOSYNTHESIS
TECHNIQUE: Left screening digital craniocaudal and mediolateral oblique
mammograms were obtained. Left screening digital breast
tomosynthesis was performed. The images were evaluated with
computer-aided detection.

[L CC synth-2D (1 of 2)]
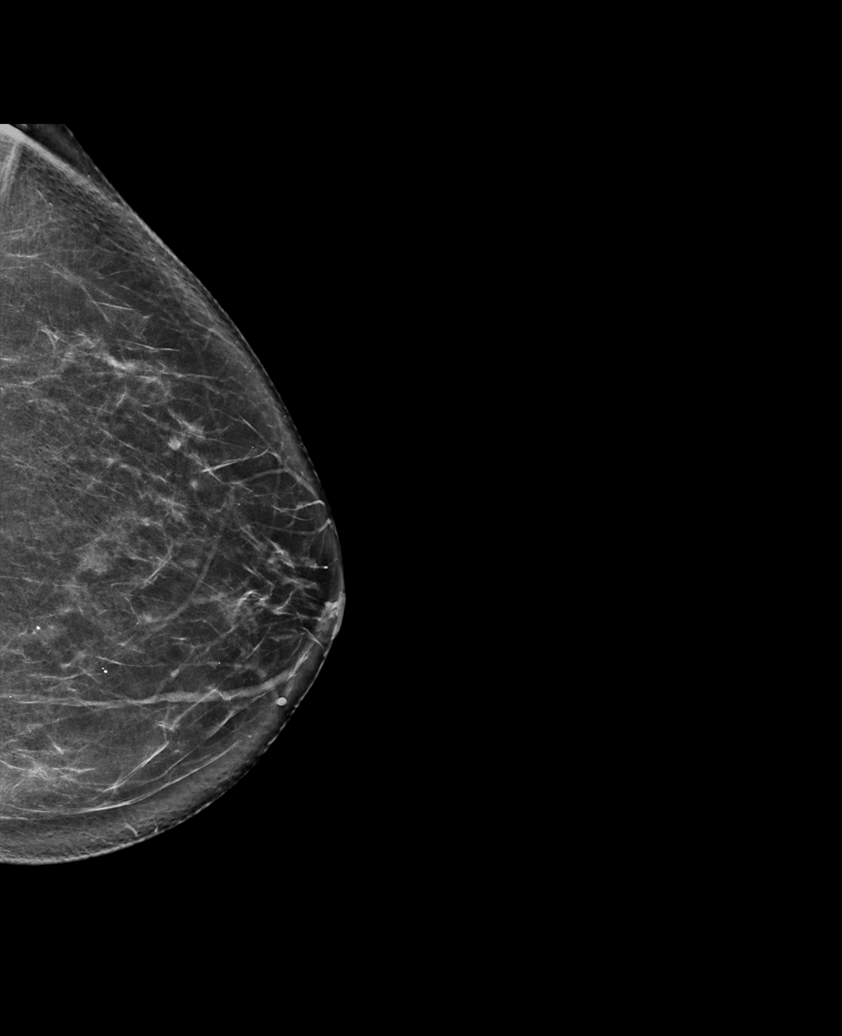

[L MLO synth-2D]
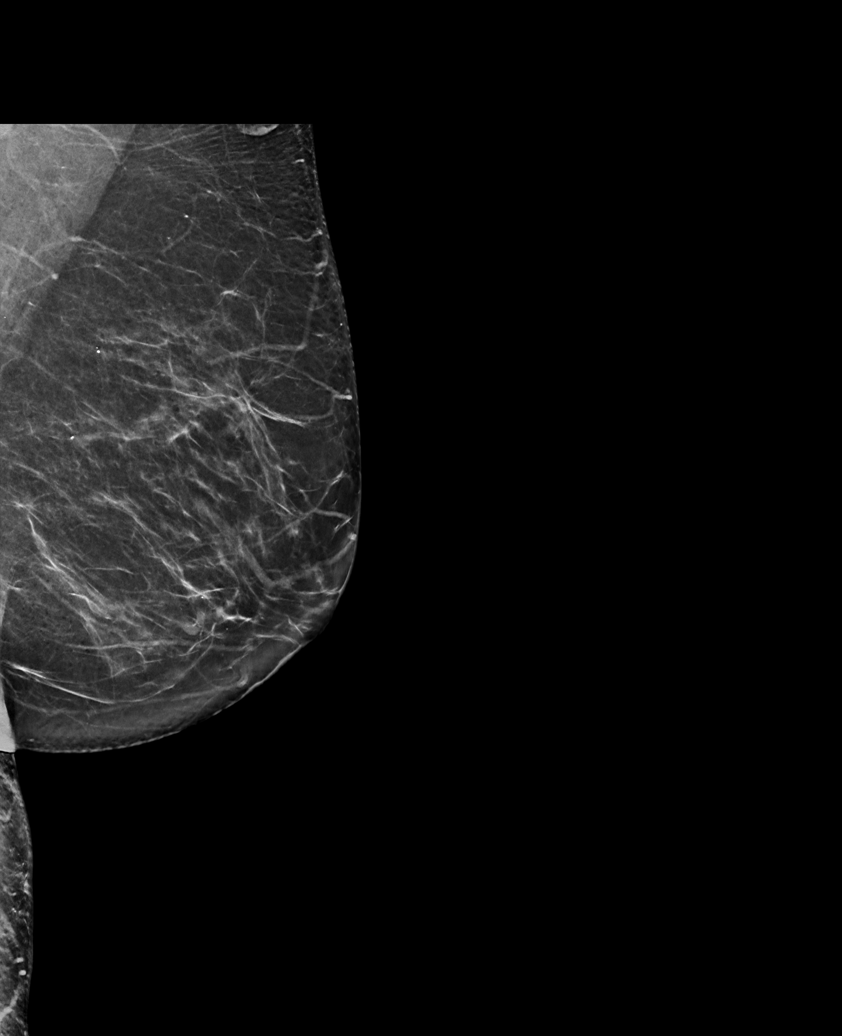

[L CC synth-2D (2 of 2)]
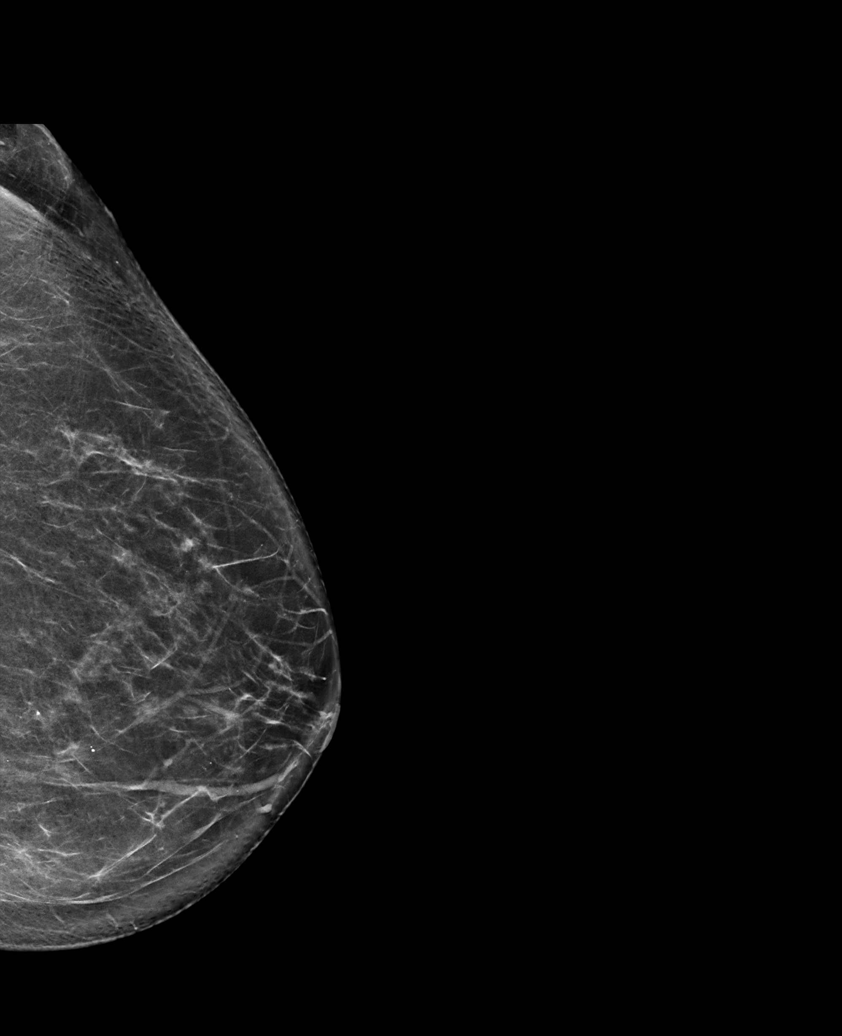

[L CC tomo (1 of 2) · tomo slice 39/77.0]
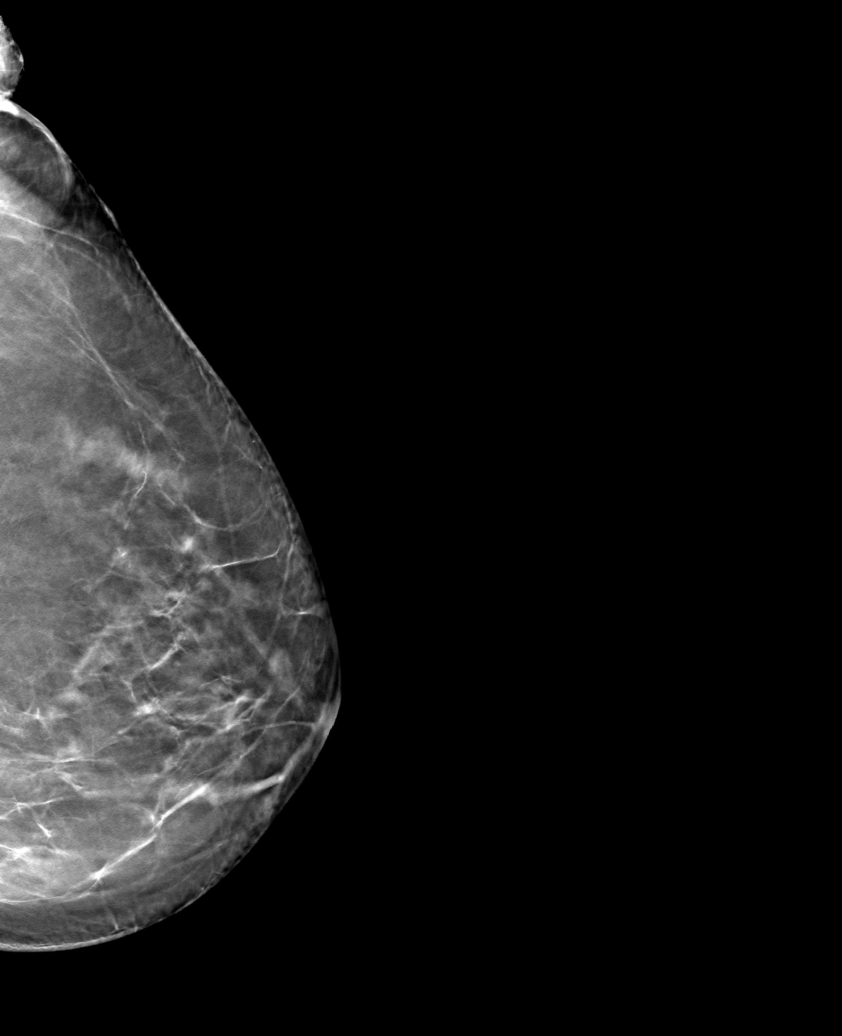

[L MLO tomo · tomo slice 39/76.0]
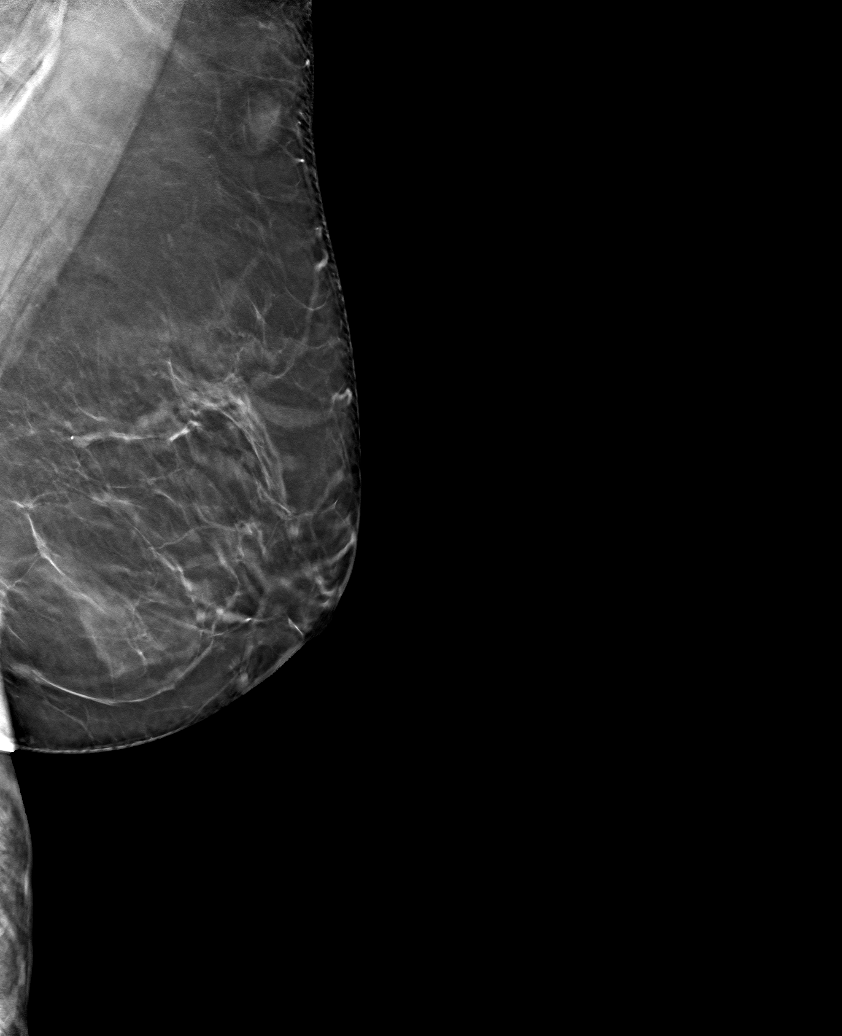

[L CC tomo (2 of 2) · tomo slice 39/77.0]
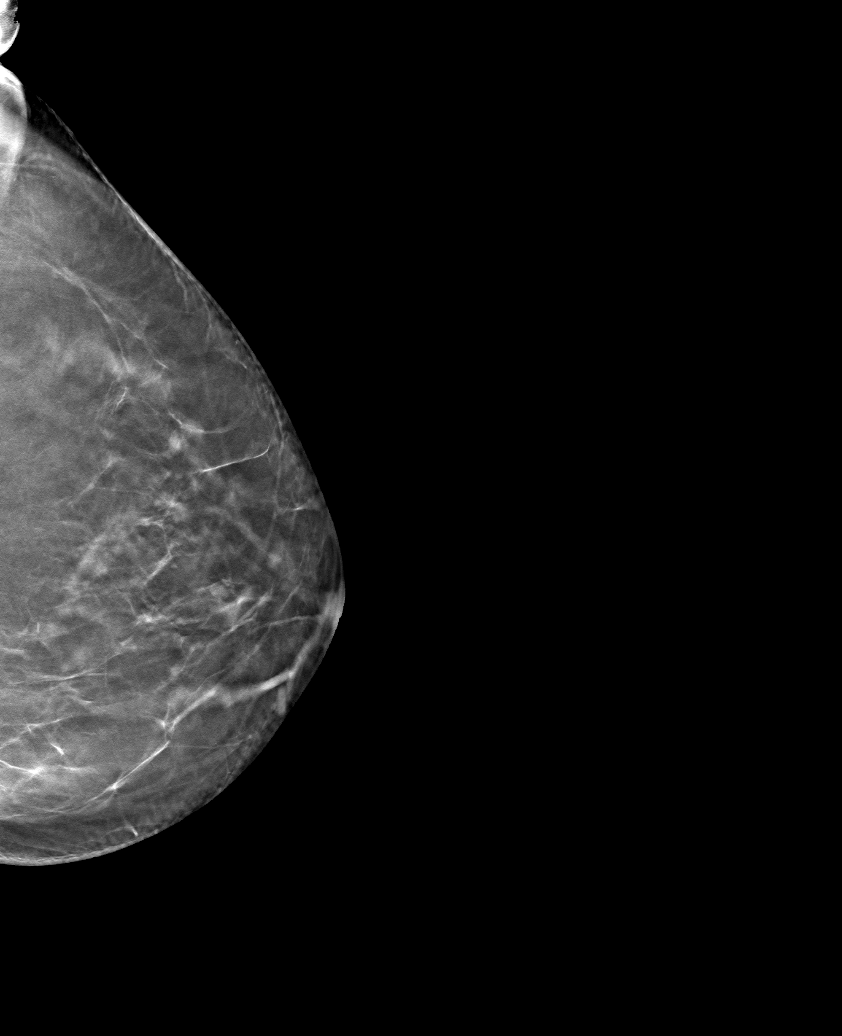

[6 of 18 positions shown; findings below may reference images not displayed]

ACR Breast Density Category b: There are scattered areas of
fibroglandular density.
FINDINGS: There are no findings suspicious for malignancy.
IMPRESSION: No mammographic evidence of malignancy. A result letter of this
screening mammogram will be mailed directly to the patient.

RECOMMENDATION:
Screening mammogram in one year. (Code:EO-6-XP6)

BI-RADS CATEGORY  1: Negative.

## 2021-10-01 ENCOUNTER — Ambulatory Visit (HOSPITAL_COMMUNITY)
Admission: RE | Admit: 2021-10-01 | Discharge: 2021-10-01 | Disposition: A | Discharge status: Home / Self Care | Payer: Medicare PPO | Source: Ambulatory Visit | Attending: Internal Medicine | Admitting: Internal Medicine

## 2021-10-01 DIAGNOSIS — R918 Other nonspecific abnormal finding of lung field: Secondary | ICD-10-CM | POA: Diagnosis not present

## 2021-10-01 DIAGNOSIS — C3431 Malignant neoplasm of lower lobe, right bronchus or lung: Secondary | ICD-10-CM | POA: Insufficient documentation

## 2021-10-01 DIAGNOSIS — C771 Secondary and unspecified malignant neoplasm of intrathoracic lymph nodes: Secondary | ICD-10-CM | POA: Diagnosis not present

## 2021-10-01 DIAGNOSIS — C3490 Malignant neoplasm of unspecified part of unspecified bronchus or lung: Secondary | ICD-10-CM | POA: Insufficient documentation

## 2021-10-01 LAB — GLUCOSE, CAPILLARY: Glucose-Capillary: 141 mg/dL — ABNORMAL HIGH (ref 70–99)

## 2021-10-01 MED ORDER — FLUDEOXYGLUCOSE F - 18 (FDG) INJECTION
7.5000 | Freq: Once | INTRAVENOUS | Status: AC
  Administered 2021-10-01: 7.71 via INTRAVENOUS

## 2021-10-07 ENCOUNTER — Ambulatory Visit: Payer: Medicare PPO | Admitting: Internal Medicine

## 2021-10-07 ENCOUNTER — Inpatient Hospital Stay: Payer: Medicare PPO | Attending: Internal Medicine | Admitting: Adult Health

## 2021-10-07 ENCOUNTER — Encounter: Payer: Self-pay | Admitting: Adult Health

## 2021-10-07 VITALS — BP 124/80 | HR 83 | Temp 97.5°F | Resp 16 | Ht 63.0 in | Wt 155.1 lb

## 2021-10-07 DIAGNOSIS — C3431 Malignant neoplasm of lower lobe, right bronchus or lung: Secondary | ICD-10-CM | POA: Diagnosis not present

## 2021-10-07 DIAGNOSIS — C3490 Malignant neoplasm of unspecified part of unspecified bronchus or lung: Secondary | ICD-10-CM

## 2021-10-07 DIAGNOSIS — C3491 Malignant neoplasm of unspecified part of right bronchus or lung: Secondary | ICD-10-CM | POA: Diagnosis not present

## 2021-10-07 DIAGNOSIS — Z87891 Personal history of nicotine dependence: Secondary | ICD-10-CM | POA: Diagnosis not present

## 2021-10-07 NOTE — Progress Notes (Signed)
Vanessa Day Follow up:    Vanessa Neer, MD [address] Vanessa Day [address]   DIAGNOSIS: Day Staging  Adenocarcinoma of lung, stage 1, right (Vanessa Day) Staging form: Lung, AJCC 8th Edition - Clinical: Stage IA3 (cT1c, cN0, cM0) - Signed by Vanessa Bears, MD on 03/23/2020   SUMMARY OF ONCOLOGIC HISTORY: Right video-assisted thoracoscopy, Wedge resection right lower lobe nodule, Thoracoscopic right lower lobectomy, Lymph node dissection on July 26, 2018 under the care of Vanessa Day  CURRENT THERAPY: Observation  INTERVAL HISTORY: Vanessa Day 81 y.o. female returns for follow-up of her history of lung Day.  She has been on observation and underwent repeat CT chest on September 15, 2021 which showed ill-defined nodular areas in the cardiophrenic recess and increasing size of lymph nodes in the mediastinum which were concerning for recurrence and PET scan was recommended.  These concerning pleural areas were concerning for malignancy with SUV max of 6.9.   Patient Active Problem List   Diagnosis Date Noted  . PVD (posterior vitreous detachment), right eye 11/13/2019  . Pseudophakia of both eyes 11/13/2019  . Dry eye syndrome 11/13/2019  . Cystoid macular edema of left eye 11/13/2019  . Adenocarcinoma of lung, stage 1, right (Vanessa Day) 08/01/2018  . History of adenomatous polyp of colon 04/11/2017  . Genetic testing 02/22/2017  . History of breast Day 02/13/2017  . Family history of breast Day   . Acute pain of right knee 07/18/2016  . Trochanteric bursitis, right hip 07/18/2016  . Trochanteric bursitis, left hip 05/04/2016  . Trigger middle finger of left hand 10/14/2015  . Neck pain 07/29/2015  . Primary osteoarthritis of first carpometacarpal joint of left hand 07/06/2015  . Osteoarthritis of finger of left hand 07/06/2015  . 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  MEDICAL HISTORY: Past Medical History:  Diagnosis Date  . Adenocarcinoma of lung, stage 1, right (Vanessa Day) 2020  . Anxiety   . Arthritis    back  . Breast Day ([place]) 2000   Right breast  . Day ([place]) 2000   rt breast Day  . Family history of breast Day   . GERD (gastroesophageal reflux disease)   . H/O hiatal hernia   . Hyperlipidemia   . Hypertension   . IBS (irritable bowel syndrome)   . Neck pain   . PONV (postoperative nausea and vomiting)   . Pre-diabetes     SURGICAL HISTORY: Past Surgical History:  Procedure Laterality Date  . ABDOMINAL HYSTERECTOMY    . APPENDECTOMY    . BACK SURGERY    . BREAST BIOPSY    . BREAST SURGERY    . CARPOMETACARPEL SUSPENSION PLASTY Left 08/25/2015   Procedure: SUSPENSIONPLASTY LEFT THUMB TRAPEZIUM EXCISION ABDUCTOR POLLICIS LONGUS TRANSFER;  Surgeon: Vanessa Brod, MD;  Location: [facility];  Service: Orthopedics;  Laterality: Left;  . CHOLECYSTECTOMY    . EYE SURGERY     cataract right and left eye  . GAS INSERTION  06/23/2011   Procedure: INSERTION OF GAS;  Surgeon: Vanessa Pedro, MD;  Location: [place] [facility];  Service: Ophthalmology;  Laterality: Left;  SF6  . LOBECTOMY Right 07/26/2018   Procedure: RIGHT LOWER LOBE LOBECTOMY;  Surgeon: Vanessa Nakayama, MD;  Location: [place] [facility];  Service: Thoracic;  Laterality: Right;  . MASTECTOMY     right  breast  . SCLERAL BUCKLE  06/23/2011   Procedure: SCLERAL BUCKLE;  Surgeon: Vanessa Pedro, MD;  Location: [facility];  Service: Ophthalmology;  Laterality: Left;  . SEGMENTECOMY Right 07/26/2018   Procedure: SEGMENTECTOMY;  Surgeon: Vanessa Nakayama, MD;  Location: [facility];  Service: Thoracic;  Laterality: Right;  . TONSILLECTOMY    . VIDEO ASSISTED THORACOSCOPY Right 07/26/2018   Procedure: VIDEO ASSISTED THORACOSCOPY;  Surgeon: Vanessa Nakayama, MD;   Location: [facility];  Service: Thoracic;  Laterality: Right;    SOCIAL HISTORY: Social History   Socioeconomic History  . Marital status: Married    Spouse name: Not on file  . Number of children: Not on file  . Years of education: Not on file  . Highest education level: Not on file  Occupational History  . Not on file  Tobacco Use  . Smoking status: Former    Packs/day: 1.00    Years: 30.00    Total pack years: 30.00    Types: Cigarettes    Quit date: 06/22/2002    Years since quitting: 19.3  . Smokeless tobacco: Never  Vaping Use  . Vaping Use: Never used  Substance and Sexual Activity  . Alcohol use: Yes    Comment: rarely  . Drug use: Yes    Types: Flunitrazepam  . Sexual activity: Not Currently  Other Topics Concern  . Not on file  Social History Narrative  . Not on file   Social Determinants of Health   Financial Resource Strain: Not on file  Food Insecurity: Not on file  Transportation Needs: Not on file  Physical Activity: Not on file  Stress: Not on file  Social Connections: Not on file  Intimate Partner Violence: Not on file    FAMILY HISTORY: Family History  Problem Relation Age of Onset  . Breast Day Mother        dx over 26  . Prostate Day Father        dx late 66s-early 70s  . COPD Father   . Cerebral palsy Brother   . Lung Day Maternal Uncle        smoker  . Stroke Maternal Grandfather   . Day Cousin        NOS  . Breast Day Daughter 50  . Anesthesia problems Neg Hx   . Hypotension Neg Hx   . Malignant hyperthermia Neg Hx   . Pseudochol deficiency Neg Hx     Review of Systems  Constitutional:  Negative for appetite change, chills, fatigue, fever and unexpected weight change.  HENT:   Negative for hearing loss, lump/mass and trouble swallowing.   Eyes:  Negative for eye problems and icterus.  Respiratory:  Negative for chest tightness, cough and shortness of breath.   Cardiovascular:  Negative for chest pain, leg  swelling and palpitations.  Gastrointestinal:  Negative for abdominal distention, abdominal pain, constipation, diarrhea, nausea and vomiting.  Endocrine: Negative for hot flashes.  Genitourinary:  Negative for difficulty urinating.   Musculoskeletal:  Negative for arthralgias.  Skin:  Negative for itching and rash.  Neurological:  Negative for dizziness, extremity weakness, headaches and numbness.  Hematological:  Negative for adenopathy. Does not bruise/bleed easily.  Psychiatric/Behavioral:  Negative for depression. The patient is nervous/anxious.       PHYSICAL EXAMINATION  ECOG PERFORMANCE STATUS: 1 - Symptomatic but completely ambulatory  Vitals:   10/07/21 1113  BP: 124/80  Pulse: 83  Resp: 16  Temp: (!) 97.5 F (36.4 C)  SpO2: 95%      LABORATORY DATA: None for this visit   ASSESSMENT and THERAPY PLAN:   No problem-specific Assessment & Plan notes found for this encounter.   No orders of the defined types were placed in this encounter.   All questions were answered. The patient knows to call the clinic with any problems, questions or concerns. We can certainly see the patient much sooner if necessary. This note was electronically signed. Scot Dock, NP 10/07/2021

## 2021-10-13 ENCOUNTER — Telehealth: Payer: Self-pay | Admitting: Internal Medicine

## 2021-10-13 DIAGNOSIS — E782 Mixed hyperlipidemia: Secondary | ICD-10-CM | POA: Diagnosis not present

## 2021-10-13 DIAGNOSIS — Z23 Encounter for immunization: Secondary | ICD-10-CM | POA: Diagnosis not present

## 2021-10-13 DIAGNOSIS — E0500 Thyrotoxicosis with diffuse goiter without thyrotoxic crisis or storm: Secondary | ICD-10-CM | POA: Diagnosis not present

## 2021-10-13 DIAGNOSIS — E1169 Type 2 diabetes mellitus with other specified complication: Secondary | ICD-10-CM | POA: Diagnosis not present

## 2021-10-13 DIAGNOSIS — I10 Essential (primary) hypertension: Secondary | ICD-10-CM | POA: Diagnosis not present

## 2021-10-13 DIAGNOSIS — I480 Paroxysmal atrial fibrillation: Secondary | ICD-10-CM | POA: Diagnosis not present

## 2021-10-13 NOTE — Telephone Encounter (Signed)
Scheduled appointment per 9/14 los. Patient is aware.

## 2021-10-21 ENCOUNTER — Other Ambulatory Visit: Payer: Self-pay | Admitting: *Deleted

## 2021-10-21 ENCOUNTER — Institutional Professional Consult (permissible substitution): Payer: Medicare PPO | Admitting: Thoracic Surgery (Cardiothoracic Vascular Surgery)

## 2021-10-21 VITALS — BP 149/81 | HR 75 | Resp 20 | Ht 63.0 in | Wt 155.0 lb

## 2021-10-21 DIAGNOSIS — C3491 Malignant neoplasm of unspecified part of right bronchus or lung: Secondary | ICD-10-CM

## 2021-10-21 NOTE — Progress Notes (Signed)
[address]       [place] [address]             [phone]     HPI: Vanessa Day sent for consultation regarding mediastinal adenopathy.  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  She had a thoracoscopic right lower lobectomy for a T1, N0, stage Ia adenocarcinoma in July 2020.  She has some issues with hoarseness and brachial plexopathy but those both resolved.  She is not receive any adjuvant therapy.  She has been followed by Dr. Julien Nordmann.  Recent CT showed right paratracheal and subcarinal adenopathy.  On PET/CT those areas were FDG avid.  There also was some FDG uptake on the chest wall and a very small chest wall nodule.  She has been feeling well except for sciatica.  She is not having any chest pain, pressure, tightness, shortness of breath, cough, wheezing, weight loss, headaches, or visual changes.  Zubrod Score: At the time of surgery this patient's most appropriate activity status/level should be described as: [x]     0    Normal activity, no symptoms []     1    Restricted in physical strenuous activity but ambulatory, able to do out light work []     2    Ambulatory and capable of self care, unable to do work activities, up and about >50 % of waking hours                              []     3    Only limited self care, in bed greater than 50% of waking hours []     4    Completely disabled, no self care, confined to bed or chair []     5    Moribund  Past Medical History:  Diagnosis Date   Adenocarcinoma of lung, stage 1, right ([place]) 2020   Anxiety    Arthritis    back   Breast cancer ([place]) 2000   Right breast   Cancer ([place]) 2000   rt breast cancer   Family history of breast cancer    GERD (gastroesophageal  reflux disease)    H/O hiatal hernia    Hyperlipidemia    Hypertension    IBS (irritable bowel syndrome)    Neck pain    PONV (postoperative nausea and vomiting)    Pre-diabetes    Past Surgical History:  Procedure Laterality Date   ABDOMINAL HYSTERECTOMY     APPENDECTOMY     BACK SURGERY     BREAST BIOPSY     BREAST SURGERY     CARPOMETACARPEL SUSPENSION PLASTY Left 08/25/2015   Procedure: SUSPENSIONPLASTY LEFT THUMB TRAPEZIUM EXCISION ABDUCTOR POLLICIS LONGUS TRANSFER;  Surgeon: Daryll Brod, MD;  Location: [facility];  Service: Orthopedics;  Laterality: Left;   CHOLECYSTECTOMY     EYE SURGERY     cataract right and left eye   GAS INSERTION  06/23/2011   Procedure: INSERTION OF GAS;  Surgeon: Hayden Pedro, MD;  Location: [facility];  Service: Ophthalmology;  Laterality: Left;  SF6   LOBECTOMY Right 07/26/2018   Procedure: RIGHT LOWER LOBE LOBECTOMY;  Surgeon: Melrose Nakayama, MD;  Location: [facility];  Service: Thoracic;  Laterality: Right;   MASTECTOMY     right breast   SCLERAL BUCKLE  06/23/2011   Procedure: SCLERAL BUCKLE;  Surgeon: Hayden Pedro, MD;  Location: [facility];  Service: Ophthalmology;  Laterality: Left;   SEGMENTECOMY Right 07/26/2018   Procedure: SEGMENTECTOMY;  Surgeon: Melrose Nakayama, MD;  Location: [facility];  Service: Thoracic;  Laterality: Right;   TONSILLECTOMY     VIDEO ASSISTED THORACOSCOPY Right 07/26/2018   Procedure: VIDEO ASSISTED THORACOSCOPY;  Surgeon: Melrose Nakayama, MD;  Location: [facility];  Service: Thoracic;  Laterality: Right;    Current Outpatient Medications  Medication Sig Dispense Refill   acetaminophen (TYLENOL) 500 MG tablet Take 1 tablet (500 mg total) by mouth every 6 (six) hours as needed for mild pain. (Patient taking differently: Take 1,000 mg by mouth every 6 (six) hours as needed for mild pain.) 30 tablet 0   alosetron (LOTRONEX) 0.5 MG tablet Take 0.5 mg by mouth as needed.     aspirin EC 81 MG tablet Take 1 tablet  (81 mg total) by mouth daily. Swallow whole. 90 tablet 3   azelastine (ASTELIN) 0.1 % nasal spray Place 1 spray into both nostrils 2 (two) times daily. Use in each nostril as directed     celecoxib (CELEBREX) 200 MG capsule Take 200 mg by mouth daily.     cetirizine (ZYRTEC) 10 MG tablet Take 10 mg by mouth daily.     ezetimibe (ZETIA) 10 MG tablet Take 10 mg by mouth daily.     hydrochlorothiazide (MICROZIDE) 12.5 MG capsule Take 1 capsule (12.5 mg total) by mouth daily. 45 capsule 3   LORazepam (ATIVAN) 1 MG tablet Take 1-2 mg by mouth at bedtime.      losartan (COZAAR) 100 MG tablet Take 100 mg by mouth daily.     methimazole (TAPAZOLE) 5 MG tablet Take 5 mg by mouth daily.     sertraline (ZOLOFT) 50 MG tablet Take 50 mg by mouth daily.     Polyethyl Glycol-Propyl Glycol (SYSTANE OP) Place 1 drop into both eyes 2 (two) times daily as needed (dry eyes).     pregabalin (LYRICA) 50 MG capsule Take 50 mg by mouth 2 (two) times daily.      No current facility-administered medications for this visit.    Physical Exam BP (!) 149/81   Pulse 75   Resp 20   Ht 5\' 3"  (1.6 m)   Wt 155 lb (70.3 kg)   SpO2 96% Comment: RA  BMI 27.46 kg/m  Well-appearing 81 year old woman in no acute distress Well-developed well-nourished Alert and oriented x3 with no focal deficits Lungs diminished at right base but otherwise clear Cardiac regular rate and rhythm No cervical or supraclavicular adenopathy No carotid bruits  Diagnostic Tests: NUCLEAR MEDICINE PET SKULL BASE TO THIGH   TECHNIQUE: 7.7 mCi F-18 FDG was injected intravenously. Full-ring PET imaging was performed from the skull base to thigh after the radiotracer. CT data was obtained and used for attenuation correction and anatomic localization.   Fasting blood glucose: 141 mg/dl   COMPARISON:  CT chest 09/14/2021   FINDINGS: Mediastinal blood pool activity: SUV max 2.75   Liver activity: SUV max NA   NECK: No hypermetabolic lymph  nodes in the neck.   Incidental CT findings: None.   CHEST: Right paratracheal lymph node is FDG avid measuring 8 mm within SUV max of 6.9, image 62/4. Mild tracer uptake within the subcarinal region has an SUV max of 4.86, with  underlying 1 cm lymph node, image 68/4.   Status post right lower lobectomy.   No right pleural effusion. As noted on the previous exam there are multiple new pleural base nodules overlying the right lung. Index nodules include:   -index subpleural nodule within the posteromedial right upper lobe measures 6 mm with SUV max of 4.05, image 26/7.   -subpleural nodule within the medial right upper lobe measures 4 mm and has an SUV max of 4.08, image 16/7.   No tracer avid lung nodules identified on the left. There is a small nodule within the periphery of the left lower lobe measuring 3 mm which is too small to characterize by PET-CT, image 44/7.   Incidental CT findings: Aortic atherosclerosis. Coronary artery calcifications identified. No pericardial effusion.   ABDOMEN/PELVIS: No abnormal uptake identified within the liver, pancreas, spleen, or adrenal glands. No tracer avid abdominopelvic lymph nodes.   Incidental CT findings: Aortic atherosclerosis. Cholecystectomy. Mild hepatic steatosis.   SKELETON: No focal hypermetabolic activity to suggest skeletal metastasis.   Incidental CT findings: None.   IMPRESSION: 1. As noted on recent CT of the chest there is progressive pleural nodularity overlying the right lung. The dominant pleural nodules referenced above are FDG avid and are concerning for malignancy. 2. Right paratracheal lymph node is FDG avid within SUV max of 6.9. Equivocal uptake within the subcarinal station with SUV max of 4.86. 3. No signs of metastatic disease to the abdomen or pelvis or osseous structures.     Electronically Signed   By: Kerby Moors M.D.   On: 10/03/2021 07:57 I personally reviewed the PET/CT images and  concur with the findings noted above.  There is adenopathy with FDG uptake in the right paratracheal and subcarinal areas.  Tiny pleural nodule with some pleural activity.  Impression: Vanessa Day is an 81 year old woman with a past history significant for tobacco abuse, lung cancer, breast cancer, hypertension, hyperlipidemia, reflux, irritable bowel syndrome, arthritis, and peripheral neuropathy.  She had a thoracoscopic right lower lobectomy for a T1, N0, stage Ia adenocarcinoma in July 2020.  She recently had a follow-up CT which showed right paratracheal and subcarinal lymph nodes enlarged from her previous scan.  On PET/CT both those areas were hypermetabolic.  Also some pleural nodularity that is hypermetabolic.  That is not accessible for biopsy.  I recommended that we proceed with bronchoscopy and endobronchial ultrasound for diagnostic purposes.  I reviewed the general nature of the procedure with Vanessa Day and her husband.  They understand this is an endoscopic procedure done under general anesthesia and that we would plan to do it on an outpatient basis.  I informed him of the indications, risks, benefits, and alternatives.  They understand the risks include, but not limited to those associated with general anesthesia, as well as bleeding and pneumothorax.  They understand there is no guarantee we will be able to get a definitive answer one way or the other.  She accepts the risks and agrees to proceed.  Plan:  Bronchoscopy and endobronchial ultrasound on Wednesday, 10/27/2021  Melrose Nakayama, MD [facility] [phone]

## 2021-10-21 NOTE — H&P (View-Only) (Signed)
[address]       [place] [address]             [phone]     HPI: [name] sent for consultation regarding mediastinal adenopathy.  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  She had a thoracoscopic right lower lobectomy for a T1, N0, stage Ia adenocarcinoma in July 2020.  She has some issues with hoarseness and brachial plexopathy but those both resolved.  She is not receive any adjuvant therapy.  She has been followed by Dr. Julien Nordmann.  Recent CT showed right paratracheal and subcarinal adenopathy.  On PET/CT those areas were FDG avid.  There also was some FDG uptake on the chest wall and a very small chest wall nodule.  She has been feeling well except for sciatica.  She is not having any chest pain, pressure, tightness, shortness of breath, cough, wheezing, weight loss, headaches, or visual changes.  Zubrod Score: At the time of surgery this patient's most appropriate activity status/level should be described as: [x]     0    Normal activity, no symptoms []     1    Restricted in physical strenuous activity but ambulatory, able to do out light work []     2    Ambulatory and capable of self care, unable to do work activities, up and about >50 % of waking hours                              []     3    Only limited self care, in bed greater than 50% of waking hours []     4    Completely disabled, no self care, confined to bed or chair []     5    Moribund  Past Medical History:  Diagnosis Date   Adenocarcinoma of lung, stage 1, right ([place]) 2020   Anxiety    Arthritis    back   Breast cancer ([place]) 2000   Right breast   Cancer ([place]) 2000   rt breast cancer   Family history of breast cancer    GERD (gastroesophageal  reflux disease)    H/O hiatal hernia    Hyperlipidemia    Hypertension    IBS (irritable bowel syndrome)    Neck pain    PONV (postoperative nausea and vomiting)    Pre-diabetes    Past Surgical History:  Procedure Laterality Date   ABDOMINAL HYSTERECTOMY     APPENDECTOMY     BACK SURGERY     BREAST BIOPSY     BREAST SURGERY     CARPOMETACARPEL SUSPENSION PLASTY Left 08/25/2015   Procedure: SUSPENSIONPLASTY LEFT THUMB TRAPEZIUM EXCISION ABDUCTOR POLLICIS LONGUS TRANSFER;  Surgeon: Daryll Brod, MD;  Location: [facility];  Service: Orthopedics;  Laterality: Left;   CHOLECYSTECTOMY     EYE SURGERY     cataract right and left eye   GAS INSERTION  06/23/2011   Procedure: INSERTION OF GAS;  Surgeon: Hayden Pedro, MD;  Location: [facility];  Service: Ophthalmology;  Laterality: Left;  SF6   LOBECTOMY Right 07/26/2018   Procedure: RIGHT LOWER LOBE LOBECTOMY;  Surgeon: Melrose Nakayama, MD;  Location: [facility];  Service: Thoracic;  Laterality: Right;   MASTECTOMY     right breast   SCLERAL BUCKLE  06/23/2011   Procedure: SCLERAL BUCKLE;  Surgeon: Hayden Pedro, MD;  Location: [facility];  Service: Ophthalmology;  Laterality: Left;   SEGMENTECOMY Right 07/26/2018   Procedure: SEGMENTECTOMY;  Surgeon: Melrose Nakayama, MD;  Location: [facility];  Service: Thoracic;  Laterality: Right;   TONSILLECTOMY     VIDEO ASSISTED THORACOSCOPY Right 07/26/2018   Procedure: VIDEO ASSISTED THORACOSCOPY;  Surgeon: Melrose Nakayama, MD;  Location: [facility] OR;  Service: Thoracic;  Laterality: Right;    Current Outpatient Medications  Medication Sig Dispense Refill   acetaminophen (TYLENOL) 500 MG tablet Take 1 tablet (500 mg total) by mouth every 6 (six) hours as needed for mild pain. (Patient taking differently: Take 1,000 mg by mouth every 6 (six) hours as needed for mild pain.) 30 tablet 0   alosetron (LOTRONEX) 0.5 MG tablet Take 0.5 mg by mouth as needed.     aspirin EC 81 MG tablet Take 1 tablet  (81 mg total) by mouth daily. Swallow whole. 90 tablet 3   azelastine (ASTELIN) 0.1 % nasal spray Place 1 spray into both nostrils 2 (two) times daily. Use in each nostril as directed     celecoxib (CELEBREX) 200 MG capsule Take 200 mg by mouth daily.     cetirizine (ZYRTEC) 10 MG tablet Take 10 mg by mouth daily.     ezetimibe (ZETIA) 10 MG tablet Take 10 mg by mouth daily.     hydrochlorothiazide (MICROZIDE) 12.5 MG capsule Take 1 capsule (12.5 mg total) by mouth daily. 45 capsule 3   LORazepam (ATIVAN) 1 MG tablet Take 1-2 mg by mouth at bedtime.      losartan (COZAAR) 100 MG tablet Take 100 mg by mouth daily.     methimazole (TAPAZOLE) 5 MG tablet Take 5 mg by mouth daily.     sertraline (ZOLOFT) 50 MG tablet Take 50 mg by mouth daily.     Polyethyl Glycol-Propyl Glycol (SYSTANE OP) Place 1 drop into both eyes 2 (two) times daily as needed (dry eyes).     pregabalin (LYRICA) 50 MG capsule Take 50 mg by mouth 2 (two) times daily.      No current facility-administered medications for this visit.    Physical Exam BP (!) 149/81   Pulse 75   Resp 20   Ht 5\' 3"  (1.6 m)   Wt 155 lb (70.3 kg)   SpO2 96% Comment: RA  BMI 27.46 kg/m  Well-appearing 81 year old woman in no acute distress Well-developed well-nourished Alert and oriented x3 with no focal deficits Lungs diminished at right base but otherwise clear Cardiac regular rate and rhythm No cervical or supraclavicular adenopathy No carotid bruits  Diagnostic Tests: NUCLEAR MEDICINE PET SKULL BASE TO THIGH   TECHNIQUE: 7.7 mCi F-18 FDG was injected intravenously. Full-ring PET imaging was performed from the skull base to thigh after the radiotracer. CT data was obtained and used for attenuation correction and anatomic localization.   Fasting blood glucose: 141 mg/dl   COMPARISON:  CT chest 09/14/2021   FINDINGS: Mediastinal blood pool activity: SUV max 2.75   Liver activity: SUV max NA   NECK: No hypermetabolic lymph  nodes in the neck.   Incidental CT findings: None.   CHEST: Right paratracheal lymph node is FDG avid measuring 8 mm within SUV max of 6.9, image 62/4. Mild tracer uptake within the subcarinal region has an SUV max of 4.86, with  underlying 1 cm lymph node, image 68/4.   Status post right lower lobectomy.   No right pleural effusion. As noted on the previous exam there are multiple new pleural base nodules overlying the right lung. Index nodules include:   -index subpleural nodule within the posteromedial right upper lobe measures 6 mm with SUV max of 4.05, image 26/7.   -subpleural nodule within the medial right upper lobe measures 4 mm and has an SUV max of 4.08, image 16/7.   No tracer avid lung nodules identified on the left. There is a small nodule within the periphery of the left lower lobe measuring 3 mm which is too small to characterize by PET-CT, image 44/7.   Incidental CT findings: Aortic atherosclerosis. Coronary artery calcifications identified. No pericardial effusion.   ABDOMEN/PELVIS: No abnormal uptake identified within the liver, pancreas, spleen, or adrenal glands. No tracer avid abdominopelvic lymph nodes.   Incidental CT findings: Aortic atherosclerosis. Cholecystectomy. Mild hepatic steatosis.   SKELETON: No focal hypermetabolic activity to suggest skeletal metastasis.   Incidental CT findings: None.   IMPRESSION: 1. As noted on recent CT of the chest there is progressive pleural nodularity overlying the right lung. The dominant pleural nodules referenced above are FDG avid and are concerning for malignancy. 2. Right paratracheal lymph node is FDG avid within SUV max of 6.9. Equivocal uptake within the subcarinal station with SUV max of 4.86. 3. No signs of metastatic disease to the abdomen or pelvis or osseous structures.     Electronically Signed   By: Kerby Moors M.D.   On: 10/03/2021 07:57 I personally reviewed the PET/CT images and  concur with the findings noted above.  There is adenopathy with FDG uptake in the right paratracheal and subcarinal areas.  Tiny pleural nodule with some pleural activity.  Impression: Vanessa Day is an 81 year old woman with a past history significant for tobacco abuse, lung cancer, breast cancer, hypertension, hyperlipidemia, reflux, irritable bowel syndrome, arthritis, and peripheral neuropathy.  She had a thoracoscopic right lower lobectomy for a T1, N0, stage Ia adenocarcinoma in July 2020.  She recently had a follow-up CT which showed right paratracheal and subcarinal lymph nodes enlarged from her previous scan.  On PET/CT both those areas were hypermetabolic.  Also some pleural nodularity that is hypermetabolic.  That is not accessible for biopsy.  I recommended that we proceed with bronchoscopy and endobronchial ultrasound for diagnostic purposes.  I reviewed the general nature of the procedure with Mrs. Dejoy and her husband.  They understand this is an endoscopic procedure done under general anesthesia and that we would plan to do it on an outpatient basis.  I informed him of the indications, risks, benefits, and alternatives.  They understand the risks include, but not limited to those associated with general anesthesia, as well as bleeding and pneumothorax.  They understand there is no guarantee we will be able to get a definitive answer one way or the other.  She accepts the risks and agrees to proceed.  Plan:  Bronchoscopy and endobronchial ultrasound on Wednesday, 10/27/2021  Melrose Nakayama, MD [facility] [phone]

## 2021-10-22 NOTE — Progress Notes (Signed)
Surgical Instructions    Your procedure is scheduled on Wednesday October 4th.  Report to [facility] Main Entrance "A" at 6:30 A.M., then check in with the Admitting office.  Call this number if you have problems the morning of surgery:  [phone]   If you have any questions prior to your surgery date call [phone]: Open Monday-Friday 8am-4pm If you experience any cold or flu symptoms such as cough, fever, chills, shortness of breath, etc. between now and your scheduled surgery, please notify us at the above number     Remember:  Do not eat or drink after midnight the night before your surgery   Take these medicines the morning of surgery with A SIP OF WATER: ezetimibe (ZETIA) 10 MG tablet methimazole (TAPAZOLE) 5 MG tablet  IF NEEDED acetaminophen (TYLENOL) 500 MG tablet azelastine (ASTELIN) 0.1 % nasal spray Polyethyl Glycol-Propyl Glycol (SYSTANE OP) sertraline (ZOLOFT) 50 MG tablet  Follow your surgeon's instructions on when to stop Aspirin.  If no instructions were given by your surgeon then you will need to call the office to get those instructions.     As of today, STOP taking any Aspirin (unless otherwise instructed by your surgeon) Celebrex, Aleve, Naproxen, Ibuprofen, Motrin, Advil, Goody's, BC's, all herbal medications, fish oil, and all vitamins.           Do not wear jewelry or makeup. Do not wear lotions, powders, perfumes or deodorant. Do not shave 48 hours prior to surgery.   Do not bring valuables to the hospital. Do not wear nail polish, gel polish, artificial nails, or any other type of covering on natural nails (fingers and toes) If you have artificial nails or gel coating that need to be removed by a nail salon, please have this removed prior to surgery. Artificial nails or gel coating may interfere with anesthesia's ability to adequately monitor your vital signs.  [facility] is not responsible for any belongings or valuables.    Do NOT Smoke  (Tobacco/Vaping)  24 hours prior to your procedure  If you use a CPAP at night, you may bring your mask for your overnight stay.   Contacts, glasses, hearing aids, dentures or partials may not be worn into surgery, please bring cases for these belongings   For patients admitted to the hospital, discharge time will be determined by your treatment team.   Patients discharged the day of surgery will not be allowed to drive home, and someone needs to stay with them for 24 hours.   SURGICAL WAITING ROOM VISITATION Patients having surgery or a procedure may have no more than 2 support people in the waiting area - these visitors may rotate.   Children under the age of 26 must have an adult with them who is not the patient. If the patient needs to stay at the hospital during part of their recovery, the visitor guidelines for inpatient rooms apply. Pre-op nurse will coordinate an appropriate time for 1 support person to accompany patient in pre-op.  This support person may not rotate.   Please refer to the [facility] website for the visitor guidelines for Inpatients (after your surgery is over and you are in a regular room).    Special instructions:    Oral Hygiene is also important to reduce your risk of infection.  Remember - BRUSH YOUR TEETH THE MORNING OF SURGERY WITH YOUR REGULAR TOOTHPASTE   [facility]- Preparing For Surgery  Before surgery, you can play an important role. Because skin is  not sterile, your skin needs to be as free of germs as possible. You can reduce the number of germs on your skin by washing with CHG (chlorahexidine gluconate) Soap before surgery.  CHG is an antiseptic cleaner which kills germs and bonds with the skin to continue killing germs even after washing.     Please do not use if you have an allergy to CHG or antibacterial soaps. If your skin becomes reddened/irritated stop using the CHG.  Do not shave (including legs and underarms) for at least 48 hours prior  to first CHG shower. It is OK to shave your face.  Please follow these instructions carefully.     Shower the NIGHT BEFORE SURGERY and the MORNING OF SURGERY with CHG Soap.   If you chose to wash your hair, wash your hair first as usual with your normal shampoo. After you shampoo, rinse your hair and body thoroughly to remove the shampoo.  Then [employer] and genitals (private parts) with your normal soap and rinse thoroughly to remove soap.  After that Use CHG Soap as you would any other liquid soap. You can apply CHG directly to the skin and wash gently with a scrungie or a clean washcloth.   Apply the CHG Soap to your body ONLY FROM THE NECK DOWN.  Do not use on open wounds or open sores. Avoid contact with your eyes, ears, mouth and genitals (private parts). Wash Face and genitals (private parts)  with your normal soap.   Wash thoroughly, paying special attention to the area where your surgery will be performed.  Thoroughly rinse your body with warm water from the neck down.  DO NOT shower/wash with your normal soap after using and rinsing off the CHG Soap.  Pat yourself dry with a CLEAN TOWEL.  Wear CLEAN PAJAMAS to bed the night before surgery  Place CLEAN SHEETS on your bed the night before your surgery  DO NOT SLEEP WITH PETS.   Day of Surgery:  Take a shower with CHG soap. Wear Clean/Comfortable clothing the morning of surgery Do not apply any deodorants/lotions.   Remember to brush your teeth WITH YOUR REGULAR TOOTHPASTE.    If you received a COVID test during your pre-op visit, it is requested that you wear a mask when out in public, stay away from anyone that may not be feeling well, and notify your surgeon if you develop symptoms. If you have been in contact with anyone that has tested positive in the last 10 days, please notify your surgeon.    Please read over the following fact sheets that you were given.

## 2021-10-25 ENCOUNTER — Encounter (HOSPITAL_COMMUNITY): Payer: Self-pay

## 2021-10-25 ENCOUNTER — Other Ambulatory Visit: Payer: Self-pay

## 2021-10-25 ENCOUNTER — Encounter (HOSPITAL_COMMUNITY)
Admission: RE | Admit: 2021-10-25 | Discharge: 2021-10-25 | Disposition: A | Discharge status: Home / Self Care | Payer: Medicare PPO | Source: Ambulatory Visit | Attending: Thoracic Surgery (Cardiothoracic Vascular Surgery) | Admitting: Thoracic Surgery (Cardiothoracic Vascular Surgery)

## 2021-10-25 ENCOUNTER — Encounter (HOSPITAL_COMMUNITY): Payer: Self-pay | Admitting: Thoracic Surgery (Cardiothoracic Vascular Surgery)

## 2021-10-25 ENCOUNTER — Ambulatory Visit (HOSPITAL_COMMUNITY)
Admission: RE | Admit: 2021-10-25 | Discharge: 2021-10-25 | Disposition: A | Discharge status: Home / Self Care | Payer: Medicare PPO | Source: Ambulatory Visit | Attending: Thoracic Surgery (Cardiothoracic Vascular Surgery) | Admitting: Thoracic Surgery (Cardiothoracic Vascular Surgery)

## 2021-10-25 VITALS — BP 124/65 | HR 65 | Temp 98.8°F | Resp 18 | Ht 63.0 in | Wt 155.7 lb

## 2021-10-25 DIAGNOSIS — I10 Essential (primary) hypertension: Secondary | ICD-10-CM | POA: Insufficient documentation

## 2021-10-25 DIAGNOSIS — E0500 Thyrotoxicosis with diffuse goiter without thyrotoxic crisis or storm: Secondary | ICD-10-CM | POA: Diagnosis not present

## 2021-10-25 DIAGNOSIS — Z79899 Other long term (current) drug therapy: Secondary | ICD-10-CM | POA: Insufficient documentation

## 2021-10-25 DIAGNOSIS — R590 Localized enlarged lymph nodes: Secondary | ICD-10-CM | POA: Insufficient documentation

## 2021-10-25 DIAGNOSIS — Z01818 Encounter for other preprocedural examination: Secondary | ICD-10-CM | POA: Diagnosis not present

## 2021-10-25 DIAGNOSIS — R7303 Prediabetes: Secondary | ICD-10-CM | POA: Diagnosis not present

## 2021-10-25 DIAGNOSIS — K589 Irritable bowel syndrome without diarrhea: Secondary | ICD-10-CM | POA: Insufficient documentation

## 2021-10-25 DIAGNOSIS — Z87891 Personal history of nicotine dependence: Secondary | ICD-10-CM | POA: Insufficient documentation

## 2021-10-25 DIAGNOSIS — C3491 Malignant neoplasm of unspecified part of right bronchus or lung: Secondary | ICD-10-CM | POA: Diagnosis not present

## 2021-10-25 DIAGNOSIS — I340 Nonrheumatic mitral (valve) insufficiency: Secondary | ICD-10-CM | POA: Diagnosis not present

## 2021-10-25 DIAGNOSIS — Z853 Personal history of malignant neoplasm of breast: Secondary | ICD-10-CM | POA: Insufficient documentation

## 2021-10-25 DIAGNOSIS — Z1152 Encounter for screening for COVID-19: Secondary | ICD-10-CM | POA: Insufficient documentation

## 2021-10-25 DIAGNOSIS — R918 Other nonspecific abnormal finding of lung field: Secondary | ICD-10-CM | POA: Diagnosis not present

## 2021-10-25 DIAGNOSIS — I480 Paroxysmal atrial fibrillation: Secondary | ICD-10-CM | POA: Insufficient documentation

## 2021-10-25 DIAGNOSIS — E785 Hyperlipidemia, unspecified: Secondary | ICD-10-CM | POA: Insufficient documentation

## 2021-10-25 DIAGNOSIS — J929 Pleural plaque without asbestos: Secondary | ICD-10-CM | POA: Diagnosis not present

## 2021-10-25 DIAGNOSIS — I700 Atherosclerosis of aorta: Secondary | ICD-10-CM | POA: Diagnosis not present

## 2021-10-25 HISTORY — DX: Thyrotoxicosis with diffuse goiter without thyrotoxic crisis or storm: E05.00

## 2021-10-25 HISTORY — DX: Nonrheumatic mitral (valve) insufficiency: I34.0

## 2021-10-25 HISTORY — DX: Paroxysmal atrial fibrillation: I48.0

## 2021-10-25 LAB — CBC
HCT: 40.8 % (ref 36.0–46.0)
Hemoglobin: 13.7 g/dL (ref 12.0–15.0)
MCH: 32.2 pg (ref 26.0–34.0)
MCHC: 33.6 g/dL (ref 30.0–36.0)
MCV: 95.8 fL (ref 80.0–100.0)
Platelets: 270 10*3/uL (ref 150–400)
RBC: 4.26 MIL/uL (ref 3.87–5.11)
RDW: 13.4 % (ref 11.5–15.5)
WBC: 8.8 10*3/uL (ref 4.0–10.5)
nRBC: 0 % (ref 0.0–0.2)

## 2021-10-25 LAB — COMPREHENSIVE METABOLIC PANEL
ALT: 16 U/L (ref 0–44)
AST: 15 U/L (ref 15–41)
Albumin: 3.9 g/dL (ref 3.5–5.0)
Alkaline Phosphatase: 64 U/L (ref 38–126)
Anion gap: 8 (ref 5–15)
BUN: 28 mg/dL — ABNORMAL HIGH (ref 8–23)
CO2: 27 mmol/L (ref 22–32)
Calcium: 8.6 mg/dL — ABNORMAL LOW (ref 8.9–10.3)
Chloride: 103 mmol/L (ref 98–111)
Creatinine, Ser: 1.52 mg/dL — ABNORMAL HIGH (ref 0.44–1.00)
GFR, Estimated: 34 mL/min — ABNORMAL LOW (ref >60)
Glucose, Bld: 130 mg/dL — ABNORMAL HIGH (ref 70–99)
Potassium: 3.9 mmol/L (ref 3.5–5.1)
Sodium: 138 mmol/L (ref 135–145)
Total Bilirubin: 1.2 mg/dL (ref 0.3–1.2)
Total Protein: 6.7 g/dL (ref 6.5–8.1)

## 2021-10-25 LAB — SARS CORONAVIRUS 2 BY RT PCR: SARS Coronavirus 2 by RT PCR: NEGATIVE

## 2021-10-25 LAB — PROTIME-INR
INR: 0.9 (ref 0.8–1.2)
Prothrombin Time: 12 seconds (ref 11.4–15.2)

## 2021-10-25 LAB — APTT: aPTT: 33 seconds (ref 24–36)

## 2021-10-25 NOTE — Progress Notes (Signed)
Error

## 2021-10-25 NOTE — Progress Notes (Signed)
PCP - Dr  Serita Grammes Cardiologist - Dr Daneen Schick Oncology - Dr Julien Nordmann  Chest x-ray - 10/25/21 EKG - 10/25/21 Stress Test - none ECHO - 08/20/20 Cardiac Cath - none Pulmonary Function Test -   ICD Pacemaker/Loop - n/a  Sleep Study -  n/a CPAP - none  Aspirin Instructions: Follow your surgeon's instructions on when to stop aspirin prior to surgery,  If no instructions were given by your surgeon then you will need to call the office for those instructions.  Anesthesia review: Yes  STOP now taking any Aspirin (unless otherwise instructed by your surgeon), Aleve, Naproxen, Ibuprofen, Motrin, Advil, Goody's, BC's, all herbal medications, fish oil, and all vitamins.   Coronavirus Screening Covid test at PAT appointment. Do you have any of the following symptoms:  Cough yes/no: No Fever (>100.38F)  yes/no: No Runny nose yes/no: No Sore throat yes/no: No Difficulty breathing/shortness of breath  yes/no: No  Have you traveled in the last 14 days and where? yes/no: No  Patient verbalized understanding of instructions that were given to them at the PAT appointment. Patient was also instructed that they will need to review over the PAT instructions again at home before surgery.

## 2021-10-26 ENCOUNTER — Encounter (HOSPITAL_COMMUNITY): Payer: Self-pay

## 2021-10-26 NOTE — Anesthesia Preprocedure Evaluation (Addendum)
Anesthesia Evaluation  Patient identified by MRN, date of birth, ID band Patient awake    Reviewed: Allergy & Precautions, NPO status , Patient's Chart, lab work & pertinent test results  History of Anesthesia Complications (+) PONV  Airway Mallampati: II  TM Distance: >3 FB Neck ROM: Full    Dental  (+) Chipped, Dental Advisory Given   Pulmonary former smoker,  adenoCa lung R   breath sounds clear to auscultation       Cardiovascular hypertension, Pt. on medications (-) angina+ dysrhythmias Atrial Fibrillation  Rhythm:Regular Rate:Normal  Echo 08/20/20: IMPRESSIONS  1. Left ventricular ejection fraction, by estimation, is 60 to 65%. The  left ventricle has normal function. The left ventricle has no regional  wall motion abnormalities. Left ventricular diastolic parameters are  indeterminate. The average left  ventricular global longitudinal strain is -18.5 %. The global longitudinal  strain is normal.  2. Right ventricular systolic function is normal. The right ventricular  size is normal. There is mildly elevated pulmonary artery systolic  pressure. The estimated right ventricular systolic pressure is 92.4 mmHg.  3. Left atrial size was mildly dilated.  4. The mitral valve is abnormal. Moderate mitral valve regurgitation. No  evidence of mitral stenosis.  5. The aortic valve is tricuspid. Aortic valve regurgitation is not  visualized. Mild to moderate aortic valve sclerosis/calcification is  present, without any evidence of aortic stenosis.  6. The inferior vena cava is normal in size with <50% respiratory  variability, suggesting right atrial pressure of 8 mmHg.    Neuro/Psych Anxiety negative neurological ROS     GI/Hepatic Neg liver ROS, GERD  Controlled,  Endo/Other  negative endocrine ROS  Renal/GU Renal InsufficiencyRenal disease     Musculoskeletal  (+) Arthritis ,   Abdominal   Peds   Hematology negative hematology ROS (+)   Anesthesia Other Findings Breast cancer  Reproductive/Obstetrics                          Anesthesia Physical Anesthesia Plan  ASA: 3  Anesthesia Plan: General   Post-op Pain Management: Tylenol PO (pre-op)*   Induction: Intravenous  PONV Risk Score and Plan: 4 or greater and Ondansetron, Dexamethasone, Treatment may vary due to age or medical condition and Propofol infusion  Airway Management Planned: Oral ETT  Additional Equipment: None  Intra-op Plan:   Post-operative Plan: Extubation in OR  Informed Consent: I have reviewed the patients History and Physical, chart, labs and discussed the procedure including the risks, benefits and alternatives for the proposed anesthesia with the patient or authorized representative who has indicated his/her understanding and acceptance.     Dental advisory given  Plan Discussed with: CRNA and Surgeon  Anesthesia Plan Comments: (PAT note written 10/26/2021 by Myra Gianotti, PA-C. )      Anesthesia Quick Evaluation

## 2021-10-26 NOTE — Progress Notes (Signed)
Anesthesia Chart Review:  Case: [id number] Date/Time: 10/27/21 0815   Procedure: VIDEO BRONCHOSCOPY WITH ENDOBRONCHIAL ULTRASOUND   Anesthesia type: General   Pre-op diagnosis: MEDIASTINAL ADENOPATHY   Location: [facility] ROOM 14 / [place] [facility]   Surgeons: Melrose Nakayama, MD       DISCUSSION: Patient is an 81 year old Day scheduled for the above procedure. She had surgery for right lung cancer in 2020. Recent surveillance CT showed some right pleural nodularity with mediastinal lymphadenopathy concerning for recurrence. Nodular region and paratracheal LN hypermetabolic on PET scan and the above procedure recommended for tissue diagnosis.  History includes former smoker (quit 06/22/02), post-operative N/V, HTN, HLD, PAF (~ 09/2020, possibly related to hyperthyroidism), mitral regurgitation (moderate MR 07/2020 echo), GERD, hiatal hernia, IBS, pre-diabetes, Graves disease (with hyperthyroidism, on Tapazole), breast cancer, lung cancer (right lung adenocarcinoma, s/p right VATS, RLL wedge resection, RL lobectomy, LN dissection 07/26/18), spinal surgery.   Dr. Roxan Hockey classified her Zubrod score as 0: Normal activity, no symptoms.  Last cardiology evaluation with Dr. Tamala Julian was on 04/05/21 for follow-up afib, possibly related to hyperthyroidism. She also has known coronary calcifications on CT imaging. She also had moderate MR on 07/2020 echo. She is now on methimazole. 10/2020 long term monitor showed < 1 % with RVR afib burden. Her wearable device has not identified afib. He felt it was safe to discontinue Eliquis now that she was on treatment for hyperthyroidism and had not shown any significant recurrence on follow-up monitoring. He recommended as needed cardiology follow-up.   She is on Tapazole for hyperthyroidism. As of 02/23/21, TSH 0.69, Free T4 1.10, ([place]). Non-fasting glucose with PAT labs was 130.  Creatinine 1.52, eGRF 34.  No reported history of CKD, although labs in [facility]  show her Creatinine was 1.40 on 09/14/21 and 1.41 on 05/10/21 with eGFR 37-38.  10/25/2021 presurgical chest x-ray is still in process.  Preoperative COVID-19 test was negative.  Anesthesia team to evaluate on the day of surgery.   VS: BP 124/65   Pulse 65   Temp 37.1 C (Oral)   Resp 18   Ht _0  (1.6 m)   Wt Vanessa.6 kg   SpO2 99%   BMI 27.58 kg/m    PROVIDERS: [name], MD is PCP Curt Bears, MD is HEM-ONC Sabino Niemann, MD is rheumatologist Madelin Rear, MD is endocrinologist   LABS: Preoperative labs noted. Cr 1.52, previously 1.40 and 1.41 when compared to 09/14/21 and 05/10/21 labs in [facility]. (all labs ordered are listed, but only abnormal results are displayed)  Labs Reviewed  COMPREHENSIVE METABOLIC PANEL - Abnormal; Notable for the following components:      Result Value   Glucose, Bld 130 (*)    BUN 28 (*)    Creatinine, Ser 1.52 (*)    Calcium 8.6 (*)    GFR, Estimated 34 (*)    All other components within normal limits  SARS CORONAVIRUS 2 BY RT PCR  CBC  PROTIME-INR  APTT  As of 02/23/21, TSH 0.69, Free T4 1.10, ([place])   IMAGES: CXR 10/25/21: In process.  PET Scan 10/03/21: IMPRESSION: 1. As noted on recent CT of the chest there is progressive pleural nodularity overlying the right lung. The dominant pleural nodules referenced above are FDG avid and are concerning for malignancy. 2. Right paratracheal lymph node is FDG avid within SUV max of 6.9. Equivocal uptake within the subcarinal station with SUV max of 4.86. 3. No signs of metastatic  disease to the abdomen or pelvis or osseous structures.   EKG: 10/25/21: Normal sinus rhythm with sinus arrhythmia Low voltage QRS Borderline ECG When compared with ECG of 23-Jul-2018 09:42, No significant change since last tracing Confirmed by Glenetta Hew [phone]) on 10/25/2021 11:38:33 PM   CV: Cardiac event monitor 10/08/20-11/06/20: NSR Paroxysmal atrial fibrillation  burden < 1 % with RVR up to 173 bpm. 10 beat WCT   Echo 08/20/20: IMPRESSIONS   1. Left ventricular ejection fraction, by estimation, is 60 to 65%. The  left ventricle has normal function. The left ventricle has no regional  wall motion abnormalities. Left ventricular diastolic parameters are  indeterminate. The average left  ventricular global longitudinal strain is -18.5 %. The global longitudinal  strain is normal.   2. Right ventricular systolic function is normal. The right ventricular  size is normal. There is mildly elevated pulmonary artery systolic  pressure. The estimated right ventricular systolic pressure is 89.2 mmHg.   3. Left atrial size was mildly dilated.   4. The mitral valve is abnormal. Moderate mitral valve regurgitation. No  evidence of mitral stenosis.   5. The aortic valve is tricuspid. Aortic valve regurgitation is not  visualized. Mild to moderate aortic valve sclerosis/calcification is  present, without any evidence of aortic stenosis.   6. The inferior vena cava is normal in size with <50% respiratory  variability, suggesting right atrial pressure of 8 mmHg.    Past Medical History:  Diagnosis Date   Adenocarcinoma of lung, stage 1, right ([place]) 2020   Anxiety    Arthritis    back, feet, jaw - per patient "all over"   Breast cancer ([place]) 2000   Right breast   Family history of breast cancer    GERD (gastroesophageal reflux disease)    Graves disease    H/O hiatal hernia    Hyperlipidemia    Hypertension    IBS (irritable bowel syndrome)    Mitral regurgitation    moderate mitral regurgitation 08/20/20 echo   Neck pain    PAF (paroxysmal atrial fibrillation) ([place])    ~ 09/2020 in setting of hyperthyroidism   PONV (postoperative nausea and vomiting)    Pre-diabetes     Past Surgical History:  Procedure Laterality Date   ABDOMINAL HYSTERECTOMY     APPENDECTOMY     BACK SURGERY     BREAST BIOPSY     BREAST SURGERY     CARPOMETACARPEL  SUSPENSION PLASTY Left 08/25/2015   Procedure: SUSPENSIONPLASTY LEFT THUMB TRAPEZIUM EXCISION ABDUCTOR POLLICIS LONGUS TRANSFER;  Surgeon: Daryll Brod, MD;  Location: [place];  Service: Orthopedics;  Laterality: Left;   CHOLECYSTECTOMY     COLONOSCOPY     several   EYE SURGERY     cataract right and left eye   GAS INSERTION  06/23/2011   Procedure: INSERTION OF GAS;  Surgeon: Hayden Pedro, MD;  Location: [place];  Service: Ophthalmology;  Laterality: Left;  SF6   LOBECTOMY Right 07/26/2018   Procedure: RIGHT LOWER LOBE LOBECTOMY;  Surgeon: Melrose Nakayama, MD;  Location: [place];  Service: Thoracic;  Laterality: Right;   MASTECTOMY     right breast   SCLERAL BUCKLE  06/23/2011   Procedure: SCLERAL BUCKLE;  Surgeon: Hayden Pedro, MD;  Location: [place];  Service: Ophthalmology;  Laterality: Left;   SEGMENTECOMY Right 07/26/2018   Procedure: SEGMENTECTOMY;  Surgeon: Melrose Nakayama, MD;  Location: [place];  Service: Thoracic;  Laterality: Right;  TONSILLECTOMY     VIDEO ASSISTED THORACOSCOPY Right 07/26/2018   Procedure: VIDEO ASSISTED THORACOSCOPY;  Surgeon: Melrose Nakayama, MD;  Location: [facility];  Service: Thoracic;  Laterality: Right;    MEDICATIONS:  acetaminophen (TYLENOL) 500 MG tablet   alosetron (LOTRONEX) 0.5 MG tablet   aspirin EC 81 MG tablet   azelastine (ASTELIN) 0.1 % nasal spray   celecoxib (CELEBREX) 200 MG capsule   cetirizine (ZYRTEC) 10 MG tablet   ezetimibe (ZETIA) 10 MG tablet   hydrochlorothiazide (MICROZIDE) 12.5 MG capsule   LORazepam (ATIVAN) 1 MG tablet   losartan (COZAAR) 100 MG tablet   methimazole (TAPAZOLE) 5 MG tablet   Polyethyl Glycol-Propyl Glycol (SYSTANE OP)   sertraline (ZOLOFT) 50 MG tablet   No current facility-administered medications for this encounter.   Myra Gianotti, PA-C Surgical Short Stay/Anesthesiology [facility] Phone [phone] [facility] Phone [phone] 10/26/2021 11:03 AM

## 2021-10-27 ENCOUNTER — Ambulatory Visit (HOSPITAL_BASED_OUTPATIENT_CLINIC_OR_DEPARTMENT_OTHER): Payer: Medicare PPO | Admitting: Anesthesiology

## 2021-10-27 ENCOUNTER — Encounter (HOSPITAL_COMMUNITY)
Admission: RE | Disposition: A | Discharge status: Home / Self Care | Payer: Self-pay | Source: Ambulatory Visit | Attending: Thoracic Surgery (Cardiothoracic Vascular Surgery)

## 2021-10-27 ENCOUNTER — Encounter (HOSPITAL_COMMUNITY): Payer: Self-pay | Admitting: Thoracic Surgery (Cardiothoracic Vascular Surgery)

## 2021-10-27 ENCOUNTER — Ambulatory Visit (HOSPITAL_COMMUNITY)
Admission: RE | Admit: 2021-10-27 | Discharge: 2021-10-27 | Disposition: A | Discharge status: Home / Self Care | Payer: Medicare PPO | Source: Ambulatory Visit | Attending: Thoracic Surgery (Cardiothoracic Vascular Surgery) | Admitting: Thoracic Surgery (Cardiothoracic Vascular Surgery)

## 2021-10-27 ENCOUNTER — Ambulatory Visit (HOSPITAL_COMMUNITY): Payer: Medicare PPO | Admitting: Vascular Surgery

## 2021-10-27 ENCOUNTER — Other Ambulatory Visit: Payer: Self-pay

## 2021-10-27 DIAGNOSIS — C771 Secondary and unspecified malignant neoplasm of intrathoracic lymph nodes: Secondary | ICD-10-CM | POA: Diagnosis not present

## 2021-10-27 DIAGNOSIS — C801 Malignant (primary) neoplasm, unspecified: Secondary | ICD-10-CM | POA: Diagnosis not present

## 2021-10-27 DIAGNOSIS — K589 Irritable bowel syndrome without diarrhea: Secondary | ICD-10-CM | POA: Diagnosis not present

## 2021-10-27 DIAGNOSIS — C3491 Malignant neoplasm of unspecified part of right bronchus or lung: Secondary | ICD-10-CM

## 2021-10-27 DIAGNOSIS — C3431 Malignant neoplasm of lower lobe, right bronchus or lung: Secondary | ICD-10-CM | POA: Insufficient documentation

## 2021-10-27 DIAGNOSIS — F419 Anxiety disorder, unspecified: Secondary | ICD-10-CM | POA: Diagnosis not present

## 2021-10-27 DIAGNOSIS — K219 Gastro-esophageal reflux disease without esophagitis: Secondary | ICD-10-CM | POA: Insufficient documentation

## 2021-10-27 DIAGNOSIS — I10 Essential (primary) hypertension: Secondary | ICD-10-CM

## 2021-10-27 DIAGNOSIS — Z902 Acquired absence of lung [part of]: Secondary | ICD-10-CM | POA: Insufficient documentation

## 2021-10-27 DIAGNOSIS — R590 Localized enlarged lymph nodes: Secondary | ICD-10-CM | POA: Diagnosis not present

## 2021-10-27 DIAGNOSIS — E785 Hyperlipidemia, unspecified: Secondary | ICD-10-CM | POA: Diagnosis not present

## 2021-10-27 DIAGNOSIS — C3490 Malignant neoplasm of unspecified part of unspecified bronchus or lung: Secondary | ICD-10-CM | POA: Diagnosis not present

## 2021-10-27 DIAGNOSIS — Z853 Personal history of malignant neoplasm of breast: Secondary | ICD-10-CM | POA: Diagnosis not present

## 2021-10-27 HISTORY — PX: VIDEO BRONCHOSCOPY WITH ENDOBRONCHIAL ULTRASOUND: SHX6177

## 2021-10-27 SURGERY — BRONCHOSCOPY, WITH EBUS
Anesthesia: General | Site: Esophagus

## 2021-10-27 MED ORDER — FENTANYL CITRATE (PF) 100 MCG/2ML IJ SOLN: 25.0000 ug | INTRAMUSCULAR | Status: DC | PRN

## 2021-10-27 MED ORDER — PHENYLEPHRINE 80 MCG/ML (10ML) SYRINGE FOR IV PUSH (FOR BLOOD PRESSURE SUPPORT)
PREFILLED_SYRINGE | INTRAVENOUS | Status: DC | PRN
  Administered 2021-10-27 (×2): 160 ug via INTRAVENOUS
  Administered 2021-10-27: 80 ug via INTRAVENOUS

## 2021-10-27 MED ORDER — PROPOFOL 10 MG/ML IV BOLUS
INTRAVENOUS | Status: DC | PRN
  Administered 2021-10-27: 40 mg via INTRAVENOUS
  Administered 2021-10-27: 80 mg via INTRAVENOUS
  Administered 2021-10-27: 20 mg via INTRAVENOUS

## 2021-10-27 MED ORDER — ROCURONIUM BROMIDE 10 MG/ML (PF) SYRINGE
PREFILLED_SYRINGE | INTRAVENOUS | Status: DC | PRN
  Administered 2021-10-27: 50 mg via INTRAVENOUS

## 2021-10-27 MED ORDER — LIDOCAINE 2% (20 MG/ML) 5 ML SYRINGE
INTRAMUSCULAR | Status: DC | PRN
  Administered 2021-10-27: 20 mg via INTRAVENOUS

## 2021-10-27 MED ORDER — EPINEPHRINE PF 1 MG/ML IJ SOLN
INTRAMUSCULAR | Status: AC
  Filled 2021-10-27: qty 1

## 2021-10-27 MED ORDER — PHENYLEPHRINE HCL-NACL 20-0.9 MG/250ML-% IV SOLN
INTRAVENOUS | Status: DC | PRN
  Administered 2021-10-27: 25 ug/min via INTRAVENOUS

## 2021-10-27 MED ORDER — PROPOFOL 500 MG/50ML IV EMUL
INTRAVENOUS | Status: DC | PRN
  Administered 2021-10-27: 50 ug/kg/min via INTRAVENOUS

## 2021-10-27 MED ORDER — ORAL CARE MOUTH RINSE: 15.0000 mL | Freq: Once | OROMUCOSAL | Status: AC

## 2021-10-27 MED ORDER — CEFAZOLIN SODIUM-DEXTROSE 2-3 GM-%(50ML) IV SOLR
INTRAVENOUS | Status: DC | PRN
  Administered 2021-10-27: 2 g via INTRAVENOUS

## 2021-10-27 MED ORDER — FENTANYL CITRATE (PF) 250 MCG/5ML IJ SOLN
INTRAMUSCULAR | Status: DC | PRN
  Administered 2021-10-27 (×2): 50 ug via INTRAVENOUS

## 2021-10-27 MED ORDER — EPINEPHRINE PF 1 MG/ML IJ SOLN
INTRAMUSCULAR | Status: DC | PRN
  Administered 2021-10-27: 1 mg via ENDOTRACHEOPULMONARY

## 2021-10-27 MED ORDER — ACETAMINOPHEN 500 MG PO TABS
1000.0000 mg | ORAL_TABLET | Freq: Once | ORAL | Status: AC
  Administered 2021-10-27: 1000 mg via ORAL
  Filled 2021-10-27: qty 2

## 2021-10-27 MED ORDER — DEXAMETHASONE SODIUM PHOSPHATE 10 MG/ML IJ SOLN
INTRAMUSCULAR | Status: DC | PRN
  Administered 2021-10-27: 4 mg via INTRAVENOUS

## 2021-10-27 MED ORDER — LACTATED RINGERS IV SOLN: INTRAVENOUS | Status: DC

## 2021-10-27 MED ORDER — SUGAMMADEX SODIUM 200 MG/2ML IV SOLN
INTRAVENOUS | Status: DC | PRN
  Administered 2021-10-27: 140 mg via INTRAVENOUS

## 2021-10-27 MED ORDER — 0.9 % SODIUM CHLORIDE (POUR BTL) OPTIME
TOPICAL | Status: DC | PRN
  Administered 2021-10-27: 1000 mL

## 2021-10-27 MED ORDER — CHLORHEXIDINE GLUCONATE 0.12 % MT SOLN
15.0000 mL | Freq: Once | OROMUCOSAL | Status: AC
  Administered 2021-10-27: 15 mL via OROMUCOSAL
  Filled 2021-10-27: qty 15

## 2021-10-27 MED ORDER — PROPOFOL 10 MG/ML IV BOLUS
INTRAVENOUS | Status: AC
  Filled 2021-10-27: qty 20

## 2021-10-27 MED ORDER — FENTANYL CITRATE (PF) 250 MCG/5ML IJ SOLN
INTRAMUSCULAR | Status: AC
  Filled 2021-10-27: qty 5

## 2021-10-27 MED ORDER — ONDANSETRON HCL 4 MG/2ML IJ SOLN
INTRAMUSCULAR | Status: DC | PRN
  Administered 2021-10-27: 4 mg via INTRAVENOUS

## 2021-10-27 SURGICAL SUPPLY — 40 items
ADAPTER VALVE BIOPSY EBUS (MISCELLANEOUS) IMPLANT
ADPTR VALVE BIOPSY EBUS (MISCELLANEOUS)
BRUSH CYTOL CELLEBRITY 1.5X140 (MISCELLANEOUS) IMPLANT
CANISTER SUCT 3000ML PPV (MISCELLANEOUS) ×1 IMPLANT
CNTNR URN SCR LID CUP LEK RST (MISCELLANEOUS) ×1 IMPLANT
CONT SPEC 4OZ STRL OR WHT (MISCELLANEOUS) ×2
COVER BACK TABLE 60X90IN (DRAPES) ×1 IMPLANT
FILTER STRAW FLUID ASPIR (MISCELLANEOUS) IMPLANT
FORCEPS BIOP RJ4 1.8 (CUTTING FORCEPS) IMPLANT
FORCEPS RADIAL JAW LRG 4 PULM (INSTRUMENTS) IMPLANT
GAUZE 4X4 16PLY ~~LOC~~+RFID DBL (SPONGE) ×1 IMPLANT
GAUZE SPONGE 4X4 12PLY STRL (GAUZE/BANDAGES/DRESSINGS) IMPLANT
GLOVE SS BIOGEL STRL SZ 7.5 (GLOVE) ×2 IMPLANT
GOWN STRL REUS W/ TWL XL LVL3 (GOWN DISPOSABLE) ×1 IMPLANT
GOWN STRL REUS W/TWL XL LVL3 (GOWN DISPOSABLE) ×1
KIT CLEAN ENDO COMPLIANCE (KITS) ×2 IMPLANT
KIT TURNOVER KIT B (KITS) ×1 IMPLANT
MARKER SKIN DUAL TIP RULER LAB (MISCELLANEOUS) ×1 IMPLANT
NDL ASPIRATION VIZISHOT 19G (NEEDLE) IMPLANT
NDL ASPIRATION VIZISHOT 21G (NEEDLE) ×1 IMPLANT
NDL BLUNT 18X1 FOR OR ONLY (NEEDLE) IMPLANT
NEEDLE ASPIRATION VIZISHOT 19G (NEEDLE) ×1 IMPLANT
NEEDLE ASPIRATION VIZISHOT 21G (NEEDLE) ×1 IMPLANT
NEEDLE BLUNT 18X1 FOR OR ONLY (NEEDLE) IMPLANT
NS IRRIG 1000ML POUR BTL (IV SOLUTION) ×1 IMPLANT
OIL SILICONE PENTAX (PARTS (SERVICE/REPAIRS)) ×1 IMPLANT
PAD ARMBOARD 7.5X6 YLW CONV (MISCELLANEOUS) ×2 IMPLANT
SYR 20ML ECCENTRIC (SYRINGE) ×2 IMPLANT
SYR 20ML LL LF (SYRINGE) ×1 IMPLANT
SYR 3ML LL SCALE MARK (SYRINGE) IMPLANT
SYR 5ML LL (SYRINGE) ×1 IMPLANT
SYR 5ML LUER SLIP (SYRINGE) ×1 IMPLANT
TOWEL GREEN STERILE (TOWEL DISPOSABLE) ×1 IMPLANT
TOWEL GREEN STERILE FF (TOWEL DISPOSABLE) ×1 IMPLANT
TRAP SPECIMEN MUCUS 40CC (MISCELLANEOUS) ×1 IMPLANT
TUBE CONNECTING 20X1/4 (TUBING) ×1 IMPLANT
VALVE BIOPSY  SINGLE USE (MISCELLANEOUS) ×4
VALVE BIOPSY SINGLE USE (MISCELLANEOUS) ×1 IMPLANT
VALVE SUCTION BRONCHIO DISP (MISCELLANEOUS) ×1 IMPLANT
WATER STERILE IRR 1000ML POUR (IV SOLUTION) ×1 IMPLANT

## 2021-10-27 NOTE — Transfer of Care (Signed)
Immediate Anesthesia Transfer of Care Note  Patient: ETHA STAMBAUGH  Procedure(s) Performed: VIDEO BRONCHOSCOPY WITH ENDOBRONCHIAL ULTRASOUND (Esophagus)  Patient Location: PACU  Anesthesia Type:General  Level of Consciousness: awake and alert   Airway & Oxygen Therapy: Patient Spontanous Breathing and Patient connected to nasal cannula oxygen  Post-op Assessment: Report given to RN and Post -op Vital signs reviewed and stable  Post vital signs: Reviewed and stable  Last Vitals:  Vitals Value Taken Time  BP 135/72   Temp    Pulse 79 10/27/21 0953  Resp 14 10/27/21 0953  SpO2 99 % 10/27/21 0953  Vitals shown include unvalidated device data.  Last Pain:  Vitals:   10/27/21 0702  TempSrc: Oral  PainSc:       Patients Stated Pain Goal: 3 (75/05/18 3358)  Complications: No notable events documented.

## 2021-10-27 NOTE — Anesthesia Postprocedure Evaluation (Signed)
Anesthesia Post Note  Patient: Vanessa Day  Procedure(s) Performed: VIDEO BRONCHOSCOPY WITH ENDOBRONCHIAL ULTRASOUND (Esophagus)     Patient location during evaluation: PACU Anesthesia Type: General Level of consciousness: awake and alert, patient cooperative and oriented Pain management: pain level controlled Vital Signs Assessment: post-procedure vital signs reviewed and stable Respiratory status: spontaneous breathing, nonlabored ventilation and respiratory function stable Cardiovascular status: blood pressure returned to baseline and stable Postop Assessment: no apparent nausea or vomiting and able to ambulate Anesthetic complications: no   No notable events documented.  Last Vitals:  Vitals:   10/27/21 1025 10/27/21 1030  BP: 121/69 119/67  Pulse: 80 71  Resp: 17 15  Temp: 36.7 C   SpO2: 95% 94%    Last Pain:  Vitals:   10/27/21 1025  TempSrc:   PainSc: 0-No pain                 Marke Goodwyn,E. Deseree Zemaitis

## 2021-10-27 NOTE — Interval H&P Note (Signed)
History and Physical Interval Note:  10/27/2021 8:31 AM  Vanessa Day  has presented today for surgery, with the diagnosis of MEDIASTINAL ADENOPATHY.  The various methods of treatment have been discussed with the patient and family. After consideration of risks, benefits and other options for treatment, the patient has consented to  Procedure(s): [place] (N/A) as a surgical intervention.  The patient's history has been reviewed, patient examined, no change in status, stable for surgery.  I have reviewed the patient's chart and labs.  Questions were answered to the patient's satisfaction.     Melrose Nakayama

## 2021-10-27 NOTE — Op Note (Signed)
NAME: [name], [name] MEDICAL RECORD NO: [id number] ACCOUNT NO: [id number] DATE OF BIRTH: August 04, 1940 FACILITY: [facility] LOCATION: [facility] PHYSICIAN: Revonda Standard. Roxan Hockey, MD  Operative Report   DATE OF PROCEDURE: 10/27/2021  PREOPERATIVE DIAGNOSIS:  Mediastinal adenopathy.  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  PROCEDURE:  Bronchoscopy and endobronchial ultrasound with mediastinal lymph node aspirations.  SURGEON:  Revonda Standard. Roxan Hockey, MD  ASSISTANT:  None.  ANESTHESIA:  General.  FINDINGS:  Quick prep on specimens from 4R node positive for non-small cell carcinoma, well healed bronchial stump.  No endobronchial lesions.  CLINICAL NOTE:  Vanessa Day is an 81 year old woman with a history of stage I adenocarcinoma of the lung.  She recently presented with a followup CT scan, which showed mediastinal adenopathy.  On PET, the adenopathy was hypermetabolic.  She was advised  to undergo bronchoscopy and endobronchial ultrasound for diagnosis and staging purposes.  The indications, risks, benefits, and alternatives were discussed in detail with the patient.  She understood and accepted the risks and agreed to proceed.  DESCRIPTION OF PROCEDURE:  [name] was brought to the operating room on 10/27/2021.  She had induction of general anesthesia and was intubated.  A timeout was performed.  Flexible fiberoptic bronchoscopy was performed via the endotracheal tube, it revealed  normal endobronchial anatomy with the exception of the well-healed right lower lobe bronchial stump.  There were no endobronchial lesions.  The bronchoscope was removed.  The endobronchial ultrasound scope was advanced.  There was a small node in the  subcarinal space. In the right paratracheal 4R location, there was a larger node.  This was the node that was markedly positive on PET/CT.  A 19-gauge needle was used to aspirate the lymph node.  All aspirations were done  with a realtime ultrasound  visualization. The needle was advanced into the node and specimens were obtained, both with and without suction applied.  The first two specimens were sent for quick prep immediately and were deemed adequate. Multiple additional aspirations were  performed with all of the additional material being placed in the cell block for definitive diagnosis and molecular testing.  There was minimal bleeding at the aspiration site. Topical dilute epinephrine solution was applied and there was no ongoing  bleeding.  The scope was removed.  The patient was extubated in the operating room and taken to the postanesthetic care unit in good condition.   NIK D: 10/27/2021 10:17:48 am T: 10/27/2021 10:39:00 am  JOB: [id number]/ [id number]

## 2021-10-27 NOTE — Discharge Instructions (Addendum)
Do not drive or engage in heavy physical activity for 24 hours  You may resume normal activities tomorrow.  You may cough up small amounts of blood over the next couple of days.  You may use an over the counter cough medication or throat lozenges if needed.  Call [phone] if you develop chest pain, shortness of breath, fever > 101 F or cough up more than a tablespoon of blood.  Follow up with Dr. Julien Nordmann as scheduled

## 2021-10-27 NOTE — Brief Op Note (Signed)
10/27/2021  10:14 AM  PATIENT:  Vanessa Day  81 y.o. female  PRE-OPERATIVE DIAGNOSIS:  MEDIASTINAL ADENOPATHY  POST-OPERATIVE DIAGNOSIS:  MEDIASTINAL ADENOPATHY  PROCEDURE:  Procedure(s): VIDEO BRONCHOSCOPY WITH ENDOBRONCHIAL ULTRASOUND (N/A)  SURGEON:  Surgeon(s) and Role:    * Melrose Nakayama, MD - Primary  PHYSICIAN ASSISTANT:   ASSISTANTS: none   ANESTHESIA:   general  EBL:  minimal  BLOOD ADMINISTERED:none  DRAINS: none   LOCAL MEDICATIONS USED:  NONE  SPECIMEN:  Source of Specimen:  4R lymph node  DISPOSITION OF SPECIMEN:  PATHOLOGY  COUNTS:  NO endoscopic  TOURNIQUET:  * No tourniquets in log *  DICTATION: .Other Dictation: Dictation Number -  PLAN OF CARE: Discharge to home after PACU  PATIENT DISPOSITION:  PACU - hemodynamically stable.   Delay start of Pharmacological VTE agent (>24hrs) due to surgical blood loss or risk of bleeding: not applicable

## 2021-10-27 NOTE — Anesthesia Procedure Notes (Signed)
Procedure Name: Intubation Date/Time: 10/27/2021 8:43 AM  Performed by: Wilburn Cornelia, CRNAPre-anesthesia Checklist: Patient identified, Emergency Drugs available, Patient being monitored, Suction available and Timeout performed Patient Re-evaluated:Patient Re-evaluated prior to induction Oxygen Delivery Method: Circle system utilized Preoxygenation: Pre-oxygenation with 100% oxygen Induction Type: IV induction Ventilation: Mask ventilation without difficulty Laryngoscope Size: Mac and 3 Grade View: Grade I Tube type: Oral Tube size: 8.5 mm Number of attempts: 1 Airway Equipment and Method: Video-laryngoscopy and Rigid stylet Placement Confirmation: positive ETCO2, CO2 detector, breath sounds checked- equal and bilateral and ETT inserted through vocal cords under direct vision Secured at: 22 cm Tube secured with: Tape Dental Injury: Teeth and Oropharynx as per pre-operative assessment  Difficulty Due To: Difficult Airway- due to anterior larynx Comments: DLx1 by H. Truman Hayward CRNA with MAC 3 - grade 3 view - esophageal intubation.  DLx1 by Dr. Glennon Mac with MAC 3 - grade 3 view - esophageal intubation.  DLx1 by Dr. Glennon Mac with glidescope - grade 1 view - atraumatic intubation. +/= BBS.  +eTCO2.  Easy mask ventilation.

## 2021-10-28 ENCOUNTER — Encounter (HOSPITAL_COMMUNITY): Payer: Self-pay | Admitting: Thoracic Surgery (Cardiothoracic Vascular Surgery)

## 2021-11-01 LAB — CYTOLOGY - NON PAP

## 2021-11-04 ENCOUNTER — Ambulatory Visit
Admission: RE | Admit: 2021-11-04 | Discharge: 2021-11-04 | Disposition: A | Discharge status: Home / Self Care | Payer: Medicare PPO | Source: Ambulatory Visit | Attending: Family Medicine | Admitting: Family Medicine

## 2021-11-04 DIAGNOSIS — Z780 Asymptomatic menopausal state: Secondary | ICD-10-CM | POA: Diagnosis not present

## 2021-11-04 DIAGNOSIS — M8589 Other specified disorders of bone density and structure, multiple sites: Secondary | ICD-10-CM | POA: Diagnosis not present

## 2021-11-04 DIAGNOSIS — M8580 Other specified disorders of bone density and structure, unspecified site: Secondary | ICD-10-CM

## 2021-11-05 ENCOUNTER — Telehealth: Payer: Self-pay | Admitting: Medical Oncology

## 2021-11-05 NOTE — Telephone Encounter (Signed)
Pt requested son and husband accompany her to appt on Monday -she is nervous.  I told wife that is fine.

## 2021-11-08 ENCOUNTER — Other Ambulatory Visit: Payer: Self-pay

## 2021-11-08 ENCOUNTER — Inpatient Hospital Stay: Payer: Medicare PPO | Attending: Internal Medicine | Admitting: Internal Medicine

## 2021-11-08 ENCOUNTER — Encounter: Payer: Self-pay | Admitting: Internal Medicine

## 2021-11-08 VITALS — BP 136/95 | HR 78 | Temp 97.9°F | Resp 15 | Wt 153.8 lb

## 2021-11-08 DIAGNOSIS — Z902 Acquired absence of lung [part of]: Secondary | ICD-10-CM | POA: Diagnosis not present

## 2021-11-08 DIAGNOSIS — C3491 Malignant neoplasm of unspecified part of right bronchus or lung: Secondary | ICD-10-CM

## 2021-11-08 DIAGNOSIS — Z7982 Long term (current) use of aspirin: Secondary | ICD-10-CM | POA: Diagnosis not present

## 2021-11-08 DIAGNOSIS — C3490 Malignant neoplasm of unspecified part of unspecified bronchus or lung: Secondary | ICD-10-CM

## 2021-11-08 DIAGNOSIS — C3431 Malignant neoplasm of lower lobe, right bronchus or lung: Secondary | ICD-10-CM | POA: Diagnosis not present

## 2021-11-08 DIAGNOSIS — Z5111 Encounter for antineoplastic chemotherapy: Secondary | ICD-10-CM

## 2021-11-08 DIAGNOSIS — Z79899 Other long term (current) drug therapy: Secondary | ICD-10-CM | POA: Insufficient documentation

## 2021-11-08 DIAGNOSIS — Z5112 Encounter for antineoplastic immunotherapy: Secondary | ICD-10-CM | POA: Insufficient documentation

## 2021-11-08 DIAGNOSIS — R059 Cough, unspecified: Secondary | ICD-10-CM | POA: Diagnosis not present

## 2021-11-08 MED ORDER — PROCHLORPERAZINE MALEATE 10 MG PO TABS: 10.0000 mg | ORAL_TABLET | Freq: Four times a day (QID) | ORAL | 0 refills | Status: DC | PRN

## 2021-11-08 NOTE — Progress Notes (Signed)
[place] Telephone:([phone]) [phone]   Fax:([phone]  OFFICE PROGRESS NOTE  Vanessa Day, Vanessa Day [address] [place] [address]  DIAGNOSIS: Recurrent non-small cell lung cancer presented as a stage IIIa (T1c, N2, M0) non-small cell lung cancer, adenocarcinoma initially diagnosed as T1a (T1c, N0, M0) non-small cell lung cancer, adenocarcinoma presented with right lower lobe lung nodule in June 2020.  PRIOR THERAPY: Right video-assisted thoracoscopy, Wedge resection right lower lobe nodule, Thoracoscopic right lower lobectomy, Lymph node dissection on July 26, 2018 under the care of Dr. Roxan Hockey  CURRENT THERAPY: Concurrent chemoradiation with weekly carboplatin for AUC of 2 and paclitaxel 45 Mg/M2.  First dose November 22, 2021.  INTERVAL HISTORY: Vanessa STALLSMITH 81 y.o. female returns to the clinic today for follow-up visit accompanied by her husband and son.  The patient is feeling fine today with no concerning complaints except for cough.  She denied having any current chest pain, shortness of breath or hemoptysis.  She has no nausea, vomiting, diarrhea or constipation.  She has no headache or visual changes.  She denied having any recent weight loss or night sweats.  She was found on recent imaging studies to have evidence for recurrent disease in the mediastinum.  The patient had bronchoscopy with EBUS under the care of Dr. 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  She is here today for evaluation and discussion of her treatment options.  MEDICAL HISTORY: Past Medical History:  Diagnosis Date   Adenocarcinoma of lung, stage 1, right ([facility]) 2020   Anxiety    Arthritis    back, feet, jaw - per patient "all over"   Breast cancer ([facility]) 2000   Right breast   Family history of breast cancer    GERD (gastroesophageal reflux disease)    Graves disease    H/O hiatal hernia     Hyperlipidemia    Hypertension    IBS (irritable bowel syndrome)    Mitral regurgitation    moderate mitral regurgitation 08/20/20 echo   Neck pain    PAF (paroxysmal atrial fibrillation) ([facility])    ~ 09/2020 in setting of hyperthyroidism   PONV (postoperative nausea and vomiting)    Pre-diabetes     ALLERGIES:  is allergic to hydrocodone, codeine, dicyclomine hcl, hyoscyamine, nitrofurantoin, statins, sudafed [pseudoephedrine], oxycodone-acetaminophen, and sulfa antibiotics.  MEDICATIONS:  Current Outpatient Medications  Medication Sig Dispense Refill   acetaminophen (TYLENOL) 500 MG tablet Take 1 tablet (500 mg total) by mouth every 6 (six) hours as needed for mild pain. (Patient taking differently: Take 1,000 mg by mouth every 6 (six) hours as needed for mild pain.) 30 tablet 0   alosetron (LOTRONEX) 0.5 MG tablet Take 0.25 mg by mouth daily as needed (Diarrhea).     aspirin EC 81 MG tablet Take 1 tablet (81 mg total) by mouth daily. Swallow whole. 90 tablet 3   azelastine (ASTELIN) 0.1 % nasal spray Place 1 spray into both nostrils daily as needed (Congestion). Use in each nostril as directed     celecoxib (CELEBREX) 200 MG capsule Take 200 mg by mouth 2 (two) times daily.     cetirizine (ZYRTEC) 10 MG tablet Take 10 mg by mouth daily as needed for allergies.     ezetimibe (ZETIA) 10 MG tablet Take 10 mg by mouth daily.     hydrochlorothiazide (MICROZIDE) 12.5 MG capsule Take 1 capsule (12.5 mg total) by  mouth daily. 45 capsule 3   LORazepam (ATIVAN) 1 MG tablet Take 1-2 mg by mouth at bedtime as needed for sleep.     losartan (COZAAR) 100 MG tablet Take 100 mg by mouth daily.     methimazole (TAPAZOLE) 5 MG tablet Take 5 mg by mouth daily.     Polyethyl Glycol-Propyl Glycol (SYSTANE OP) Place 1 drop into both eyes 2 (two) times daily as needed (dry eyes).     sertraline (ZOLOFT) 50 MG tablet Take 50 mg by mouth at bedtime.     No current facility-administered medications for this  visit.    SURGICAL HISTORY:  Past Surgical History:  Procedure Laterality Date   ABDOMINAL HYSTERECTOMY     APPENDECTOMY     BACK SURGERY     BREAST BIOPSY     BREAST SURGERY     CARPOMETACARPEL SUSPENSION PLASTY Left 08/25/2015   Procedure: SUSPENSIONPLASTY LEFT THUMB TRAPEZIUM EXCISION ABDUCTOR POLLICIS LONGUS TRANSFER;  Surgeon: Daryll Brod, Vanessa Day;  Location: [facility];  Service: Orthopedics;  Laterality: Left;   CHOLECYSTECTOMY     COLONOSCOPY     several   EYE SURGERY     cataract right and left eye   GAS INSERTION  06/23/2011   Procedure: INSERTION OF GAS;  Surgeon: Hayden Pedro, Vanessa Day;  Location: [facility];  Service: Ophthalmology;  Laterality: Left;  SF6   LOBECTOMY Right 07/26/2018   Procedure: RIGHT LOWER LOBE LOBECTOMY;  Surgeon: Melrose Nakayama, Vanessa Day;  Location: [facility];  Service: Thoracic;  Laterality: Right;   MASTECTOMY     right breast   SCLERAL BUCKLE  06/23/2011   Procedure: SCLERAL BUCKLE;  Surgeon: Hayden Pedro, Vanessa Day;  Location: [facility];  Service: Ophthalmology;  Laterality: Left;   SEGMENTECOMY Right 07/26/2018   Procedure: SEGMENTECTOMY;  Surgeon: Melrose Nakayama, Vanessa Day;  Location: [facility];  Service: Thoracic;  Laterality: Right;   TONSILLECTOMY     VIDEO ASSISTED THORACOSCOPY Right 07/26/2018   Procedure: VIDEO ASSISTED THORACOSCOPY;  Surgeon: Melrose Nakayama, Vanessa Day;  Location: [facility];  Service: Thoracic;  Laterality: Right;   VIDEO BRONCHOSCOPY WITH ENDOBRONCHIAL ULTRASOUND N/A 10/27/2021   Procedure: VIDEO BRONCHOSCOPY WITH ENDOBRONCHIAL ULTRASOUND;  Surgeon: Melrose Nakayama, Vanessa Day;  Location: [facility];  Service: Thoracic;  Laterality: N/A;    REVIEW OF SYSTEMS:  Constitutional: positive for fatigue Eyes: negative Ears, nose, mouth, throat, and face: negative Respiratory: positive for cough Cardiovascular: negative Gastrointestinal: negative Genitourinary:negative Integument/breast: negative Hematologic/lymphatic:  negative Musculoskeletal:negative Neurological: negative Behavioral/Psych: negative Endocrine: negative Allergic/Immunologic: negative   PHYSICAL EXAMINATION: General appearance: alert, cooperative, and no distress Head: Normocephalic, without obvious abnormality, atraumatic Neck: no adenopathy, no JVD, supple, symmetrical, trachea midline, and thyroid not enlarged, symmetric, no tenderness/mass/nodules Lymph nodes: Cervical, supraclavicular, and axillary nodes normal. Resp: clear to auscultation bilaterally Back: symmetric, no curvature. ROM normal. No CVA tenderness. Cardio: regular rate and rhythm, S1, S2 normal, no murmur, click, rub or gallop GI: soft, non-tender; bowel sounds normal; no masses,  no organomegaly Extremities: extremities normal, atraumatic, no cyanosis or edema Neurologic: Alert and oriented X 3, normal strength and tone. Normal symmetric reflexes. Normal coordination and gait  ECOG PERFORMANCE STATUS: 1 - Symptomatic but completely ambulatory   Blood pressure (!) 136/95, pulse 78, temperature 97.9 F (36.6 C), temperature source Oral, resp. rate 15, weight 153 lb 12.8 oz (69.8 kg), SpO2 97 %.  LABORATORY DATA: Lab Results  Component Value Date   WBC 8.8 10/25/2021   HGB 13.7 10/25/2021  HCT 40.8 10/25/2021   MCV 95.8 10/25/2021   PLT 270 10/25/2021      Chemistry      Component Value Date/Time   NA 138 10/25/2021 0908   K 3.9 10/25/2021 0908   CL 103 10/25/2021 0908   CO2 27 10/25/2021 0908   BUN 28 (H) 10/25/2021 0908   CREATININE 1.52 (H) 10/25/2021 0908   CREATININE 1.40 (H) 09/14/2021 1025      Component Value Date/Time   CALCIUM 8.6 (L) 10/25/2021 0908   ALKPHOS 64 10/25/2021 0908   AST 15 10/25/2021 0908   AST 12 (L) 09/14/2021 1025   ALT 16 10/25/2021 0908   ALT 12 09/14/2021 1025   BILITOT 1.2 10/25/2021 0908   BILITOT 1.0 09/14/2021 1025       RADIOGRAPHIC STUDIES: DG BONE DENSITY (DXA)  Result Date: 11/04/2021 EXAM: DUAL  X-RAY ABSORPTIOMETRY (DXA) FOR BONE MINERAL DENSITY IMPRESSION: Referring Physician:  Mayra Day Your patient completed a bone mineral density test using GE Lunar iDXA system (analysis version: 16). Technologist: EDH PATIENT: Name: Joshua, Zeringue Patient ID: [id number] Birth Date: Jun 17, 1940 Height: 62.0 in. Sex: Female Measured: 11/04/2021 Weight: 154.2 lbs. Indications: Advanced Age, Bilateral Ovariectomy (65.51), Breast Cancer History, Caucasian, Estrogen Deficient, History of Fracture (Adult) (V15.51), Hyperthyroid (242.9), Hysterectomy, Lung Cancer, Methimazole, Postmenopausal Fractures: Calcaneus Treatments: ASSESSMENT: The BMD measured at Forearm Radius 33% is 0.713 g/cm2 with a T-score of -1.9. This patient is considered osteopenic/low bone mass according to [facility]) criteria. The quality of the exam is good. The lumbar spine was excluded due to being excluded on prior exam. Site Region Measured Date Measured Age YA BMD Significant CHANGE T-score Left Forearm Radius 33% 11/04/2021 81.9 -1.9 0.713 g/cm2 Left Forearm Radius 33% 04/29/2019 79.4 -2.0 0.711 g/cm2 DualFemur Neck Left 11/04/2021 81.9 -1.6 0.814 g/cm2 DualFemur Neck Left 04/29/2019 79.4 -1.7 0.805 g/cm2 DualFemur Total Mean 11/04/2021 81.9 -0.3 0.967 g/cm2 * DualFemur Total Mean 04/29/2019 79.4 -0.6 0.928 g/cm2 World Health Organization [facility]) criteria for post-menopausal, Caucasian Women: Normal       T-score at or above -1 SD Osteopenia   T-score between -1 and -2.5 SD Osteoporosis T-score at or below -2.5 SD RECOMMENDATION: 1. All patients should optimize calcium and vitamin D intake. 2. Consider FDA-approved medical therapies in postmenopausal women and men aged 56 years and older, based on the following: a. A hip or vertebral (clinical or morphometric) fracture. b. T-score = -2.5 at the femoral neck or spine after appropriate evaluation to exclude secondary causes. c. Low bone mass (T-score between -1.0 and -2.5 at  the femoral neck or spine) and a 10-year probability of a hip fracture = 3% or a 10-year probability of a major osteoporosis-related fracture = 20% based on the [place]-adapted WHO algorithm. d. Clinician judgment and/or patient preferences may indicate treatment for people with 10-year fracture probabilities above or below these levels. FOLLOW-UP: Patients with diagnosis of osteoporosis or at high risk for fracture should have regular bone mineral density tests. Patients eligible for Medicare are allowed routine testing every 2 years. The testing frequency can be increased to one year for patients who have rapidly progressing disease, are receiving or discontinuing medical therapy to restore bone mass, or have additional risk factors. I have reviewed this study and agree with the findings. [facility] FRAX* 10-year Probability of Fracture Based on femoral neck BMD: DualFemur (Left) Major Osteoporotic Fracture: 20.0% Hip Fracture:  4.8% Population:                  [place] (Caucasian) Risk Factors:                History of Fracture (Adult) (V15.51) *FRAX is a [place] [employer] for Metabolic Bone Disease, a [place] (WHO) [facility]. ASSESSMENT: The probability of a major osteoporotic fracture is 20.0% within the next ten years. The probability of a hip fracture is 4.8% within the next ten years. Electronically Signed   By: Ammie Ferrier M.D.   On: 11/04/2021 08:21   DG Chest 2 View  Result Date: 10/26/2021 CLINICAL DATA:  Lung biopsy 10/27/2021.  Abnormal PET CT. EXAM: CHEST - 2 VIEW COMPARISON:  PET-CT 10/01/2021. CT chest 09/14/2021. Chest x-ray 08/18/2020, 08/07/2018. FINDINGS: Mediastinum and hilar structures normal. Heart size normal. Pleural-parenchymal thickening on the right consistent with scarring again noted. PET positive pleural nodularity previously identified on recent PET-CT best seen by PET-CT. No  acute infiltrates. No pleural effusion or pneumothorax. No acute bony abnormality. Aortic atherosclerotic vascular calcification. Surgical clips noted over the chest and abdomen. IMPRESSION: Pleural-parenchymal thickening on the right consistent with scarring again noted. PET positive pleural nodularity previously identified on recent PET-CT best seen by PET-CT. No acute infiltrates. No pneumothorax. Electronically Signed   By: Marcello Moores  Register M.D.   On: 10/26/2021 10:37     ASSESSMENT AND PLAN: This is a very pleasant 81 years old white female with stage IIIA (T1c, N2, M0) non-small cell lung cancer, adenocarcinoma initially diagnosed as diagnosed with a stage IA (T1c, N0, M0) non-small cell lung cancer, adenocarcinoma status post right lower lobectomy with lymph node dissection in July 2020.  She had disease recurrence in October 2023. The patient is currently on observation and she is feeling fine except for the dry cough. Her CT scan of the chest followed by PET scan showed increasing nodularity of the pleural surface in the right chest with ill-defined areas in the cardiophrenic recess and increasing size of lymph nodes in the mediastinum. The patient underwent bronchoscopy with EBUS under the care of Dr. 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 I had a lengthy discussion with the patient and her family today about her current disease stage, prognosis and treatment options. I recommended for the patient to complete the staging work-up by ordering MRI of the brain to rule out brain metastasis. I discussed with the patient her treatment options with a curative course of concurrent chemoradiation with weekly carboplatin for AUC of 2 and paclitaxel 45 Mg/M2 for 6-7 weeks followed by consolidation immunotherapy if she has no evidence for disease progression after the induction phase. I discussed with the patient the adverse effect of  the chemotherapy including but not limited to alopecia, myelosuppression, nausea and vomiting, peripheral neuropathy, liver or renal dysfunction. We will also [facility] her biopsy [facility] for molecular studies and PD-L1 expression. I will refer the patient to radiation oncology for evaluation and discussion of the radiotherapy option. I will arrange for the patient to have a chemotherapy education class before the first dose of her treatment. The patient will come back for follow-up visit in 2 weeks for evaluation with the start of the first cycle of her treatment. I will call her pharmacy with prescription for Compazine 10 mg p.o. every 6 hours as needed for nausea. The patient was advised to call  immediately if she has any other concerning symptoms in the interval. The patient voices understanding of current disease status and treatment options and is in agreement with the current care plan. All questions were answered. The patient knows to call the clinic with any problems, questions or concerns. We can certainly see the patient much sooner if necessary.  Disclaimer: This note was dictated with voice recognition software. Similar sounding words can inadvertently be transcribed and may not be corrected upon review.

## 2021-11-08 NOTE — Progress Notes (Signed)
START ON PATHWAY REGIMEN - Non-Small Cell Lung     A cycle is every 7 days, concurrent with RT:     Paclitaxel      Carboplatin   **Always confirm dose/schedule in your pharmacy ordering system**  Patient Characteristics: Preoperative or Nonsurgical Candidate (Clinical Staging), Stage III - Nonsurgical Candidate (Nonsquamous and Squamous), PS = 0, 1 Therapeutic Status: Preoperative or Nonsurgical Candidate (Clinical Staging) AJCC T Category: cT1c AJCC N Category: cN2 AJCC M Category: cM0 AJCC 8 Stage Grouping: IIIA ECOG Performance Status: 1 Intent of Therapy: Curative Intent, Discussed with Patient

## 2021-11-09 ENCOUNTER — Telehealth: Payer: Self-pay | Admitting: Medical Oncology

## 2021-11-09 ENCOUNTER — Other Ambulatory Visit: Payer: Self-pay

## 2021-11-09 DIAGNOSIS — M5451 Vertebrogenic low back pain: Secondary | ICD-10-CM | POA: Diagnosis not present

## 2021-11-09 NOTE — Telephone Encounter (Signed)
LVM about MRI brain . It may or may not be able to be done at the same time as her orthopedic order for MRI may or may not be able to be done on the same day. I gave her number for CS to call.

## 2021-11-11 DIAGNOSIS — M5451 Vertebrogenic low back pain: Secondary | ICD-10-CM | POA: Diagnosis not present

## 2021-11-12 DIAGNOSIS — E0590 Thyrotoxicosis, unspecified without thyrotoxic crisis or storm: Secondary | ICD-10-CM | POA: Diagnosis not present

## 2021-11-15 ENCOUNTER — Other Ambulatory Visit: Payer: Medicare PPO

## 2021-11-15 DIAGNOSIS — M5136 Other intervertebral disc degeneration, lumbar region: Secondary | ICD-10-CM | POA: Diagnosis not present

## 2021-11-15 DIAGNOSIS — M5451 Vertebrogenic low back pain: Secondary | ICD-10-CM | POA: Diagnosis not present

## 2021-11-15 DIAGNOSIS — M5416 Radiculopathy, lumbar region: Secondary | ICD-10-CM | POA: Diagnosis not present

## 2021-11-15 NOTE — Progress Notes (Signed)
Pharmacist Chemotherapy Monitoring - Initial Assessment    Anticipated start date: 11/22/21   The following has been reviewed per standard work regarding the patient's treatment regimen: The patient's diagnosis, treatment plan and drug doses, and organ/hematologic function Lab orders and baseline tests specific to treatment regimen  The treatment plan start date, drug sequencing, and pre-medications Prior authorization status  Patient's documented medication list, including drug-drug interaction screen and prescriptions for anti-emetics and supportive care specific to the treatment regimen The drug concentrations, fluid compatibility, administration routes, and timing of the medications to be used The patient's access for treatment and lifetime cumulative dose history, if applicable  The patient's medication allergies and previous infusion related reactions, if applicable   Changes made to treatment plan:  The pharmacy team has substituted IV diphenhydramine for IV cetirizine as a premedication. Patient will be monitored for hypersensitivity reaction and adverse reactions to IV cetirizine.     Follow up needed:  N/A   Kennith Center, Pharm.D., CPP 11/15/2021@2 :57 PM

## 2021-11-15 NOTE — Progress Notes (Signed)
Thoracic Location of Tumor / Histology: Stage IIIA (T1c, N2, M0) Non-small cell lung cancer- adenocarcinoma- Recurrence.  Initially diagnosed with a stage IA (T1c, N0, M0) non-small cell lung cancer, adenocarcinoma.   Patient is currently in observation of her lung cancer and was noted on recent imaging to have increasing nodularity of the pleural surface in the right chest with ill-defined areas in the cardiophrenic recess and increasing size of lymph nodes in the mediastinum.   MRI Brain 11/18/2021:  EBUS: 10/27/2021   PET 10/01/2021: Right paratracheal lymph node is FDG avid measuring 8 mm.  Mild tracer uptake within the subcarinal region, with underlying 1 cm lymph node.  CT Chest 09/14/2021: Increasingly nodular appearance of pleural surfaces in the RIGHT chest, ill-defined nodular areas in the cardiophrenic recess and increasing size of lymph nodes in the mediastinum.   Biopsies of Lymph Node 10/27/2021    Tobacco/Marijuana/Snuff/ETOH use: Former Smoker, quit 2004.   Past/Anticipated interventions by cardiothoracic surgery, if any:  Dr. Roxan Hockey -Right Lower Lobectomy with Lymph node dissection 07/26/2018.   Past/Anticipated interventions by medical oncology, if any:  Dr. Julien Nordmann 11/08/2021 -I recommended for the patient to complete the staging work-up by ordering MRI of the brain to rule out brain metastasis.  -I discussed with the patient her treatment options with a curative course of concurrent chemoradiation with weekly carboplatin for AUC of 2 and paclitaxel 45 Mg/M2 for 6-7 weeks followed by consolidation immunotherapy if she has no evidence for disease progression after the induction phase.  -We will also [facility] for molecular studies and PD-L1 expression. -I will refer the patient to radiation oncology for evaluation and discussion of the radiotherapy option.   Signs/Symptoms Weight changes, if any: No Respiratory complaints, if any: No   Hemoptysis, if any: Has occasional non-productive dry cough.  Denies hemoptysis. Pain issues, if any: No   SAFETY ISSUES: Prior radiation? No Pacemaker/ICD? No  Possible current pregnancy? Hysterectomy Is the patient on methotrexate? No  Current Complaints / other details:   Breast Cancer: Tram Flap, no chemo, no radiation

## 2021-11-16 ENCOUNTER — Ambulatory Visit
Admission: RE | Admit: 2021-11-16 | Discharge: 2021-11-16 | Disposition: A | Discharge status: Home / Self Care | Payer: Medicare PPO | Source: Ambulatory Visit | Attending: Radiation Oncology | Admitting: Radiation Oncology

## 2021-11-16 ENCOUNTER — Encounter: Payer: Self-pay | Admitting: Radiation Oncology

## 2021-11-16 ENCOUNTER — Telehealth: Payer: Self-pay

## 2021-11-16 ENCOUNTER — Telehealth: Payer: Self-pay | Admitting: Physician Assistant

## 2021-11-16 VITALS — BP 136/68 | HR 73 | Temp 97.5°F | Resp 18 | Ht 63.0 in | Wt 153.4 lb

## 2021-11-16 DIAGNOSIS — Z803 Family history of malignant neoplasm of breast: Secondary | ICD-10-CM | POA: Insufficient documentation

## 2021-11-16 DIAGNOSIS — M129 Arthropathy, unspecified: Secondary | ICD-10-CM | POA: Insufficient documentation

## 2021-11-16 DIAGNOSIS — Z79899 Other long term (current) drug therapy: Secondary | ICD-10-CM | POA: Insufficient documentation

## 2021-11-16 DIAGNOSIS — E785 Hyperlipidemia, unspecified: Secondary | ICD-10-CM | POA: Diagnosis not present

## 2021-11-16 DIAGNOSIS — C3431 Malignant neoplasm of lower lobe, right bronchus or lung: Secondary | ICD-10-CM | POA: Insufficient documentation

## 2021-11-16 DIAGNOSIS — K589 Irritable bowel syndrome without diarrhea: Secondary | ICD-10-CM | POA: Diagnosis not present

## 2021-11-16 DIAGNOSIS — C3491 Malignant neoplasm of unspecified part of right bronchus or lung: Secondary | ICD-10-CM

## 2021-11-16 DIAGNOSIS — Z791 Long term (current) use of non-steroidal anti-inflammatories (NSAID): Secondary | ICD-10-CM | POA: Diagnosis not present

## 2021-11-16 DIAGNOSIS — M5430 Sciatica, unspecified side: Secondary | ICD-10-CM | POA: Insufficient documentation

## 2021-11-16 DIAGNOSIS — K219 Gastro-esophageal reflux disease without esophagitis: Secondary | ICD-10-CM | POA: Diagnosis not present

## 2021-11-16 DIAGNOSIS — Z9011 Acquired absence of right breast and nipple: Secondary | ICD-10-CM | POA: Diagnosis not present

## 2021-11-16 DIAGNOSIS — Z853 Personal history of malignant neoplasm of breast: Secondary | ICD-10-CM | POA: Diagnosis not present

## 2021-11-16 DIAGNOSIS — I10 Essential (primary) hypertension: Secondary | ICD-10-CM | POA: Diagnosis not present

## 2021-11-16 DIAGNOSIS — I480 Paroxysmal atrial fibrillation: Secondary | ICD-10-CM | POA: Insufficient documentation

## 2021-11-16 DIAGNOSIS — Z7982 Long term (current) use of aspirin: Secondary | ICD-10-CM | POA: Diagnosis not present

## 2021-11-16 DIAGNOSIS — Z87891 Personal history of nicotine dependence: Secondary | ICD-10-CM | POA: Insufficient documentation

## 2021-11-16 NOTE — Telephone Encounter (Signed)
This nurse reached out to patient and made aware that provider has no concerns about her receiving an injection in her back for her sciatic nerve pain.  Patient acknowledged understanding.  No further concerns at this time.

## 2021-11-16 NOTE — Telephone Encounter (Signed)
Scheduled per 10/16 los, patient has been called and voicemail was left.

## 2021-11-16 NOTE — Progress Notes (Signed)
Radiation Oncology         [phone] ________________________________  Name: Vanessa Day        MRN: [id number]  Date of Service: 11/16/2021 DOB: 08-Jun-1940  UQ:JFHL, Nathen May, MD  Curt Bears, MD     REFERRING PHYSICIAN: Curt Bears, MD   DIAGNOSIS: The primary encounter diagnosis was Adenocarcinoma of lung, stage 1, right ([place]). A diagnosis of Adenocarcinoma of right lung, stage 3 ([facility]) was also pertinent to this visit.   HISTORY OF PRESENT ILLNESS: Vanessa Day is a 81 y.o. female seen at the request of Dr. Julien Nordmann for diagnosis of recurrent progressive Stage IA3, pT1cN0M0, NSCLC, adenocarcinoma of RLL  which was treated with wedge resection right lower lobectomy and lymph node dissection on 07/26/2018.  The patient has been followed in surveillance and has been without evidence of disease until imaging in August 2023 showed an increasing nodular appearance of pleural surfaces in the right chest as well as increasing size of lymph nodes in the mediastinum.  She underwent a PET scan on 10/01/2021 which showed hypermetabolic activity in the right paratracheal lymph node, subcarinal lymph node, and hypermetabolism in subpleural nodule in the posterior medial right lower lobe, and medial right upper lobe.  Given these findings she did undergo bronchoscopy on 10/27/2021 and her 4R lymph node was sampled and consistent with metastatic adenocarcinoma.  She has met with Dr. Julien Nordmann who is recommended chemoradiation and is seen today to discuss radiotherapy.  She is scheduled to begin her chemotherapy on 11/22/2021.  She is also scheduled for MRI brain on 11/18/2021.  Of note the patient reports a history of right breast cancer treated with lumpectomy followed by mastectomy due to positive margins.  She believes that her lymph nodes were clear at that time and did not receive any adjuvant systemic or radiation therapy.  She does not recall recommendations for antihormone therapy either.   She is seen today to discuss chemoradiation however for her lung cancer    PREVIOUS RADIATION THERAPY: No   PAST MEDICAL HISTORY:  Past Medical History:  Diagnosis Date   Adenocarcinoma of lung, stage 1, right ([place]) 2020   Anxiety    Arthritis    back, feet, jaw - per patient "all over"   Breast cancer ([place]) 2000   Right breast   Family history of breast cancer    GERD (gastroesophageal reflux disease)    Graves disease    H/O hiatal hernia    Hyperlipidemia    Hypertension    IBS (irritable bowel syndrome)    Mitral regurgitation    moderate mitral regurgitation 08/20/20 echo   Neck pain    PAF (paroxysmal atrial fibrillation) ([place])    ~ 09/2020 in setting of hyperthyroidism   PONV (postoperative nausea and vomiting)    Pre-diabetes        PAST SURGICAL HISTORY: Past Surgical History:  Procedure Laterality Date   ABDOMINAL HYSTERECTOMY     APPENDECTOMY     BACK SURGERY     BREAST BIOPSY     BREAST SURGERY     CARPOMETACARPEL SUSPENSION PLASTY Left 08/25/2015   Procedure: SUSPENSIONPLASTY LEFT THUMB TRAPEZIUM EXCISION ABDUCTOR POLLICIS LONGUS TRANSFER;  Surgeon: Daryll Brod, MD;  Location: [facility];  Service: Orthopedics;  Laterality: Left;   CHOLECYSTECTOMY     COLONOSCOPY     several   EYE SURGERY     cataract right and left eye   GAS INSERTION  06/23/2011   Procedure: INSERTION  OF GAS;  Surgeon: Hayden Pedro, MD;  Location: [facility];  Service: Ophthalmology;  Laterality: Left;  SF6   LOBECTOMY Right 07/26/2018   Procedure: RIGHT LOWER LOBE LOBECTOMY;  Surgeon: Melrose Nakayama, MD;  Location: [facility];  Service: Thoracic;  Laterality: Right;   MASTECTOMY     right breast   SCLERAL BUCKLE  06/23/2011   Procedure: SCLERAL BUCKLE;  Surgeon: Hayden Pedro, MD;  Location: [facility];  Service: Ophthalmology;  Laterality: Left;   SEGMENTECOMY Right 07/26/2018   Procedure: SEGMENTECTOMY;  Surgeon: Melrose Nakayama, MD;  Location: [facility];  Service:  Thoracic;  Laterality: Right;   TONSILLECTOMY     VIDEO ASSISTED THORACOSCOPY Right 07/26/2018   Procedure: VIDEO ASSISTED THORACOSCOPY;  Surgeon: Melrose Nakayama, MD;  Location: [facility];  Service: Thoracic;  Laterality: Right;   VIDEO BRONCHOSCOPY WITH ENDOBRONCHIAL ULTRASOUND N/A 10/27/2021   Procedure: VIDEO BRONCHOSCOPY WITH ENDOBRONCHIAL ULTRASOUND;  Surgeon: Melrose Nakayama, MD;  [facility];  Service: Thoracic;  Laterality: N/A;     FAMILY HISTORY:  Family History  Problem Relation Age of Onset   Breast cancer Mother        dx over 47   Prostate cancer Father        dx late 68s-early 70s   COPD Father    Cerebral palsy Brother    Lung cancer Maternal Uncle        smoker   Stroke Maternal Grandfather    Cancer Cousin        NOS   Breast cancer Daughter 77   Anesthesia problems Neg Hx    Hypotension Neg Hx    Malignant hyperthermia Neg Hx    Pseudochol deficiency Neg Hx      SOCIAL HISTORY:  reports that she quit smoking about 19 years ago. Her smoking use included cigarettes. She has a 30.00 pack-year smoking history. She has never used smokeless tobacco. She reports current alcohol use of about 7.0 standard drinks of alcohol per week. She reports current drug use. Drug: Flunitrazepam.  The patient is married and resides in [place].  She is accompanied by her husband.   ALLERGIES: Hydrocodone, Codeine, Dicyclomine hcl, Hyoscyamine, Nitrofurantoin, Statins, Sudafed [pseudoephedrine], Oxycodone-acetaminophen, and Sulfa antibiotics   MEDICATIONS:  Current Outpatient Medications  Medication Sig Dispense Refill   acetaminophen (TYLENOL) 500 MG tablet Take 1 tablet (500 mg total) by mouth every 6 (six) hours as needed for mild pain. (Patient taking differently: Take 1,000 mg by mouth every 6 (six) hours as needed for mild pain.) 30 tablet 0   alosetron (LOTRONEX) 0.5 MG tablet Take 0.25 mg by mouth daily as needed (Diarrhea).     AMBULATORY NON FORMULARY  MEDICATION Take 4 capsules by mouth daily. Medication Name: SciatiEase- Sciatic Nerve Formula.     aspirin EC 81 MG tablet Take 1 tablet (81 mg total) by mouth daily. Swallow whole. 90 tablet 3   azelastine (ASTELIN) 0.1 % nasal spray Place 1 spray into both nostrils daily as needed (Congestion). Use in each nostril as directed     celecoxib (CELEBREX) 200 MG capsule Take 200 mg by mouth 2 (two) times daily.     cetirizine (ZYRTEC) 10 MG tablet Take 10 mg by mouth daily as needed for allergies.     ezetimibe (ZETIA) 10 MG tablet Take 10 mg by mouth daily.     hydrochlorothiazide (MICROZIDE) 12.5 MG capsule Take 1 capsule (12.5 mg total) by mouth daily. 45 capsule 3  LORazepam (ATIVAN) 1 MG tablet Take 1-2 mg by mouth at bedtime as needed for sleep.     losartan (COZAAR) 100 MG tablet Take 100 mg by mouth daily.     methimazole (TAPAZOLE) 5 MG tablet Take 5 mg by mouth daily.     Polyethyl Glycol-Propyl Glycol (SYSTANE OP) Place 1 drop into both eyes 2 (two) times daily as needed (dry eyes).     prochlorperazine (COMPAZINE) 10 MG tablet Take 1 tablet (10 mg total) by mouth every 6 (six) hours as needed for nausea or vomiting. 30 tablet 0   sertraline (ZOLOFT) 50 MG tablet Take 50 mg by mouth at bedtime.     No current facility-administered medications for this encounter.     REVIEW OF SYSTEMS: On review of systems, the patient reports that she is doing well overall.  She denies any hemoptysis following her bronchoscopy but states that she did have a sore throat after the procedure for a few days.  She reports normal breathing at this time and denies any difficulties with her breathing since her original lobectomy.  She states that she has not had any unintended weight changes fevers or chills.  She does report sciatic back pain resulting in pain down and into her left hip, and is due for an injection with her orthopedist.  She is hoping to have this performed soon.  No other complaints are  verbalized.    PHYSICAL EXAM:  Wt Readings from Last 3 Encounters:  11/16/21 153 lb 6.4 oz (69.6 kg)  11/08/21 153 lb 12.8 oz (69.8 kg)  10/27/21 155 lb 10.3 oz (70.6 kg)   Temp Readings from Last 3 Encounters:  11/16/21 (!) 97.5 F (36.4 C)  11/08/21 97.9 F (36.6 C) (Oral)  10/27/21 98.1 F (36.7 C)   BP Readings from Last 3 Encounters:  11/16/21 136/68  11/08/21 (!) 136/95  10/27/21 119/67   Pulse Readings from Last 3 Encounters:  11/16/21 73  11/08/21 78  10/27/21 71   Pain Assessment Pain Score: 8  Pain Loc: Hip (Left Hip)/10  In general this is a well appearing Caucasian female in no acute distress.  She's alert and oriented x4 and appropriate throughout the examination. Cardiopulmonary assessment is negative for acute distress and she exhibits normal effort.     ECOG = 1  0 - Asymptomatic (Fully active, able to carry on all predisease activities without restriction)  1 - Symptomatic but completely ambulatory (Restricted in physically strenuous activity but ambulatory and able to carry out work of a light or sedentary nature. For example, light housework, office work)  2 - Symptomatic, <50% in bed during the day (Ambulatory and capable of all self care but unable to carry out any work activities. Up and about more than 50% of waking hours)  3 - Symptomatic, >50% in bed, but not bedbound (Capable of only limited self-care, confined to bed or chair 50% or more of waking hours)  4 - Bedbound (Completely disabled. Cannot carry on any self-care. Totally confined to bed or chair)  5 - Death   Eustace Pen MM, Creech RH, Tormey DC, et al. [phone]). "Toxicity and response criteria of the [facility]". [place] Oncol. 5 (6): 649-55    LABORATORY DATA:  Lab Results  Component Value Date   WBC 8.8 10/25/2021   HGB 13.7 10/25/2021   HCT 40.8 10/25/2021   MCV 95.8 10/25/2021   PLT 270 10/25/2021   Lab Results  Component Value Date  NA 138  10/25/2021   K 3.9 10/25/2021   CL 103 10/25/2021   CO2 27 10/25/2021   Lab Results  Component Value Date   ALT 16 10/25/2021   AST 15 10/25/2021   ALKPHOS 64 10/25/2021   BILITOT 1.2 10/25/2021      RADIOGRAPHY: DG BONE DENSITY (DXA)  Result Date: 11/04/2021 EXAM: DUAL X-RAY ABSORPTIOMETRY (DXA) FOR BONE MINERAL DENSITY IMPRESSION: Referring Physician:  [name] Your patient completed a bone mineral density test using GE Lunar iDXA system (analysis version: 16). Technologist: EDH PATIENT: Name: Kaisyn, Reinhold Patient ID: [id number] Birth Date: 1940/09/08 Height: 62.0 in. Sex: Female Measured: 11/04/2021 Weight: 154.2 lbs. Indications: Advanced Age, Bilateral Ovariectomy (65.51), Breast Cancer History, Caucasian, Estrogen Deficient, History of Fracture (Adult) (V15.51), Hyperthyroid (242.9), Hysterectomy, Lung Cancer, Methimazole, Postmenopausal Fractures: Calcaneus Treatments: ASSESSMENT: The BMD measured at Forearm Radius 33% is 0.713 g/cm2 with a T-score of -1.9. This patient is considered osteopenic/low bone mass according to [facility]) criteria. The quality of the exam is good. The lumbar spine was excluded due to being excluded on prior exam. Site Region Measured Date Measured Age YA BMD Significant CHANGE T-score Left Forearm Radius 33% 11/04/2021 80.9 -1.9 0.713 g/cm2 Left Forearm Radius 33% 3/29/2019 79.4 -2.0 0.711 g/cm2 DualFemur Neck Left 11/04/2021 81.9 -1.6 0.814 g/cm2 DualFemur Neck Left 04/29/2019 79.4 -1.7 0.805 g/cm2 DualFemur Total Mean 11/04/2021 81.9 -0.3 0.967 g/cm2 * DualFemur Total Mean 04/29/2019 79.4 -0.6 0.928 g/cm2 World Health Organization [facility]) criteria for post-menopausal, Caucasian Women: Normal       T-score at or above -1 SD Osteopenia   T-score between -1 and -2.5 SD Osteoporosis T-score at or below -2.5 SD RECOMMENDATION: 1. All patients should optimize calcium and vitamin D intake. 2. Consider FDA-approved medical therapies in  postmenopausal women and men aged 43 years and older, based on the following: a. A hip or vertebral (clinical or morphometric) fracture. b. T-score = -2.5 at the femoral neck or spine after appropriate evaluation to exclude secondary causes. c. Low bone mass (T-score between -1.0 and -2.5 at the femoral neck or spine) and a 10-year probability of a hip fracture = 3% or a 10-year probability of a major osteoporosis-related fracture = 20% based on the [place]-adapted WHO algorithm. d. Clinician judgment and/or patient preferences may indicate treatment for people with 10-year fracture probabilities above or below these levels. FOLLOW-UP: Patients with diagnosis of osteoporosis or at high risk for fracture should have regular bone mineral density tests. Patients eligible for Medicare are allowed routine testing every 2 years. The testing frequency can be increased to one year for patients who have rapidly progressing disease, are receiving or discontinuing medical therapy to restore bone mass, or have additional risk factors. I have reviewed this study and agree with the findings. [facility] FRAX* 10-year Probability of Fracture Based on femoral neck BMD: DualFemur (Left) Major Osteoporotic Fracture: 20.0% Hip Fracture:                4.8% Population:                  [place] (Caucasian) Risk Factors:                History of Fracture (Adult) (V15.51) *FRAX is a Materials engineer of the [employer] for Metabolic Bone Disease, a [place] (WHO) [facility]. ASSESSMENT: The probability of a major osteoporotic fracture is 20.0% within the next ten years. The probability  of a hip fracture is 4.8% within the next ten years. Electronically Signed   By: Ammie Ferrier M.D.   On: 11/04/2021 08:21   DG Chest 2 View  Result Date: 10/26/2021 CLINICAL DATA:  Lung biopsy 10/27/2021.  Abnormal PET CT. EXAM: CHEST - 2 VIEW COMPARISON:  PET-CT 10/01/2021. CT chest  09/14/2021. Chest x-ray 08/18/2020, 08/07/2018. FINDINGS: Mediastinum and hilar structures normal. Heart size normal. Pleural-parenchymal thickening on the right consistent with scarring again noted. PET positive pleural nodularity previously identified on recent PET-CT best seen by PET-CT. No acute infiltrates. No pleural effusion or pneumothorax. No acute bony abnormality. Aortic atherosclerotic vascular calcification. Surgical clips noted over the chest and abdomen. IMPRESSION: Pleural-parenchymal thickening on the right consistent with scarring again noted. PET positive pleural nodularity previously identified on recent PET-CT best seen by PET-CT. No acute infiltrates. No pneumothorax. Electronically Signed   By: Marcello Moores  Register M.D.   On: 10/26/2021 10:37       IMPRESSION/PLAN: 1. Recurrent Progressive Stage IA3, pT1cN0M0, NSCLC, adenocarcinoma of RLL. Dr. Lisbeth Renshaw discusses the pathology findings and reviews the nature of recurrent lung disease involving the lymph nodes and recommends a course of chemoradiation.  Dr. Lisbeth Renshaw discusses the rationale for treating the findings in the right lung and regional lymph nodes.  We discussed the risks, benefits, short, and long term effects of radiotherapy, as well as the curative intent, and the patient is interested in proceeding. Dr. Lisbeth Renshaw discusses the delivery and logistics of radiotherapy and anticipates a course of 6 1/2 weeks of radiotherapy to the chest. Written consent is obtained and placed in the chart, a copy was provided to the patient. She will simulate tomorrow. 2. Sciatica.  The patient will follow-up with her orthopedic providers and is aware that she would need to discuss blood count levels with Dr. Julien Nordmann prior to having this performed.  In a visit lasting 60 minutes, greater than 50% of the time was spent face to face discussing the patient's condition, in preparation for the discussion, and coordinating the patient's care.   The above  documentation reflects my direct findings during this shared patient visit. Please see the separate note by Dr. Lisbeth Renshaw on this date for the remainder of the patient's plan of care.    Carola Rhine, [facility]   **Disclaimer: This note was dictated with voice recognition software. Similar sounding words can inadvertently be transcribed and this note may contain transcription errors which may not have been corrected upon publication of note.**

## 2021-11-16 NOTE — Telephone Encounter (Signed)
This nurse received a message from this patient that stated that she is getting a shot in her back on Thursday 10/26 for her sciatic nerve pain.  She states that she was advised by the ortho doctor to let this provider know. She said she was advised that it should not cause any problems with her treatment.  No further questions or concerns noted at this time.

## 2021-11-17 ENCOUNTER — Other Ambulatory Visit: Payer: Self-pay

## 2021-11-17 ENCOUNTER — Inpatient Hospital Stay: Payer: Medicare PPO

## 2021-11-17 ENCOUNTER — Ambulatory Visit
Admission: RE | Admit: 2021-11-17 | Discharge: 2021-11-17 | Disposition: A | Discharge status: Home / Self Care | Payer: Medicare PPO | Source: Ambulatory Visit | Attending: Radiation Oncology | Admitting: Radiation Oncology

## 2021-11-17 DIAGNOSIS — Z510 Encounter for antineoplastic radiation therapy: Secondary | ICD-10-CM | POA: Diagnosis not present

## 2021-11-17 DIAGNOSIS — C3431 Malignant neoplasm of lower lobe, right bronchus or lung: Secondary | ICD-10-CM | POA: Insufficient documentation

## 2021-11-17 DIAGNOSIS — Z87891 Personal history of nicotine dependence: Secondary | ICD-10-CM | POA: Diagnosis not present

## 2021-11-18 ENCOUNTER — Ambulatory Visit (HOSPITAL_COMMUNITY)
Admission: RE | Admit: 2021-11-18 | Discharge: 2021-11-18 | Disposition: A | Discharge status: Home / Self Care | Payer: Medicare PPO | Source: Ambulatory Visit | Attending: Internal Medicine | Admitting: Internal Medicine

## 2021-11-18 ENCOUNTER — Encounter (HOSPITAL_COMMUNITY): Payer: Self-pay

## 2021-11-18 DIAGNOSIS — C3490 Malignant neoplasm of unspecified part of unspecified bronchus or lung: Secondary | ICD-10-CM | POA: Insufficient documentation

## 2021-11-18 DIAGNOSIS — Z961 Presence of intraocular lens: Secondary | ICD-10-CM | POA: Diagnosis not present

## 2021-11-18 DIAGNOSIS — H52203 Unspecified astigmatism, bilateral: Secondary | ICD-10-CM | POA: Diagnosis not present

## 2021-11-18 DIAGNOSIS — H04123 Dry eye syndrome of bilateral lacrimal glands: Secondary | ICD-10-CM | POA: Diagnosis not present

## 2021-11-18 DIAGNOSIS — E119 Type 2 diabetes mellitus without complications: Secondary | ICD-10-CM | POA: Diagnosis not present

## 2021-11-18 DIAGNOSIS — H524 Presbyopia: Secondary | ICD-10-CM | POA: Diagnosis not present

## 2021-11-18 DIAGNOSIS — G319 Degenerative disease of nervous system, unspecified: Secondary | ICD-10-CM | POA: Diagnosis not present

## 2021-11-18 MED ORDER — GADOBUTROL 1 MMOL/ML IV SOLN
7.0000 mL | Freq: Once | INTRAVENOUS | Status: AC | PRN
  Administered 2021-11-18: 7 mL via INTRAVENOUS

## 2021-11-19 ENCOUNTER — Encounter: Payer: Self-pay | Admitting: Internal Medicine

## 2021-11-19 MED FILL — Dexamethasone Sodium Phosphate Inj 100 MG/10ML: INTRAMUSCULAR | Qty: 1 | Status: AC

## 2021-11-19 NOTE — Progress Notes (Unsigned)
[facility] OFFICE PROGRESS NOTE  Vanessa Neer, MD [address] [place] [address]  DIAGNOSIS: Recurrent non-small cell lung cancer presented as a stage IIIa (T1c, N2, M0) non-small cell lung cancer, adenocarcinoma initially diagnosed as T1a (T1c, N0, M0) non-small cell lung cancer, adenocarcinoma presented with right lower lobe lung nodule in June 2020.  PDL1: 90%   PRIOR THERAPY:  Right video-assisted thoracoscopy, Wedge resection right lower lobe nodule, Thoracoscopic right lower lobectomy, Lymph node dissection on July 26, 2018 under the care of Dr. Roxan Hockey  CURRENT THERAPY: Concurrent chemoradiation with weekly carboplatin for AUC of 2 and paclitaxel 45 Mg/M2.  First dose November 22, 2021.   INTERVAL HISTORY: Vanessa Day 81 y.o. female returns to the clinic today for a follow-up visit accompanied by her husband.  The patient was recently found to have disease recurrence. Therefore, she is expected to start concurrent chemo/radiation today. The patient is feeling fine today with no concerning complaints except for back pain and persistent dry cough. She was supposed to have a steroid back injection through ortho. However, due to issues with the machine being down, this was rescheduled to next week on 12/01/21. For her dry cough, she denies symptoms of infection such as sore throat, nasal congestion, or sick contacts. She will suck on a mint.  She denied having any current chest pain, shortness of breath or hemoptysis.  She has no nausea or vomiting. She has some intermittent diarrhea or constipation which she attributes to nerves. She has no headache or visual changes.  She denied having any recent weight loss or night sweats.  She recently had a brain MRI performed.  She is here today for evaluation and repeat blood work for starting cycle #1.    MEDICAL HISTORY: Past Medical History:  Diagnosis Date   Adenocarcinoma of lung, stage 1, right ([place])  2020   Anxiety    Arthritis    back, feet, jaw - per patient "all over"   Breast cancer ([place]) 2000   Right breast   Family history of breast cancer    GERD (gastroesophageal reflux disease)    Graves disease    H/O hiatal hernia    Hyperlipidemia    Hypertension    IBS (irritable bowel syndrome)    Mitral regurgitation    moderate mitral regurgitation 08/20/20 echo   Neck pain    PAF (paroxysmal atrial fibrillation) ([facility])    ~ 09/2020 in setting of hyperthyroidism   PONV (postoperative nausea and vomiting)    Pre-diabetes     ALLERGIES:  is allergic to hydrocodone, codeine, dicyclomine hcl, hyoscyamine, nitrofurantoin, statins, sudafed [pseudoephedrine], oxycodone-acetaminophen, and sulfa antibiotics.  MEDICATIONS:  Current Outpatient Medications  Medication Sig Dispense Refill   acetaminophen (TYLENOL) 500 MG tablet Take 1 tablet (500 mg total) by mouth every 6 (six) hours as needed for mild pain. (Patient taking differently: Take 1,000 mg by mouth every 6 (six) hours as needed for mild pain.) 30 tablet 0   alosetron (LOTRONEX) 0.5 MG tablet Take 0.25 mg by mouth daily as needed (Diarrhea).     AMBULATORY NON FORMULARY MEDICATION Take 4 capsules by mouth daily. Medication Name: SciatiEase- Sciatic Nerve Formula.     aspirin EC 81 MG tablet Take 1 tablet (81 mg total) by mouth daily. Swallow whole. 90 tablet 3   azelastine (ASTELIN) 0.1 % nasal spray Place 1 spray into both nostrils daily as needed (Congestion). Use in each nostril as directed  celecoxib (CELEBREX) 200 MG capsule Take 200 mg by mouth 2 (two) times daily.     cetirizine (ZYRTEC) 10 MG tablet Take 10 mg by mouth daily as needed for allergies.     ezetimibe (ZETIA) 10 MG tablet Take 10 mg by mouth daily.     hydrochlorothiazide (MICROZIDE) 12.5 MG capsule Take 1 capsule (12.5 mg total) by mouth daily. 45 capsule 3   LORazepam (ATIVAN) 1 MG tablet Take 1-2 mg by mouth at bedtime as needed for sleep.     losartan  (COZAAR) 100 MG tablet Take 100 mg by mouth daily.     methimazole (TAPAZOLE) 5 MG tablet Take 5 mg by mouth daily.     Polyethyl Glycol-Propyl Glycol (SYSTANE OP) Place 1 drop into both eyes 2 (two) times daily as needed (dry eyes).     prochlorperazine (COMPAZINE) 10 MG tablet Take 1 tablet (10 mg total) by mouth every 6 (six) hours as needed for nausea or vomiting. 30 tablet 0   sertraline (ZOLOFT) 50 MG tablet Take 50 mg by mouth at bedtime.     No current facility-administered medications for this visit.    SURGICAL HISTORY:  Past Surgical History:  Procedure Laterality Date   ABDOMINAL HYSTERECTOMY     APPENDECTOMY     BACK SURGERY     BREAST BIOPSY     BREAST SURGERY     CARPOMETACARPEL SUSPENSION PLASTY Left 08/25/2015   Procedure: SUSPENSIONPLASTY LEFT THUMB TRAPEZIUM EXCISION ABDUCTOR POLLICIS LONGUS TRANSFER;  Surgeon: Daryll Brod, MD;  Location: [facility];  Service: Orthopedics;  Laterality: Left;   CHOLECYSTECTOMY     COLONOSCOPY     several   EYE SURGERY     cataract right and left eye   GAS INSERTION  06/23/2011   Procedure: INSERTION OF GAS;  Surgeon: Hayden Pedro, MD;  Location: [facility];  Service: Ophthalmology;  Laterality: Left;  SF6   LOBECTOMY Right 07/26/2018   Procedure: RIGHT LOWER LOBE LOBECTOMY;  Surgeon: Melrose Nakayama, MD;  Location: [facility];  Service: Thoracic;  Laterality: Right;   MASTECTOMY     right breast   SCLERAL BUCKLE  06/23/2011   Procedure: SCLERAL BUCKLE;  Surgeon: Hayden Pedro, MD;  Location: [facility];  Service: Ophthalmology;  Laterality: Left;   SEGMENTECOMY Right 07/26/2018   Procedure: SEGMENTECTOMY;  Surgeon: Melrose Nakayama, MD;  Location: [facility];  Service: Thoracic;  Laterality: Right;   TONSILLECTOMY     VIDEO ASSISTED THORACOSCOPY Right 07/26/2018   Procedure: VIDEO ASSISTED THORACOSCOPY;  Surgeon: Melrose Nakayama, MD;  Location: [facility];  Service: Thoracic;  Laterality: Right;   VIDEO BRONCHOSCOPY  WITH ENDOBRONCHIAL ULTRASOUND N/A 10/27/2021   Procedure: VIDEO BRONCHOSCOPY WITH ENDOBRONCHIAL ULTRASOUND;  Surgeon: Melrose Nakayama, MD;  Location[facility];  Service: Thoracic;  Laterality: N/A;    REVIEW OF SYSTEMS:   Review of Systems  Constitutional: Negative for appetite change, chills, fatigue, fever and unexpected weight change.  HENT: Negative for mouth sores, nosebleeds, sore throat and trouble swallowing.   Eyes: Negative for eye problems and icterus.  Respiratory: Positive for baseline cough. Negative for hemoptysis, shortness of breath and wheezing.   Cardiovascular: Negative for chest pain and leg swelling.  Gastrointestinal: Negative for abdominal pain, constipation, diarrhea, nausea and vomiting.  Genitourinary: Negative for bladder incontinence, difficulty urinating, dysuria, frequency and hematuria.   Musculoskeletal: Positive for back pain.  Negative for gait problem, neck pain and neck stiffness.  Skin: Negative for  itching and rash.  Neurological: Negative for dizziness, extremity weakness, gait problem, headaches, light-headedness and seizures.  Hematological: Negative for adenopathy. Does not bruise/bleed easily.  Psychiatric/Behavioral: Negative for confusion, depression and sleep disturbance. The patient is not nervous/anxious.     PHYSICAL EXAMINATION:  Blood pressure (!) 149/80, pulse 67, temperature 97.9 F (36.6 C), temperature source Temporal, resp. rate 15, weight 156 lb 1.6 oz (70.8 kg), SpO2 97 %.  ECOG PERFORMANCE STATUS: 1  Physical Exam  Constitutional: Oriented to person, place, and time and well-developed, well-nourished, and in no distress.  HENT:  Head: Normocephalic and atraumatic.  Mouth/Throat: Oropharynx is clear and moist. No oropharyngeal exudate.  Eyes: Conjunctivae are normal. Right eye exhibits no discharge. Left eye exhibits no discharge. No scleral icterus.  Neck: Normal range of motion. Neck supple.  Cardiovascular: Normal rate,  regular rhythm, normal heart sounds and intact distal pulses.   Pulmonary/Chest: Effort normal and breath sounds normal. No respiratory distress. No wheezes. No rales.  Abdominal: Soft. Bowel sounds are normal. Exhibits no distension and no mass. There is no tenderness.  Musculoskeletal: Normal range of motion. Exhibits no edema.  Lymphadenopathy:    No cervical adenopathy.  Neurological: Alert and oriented to person, place, and time. Exhibits normal muscle tone. Gait normal. Coordination normal.  Skin: Skin is warm and dry. No rash noted. Not diaphoretic. No erythema. No pallor.  Psychiatric: Mood, memory and judgment normal.  Vitals reviewed.  LABORATORY DATA: Lab Results  Component Value Date   WBC 7.8 11/22/2021   HGB 12.8 11/22/2021   HCT 37.7 11/22/2021   MCV 94.3 11/22/2021   PLT 289 11/22/2021      Chemistry      Component Value Date/Time   NA 141 11/22/2021 1120   K 3.8 11/22/2021 1120   CL 106 11/22/2021 1120   CO2 27 11/22/2021 1120   BUN 32 (H) 11/22/2021 1120   CREATININE 1.44 (H) 11/22/2021 1120      Component Value Date/Time   CALCIUM 8.8 (L) 11/22/2021 1120   ALKPHOS 61 11/22/2021 1120   AST 13 (L) 11/22/2021 1120   ALT 13 11/22/2021 1120   BILITOT 0.8 11/22/2021 1120       RADIOGRAPHIC STUDIES:  MR BRAIN W WO CONTRAST  Result Date: 11/19/2021 CLINICAL DATA:  Provided history: Malignant neoplasm of unspecified part of unspecified bronchus or lung. Non-small cell lung cancer, staging. EXAM: MRI HEAD WITHOUT AND WITH CONTRAST TECHNIQUE: Multiplanar, multiecho pulse sequences of the brain and surrounding structures were obtained without and with intravenous contrast. CONTRAST:  50m GADAVIST GADOBUTROL 1 MMOL/ML IV SOLN COMPARISON:  PET-CT 10/01/2021. FINDINGS: Brain: Mild generalized parenchymal atrophy. Mild for age multifocal T2 FLAIR hyperintense signal abnormality within the cerebral white matter and pons, nonspecific but compatible with chronic small  vessel ischemic disease. Small T2 hyperintense foci within the bilateral deep gray nuclei, likely reflecting a combination of prominent perivascular spaces and chronic lacunar infarcts. Small chronic infarct within the right cerebellar hemisphere. There is no acute infarct. No evidence of an intracranial mass. No chronic intracranial blood products. No extra-axial fluid collection. No midline shift. No pathologic intracranial enhancement identified. Vascular: Maintained flow voids within the proximal large arterial vessels. 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 Skull and upper cervical spine: No focal suspicious marrow lesion. Sinuses/Orbits: No mass or acute finding within the imaged orbits. Left scleral buckle. Prior bilateral ocular lens replacement. Small mucous retention cysts within the right sphenoid sinus.  Mild mucosal thickening within left ethmoid air cells. Other: Trace fluid within the right mastoid air cells. IMPRESSION: No evidence of intracranial metastatic disease. Parenchymal atrophy and chronic small vessel ischemic disease, as described. Small chronic infarct within the right cerebellar hemisphere. Electronically Signed   By: Kellie Simmering D.O.   On: 11/19/2021 07:47   DG BONE DENSITY (DXA)  Result Date: 11/04/2021 EXAM: DUAL X-RAY ABSORPTIOMETRY (DXA) FOR BONE MINERAL DENSITY IMPRESSION: Referring Physician:  Mayra Day Your patient completed a bone mineral density test using GE Lunar iDXA system (analysis version: 16). Technologist: EDH PATIENT: Name: Vanessa Day, Vanessa Day Patient ID: [id number] Birth Date: [birth year] Height: 62.0 in. Sex: Female Measured: 11/04/2021 Weight: 154.2 lbs. Indications: Advanced Age, Bilateral Ovariectomy (65.51), Breast Cancer History, Caucasian, Estrogen Deficient, History of Fracture (Adult) (V15.51), Hyperthyroid (242.9), Hysterectomy, Lung Cancer, Methimazole, Postmenopausal Fractures:  Calcaneus Treatments: ASSESSMENT: The BMD measured at Forearm Radius 33% is 0.713 g/cm2 with a T-score of -1.9. This patient is considered osteopenic/low bone mass according to [facility]) criteria. The quality of the exam is good. The lumbar spine was excluded due to being excluded on prior exam. Site Region Measured Date Measured Age YA BMD Significant CHANGE T-score Left Forearm Radius 33% 11/04/2021 81.9 -1.9 0.713 g/cm2 Left Forearm Radius 33% 04/29/2019 79.4 -2.0 0.711 g/cm2 DualFemur Neck Left 11/04/2021 81.9 -1.6 0.814 g/cm2 DualFemur Neck Left 04/29/2019 79.4 -1.7 0.805 g/cm2 DualFemur Total Mean 11/04/2021 81.9 -0.3 0.967 g/cm2 * DualFemur Total Mean 04/29/2019 79.4 -0.6 0.928 g/cm2 World Health Organization [facility]) criteria for post-menopausal, Caucasian Women: Normal       T-score at or above -1 SD Osteopenia   T-score between -1 and -2.5 SD Osteoporosis T-score at or below -2.5 SD RECOMMENDATION: 1. All patients should optimize calcium and vitamin D intake. 2. Consider FDA-approved medical therapies in postmenopausal women and men aged 70 years and older, based on the following: a. A hip or vertebral (clinical or morphometric) fracture. b. T-score = -2.5 at the femoral neck or spine after appropriate evaluation to exclude secondary causes. c. Low bone mass (T-score between -1.0 and -2.5 at the femoral neck or spine) and a 10-year probability of a hip fracture = 3% or a 10-year probability of a major osteoporosis-related fracture = 20% based on the [place]-adapted WHO algorithm. d. Clinician judgment and/or patient preferences may indicate treatment for people with 10-year fracture probabilities above or below these levels. FOLLOW-UP: Patients with diagnosis of osteoporosis or at high risk for fracture should have regular bone mineral density tests. Patients eligible for Medicare are allowed routine testing every 2 years. The testing frequency can be increased to one year for patients who  have rapidly progressing disease, are receiving or discontinuing medical therapy to restore bone mass, or have additional risk factors. I have reviewed this study and agree with the findings. [facility] FRAX* 10-year Probability of Fracture Based on femoral neck BMD: DualFemur (Left) Major Osteoporotic Fracture: 20.0% Hip Fracture:                4.8% Population:                  [place] (Caucasian) Risk Factors:                History of Fracture (Adult) (V15.51) *FRAX is a Materials engineer of the [employer] of [employer] for Metabolic Bone Disease, a [place] (WHO) [facility]. ASSESSMENT: The probability of a major osteoporotic fracture is 20.0% within  the next ten years. The probability of a hip fracture is 4.8% within the next ten years. Electronically Signed   By: Ammie Ferrier M.D.   On: 11/04/2021 08:21   DG Chest 2 View  Result Date: 10/26/2021 CLINICAL DATA:  Lung biopsy 10/27/2021.  Abnormal PET CT. EXAM: CHEST - 2 VIEW COMPARISON:  PET-CT 10/01/2021. CT chest 09/14/2021. Chest x-ray 08/18/2020, 08/07/2018. FINDINGS: Mediastinum and hilar structures normal. Heart size normal. Pleural-parenchymal thickening on the right consistent with scarring again noted. PET positive pleural nodularity previously identified on recent PET-CT best seen by PET-CT. No acute infiltrates. No pleural effusion or pneumothorax. No acute bony abnormality. Aortic atherosclerotic vascular calcification. Surgical clips noted over the chest and abdomen. IMPRESSION: Pleural-parenchymal thickening on the right consistent with scarring again noted. PET positive pleural nodularity previously identified on recent PET-CT best seen by PET-CT. No acute infiltrates. No pneumothorax. Electronically Signed   By: Marcello Moores  Register M.D.   On: 10/26/2021 10:37     ASSESSMENT/PLAN:  This is a very pleasant 81 year old Caucasian female diagnosed with stage IIIa (T1c, N2, M0) non-small  cell lung cancer, adenocarcinoma.  The patient was initially diagnosed as a stage Ia (T1c, N0, M0) non-small cell lung cancer, adenocarcinoma.  The patient is status post a right lower lobectomy with lymph node dissection July 2020.  She had disease recurrence in October 2023.  Her PD-L1 expression is 90%.  The patient recently had a brain MRI for staging which was negative for metastatic disease.  The patient is currently undergoing concurrent chemoradiation with carboplatin for an AUC of 2 and paclitaxel 45 mg per metered squared.  The patient is expected to start her first dose of treatment today.  She is also expected to start her radiation treatment tomorrow.  This treatment will be followed by consolidation immunotherapy if she has no evidence of disease question after the induction phase.  The patient was seen with Dr. Julien Nordmann today.  Labs were reviewed.  Recommend that she proceed with cycle #1 today's schedule.  We will see her back for follow-up visit in 2 weeks for evaluation and repeat blood work before undergoing cycle #3.  Discussed she is okay to get her joint injection next week orthopedics for her back pain.  The patient was advised to call immediately if she has any concerning symptoms in the interval. The patient voices understanding of current disease status and treatment options and is in agreement with the current care plan. All questions were answered. The patient knows to call the clinic with any problems, questions or concerns. We can certainly see the patient much sooner if necessary  No orders of the defined types were placed in this encounter.     Cassandra L Heilingoetter, PA-C 11/22/21  ADDENDUM: Hematology/Oncology Attending: I had a face-to-face encounter with the patient today.  I reviewed her records, lab, scan and recommended her care plan.  This is a very pleasant 81 years old white female with stage IIIa non-small cell lung cancer, adenocarcinoma that was  initially diagnosed as a stage I in July 2023 status post right lower lobectomy with lymph node dissection but she had recurrence in October 2023.  PD-L1 expression is 90% and molecular studies are still pending. The patient had MRI of the brain that showed no concerning findings for metastatic disease to the brain. I recommended for her to proceed with the first cycle of her treatment with concurrent chemoradiation with weekly carboplatin for AUC of 2 and paclitaxel 45 Mg/M2.  First dose today. We will see her back for follow-up visit in 2 weeks for evaluation and management of any adverse effect of her treatment. The patient was advised to call immediately if she has any other concerning symptoms in the interval. The total time spent in the appointment was 30 minutes. Disclaimer: This note was dictated with voice recognition software. Similar sounding words can inadvertently be transcribed and may be missed upon review. Eilleen Kempf, MD

## 2021-11-19 NOTE — Progress Notes (Signed)
Called pt to introduce myself as her Arboriculturist and to discuss the [employer].  I went over what it covers but she declined wanting to leave it for others in greater need.  I will request the registration staff give her my card in case she changes her mind and or any questions or concerns she may have in the future.

## 2021-11-21 DIAGNOSIS — Z510 Encounter for antineoplastic radiation therapy: Secondary | ICD-10-CM | POA: Diagnosis not present

## 2021-11-21 DIAGNOSIS — C3431 Malignant neoplasm of lower lobe, right bronchus or lung: Secondary | ICD-10-CM | POA: Diagnosis not present

## 2021-11-21 DIAGNOSIS — Z87891 Personal history of nicotine dependence: Secondary | ICD-10-CM | POA: Diagnosis not present

## 2021-11-22 ENCOUNTER — Other Ambulatory Visit: Payer: Self-pay

## 2021-11-22 ENCOUNTER — Ambulatory Visit
Admission: RE | Admit: 2021-11-22 | Discharge: 2021-11-22 | Disposition: A | Discharge status: Home / Self Care | Payer: Medicare PPO | Source: Ambulatory Visit | Attending: Radiation Oncology | Admitting: Radiation Oncology

## 2021-11-22 ENCOUNTER — Inpatient Hospital Stay (HOSPITAL_BASED_OUTPATIENT_CLINIC_OR_DEPARTMENT_OTHER): Payer: Medicare PPO | Admitting: Physician Assistant

## 2021-11-22 ENCOUNTER — Other Ambulatory Visit: Payer: Self-pay | Admitting: Physician Assistant

## 2021-11-22 ENCOUNTER — Telehealth: Payer: Self-pay | Admitting: Physician Assistant

## 2021-11-22 ENCOUNTER — Inpatient Hospital Stay: Payer: Medicare PPO

## 2021-11-22 VITALS — BP 134/79 | HR 55 | Resp 17

## 2021-11-22 VITALS — BP 149/80 | HR 67 | Temp 97.9°F | Resp 15 | Wt 156.1 lb

## 2021-11-22 DIAGNOSIS — Z5111 Encounter for antineoplastic chemotherapy: Secondary | ICD-10-CM | POA: Diagnosis not present

## 2021-11-22 DIAGNOSIS — C3490 Malignant neoplasm of unspecified part of unspecified bronchus or lung: Secondary | ICD-10-CM

## 2021-11-22 DIAGNOSIS — C3431 Malignant neoplasm of lower lobe, right bronchus or lung: Secondary | ICD-10-CM

## 2021-11-22 DIAGNOSIS — Z510 Encounter for antineoplastic radiation therapy: Secondary | ICD-10-CM | POA: Diagnosis not present

## 2021-11-22 DIAGNOSIS — Z87891 Personal history of nicotine dependence: Secondary | ICD-10-CM | POA: Diagnosis not present

## 2021-11-22 DIAGNOSIS — C3491 Malignant neoplasm of unspecified part of right bronchus or lung: Secondary | ICD-10-CM | POA: Diagnosis not present

## 2021-11-22 LAB — CMP (CANCER CENTER ONLY)
ALT: 13 U/L (ref 0–44)
AST: 13 U/L — ABNORMAL LOW (ref 15–41)
Albumin: 4.1 g/dL (ref 3.5–5.0)
Alkaline Phosphatase: 61 U/L (ref 38–126)
Anion gap: 8 (ref 5–15)
BUN: 32 mg/dL — ABNORMAL HIGH (ref 8–23)
CO2: 27 mmol/L (ref 22–32)
Calcium: 8.8 mg/dL — ABNORMAL LOW (ref 8.9–10.3)
Chloride: 106 mmol/L (ref 98–111)
Creatinine: 1.44 mg/dL — ABNORMAL HIGH (ref 0.44–1.00)
GFR, Estimated: 37 mL/min — ABNORMAL LOW (ref >60)
Glucose, Bld: 108 mg/dL — ABNORMAL HIGH (ref 70–99)
Potassium: 3.8 mmol/L (ref 3.5–5.1)
Sodium: 141 mmol/L (ref 135–145)
Total Bilirubin: 0.8 mg/dL (ref 0.3–1.2)
Total Protein: 6.8 g/dL (ref 6.5–8.1)

## 2021-11-22 LAB — RAD ONC ARIA SESSION SUMMARY
Course Elapsed Days: 0
Plan Fractions Treated to Date: 1
Plan Prescribed Dose Per Fraction: 2 Gy
Plan Total Fractions Prescribed: 30
Plan Total Prescribed Dose: 60 Gy
Reference Point Dosage Given to Date: 2 Gy
Reference Point Session Dosage Given: 2 Gy
Session Number: 1

## 2021-11-22 LAB — CBC WITH DIFFERENTIAL (CANCER CENTER ONLY)
Abs Immature Granulocytes: 0.02 10*3/uL (ref 0.00–0.07)
Basophils Absolute: 0 10*3/uL (ref 0.0–0.1)
Basophils Relative: 1 %
Eosinophils Absolute: 0.1 10*3/uL (ref 0.0–0.5)
Eosinophils Relative: 1 %
HCT: 37.7 % (ref 36.0–46.0)
Hemoglobin: 12.8 g/dL (ref 12.0–15.0)
Immature Granulocytes: 0 %
Lymphocytes Relative: 28 %
Lymphs Abs: 2.2 10*3/uL (ref 0.7–4.0)
MCH: 32 pg (ref 26.0–34.0)
MCHC: 34 g/dL (ref 30.0–36.0)
MCV: 94.3 fL (ref 80.0–100.0)
Monocytes Absolute: 0.6 10*3/uL (ref 0.1–1.0)
Monocytes Relative: 8 %
Neutro Abs: 4.9 10*3/uL (ref 1.7–7.7)
Neutrophils Relative %: 62 %
Platelet Count: 289 10*3/uL (ref 150–400)
RBC: 4 MIL/uL (ref 3.87–5.11)
RDW: 13.2 % (ref 11.5–15.5)
WBC Count: 7.8 10*3/uL (ref 4.0–10.5)
nRBC: 0 % (ref 0.0–0.2)

## 2021-11-22 MED ORDER — SODIUM CHLORIDE 0.9 % IV SOLN: Freq: Once | INTRAVENOUS | Status: AC

## 2021-11-22 MED ORDER — SODIUM CHLORIDE 0.9 % IV SOLN
45.0000 mg/m2 | Freq: Once | INTRAVENOUS | Status: AC
  Administered 2021-11-22: 78 mg via INTRAVENOUS
  Filled 2021-11-22: qty 13

## 2021-11-22 MED ORDER — SODIUM CHLORIDE 0.9 % IV SOLN
10.0000 mg | Freq: Once | INTRAVENOUS | Status: AC
  Administered 2021-11-22: 10 mg via INTRAVENOUS
  Filled 2021-11-22: qty 10

## 2021-11-22 MED ORDER — FAMOTIDINE IN NACL 20-0.9 MG/50ML-% IV SOLN
20.0000 mg | Freq: Once | INTRAVENOUS | Status: AC
  Administered 2021-11-22: 20 mg via INTRAVENOUS
  Filled 2021-11-22: qty 50

## 2021-11-22 MED ORDER — LIDOCAINE-PRILOCAINE 2.5-2.5 % EX CREA: 1.0000 | TOPICAL_CREAM | CUTANEOUS | 2 refills | Status: DC | PRN

## 2021-11-22 MED ORDER — SODIUM CHLORIDE 0.9 % IV SOLN
114.0000 mg | Freq: Once | INTRAVENOUS | Status: AC
  Administered 2021-11-22: 110 mg via INTRAVENOUS
  Filled 2021-11-22: qty 11

## 2021-11-22 MED ORDER — CETIRIZINE HCL 10 MG/ML IV SOLN
10.0000 mg | Freq: Once | INTRAVENOUS | Status: AC
  Administered 2021-11-22: 10 mg via INTRAVENOUS
  Filled 2021-11-22: qty 1

## 2021-11-22 MED ORDER — PALONOSETRON HCL INJECTION 0.25 MG/5ML
0.2500 mg | Freq: Once | INTRAVENOUS | Status: AC
  Administered 2021-11-22: 0.25 mg via INTRAVENOUS
  Filled 2021-11-22: qty 5

## 2021-11-22 NOTE — Progress Notes (Signed)
I talked to pathology. They are expecting her foundation one results on 11/29/21

## 2021-11-22 NOTE — Telephone Encounter (Signed)
Dr. Julien Nordmann updated me that he did not see any results in the portal for foundation 1 testing.  I called the pathology lab who informed me that this was sent out on 11/02/2021.  I called the pathology lab.  They are going to call FoundationOne to ensure there was not an issue with the patient's tissue.  If there is no enough tissue to run foundation 1 testing, then I requested that they send the patient's resection from July 2nd, 2020 for foundation testing.

## 2021-11-22 NOTE — Patient Instructions (Addendum)
[facility]  Discharge Instructions: Thank you for choosing [facility] to provide your oncology and hematology care.   If you have a lab appointment with the [facility], please go directly to the [facility] and check in at the registration area.   Wear comfortable clothing and clothing appropriate for easy access to any Portacath or PICC line.   We strive to give you quality time with your provider. You may need to reschedule your appointment if you arrive late (15 or more minutes).  Arriving late affects you and other patients whose appointments are after yours.  Also, if you miss three or more appointments without notifying the office, you may be dismissed from the clinic at the provider's discretion.      For prescription refill requests, have your pharmacy contact our office and allow 72 hours for refills to be completed.    Today you received the following chemotherapy and/or immunotherapy agents: Taxol (paclitaxel)/Carboplatin      To help prevent nausea and vomiting after your treatment, we encourage you to take your nausea medication as directed.  BELOW ARE SYMPTOMS THAT SHOULD BE REPORTED IMMEDIATELY: *FEVER GREATER THAN 100.4 F (38 C) OR HIGHER *CHILLS OR SWEATING *NAUSEA AND VOMITING THAT IS NOT CONTROLLED WITH YOUR NAUSEA MEDICATION *UNUSUAL SHORTNESS OF BREATH *UNUSUAL BRUISING OR BLEEDING *URINARY PROBLEMS (pain or burning when urinating, or frequent urination) *BOWEL PROBLEMS (unusual diarrhea, constipation, pain near the anus) TENDERNESS IN MOUTH AND THROAT WITH OR WITHOUT PRESENCE OF ULCERS (sore throat, sores in mouth, or a toothache) UNUSUAL RASH, SWELLING OR PAIN  UNUSUAL VAGINAL DISCHARGE OR ITCHING   Items with * indicate a potential emergency and should be followed up as soon as possible or go to the Emergency Department if any problems should occur.  Please show the CHEMOTHERAPY ALERT CARD or IMMUNOTHERAPY ALERT  CARD at check-in to the Emergency Department and triage nurse.  Should you have questions after your visit or need to cancel or reschedule your appointment, please contact [facility]  Dept: [phone]  and follow the prompts.  Office hours are 8:00 a.m. to 4:30 p.m. Monday - Friday. Please note that voicemails left after 4:00 p.m. may not be returned until the following business day.  We are closed weekends and major holidays. You have access to a nurse at all times for urgent questions. Please call the main number to the clinic Dept: [phone] and follow the prompts.   For any non-urgent questions, you may also contact your provider using MyChart. We now offer e-Visits for anyone 85 and older to request care online for non-urgent symptoms. For details visit [email or web address].   Also download the MyChart app! Go to the app store, search "MyChart", open the app, select [facility], and log in with your MyChart username and password.  Masks are optional in the cancer centers. If you would like for your care team to wear a mask while they are taking care of you, please let them know. You may have one support person who is at least 82 years old accompany you for your appointments. Paclitaxel Injection What is this medication? PACLITAXEL (PAK li TAX el) treats some types of cancer. It works by slowing down the growth of cancer cells. This medicine may be used for other purposes; ask your health care provider or pharmacist if you have questions. COMMON BRAND NAME(S): Onxol, Taxol What should I tell my care team before I take this  medication? They need to know if you have any of these conditions: Heart disease Liver disease Low white blood cell levels An unusual or allergic reaction to paclitaxel, other medications, foods, dyes, or preservatives If you or your partner are pregnant or trying to get pregnant Breast-feeding How should I use this  medication? This medication is injected into a vein. It is given by your care team in a hospital or clinic setting. Talk to your care team about the use of this medication in children. While it may be given to children for selected conditions, precautions do apply. Overdosage: If you think you have taken too much of this medicine contact a poison control center or emergency room at once. NOTE: This medicine is only for you. Do not share this medicine with others. What if I miss a dose? Keep appointments for follow-up doses. It is important not to miss your dose. Call your care team if you are unable to keep an appointment. What may interact with this medication? Do not take this medication with any of the following: Live virus vaccines Other medications may affect the way this medication works. Talk with your care team about all of the medications you take. They may suggest changes to your treatment plan to lower the risk of side effects and to make sure your medications work as intended. This list may not describe all possible interactions. Give your health care provider a list of all the medicines, herbs, non-prescription drugs, or dietary supplements you use. Also tell them if you smoke, drink alcohol, or use illegal drugs. Some items may interact with your medicine. What should I watch for while using this medication? Your condition will be monitored carefully while you are receiving this medication. You may need blood work while taking this medication. This medication may make you feel generally unwell. This is not uncommon as chemotherapy can affect healthy cells as well as cancer cells. Report any side effects. Continue your course of treatment even though you feel ill unless your care team tells you to stop. This medication can cause serious allergic reactions. To reduce the risk, your care team may give you other medications to take before receiving this one. Be sure to follow the directions  from your care team. This medication may increase your risk of getting an infection. Call your care team for advice if you get a fever, chills, sore throat, or other symptoms of a cold or flu. Do not treat yourself. Try to avoid being around people who are sick. This medication may increase your risk to bruise or bleed. Call your care team if you notice any unusual bleeding. Be careful brushing or flossing your teeth or using a toothpick because you may get an infection or bleed more easily. If you have any dental work done, tell your dentist you are receiving this medication. Talk to your care team if you may be pregnant. Serious birth defects can occur if you take this medication during pregnancy. Talk to your care team before breastfeeding. Changes to your treatment plan may be needed. What side effects may I notice from receiving this medication? Side effects that you should report to your care team as soon as possible: Allergic reactions--skin rash, itching, hives, swelling of the face, lips, tongue, or throat Heart rhythm changes--fast or irregular heartbeat, dizziness, feeling faint or lightheaded, chest pain, trouble breathing Increase in blood pressure Infection--fever, chills, cough, sore throat, wounds that don't heal, pain or trouble when passing urine, general feeling of  discomfort or being unwell Low blood pressure--dizziness, feeling faint or lightheaded, blurry vision Low red blood cell level--unusual weakness or fatigue, dizziness, headache, trouble breathing Painful swelling, warmth, or redness of the skin, blisters or sores at the infusion site Pain, tingling, or numbness in the hands or feet Slow heartbeat--dizziness, feeling faint or lightheaded, confusion, trouble breathing, unusual weakness or fatigue Unusual bruising or bleeding Side effects that usually do not require medical attention (report to your care team if they continue or are bothersome): Diarrhea Hair  loss Joint pain Loss of appetite Muscle pain Nausea Vomiting This list may not describe all possible side effects. Call your doctor for medical advice about side effects. You may report side effects to FDA at 1-800-FDA-1088. Where should I keep my medication? This medication is given in a hospital or clinic. It will not be stored at home. NOTE: This sheet is a summary. It may not cover all possible information. If you have questions about this medicine, talk to your doctor, pharmacist, or health care provider.  2023 Elsevier/Gold Standard (2021-05-12 00:00:00) Carboplatin Injection What is this medication? CARBOPLATIN (KAR boe pla tin) treats some types of cancer. It works by slowing down the growth of cancer cells. This medicine may be used for other purposes; ask your health care provider or pharmacist if you have questions. COMMON BRAND NAME(S): Paraplatin What should I tell my care team before I take this medication? They need to know if you have any of these conditions: Blood disorders Hearing problems Kidney disease Recent or ongoing radiation therapy An unusual or allergic reaction to carboplatin, cisplatin, other medications, foods, dyes, or preservatives Pregnant or trying to get pregnant Breast-feeding How should I use this medication? This medication is injected into a vein. It is given by your care team in a hospital or clinic setting. Talk to your care team about the use of this medication in children. Special care may be needed. Overdosage: If you think you have taken too much of this medicine contact a poison control center or emergency room at once. NOTE: This medicine is only for you. Do not share this medicine with others. What if I miss a dose? Keep appointments for follow-up doses. It is important not to miss your dose. Call your care team if you are unable to keep an appointment. What may interact with this medication? Medications for seizures Some antibiotics,  such as amikacin, gentamicin, neomycin, streptomycin, tobramycin Vaccines This list may not describe all possible interactions. Give your health care provider a list of all the medicines, herbs, non-prescription drugs, or dietary supplements you use. Also tell them if you smoke, drink alcohol, or use illegal drugs. Some items may interact with your medicine. What should I watch for while using this medication? Your condition will be monitored carefully while you are receiving this medication. You may need blood work while taking this medication. This medication may make you feel generally unwell. This is not uncommon, as chemotherapy can affect healthy cells as well as cancer cells. Report any side effects. Continue your course of treatment even though you feel ill unless your care team tells you to stop. In some cases, you may be given additional medications to help with side effects. Follow all directions for their use. This medication may increase your risk of getting an infection. Call your care team for advice if you get a fever, chills, sore throat, or other symptoms of a cold or flu. Do not treat yourself. Try to avoid being around  people who are sick. Avoid taking medications that contain aspirin, acetaminophen, ibuprofen, naproxen, or ketoprofen unless instructed by your care team. These medications may hide a fever. Be careful brushing or flossing your teeth or using a toothpick because you may get an infection or bleed more easily. If you have any dental work done, tell your dentist you are receiving this medication. Talk to your care team if you wish to become pregnant or think you might be pregnant. This medication can cause serious birth defects. Talk to your care team about effective forms of contraception. Do not breast-feed while taking this medication. What side effects may I notice from receiving this medication? Side effects that you should report to your care team as soon as  possible: Allergic reactions--skin rash, itching, hives, swelling of the face, lips, tongue, or throat Infection--fever, chills, cough, sore throat, wounds that don't heal, pain or trouble when passing urine, general feeling of discomfort or being unwell Low red blood cell level--unusual weakness or fatigue, dizziness, headache, trouble breathing Pain, tingling, or numbness in the hands or feet, muscle weakness, change in vision, confusion or trouble speaking, loss of balance or coordination, trouble walking, seizures Unusual bruising or bleeding Side effects that usually do not require medical attention (report to your care team if they continue or are bothersome): Hair loss Nausea Unusual weakness or fatigue Vomiting This list may not describe all possible side effects. Call your doctor for medical advice about side effects. You may report side effects to FDA at 1-800-FDA-1088. Where should I keep my medication? This medication is given in a hospital or clinic. It will not be stored at home. NOTE: This sheet is a summary. It may not cover all possible information. If you have questions about this medicine, talk to your doctor, pharmacist, or health care provider.  2023 Elsevier/Gold Standard (2021-04-26 00:00:00)

## 2021-11-23 ENCOUNTER — Telehealth: Payer: Self-pay | Admitting: *Deleted

## 2021-11-23 ENCOUNTER — Other Ambulatory Visit: Payer: Self-pay

## 2021-11-23 ENCOUNTER — Telehealth: Payer: Self-pay

## 2021-11-23 ENCOUNTER — Encounter: Payer: Self-pay | Admitting: Internal Medicine

## 2021-11-23 ENCOUNTER — Ambulatory Visit
Admission: RE | Admit: 2021-11-23 | Discharge: 2021-11-23 | Disposition: A | Discharge status: Home / Self Care | Payer: Medicare PPO | Source: Ambulatory Visit | Attending: Radiation Oncology | Admitting: Radiation Oncology

## 2021-11-23 DIAGNOSIS — Z87891 Personal history of nicotine dependence: Secondary | ICD-10-CM | POA: Diagnosis not present

## 2021-11-23 DIAGNOSIS — C3431 Malignant neoplasm of lower lobe, right bronchus or lung: Secondary | ICD-10-CM | POA: Diagnosis not present

## 2021-11-23 DIAGNOSIS — Z510 Encounter for antineoplastic radiation therapy: Secondary | ICD-10-CM | POA: Diagnosis not present

## 2021-11-23 LAB — RAD ONC ARIA SESSION SUMMARY
Course Elapsed Days: 1
Plan Fractions Treated to Date: 2
Plan Prescribed Dose Per Fraction: 2 Gy
Plan Total Fractions Prescribed: 30
Plan Total Prescribed Dose: 60 Gy
Reference Point Dosage Given to Date: 4 Gy
Reference Point Session Dosage Given: 2 Gy
Session Number: 2

## 2021-11-23 NOTE — Telephone Encounter (Signed)
This nurse returned call to this patient related to when her port placement will be scheduled.  This nurse advised patient that the order for the placement was entered yesterday 10/30 during her office visit and the procedure has to go through an approval process before it can be scheduled.  Advised that this nurse did speak to the IR department and was advised that the soonest they can schedule is 11/9 or 11/10.  Patient stated if it cannot be scheduled sooner she may cancel the request for a port because she will no longer need it after 12/15. This nurse advised that her concerns will forwarded to the provider.  Patient also advised that the IR department will give her a call with all the details to get her scheduled.  She acknowledged understanding.  No further questions or concerns noted at this time.

## 2021-11-23 NOTE — Telephone Encounter (Signed)
-----   Message from [name], RN sent at 11/22/2021  5:18 PM EDT ----- Regarding: Vanessa Day 1ST TIME TAXOL/CARBO Patient received 1st time taxol/carbo.  Tolerated well.  No s/s or c/o discomfort or distress.

## 2021-11-23 NOTE — Telephone Encounter (Signed)
Called for chemo follow up. Pt states she have a great experience. She denies any nausea or vomiting. Is eating and drinking without problems.  Is asking about port placement, had to be stuck 2 times for access. MD aware of port request.

## 2021-11-24 ENCOUNTER — Ambulatory Visit
Admission: RE | Admit: 2021-11-24 | Discharge: 2021-11-24 | Disposition: A | Discharge status: Home / Self Care | Payer: Medicare PPO | Source: Ambulatory Visit | Attending: Radiation Oncology | Admitting: Radiation Oncology

## 2021-11-24 ENCOUNTER — Other Ambulatory Visit: Payer: Self-pay

## 2021-11-24 DIAGNOSIS — I10 Essential (primary) hypertension: Secondary | ICD-10-CM | POA: Insufficient documentation

## 2021-11-24 DIAGNOSIS — M549 Dorsalgia, unspecified: Secondary | ICD-10-CM | POA: Insufficient documentation

## 2021-11-24 DIAGNOSIS — R1310 Dysphagia, unspecified: Secondary | ICD-10-CM | POA: Insufficient documentation

## 2021-11-24 DIAGNOSIS — Z853 Personal history of malignant neoplasm of breast: Secondary | ICD-10-CM | POA: Diagnosis not present

## 2021-11-24 DIAGNOSIS — Z5111 Encounter for antineoplastic chemotherapy: Secondary | ICD-10-CM | POA: Insufficient documentation

## 2021-11-24 DIAGNOSIS — Z7982 Long term (current) use of aspirin: Secondary | ICD-10-CM | POA: Diagnosis not present

## 2021-11-24 DIAGNOSIS — Z902 Acquired absence of lung [part of]: Secondary | ICD-10-CM | POA: Diagnosis not present

## 2021-11-24 DIAGNOSIS — C3431 Malignant neoplasm of lower lobe, right bronchus or lung: Secondary | ICD-10-CM | POA: Diagnosis not present

## 2021-11-24 DIAGNOSIS — Z79899 Other long term (current) drug therapy: Secondary | ICD-10-CM | POA: Insufficient documentation

## 2021-11-24 DIAGNOSIS — Z510 Encounter for antineoplastic radiation therapy: Secondary | ICD-10-CM | POA: Diagnosis not present

## 2021-11-24 DIAGNOSIS — I480 Paroxysmal atrial fibrillation: Secondary | ICD-10-CM | POA: Insufficient documentation

## 2021-11-24 DIAGNOSIS — Z87891 Personal history of nicotine dependence: Secondary | ICD-10-CM | POA: Diagnosis not present

## 2021-11-24 LAB — RAD ONC ARIA SESSION SUMMARY
Course Elapsed Days: 2
Plan Fractions Treated to Date: 3
Plan Prescribed Dose Per Fraction: 2 Gy
Plan Total Fractions Prescribed: 30
Plan Total Prescribed Dose: 60 Gy
Reference Point Dosage Given to Date: 6 Gy
Reference Point Session Dosage Given: 2 Gy
Session Number: 3

## 2021-11-25 ENCOUNTER — Other Ambulatory Visit: Payer: Self-pay

## 2021-11-25 ENCOUNTER — Ambulatory Visit
Admission: RE | Admit: 2021-11-25 | Discharge: 2021-11-25 | Disposition: A | Discharge status: Home / Self Care | Payer: Medicare PPO | Source: Ambulatory Visit | Attending: Radiation Oncology | Admitting: Radiation Oncology

## 2021-11-25 DIAGNOSIS — Z853 Personal history of malignant neoplasm of breast: Secondary | ICD-10-CM | POA: Diagnosis not present

## 2021-11-25 DIAGNOSIS — C3431 Malignant neoplasm of lower lobe, right bronchus or lung: Secondary | ICD-10-CM | POA: Diagnosis not present

## 2021-11-25 DIAGNOSIS — I480 Paroxysmal atrial fibrillation: Secondary | ICD-10-CM | POA: Diagnosis not present

## 2021-11-25 DIAGNOSIS — M549 Dorsalgia, unspecified: Secondary | ICD-10-CM | POA: Diagnosis not present

## 2021-11-25 DIAGNOSIS — Z87891 Personal history of nicotine dependence: Secondary | ICD-10-CM | POA: Diagnosis not present

## 2021-11-25 DIAGNOSIS — Z5111 Encounter for antineoplastic chemotherapy: Secondary | ICD-10-CM | POA: Diagnosis not present

## 2021-11-25 DIAGNOSIS — Z902 Acquired absence of lung [part of]: Secondary | ICD-10-CM | POA: Diagnosis not present

## 2021-11-25 DIAGNOSIS — R1310 Dysphagia, unspecified: Secondary | ICD-10-CM | POA: Diagnosis not present

## 2021-11-25 DIAGNOSIS — Z510 Encounter for antineoplastic radiation therapy: Secondary | ICD-10-CM | POA: Diagnosis not present

## 2021-11-25 DIAGNOSIS — I10 Essential (primary) hypertension: Secondary | ICD-10-CM | POA: Diagnosis not present

## 2021-11-25 LAB — RAD ONC ARIA SESSION SUMMARY
Course Elapsed Days: 3
Plan Fractions Treated to Date: 4
Plan Prescribed Dose Per Fraction: 2 Gy
Plan Total Fractions Prescribed: 30
Plan Total Prescribed Dose: 60 Gy
Reference Point Dosage Given to Date: 8 Gy
Reference Point Session Dosage Given: 2 Gy
Session Number: 4

## 2021-11-26 ENCOUNTER — Other Ambulatory Visit: Payer: Self-pay

## 2021-11-26 ENCOUNTER — Ambulatory Visit
Admission: RE | Admit: 2021-11-26 | Discharge: 2021-11-26 | Disposition: A | Discharge status: Home / Self Care | Payer: Medicare PPO | Source: Ambulatory Visit | Attending: Radiation Oncology | Admitting: Radiation Oncology

## 2021-11-26 DIAGNOSIS — Z5111 Encounter for antineoplastic chemotherapy: Secondary | ICD-10-CM | POA: Diagnosis not present

## 2021-11-26 DIAGNOSIS — Z902 Acquired absence of lung [part of]: Secondary | ICD-10-CM | POA: Diagnosis not present

## 2021-11-26 DIAGNOSIS — C3431 Malignant neoplasm of lower lobe, right bronchus or lung: Secondary | ICD-10-CM

## 2021-11-26 DIAGNOSIS — R1310 Dysphagia, unspecified: Secondary | ICD-10-CM | POA: Diagnosis not present

## 2021-11-26 DIAGNOSIS — I10 Essential (primary) hypertension: Secondary | ICD-10-CM | POA: Diagnosis not present

## 2021-11-26 DIAGNOSIS — I480 Paroxysmal atrial fibrillation: Secondary | ICD-10-CM | POA: Diagnosis not present

## 2021-11-26 DIAGNOSIS — Z87891 Personal history of nicotine dependence: Secondary | ICD-10-CM | POA: Diagnosis not present

## 2021-11-26 DIAGNOSIS — M549 Dorsalgia, unspecified: Secondary | ICD-10-CM | POA: Diagnosis not present

## 2021-11-26 DIAGNOSIS — Z853 Personal history of malignant neoplasm of breast: Secondary | ICD-10-CM | POA: Diagnosis not present

## 2021-11-26 DIAGNOSIS — Z510 Encounter for antineoplastic radiation therapy: Secondary | ICD-10-CM | POA: Diagnosis not present

## 2021-11-26 LAB — RAD ONC ARIA SESSION SUMMARY
Course Elapsed Days: 4
Plan Fractions Treated to Date: 5
Plan Prescribed Dose Per Fraction: 2 Gy
Plan Total Fractions Prescribed: 30
Plan Total Prescribed Dose: 60 Gy
Reference Point Dosage Given to Date: 10 Gy
Reference Point Session Dosage Given: 2 Gy
Session Number: 5

## 2021-11-26 MED ORDER — SONAFINE EX EMUL
1.0000 | Freq: Once | CUTANEOUS | Status: AC
  Administered 2021-11-26: 1 via TOPICAL

## 2021-11-26 MED FILL — Dexamethasone Sodium Phosphate Inj 100 MG/10ML: INTRAMUSCULAR | Qty: 1 | Status: AC

## 2021-11-29 ENCOUNTER — Encounter (HOSPITAL_COMMUNITY): Payer: Self-pay

## 2021-11-29 ENCOUNTER — Ambulatory Visit
Admission: RE | Admit: 2021-11-29 | Discharge: 2021-11-29 | Disposition: A | Discharge status: Home / Self Care | Payer: Medicare PPO | Source: Ambulatory Visit | Attending: Radiation Oncology | Admitting: Radiation Oncology

## 2021-11-29 ENCOUNTER — Ambulatory Visit: Payer: Medicare PPO | Admitting: Internal Medicine

## 2021-11-29 ENCOUNTER — Inpatient Hospital Stay: Payer: Medicare PPO

## 2021-11-29 ENCOUNTER — Other Ambulatory Visit: Payer: Self-pay

## 2021-11-29 VITALS — BP 135/72 | HR 79 | Temp 97.9°F | Resp 17 | Wt 155.3 lb

## 2021-11-29 DIAGNOSIS — C3431 Malignant neoplasm of lower lobe, right bronchus or lung: Secondary | ICD-10-CM

## 2021-11-29 DIAGNOSIS — R1310 Dysphagia, unspecified: Secondary | ICD-10-CM | POA: Insufficient documentation

## 2021-11-29 DIAGNOSIS — Z5111 Encounter for antineoplastic chemotherapy: Secondary | ICD-10-CM | POA: Insufficient documentation

## 2021-11-29 DIAGNOSIS — I480 Paroxysmal atrial fibrillation: Secondary | ICD-10-CM | POA: Diagnosis not present

## 2021-11-29 DIAGNOSIS — Z7982 Long term (current) use of aspirin: Secondary | ICD-10-CM | POA: Insufficient documentation

## 2021-11-29 DIAGNOSIS — Z902 Acquired absence of lung [part of]: Secondary | ICD-10-CM | POA: Insufficient documentation

## 2021-11-29 DIAGNOSIS — Z853 Personal history of malignant neoplasm of breast: Secondary | ICD-10-CM | POA: Insufficient documentation

## 2021-11-29 DIAGNOSIS — I10 Essential (primary) hypertension: Secondary | ICD-10-CM | POA: Diagnosis not present

## 2021-11-29 DIAGNOSIS — Z79899 Other long term (current) drug therapy: Secondary | ICD-10-CM | POA: Insufficient documentation

## 2021-11-29 DIAGNOSIS — M549 Dorsalgia, unspecified: Secondary | ICD-10-CM | POA: Diagnosis not present

## 2021-11-29 DIAGNOSIS — Z87891 Personal history of nicotine dependence: Secondary | ICD-10-CM | POA: Diagnosis not present

## 2021-11-29 DIAGNOSIS — Z510 Encounter for antineoplastic radiation therapy: Secondary | ICD-10-CM | POA: Diagnosis not present

## 2021-11-29 LAB — CBC WITH DIFFERENTIAL (CANCER CENTER ONLY)
Abs Immature Granulocytes: 0.05 10*3/uL (ref 0.00–0.07)
Basophils Absolute: 0 10*3/uL (ref 0.0–0.1)
Basophils Relative: 0 %
Eosinophils Absolute: 0.1 10*3/uL (ref 0.0–0.5)
Eosinophils Relative: 1 %
HCT: 38.1 % (ref 36.0–46.0)
Hemoglobin: 13.1 g/dL (ref 12.0–15.0)
Immature Granulocytes: 1 %
Lymphocytes Relative: 21 %
Lymphs Abs: 1.4 10*3/uL (ref 0.7–4.0)
MCH: 32.1 pg (ref 26.0–34.0)
MCHC: 34.4 g/dL (ref 30.0–36.0)
MCV: 93.4 fL (ref 80.0–100.0)
Monocytes Absolute: 0.3 10*3/uL (ref 0.1–1.0)
Monocytes Relative: 5 %
Neutro Abs: 4.9 10*3/uL (ref 1.7–7.7)
Neutrophils Relative %: 72 %
Platelet Count: 260 10*3/uL (ref 150–400)
RBC: 4.08 MIL/uL (ref 3.87–5.11)
RDW: 12.7 % (ref 11.5–15.5)
WBC Count: 6.8 10*3/uL (ref 4.0–10.5)
nRBC: 0 % (ref 0.0–0.2)

## 2021-11-29 LAB — CMP (CANCER CENTER ONLY)
ALT: 22 U/L (ref 0–44)
AST: 18 U/L (ref 15–41)
Albumin: 4.4 g/dL (ref 3.5–5.0)
Alkaline Phosphatase: 65 U/L (ref 38–126)
Anion gap: 11 (ref 5–15)
BUN: 47 mg/dL — ABNORMAL HIGH (ref 8–23)
CO2: 25 mmol/L (ref 22–32)
Calcium: 9.2 mg/dL (ref 8.9–10.3)
Chloride: 99 mmol/L (ref 98–111)
Creatinine: 1.67 mg/dL — ABNORMAL HIGH (ref 0.44–1.00)
GFR, Estimated: 31 mL/min — ABNORMAL LOW (ref >60)
Glucose, Bld: 110 mg/dL — ABNORMAL HIGH (ref 70–99)
Potassium: 3.7 mmol/L (ref 3.5–5.1)
Sodium: 135 mmol/L (ref 135–145)
Total Bilirubin: 1.7 mg/dL — ABNORMAL HIGH (ref 0.3–1.2)
Total Protein: 7.2 g/dL (ref 6.5–8.1)

## 2021-11-29 LAB — RAD ONC ARIA SESSION SUMMARY
Course Elapsed Days: 7
Plan Fractions Treated to Date: 6
Plan Prescribed Dose Per Fraction: 2 Gy
Plan Total Fractions Prescribed: 30
Plan Total Prescribed Dose: 60 Gy
Reference Point Dosage Given to Date: 12 Gy
Reference Point Session Dosage Given: 2 Gy
Session Number: 6

## 2021-11-29 MED ORDER — CETIRIZINE HCL 10 MG/ML IV SOLN
10.0000 mg | Freq: Once | INTRAVENOUS | Status: AC
  Administered 2021-11-29: 10 mg via INTRAVENOUS
  Filled 2021-11-29: qty 1

## 2021-11-29 MED ORDER — FAMOTIDINE IN NACL 20-0.9 MG/50ML-% IV SOLN
20.0000 mg | Freq: Once | INTRAVENOUS | Status: AC
  Administered 2021-11-29: 20 mg via INTRAVENOUS
  Filled 2021-11-29: qty 50

## 2021-11-29 MED ORDER — SODIUM CHLORIDE 0.9 % IV SOLN
45.0000 mg/m2 | Freq: Once | INTRAVENOUS | Status: AC
  Administered 2021-11-29: 78 mg via INTRAVENOUS
  Filled 2021-11-29: qty 13

## 2021-11-29 MED ORDER — SODIUM CHLORIDE 0.9 % IV SOLN: Freq: Once | INTRAVENOUS | Status: AC

## 2021-11-29 MED ORDER — SODIUM CHLORIDE 0.9 % IV SOLN
10.0000 mg | Freq: Once | INTRAVENOUS | Status: AC
  Administered 2021-11-29: 10 mg via INTRAVENOUS
  Filled 2021-11-29: qty 10

## 2021-11-29 MED ORDER — SODIUM CHLORIDE 0.9 % IV SOLN
114.0000 mg | Freq: Once | INTRAVENOUS | Status: AC
  Administered 2021-11-29: 110 mg via INTRAVENOUS
  Filled 2021-11-29: qty 11

## 2021-11-29 MED ORDER — PALONOSETRON HCL INJECTION 0.25 MG/5ML
0.2500 mg | Freq: Once | INTRAVENOUS | Status: AC
  Administered 2021-11-29: 0.25 mg via INTRAVENOUS
  Filled 2021-11-29: qty 5

## 2021-11-29 NOTE — Progress Notes (Signed)
Per Dr Julien Nordmann, it is ok to treat pt today with taxol and Carboplatin with creatinine of 1.67. The carboplatin dose should be adjusted based on her creatinine.

## 2021-11-29 NOTE — Progress Notes (Signed)
Okay to treat with total Bili 1.7 per Dr. Julien Nordmann.

## 2021-11-30 ENCOUNTER — Other Ambulatory Visit: Payer: Self-pay

## 2021-11-30 ENCOUNTER — Ambulatory Visit
Admission: RE | Admit: 2021-11-30 | Discharge: 2021-11-30 | Disposition: A | Discharge status: Home / Self Care | Payer: Medicare PPO | Source: Ambulatory Visit | Attending: Radiation Oncology | Admitting: Radiation Oncology

## 2021-11-30 DIAGNOSIS — Z87891 Personal history of nicotine dependence: Secondary | ICD-10-CM | POA: Diagnosis not present

## 2021-11-30 DIAGNOSIS — C3431 Malignant neoplasm of lower lobe, right bronchus or lung: Secondary | ICD-10-CM | POA: Diagnosis not present

## 2021-11-30 DIAGNOSIS — I10 Essential (primary) hypertension: Secondary | ICD-10-CM | POA: Diagnosis not present

## 2021-11-30 DIAGNOSIS — Z902 Acquired absence of lung [part of]: Secondary | ICD-10-CM | POA: Diagnosis not present

## 2021-11-30 DIAGNOSIS — Z853 Personal history of malignant neoplasm of breast: Secondary | ICD-10-CM | POA: Diagnosis not present

## 2021-11-30 DIAGNOSIS — Z5111 Encounter for antineoplastic chemotherapy: Secondary | ICD-10-CM | POA: Diagnosis not present

## 2021-11-30 DIAGNOSIS — I480 Paroxysmal atrial fibrillation: Secondary | ICD-10-CM | POA: Diagnosis not present

## 2021-11-30 DIAGNOSIS — M549 Dorsalgia, unspecified: Secondary | ICD-10-CM | POA: Diagnosis not present

## 2021-11-30 DIAGNOSIS — R1310 Dysphagia, unspecified: Secondary | ICD-10-CM | POA: Diagnosis not present

## 2021-11-30 DIAGNOSIS — Z510 Encounter for antineoplastic radiation therapy: Secondary | ICD-10-CM | POA: Diagnosis not present

## 2021-11-30 LAB — RAD ONC ARIA SESSION SUMMARY
Course Elapsed Days: 8
Plan Fractions Treated to Date: 7
Plan Prescribed Dose Per Fraction: 2 Gy
Plan Total Fractions Prescribed: 30
Plan Total Prescribed Dose: 60 Gy
Reference Point Dosage Given to Date: 14 Gy
Reference Point Session Dosage Given: 2 Gy
Session Number: 7

## 2021-12-01 ENCOUNTER — Ambulatory Visit
Admission: RE | Admit: 2021-12-01 | Discharge: 2021-12-01 | Disposition: A | Discharge status: Home / Self Care | Payer: Medicare PPO | Source: Ambulatory Visit | Attending: Radiation Oncology | Admitting: Radiation Oncology

## 2021-12-01 ENCOUNTER — Other Ambulatory Visit: Payer: Self-pay

## 2021-12-01 DIAGNOSIS — C3431 Malignant neoplasm of lower lobe, right bronchus or lung: Secondary | ICD-10-CM | POA: Diagnosis not present

## 2021-12-01 DIAGNOSIS — M549 Dorsalgia, unspecified: Secondary | ICD-10-CM | POA: Diagnosis not present

## 2021-12-01 DIAGNOSIS — Z87891 Personal history of nicotine dependence: Secondary | ICD-10-CM | POA: Diagnosis not present

## 2021-12-01 DIAGNOSIS — Z902 Acquired absence of lung [part of]: Secondary | ICD-10-CM | POA: Diagnosis not present

## 2021-12-01 DIAGNOSIS — Z5111 Encounter for antineoplastic chemotherapy: Secondary | ICD-10-CM | POA: Diagnosis not present

## 2021-12-01 DIAGNOSIS — I10 Essential (primary) hypertension: Secondary | ICD-10-CM | POA: Diagnosis not present

## 2021-12-01 DIAGNOSIS — Z853 Personal history of malignant neoplasm of breast: Secondary | ICD-10-CM | POA: Diagnosis not present

## 2021-12-01 DIAGNOSIS — I480 Paroxysmal atrial fibrillation: Secondary | ICD-10-CM | POA: Diagnosis not present

## 2021-12-01 DIAGNOSIS — R1310 Dysphagia, unspecified: Secondary | ICD-10-CM | POA: Diagnosis not present

## 2021-12-01 DIAGNOSIS — Z510 Encounter for antineoplastic radiation therapy: Secondary | ICD-10-CM | POA: Diagnosis not present

## 2021-12-01 DIAGNOSIS — M5416 Radiculopathy, lumbar region: Secondary | ICD-10-CM | POA: Diagnosis not present

## 2021-12-01 LAB — RAD ONC ARIA SESSION SUMMARY
Course Elapsed Days: 9
Plan Fractions Treated to Date: 8
Plan Prescribed Dose Per Fraction: 2 Gy
Plan Total Fractions Prescribed: 30
Plan Total Prescribed Dose: 60 Gy
Reference Point Dosage Given to Date: 16 Gy
Reference Point Session Dosage Given: 2 Gy
Session Number: 8

## 2021-12-02 ENCOUNTER — Other Ambulatory Visit: Payer: Self-pay

## 2021-12-02 ENCOUNTER — Ambulatory Visit
Admission: RE | Admit: 2021-12-02 | Discharge: 2021-12-02 | Disposition: A | Discharge status: Home / Self Care | Payer: Medicare PPO | Source: Ambulatory Visit | Attending: Radiation Oncology | Admitting: Radiation Oncology

## 2021-12-02 DIAGNOSIS — Z87891 Personal history of nicotine dependence: Secondary | ICD-10-CM | POA: Diagnosis not present

## 2021-12-02 DIAGNOSIS — C3431 Malignant neoplasm of lower lobe, right bronchus or lung: Secondary | ICD-10-CM | POA: Diagnosis not present

## 2021-12-02 DIAGNOSIS — Z5111 Encounter for antineoplastic chemotherapy: Secondary | ICD-10-CM | POA: Diagnosis not present

## 2021-12-02 DIAGNOSIS — R1310 Dysphagia, unspecified: Secondary | ICD-10-CM | POA: Diagnosis not present

## 2021-12-02 DIAGNOSIS — Z902 Acquired absence of lung [part of]: Secondary | ICD-10-CM | POA: Diagnosis not present

## 2021-12-02 DIAGNOSIS — M549 Dorsalgia, unspecified: Secondary | ICD-10-CM | POA: Diagnosis not present

## 2021-12-02 DIAGNOSIS — I480 Paroxysmal atrial fibrillation: Secondary | ICD-10-CM | POA: Diagnosis not present

## 2021-12-02 DIAGNOSIS — Z853 Personal history of malignant neoplasm of breast: Secondary | ICD-10-CM | POA: Diagnosis not present

## 2021-12-02 DIAGNOSIS — Z510 Encounter for antineoplastic radiation therapy: Secondary | ICD-10-CM | POA: Diagnosis not present

## 2021-12-02 DIAGNOSIS — I10 Essential (primary) hypertension: Secondary | ICD-10-CM | POA: Diagnosis not present

## 2021-12-02 LAB — RAD ONC ARIA SESSION SUMMARY
Course Elapsed Days: 10
Plan Fractions Treated to Date: 9
Plan Prescribed Dose Per Fraction: 2 Gy
Plan Total Fractions Prescribed: 30
Plan Total Prescribed Dose: 60 Gy
Reference Point Dosage Given to Date: 18 Gy
Reference Point Session Dosage Given: 2 Gy
Session Number: 9

## 2021-12-03 ENCOUNTER — Ambulatory Visit
Admission: RE | Admit: 2021-12-03 | Discharge: 2021-12-03 | Disposition: A | Discharge status: Home / Self Care | Payer: Medicare PPO | Source: Ambulatory Visit | Attending: Radiation Oncology | Admitting: Radiation Oncology

## 2021-12-03 ENCOUNTER — Other Ambulatory Visit: Payer: Self-pay

## 2021-12-03 DIAGNOSIS — R1310 Dysphagia, unspecified: Secondary | ICD-10-CM | POA: Diagnosis not present

## 2021-12-03 DIAGNOSIS — C3431 Malignant neoplasm of lower lobe, right bronchus or lung: Secondary | ICD-10-CM | POA: Diagnosis not present

## 2021-12-03 DIAGNOSIS — Z902 Acquired absence of lung [part of]: Secondary | ICD-10-CM | POA: Diagnosis not present

## 2021-12-03 DIAGNOSIS — Z510 Encounter for antineoplastic radiation therapy: Secondary | ICD-10-CM | POA: Diagnosis not present

## 2021-12-03 DIAGNOSIS — Z87891 Personal history of nicotine dependence: Secondary | ICD-10-CM | POA: Diagnosis not present

## 2021-12-03 DIAGNOSIS — I10 Essential (primary) hypertension: Secondary | ICD-10-CM | POA: Diagnosis not present

## 2021-12-03 DIAGNOSIS — I480 Paroxysmal atrial fibrillation: Secondary | ICD-10-CM | POA: Diagnosis not present

## 2021-12-03 DIAGNOSIS — M549 Dorsalgia, unspecified: Secondary | ICD-10-CM | POA: Diagnosis not present

## 2021-12-03 DIAGNOSIS — Z5111 Encounter for antineoplastic chemotherapy: Secondary | ICD-10-CM | POA: Diagnosis not present

## 2021-12-03 DIAGNOSIS — Z853 Personal history of malignant neoplasm of breast: Secondary | ICD-10-CM | POA: Diagnosis not present

## 2021-12-03 LAB — RAD ONC ARIA SESSION SUMMARY
Course Elapsed Days: 11
Plan Fractions Treated to Date: 10
Plan Prescribed Dose Per Fraction: 2 Gy
Plan Total Fractions Prescribed: 30
Plan Total Prescribed Dose: 60 Gy
Reference Point Dosage Given to Date: 20 Gy
Reference Point Session Dosage Given: 2 Gy
Session Number: 10

## 2021-12-03 NOTE — Progress Notes (Unsigned)
[facility] OFFICE PROGRESS NOTE  [name], MD [address] [place] [address]  DIAGNOSIS: Recurrent non-small cell lung cancer presented as a stage IIIa (T1c, N2, M0) non-small cell lung cancer, adenocarcinoma initially diagnosed as T1a (T1c, N0, M0) non-small cell lung cancer, adenocarcinoma presented with right lower lobe lung nodule in June 2020.   PDL1: 90%   Molecular Studies: KRAS G12C  PRIOR THERAPY:  Right video-assisted thoracoscopy, Wedge resection right lower lobe nodule, Thoracoscopic right lower lobectomy, Lymph node dissection on July 26, 2018 under the care of Dr. Roxan Hockey   CURRENT THERAPY: Concurrent chemoradiation with weekly carboplatin for AUC of 2 and paclitaxel 45 Mg/M2.  First dose November 22, 2021.  Status post 2 cycles  INTERVAL HISTORY: Vanessa Day 81 y.o. female returns to the clinic today for a follow-up visit accompanied by her husband.  The patient was recently found to have disease recurrence. Therefore, she is currently undergoing a course of concurrent chemoradiation.  She is feeling fine today without any concerning complaints.  She is just starting to develop mild dysphagia where she feels like food may be getting stuck in the lower esophagus.  Radiation oncology sent her prescription for Carafate today.  He is going to try this.  She has a good appetite and has not lost weight.  Otherwise she has been tolerating treatment well except for some mild fatigue.  Denies any fever, chills, or night sweats. She denies any chest pain, shortness of breath, or hemoptysis.  He previously had a dry cough which has improved since starting treatment.  Denies any nausea or vomiting.  She intermittently has diarrhea or constipation which she attributes to nerves. She had diarrhea a few days ago which improved with imodium.  Has any headache or visual changes.  She has some ongoing issues with low back pain for which she is followed by  orthopedics.  She received a steroid injection without any improvement.  The patient is wondering if she could undergo any surgical intervention while undergoing chemo.  Her molecular studies show that she is positive for K-ras G12 C mutation.  The patient's last day radiation is on 01/06/22.  She also was interested in a Port-A-Cath and an order was placed on 10/30.  It does not appear that this was scheduled at this time.  She reports it often takes multiple attempts before an IV is placed.  She is here today for evaluation repeat blood work before undergoing cycle #3.   MEDICAL HISTORY: Past Medical History:  Diagnosis Date   Adenocarcinoma of lung, stage 1, right ([facility]) 2020   Anxiety    Arthritis    back, feet, jaw - per patient "all over"   Breast cancer ([facility]) 2000   Right breast   Family history of breast cancer    GERD (gastroesophageal reflux disease)    Graves disease    H/O hiatal hernia    Hyperlipidemia    Hypertension    IBS (irritable bowel syndrome)    Mitral regurgitation    moderate mitral regurgitation 08/20/20 echo   Neck pain    PAF (paroxysmal atrial fibrillation) ([facility])    ~ 09/2020 in setting of hyperthyroidism   PONV (postoperative nausea and vomiting)    Pre-diabetes     ALLERGIES:  is allergic to hydrocodone, codeine, dicyclomine hcl, hyoscyamine, nitrofurantoin, statins, sudafed [pseudoephedrine], oxycodone-acetaminophen, and sulfa antibiotics.  MEDICATIONS:  Current Outpatient Medications  Medication Sig Dispense Refill   acetaminophen (TYLENOL)  500 MG tablet Take 1 tablet (500 mg total) by mouth every 6 (six) hours as needed for mild pain. (Patient taking differently: Take 1,000 mg by mouth every 6 (six) hours as needed for mild pain.) 30 tablet 0   alosetron (LOTRONEX) 0.5 MG tablet Take 0.25 mg by mouth daily as needed (Diarrhea).     AMBULATORY NON FORMULARY MEDICATION Take 4 capsules by mouth daily. Medication Name: SciatiEase- Sciatic Nerve Formula.      aspirin EC 81 MG tablet Take 1 tablet (81 mg total) by mouth daily. Swallow whole. 90 tablet 3   azelastine (ASTELIN) 0.1 % nasal spray Place 1 spray into both nostrils daily as needed (Congestion). Use in each nostril as directed     celecoxib (CELEBREX) 200 MG capsule Take 200 mg by mouth 2 (two) times daily.     cetirizine (ZYRTEC) 10 MG tablet Take 10 mg by mouth daily as needed for allergies.     ezetimibe (ZETIA) 10 MG tablet Take 10 mg by mouth daily.     hydrochlorothiazide (MICROZIDE) 12.5 MG capsule Take 1 capsule (12.5 mg total) by mouth daily. 45 capsule 3   lidocaine-prilocaine (EMLA) cream Apply 1 Application topically as needed. 30 g 2   LORazepam (ATIVAN) 1 MG tablet Take 1-2 mg by mouth at bedtime as needed for sleep.     losartan (COZAAR) 100 MG tablet Take 100 mg by mouth daily.     methimazole (TAPAZOLE) 5 MG tablet Take 5 mg by mouth daily.     Polyethyl Glycol-Propyl Glycol (SYSTANE OP) Place 1 drop into both eyes 2 (two) times daily as needed (dry eyes).     prochlorperazine (COMPAZINE) 10 MG tablet Take 1 tablet (10 mg total) by mouth every 6 (six) hours as needed for nausea or vomiting. 30 tablet 0   sertraline (ZOLOFT) 50 MG tablet Take 50 mg by mouth at bedtime.     No current facility-administered medications for this visit.    SURGICAL HISTORY:  Past Surgical History:  Procedure Laterality Date   ABDOMINAL HYSTERECTOMY     APPENDECTOMY     BACK SURGERY     BREAST BIOPSY     BREAST SURGERY     CARPOMETACARPEL SUSPENSION PLASTY Left 08/25/2015   Procedure: SUSPENSIONPLASTY LEFT THUMB TRAPEZIUM EXCISION ABDUCTOR POLLICIS LONGUS TRANSFER;  Surgeon: Daryll Brod, MD;  Location: [facility];  Service: Orthopedics;  Laterality: Left;   CHOLECYSTECTOMY     COLONOSCOPY     several   EYE SURGERY     cataract right and left eye   GAS INSERTION  06/23/2011   Procedure: INSERTION OF GAS;  Surgeon: Hayden Pedro, MD;  Location: [facility];  Service:  Ophthalmology;  Laterality: Left;  SF6   LOBECTOMY Right 07/26/2018   Procedure: RIGHT LOWER LOBE LOBECTOMY;  Surgeon: Melrose Nakayama, MD;  Location: [facility];  Service: Thoracic;  Laterality: Right;   MASTECTOMY     right breast   SCLERAL BUCKLE  06/23/2011   Procedure: SCLERAL BUCKLE;  Surgeon: Hayden Pedro, MD;  Location: [facility];  Service: Ophthalmology;  Laterality: Left;   SEGMENTECOMY Right 07/26/2018   Procedure: SEGMENTECTOMY;  Surgeon: Melrose Nakayama, MD;  Location: [facility];  Service: Thoracic;  Laterality: Right;   TONSILLECTOMY     VIDEO ASSISTED THORACOSCOPY Right 07/26/2018   Procedure: VIDEO ASSISTED THORACOSCOPY;  Surgeon: Melrose Nakayama, MD;  Location: [facility] OR;  Service: Thoracic;  Laterality: Right;   VIDEO BRONCHOSCOPY WITH  ENDOBRONCHIAL ULTRASOUND N/A 10/27/2021   Procedure: VIDEO BRONCHOSCOPY WITH ENDOBRONCHIAL ULTRASOUND;  Surgeon: Melrose Nakayama, MD;  Location: [facility] OR;  Service: Thoracic;  Laterality: N/A;    REVIEW OF SYSTEMS:   Review of Systems  Constitutional: Positive for mild fatigue. Negative for appetite change, chills, fever and unexpected weight change.  HENT:   Negative for mouth sores, nosebleeds, sore throat and trouble swallowing.   Eyes: Negative for eye problems and icterus.  Respiratory: Negative for cough, hemoptysis, shortness of breath and wheezing.   Cardiovascular: Negative for chest pain and leg swelling.  Gastrointestinal: Positive for intermittent diarrhea or constipation.  Negative for abdominal pain, nausea and vomiting.  Genitourinary: Negative for bladder incontinence, difficulty urinating, dysuria, frequency and hematuria.   Musculoskeletal: Negative for back pain, gait problem, neck pain and neck stiffness.  Skin: Negative for itching and rash.  Neurological: Positive for back pain.  Negative for dizziness, extremity weakness, gait problem, headaches, light-headedness and seizures.  Hematological: Negative for  adenopathy. Does not bruise/bleed easily.  Psychiatric/Behavioral: Negative for confusion, depression and sleep disturbance. The patient is not nervous/anxious.     PHYSICAL EXAMINATION:  There were no vitals taken for this visit.  ECOG PERFORMANCE STATUS: 1  Physical Exam  Constitutional: Oriented to person, place, and time and well-developed, well-nourished, and in no distress. No distress.  HENT:  Head: Normocephalic and atraumatic.  Mouth/Throat: Oropharynx is clear and moist. No oropharyngeal exudate.  Eyes: Conjunctivae are normal. Right eye exhibits no discharge. Left eye exhibits no discharge. No scleral icterus.  Neck: Normal range of motion. Neck supple.  Cardiovascular: Normal rate, regular rhythm, normal heart sounds and intact distal pulses.   Pulmonary/Chest: Effort normal and breath sounds normal. No respiratory distress. No wheezes. No rales.  Abdominal: Soft. Bowel sounds are normal. Exhibits no distension and no mass. There is no tenderness.  Musculoskeletal: Normal range of motion. Exhibits no edema.  Lymphadenopathy:    No cervical adenopathy.  Neurological: Alert and oriented to person, place, and time. Exhibits normal muscle tone. Gait normal. Coordination normal.  Skin: Skin is warm and dry. No rash noted. Not diaphoretic. No erythema. No pallor.  Psychiatric: Mood, memory and judgment normal.  Vitals reviewed.  LABORATORY DATA: Lab Results  Component Value Date   WBC 6.8 11/29/2021   HGB 13.1 11/29/2021   HCT 38.1 11/29/2021   MCV 93.4 11/29/2021   PLT 260 11/29/2021      Chemistry      Component Value Date/Time   NA 135 11/29/2021 1013   K 3.7 11/29/2021 1013   CL 99 11/29/2021 1013   CO2 25 11/29/2021 1013   BUN 47 (H) 11/29/2021 1013   CREATININE 1.67 (H) 11/29/2021 1013      Component Value Date/Time   CALCIUM 9.2 11/29/2021 1013   ALKPHOS 65 11/29/2021 1013   AST 18 11/29/2021 1013   ALT 22 11/29/2021 1013   BILITOT 1.7 (H) 11/29/2021  1013       RADIOGRAPHIC STUDIES:  MR BRAIN W WO CONTRAST  Result Date: 11/19/2021 CLINICAL DATA:  Provided history: Malignant neoplasm of unspecified part of unspecified bronchus or lung. Non-small cell lung cancer, staging. EXAM: MRI HEAD WITHOUT AND WITH CONTRAST TECHNIQUE: Multiplanar, multiecho pulse sequences of the brain and surrounding structures were obtained without and with intravenous contrast. CONTRAST:  60m GADAVIST GADOBUTROL 1 MMOL/ML IV SOLN COMPARISON:  PET-CT 10/01/2021. FINDINGS: Brain: Mild generalized parenchymal atrophy. Mild for age multifocal T2 FLAIR hyperintense signal abnormality within the cerebral  white matter and pons, nonspecific but compatible with chronic small vessel ischemic disease. Small T2 hyperintense foci within the bilateral deep gray nuclei, likely reflecting a combination of prominent perivascular spaces and chronic lacunar infarcts. Small chronic infarct within the right cerebellar hemisphere. There is no acute infarct. No evidence of an intracranial mass. No chronic intracranial blood products. No extra-axial fluid collection. No midline shift. No pathologic intracranial enhancement identified. Vascular: Maintained flow voids within the proximal large arterial vessels. 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 Skull and upper cervical spine: No focal suspicious marrow lesion. Sinuses/Orbits: No mass or acute finding within the imaged orbits. Left scleral buckle. Prior bilateral ocular lens replacement. Small mucous retention cysts within the right sphenoid sinus. Mild mucosal thickening within left ethmoid air cells. Other: Trace fluid within the right mastoid air cells. IMPRESSION: No evidence of intracranial metastatic disease. Parenchymal atrophy and chronic small vessel ischemic disease, as described. Small chronic infarct within the right cerebellar hemisphere. Electronically Signed   By: Kellie Simmering D.O.   On: 11/19/2021 07:47   DG BONE DENSITY (DXA)  Result Date: 11/04/2021 EXAM: DUAL X-RAY ABSORPTIOMETRY (DXA) FOR BONE MINERAL DENSITY IMPRESSION: Referring Physician:  [name] Your patient completed a bone mineral density test using GE Lunar iDXA system (analysis version: 16). Technologist: EDH PATIENT: Name: Dorothia, Passmore Patient ID: [id number] Birth Date: 1940/02/27 Height: 62.0 in. Sex: Female Measured: 11/04/2021 Weight: 154.2 lbs. Indications: Advanced Age, Bilateral Ovariectomy (65.51), Breast Cancer History, Caucasian, Estrogen Deficient, History of Fracture (Adult) (V15.51), Hyperthyroid (242.9), Hysterectomy, Lung Cancer, Methimazole, Postmenopausal Fractures: Calcaneus Treatments: ASSESSMENT: The BMD measured at Forearm Radius 33% is 0.713 g/cm2 with a T-score of -1.9. This patient is considered osteopenic/low bone mass according to [facility]) criteria. The quality of the exam is good. The lumbar spine was excluded due to being excluded on prior exam. Site Region Measured Date Measured Age YA BMD Significant CHANGE T-score Left Forearm Radius 33% 11/04/2021 81.9 -1.9 0.713 g/cm2 Left Forearm Radius 33% 04/29/2019 79.4 -2.0 0.711 g/cm2 DualFemur Neck Left 11/04/2021 81.9 -1.6 0.814 g/cm2 DualFemur Neck Left 04/29/2019 79.4 -1.7 0.805 g/cm2 DualFemur Total Mean 11/04/2021 81.9 -0.3 0.967 g/cm2 * DualFemur Total Mean 04/29/2019 79.4 -0.6 0.928 g/cm2 World Health Organization [facility]) criteria for post-menopausal, Caucasian Women: Normal       T-score at or above -1 SD Osteopenia   T-score between -1 and -2.5 SD Osteoporosis T-score at or below -2.5 SD RECOMMENDATION: 1. All patients should optimize calcium and vitamin D intake. 2. Consider FDA-approved medical therapies in postmenopausal women and men aged 24 years and older, based on the following: a. A hip or vertebral (clinical or morphometric) fracture. b. T-score = -2.5 at the femoral neck or spine after  appropriate evaluation to exclude secondary causes. c. Low bone mass (T-score between -1.0 and -2.5 at the femoral neck or spine) and a 10-year probability of a hip fracture = 3% or a 10-year probability of a major osteoporosis-related fracture = 20% based on the [place]-adapted WHO algorithm. d. Clinician judgment and/or patient preferences may indicate treatment for people with 10-year fracture probabilities above or below these levels. FOLLOW-UP: Patients with diagnosis of osteoporosis or at high risk for fracture should have regular bone mineral density tests. Patients eligible for Medicare are allowed routine testing every 2 years. The testing frequency can be increased to one year for patients who have rapidly progressing disease, are receiving or discontinuing medical therapy  to restore bone mass, or have additional risk factors. I have reviewed this study and agree with the findings. [facility] FRAX* 10-year Probability of Fracture Based on femoral neck BMD: DualFemur (Left) Major Osteoporotic Fracture: 20.0% Hip Fracture:                4.8% Population:                  [place] (Caucasian) Risk Factors:                History of Fracture (Adult) (V15.51) *FRAX is a Materials engineer of the [employer] for Metabolic Bone Disease, a [place] (WHO) [facility]. ASSESSMENT: The probability of a major osteoporotic fracture is 20.0% within the next ten years. The probability of a hip fracture is 4.8% within the next ten years. Electronically Signed   By: Ammie Ferrier M.D.   On: 11/04/2021 08:21     ASSESSMENT/PLAN:  This is a very pleasant 81 year old Caucasian female diagnosed with stage IIIa (T1c, N2, M0) non-small cell lung cancer, adenocarcinoma.  The patient was initially diagnosed as a stage Ia (T1c, N0, M0) non-small cell lung cancer, adenocarcinoma.  The patient is status post a right lower lobectomy with lymph node dissection July  2020.  She had disease recurrence in October 2023.  Her PD-L1 expression is 90%.  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   The patient is currently undergoing concurrent chemoradiation with carboplatin for an AUC of 2 and paclitaxel 45 mg per metered squared.  Status post 2 cycles.  Her last day radiation is tentatively scheduled for 12/14 this treatment will be followed by consolidation immunotherapy if she has no evidence of disease question after the induction phase  Labs were reviewed.  Recommend that she proceed with cycle #3 today scheduled.  We will see her back for follow-up visit in 2 weeks for evaluation repeat blood work before starting cycle #5.  We have called interventional radiology and they have a cancellation on 11/21.  The patient is scheduled for chemo that day.  We will see if we could move her chemotherapy to 11/20.  May not be feasible here due to availability from the upcoming holiday.  The patient may be open to going to one of her other locations if absolutely necessary.   Carafate was sent to her pharmacy by radiation oncology today regarding her mild dysphagia.  We discussed the importance of good nutrition while undergoing treatment.  Encouraged high-protein high-calorie meals.  I would recommend managing her back pain conservatively at this point in time and to complete the 6-week course of chemoradiation without any interruptions.  Concerned that chemo would cause delayed wound healing and infection risk.  We will continue to follow with orthopedics.  The patient was advised to call immediately if she has any concerning symptoms in the interval. The patient voices understanding of current disease status and treatment options and is in agreement with the current care plan. All questions were answered. The  patient knows to call the clinic with any problems, questions or concerns. We can certainly see the patient much sooner if necessary     No orders of the defined types were placed in this encounter.    The total time spent in the appointment  was 20-29 minutes.   Deaire Mcwhirter L Snyder Colavito, PA-C 12/03/21

## 2021-12-06 ENCOUNTER — Other Ambulatory Visit: Payer: Self-pay

## 2021-12-06 ENCOUNTER — Ambulatory Visit
Admission: RE | Admit: 2021-12-06 | Discharge: 2021-12-06 | Disposition: A | Discharge status: Home / Self Care | Payer: Medicare PPO | Source: Ambulatory Visit | Attending: Radiation Oncology | Admitting: Radiation Oncology

## 2021-12-06 DIAGNOSIS — Z853 Personal history of malignant neoplasm of breast: Secondary | ICD-10-CM | POA: Diagnosis not present

## 2021-12-06 DIAGNOSIS — Z5111 Encounter for antineoplastic chemotherapy: Secondary | ICD-10-CM | POA: Diagnosis not present

## 2021-12-06 DIAGNOSIS — Z902 Acquired absence of lung [part of]: Secondary | ICD-10-CM | POA: Diagnosis not present

## 2021-12-06 DIAGNOSIS — Z510 Encounter for antineoplastic radiation therapy: Secondary | ICD-10-CM | POA: Diagnosis not present

## 2021-12-06 DIAGNOSIS — I480 Paroxysmal atrial fibrillation: Secondary | ICD-10-CM | POA: Diagnosis not present

## 2021-12-06 DIAGNOSIS — Z87891 Personal history of nicotine dependence: Secondary | ICD-10-CM | POA: Diagnosis not present

## 2021-12-06 DIAGNOSIS — C3431 Malignant neoplasm of lower lobe, right bronchus or lung: Secondary | ICD-10-CM | POA: Diagnosis not present

## 2021-12-06 DIAGNOSIS — R1310 Dysphagia, unspecified: Secondary | ICD-10-CM | POA: Diagnosis not present

## 2021-12-06 DIAGNOSIS — I10 Essential (primary) hypertension: Secondary | ICD-10-CM | POA: Diagnosis not present

## 2021-12-06 DIAGNOSIS — M549 Dorsalgia, unspecified: Secondary | ICD-10-CM | POA: Diagnosis not present

## 2021-12-06 LAB — RAD ONC ARIA SESSION SUMMARY
Course Elapsed Days: 14
Plan Fractions Treated to Date: 11
Plan Prescribed Dose Per Fraction: 2 Gy
Plan Total Fractions Prescribed: 30
Plan Total Prescribed Dose: 60 Gy
Reference Point Dosage Given to Date: 22 Gy
Reference Point Session Dosage Given: 2 Gy
Session Number: 11

## 2021-12-06 MED FILL — Dexamethasone Sodium Phosphate Inj 100 MG/10ML: INTRAMUSCULAR | Qty: 1 | Status: AC

## 2021-12-07 ENCOUNTER — Inpatient Hospital Stay: Payer: Medicare PPO

## 2021-12-07 ENCOUNTER — Ambulatory Visit
Admission: RE | Admit: 2021-12-07 | Discharge: 2021-12-07 | Disposition: A | Discharge status: Home / Self Care | Payer: Medicare PPO | Source: Ambulatory Visit | Attending: Radiation Oncology | Admitting: Radiation Oncology

## 2021-12-07 ENCOUNTER — Inpatient Hospital Stay (HOSPITAL_BASED_OUTPATIENT_CLINIC_OR_DEPARTMENT_OTHER): Payer: Medicare PPO | Admitting: Physician Assistant

## 2021-12-07 ENCOUNTER — Other Ambulatory Visit: Payer: Self-pay | Admitting: Radiation Oncology

## 2021-12-07 ENCOUNTER — Other Ambulatory Visit: Payer: Self-pay

## 2021-12-07 VITALS — BP 128/77 | HR 80 | Resp 16

## 2021-12-07 VITALS — BP 108/74 | HR 99 | Temp 97.9°F | Resp 16 | Wt 154.3 lb

## 2021-12-07 DIAGNOSIS — Z853 Personal history of malignant neoplasm of breast: Secondary | ICD-10-CM | POA: Diagnosis not present

## 2021-12-07 DIAGNOSIS — Z87891 Personal history of nicotine dependence: Secondary | ICD-10-CM | POA: Diagnosis not present

## 2021-12-07 DIAGNOSIS — I10 Essential (primary) hypertension: Secondary | ICD-10-CM | POA: Diagnosis not present

## 2021-12-07 DIAGNOSIS — Z510 Encounter for antineoplastic radiation therapy: Secondary | ICD-10-CM | POA: Diagnosis not present

## 2021-12-07 DIAGNOSIS — C3431 Malignant neoplasm of lower lobe, right bronchus or lung: Secondary | ICD-10-CM

## 2021-12-07 DIAGNOSIS — Z5111 Encounter for antineoplastic chemotherapy: Secondary | ICD-10-CM

## 2021-12-07 DIAGNOSIS — Z902 Acquired absence of lung [part of]: Secondary | ICD-10-CM | POA: Diagnosis not present

## 2021-12-07 DIAGNOSIS — R1310 Dysphagia, unspecified: Secondary | ICD-10-CM | POA: Diagnosis not present

## 2021-12-07 DIAGNOSIS — I480 Paroxysmal atrial fibrillation: Secondary | ICD-10-CM | POA: Diagnosis not present

## 2021-12-07 DIAGNOSIS — M549 Dorsalgia, unspecified: Secondary | ICD-10-CM | POA: Diagnosis not present

## 2021-12-07 LAB — CBC WITH DIFFERENTIAL (CANCER CENTER ONLY)
Abs Immature Granulocytes: 0.01 10*3/uL (ref 0.00–0.07)
Basophils Absolute: 0 10*3/uL (ref 0.0–0.1)
Basophils Relative: 1 %
Eosinophils Absolute: 0.1 10*3/uL (ref 0.0–0.5)
Eosinophils Relative: 1 %
HCT: 36.4 % (ref 36.0–46.0)
Hemoglobin: 12.4 g/dL (ref 12.0–15.0)
Immature Granulocytes: 0 %
Lymphocytes Relative: 21 %
Lymphs Abs: 1.1 10*3/uL (ref 0.7–4.0)
MCH: 32.2 pg (ref 26.0–34.0)
MCHC: 34.1 g/dL (ref 30.0–36.0)
MCV: 94.5 fL (ref 80.0–100.0)
Monocytes Absolute: 0.4 10*3/uL (ref 0.1–1.0)
Monocytes Relative: 8 %
Neutro Abs: 3.6 10*3/uL (ref 1.7–7.7)
Neutrophils Relative %: 69 %
Platelet Count: 246 10*3/uL (ref 150–400)
RBC: 3.85 MIL/uL — ABNORMAL LOW (ref 3.87–5.11)
RDW: 13.2 % (ref 11.5–15.5)
WBC Count: 5.1 10*3/uL (ref 4.0–10.5)
nRBC: 0 % (ref 0.0–0.2)

## 2021-12-07 LAB — RAD ONC ARIA SESSION SUMMARY
Course Elapsed Days: 15
Plan Fractions Treated to Date: 12
Plan Prescribed Dose Per Fraction: 2 Gy
Plan Total Fractions Prescribed: 30
Plan Total Prescribed Dose: 60 Gy
Reference Point Dosage Given to Date: 24 Gy
Reference Point Session Dosage Given: 2 Gy
Session Number: 12

## 2021-12-07 LAB — CMP (CANCER CENTER ONLY)
ALT: 15 U/L (ref 0–44)
AST: 12 U/L — ABNORMAL LOW (ref 15–41)
Albumin: 4.2 g/dL (ref 3.5–5.0)
Alkaline Phosphatase: 65 U/L (ref 38–126)
Anion gap: 8 (ref 5–15)
BUN: 34 mg/dL — ABNORMAL HIGH (ref 8–23)
CO2: 29 mmol/L (ref 22–32)
Calcium: 8.8 mg/dL — ABNORMAL LOW (ref 8.9–10.3)
Chloride: 102 mmol/L (ref 98–111)
Creatinine: 1.35 mg/dL — ABNORMAL HIGH (ref 0.44–1.00)
GFR, Estimated: 39 mL/min — ABNORMAL LOW (ref >60)
Glucose, Bld: 152 mg/dL — ABNORMAL HIGH (ref 70–99)
Potassium: 4 mmol/L (ref 3.5–5.1)
Sodium: 139 mmol/L (ref 135–145)
Total Bilirubin: 0.9 mg/dL (ref 0.3–1.2)
Total Protein: 6.8 g/dL (ref 6.5–8.1)

## 2021-12-07 MED ORDER — CETIRIZINE HCL 10 MG/ML IV SOLN
10.0000 mg | Freq: Once | INTRAVENOUS | Status: AC
  Administered 2021-12-07: 10 mg via INTRAVENOUS
  Filled 2021-12-07: qty 1

## 2021-12-07 MED ORDER — SODIUM CHLORIDE 0.9 % IV SOLN
114.0000 mg | Freq: Once | INTRAVENOUS | Status: AC
  Administered 2021-12-07: 110 mg via INTRAVENOUS
  Filled 2021-12-07: qty 11

## 2021-12-07 MED ORDER — LIDOCAINE-PRILOCAINE 2.5-2.5 % EX CREA: 1.0000 | TOPICAL_CREAM | CUTANEOUS | 2 refills | Status: AC | PRN

## 2021-12-07 MED ORDER — SODIUM CHLORIDE 0.9 % IV SOLN
45.0000 mg/m2 | Freq: Once | INTRAVENOUS | Status: AC
  Administered 2021-12-07: 78 mg via INTRAVENOUS
  Filled 2021-12-07: qty 13

## 2021-12-07 MED ORDER — SODIUM CHLORIDE 0.9 % IV SOLN
10.0000 mg | Freq: Once | INTRAVENOUS | Status: AC
  Administered 2021-12-07: 10 mg via INTRAVENOUS
  Filled 2021-12-07: qty 10

## 2021-12-07 MED ORDER — SODIUM CHLORIDE 0.9 % IV SOLN: Freq: Once | INTRAVENOUS | Status: AC

## 2021-12-07 MED ORDER — FAMOTIDINE IN NACL 20-0.9 MG/50ML-% IV SOLN
20.0000 mg | Freq: Once | INTRAVENOUS | Status: AC
  Administered 2021-12-07: 20 mg via INTRAVENOUS
  Filled 2021-12-07: qty 50

## 2021-12-07 MED ORDER — SUCRALFATE 1 G PO TABS: 1.0000 g | ORAL_TABLET | Freq: Three times a day (TID) | ORAL | 2 refills | Status: DC

## 2021-12-07 MED ORDER — PALONOSETRON HCL INJECTION 0.25 MG/5ML
0.2500 mg | Freq: Once | INTRAVENOUS | Status: AC
  Administered 2021-12-07: 0.25 mg via INTRAVENOUS
  Filled 2021-12-07: qty 5

## 2021-12-07 NOTE — Patient Instructions (Signed)
[facility] ONCOL[facility]  Discharge Instructions: Thank you for choosing [facility] to provide your oncology and hematology care.   If you have a lab appointment with the [facility], please go directly to the [facility] and check in at the registration area.   Wear comfortable clothing and clothing appropriate for easy access to any Portacath or PICC line.   We strive to give you quality time with your provider. You may need to reschedule your appointment if you arrive late (15 or more minutes).  Arriving late affects you and other patients whose appointments are after yours.  Also, if you miss three or more appointments without notifying the office, you may be dismissed from the clinic at the provider's discretion.      For prescription refill requests, have your pharmacy contact our office and allow 72 hours for refills to be completed.    Today you received the following chemotherapy and/or immunotherapy agents: Taxol/Carboplatin      To help prevent nausea and vomiting after your treatment, we encourage you to take your nausea medication as directed.  BELOW ARE SYMPTOMS THAT SHOULD BE REPORTED IMMEDIATELY: *FEVER GREATER THAN 100.4 F (38 C) OR HIGHER *CHILLS OR SWEATING *NAUSEA AND VOMITING THAT IS NOT CONTROLLED WITH YOUR NAUSEA MEDICATION *UNUSUAL SHORTNESS OF BREATH *UNUSUAL BRUISING OR BLEEDING *URINARY PROBLEMS (pain or burning when urinating, or frequent urination) *BOWEL PROBLEMS (unusual diarrhea, constipation, pain near the anus) TENDERNESS IN MOUTH AND THROAT WITH OR WITHOUT PRESENCE OF ULCERS (sore throat, sores in mouth, or a toothache) UNUSUAL RASH, SWELLING OR PAIN  UNUSUAL VAGINAL DISCHARGE OR ITCHING   Items with * indicate a potential emergency and should be followed up as soon as possible or go to the Emergency Department if any problems should occur.  Please show the CHEMOTHERAPY ALERT CARD or IMMUNOTHERAPY ALERT CARD at  check-in to the Emergency Department and triage nurse.  Should you have questions after your visit or need to cancel or reschedule your appointment, please contact [facility]  Dept: [phone]  and follow the prompts.  Office hours are 8:00 a.m. to 4:30 p.m. Monday - Friday. Please note that voicemails left after 4:00 p.m. may not be returned until the following business day.  We are closed weekends and major holidays. You have access to a nurse at all times for urgent questions. Please call the main number to the clinic Dept: [phone] and follow the prompts.   For any non-urgent questions, you may also contact your provider using MyChart. We now offer e-Visits for anyone 18 and older to request care online for non-urgent symptoms. For details visit [email or web address].   Also download the MyChart app! Go to the app store, search "MyChart", open the app, select [facility], and log in with your MyChart username and password.  Masks are optional in the cancer centers. If you would like for your care team to wear a mask while they are taking care of you, please let them know. You may have one support person who is at least 82 years old accompany you for your appointments.

## 2021-12-08 ENCOUNTER — Ambulatory Visit
Admission: RE | Admit: 2021-12-08 | Discharge: 2021-12-08 | Disposition: A | Discharge status: Home / Self Care | Payer: Medicare PPO | Source: Ambulatory Visit | Attending: Radiation Oncology | Admitting: Radiation Oncology

## 2021-12-08 ENCOUNTER — Other Ambulatory Visit: Payer: Self-pay

## 2021-12-08 DIAGNOSIS — I480 Paroxysmal atrial fibrillation: Secondary | ICD-10-CM | POA: Diagnosis not present

## 2021-12-08 DIAGNOSIS — C3431 Malignant neoplasm of lower lobe, right bronchus or lung: Secondary | ICD-10-CM | POA: Diagnosis not present

## 2021-12-08 DIAGNOSIS — Z87891 Personal history of nicotine dependence: Secondary | ICD-10-CM | POA: Diagnosis not present

## 2021-12-08 DIAGNOSIS — Z853 Personal history of malignant neoplasm of breast: Secondary | ICD-10-CM | POA: Diagnosis not present

## 2021-12-08 DIAGNOSIS — M549 Dorsalgia, unspecified: Secondary | ICD-10-CM | POA: Diagnosis not present

## 2021-12-08 DIAGNOSIS — Z902 Acquired absence of lung [part of]: Secondary | ICD-10-CM | POA: Diagnosis not present

## 2021-12-08 DIAGNOSIS — I10 Essential (primary) hypertension: Secondary | ICD-10-CM | POA: Diagnosis not present

## 2021-12-08 DIAGNOSIS — Z510 Encounter for antineoplastic radiation therapy: Secondary | ICD-10-CM | POA: Diagnosis not present

## 2021-12-08 DIAGNOSIS — Z5111 Encounter for antineoplastic chemotherapy: Secondary | ICD-10-CM | POA: Diagnosis not present

## 2021-12-08 DIAGNOSIS — R1310 Dysphagia, unspecified: Secondary | ICD-10-CM | POA: Diagnosis not present

## 2021-12-08 LAB — RAD ONC ARIA SESSION SUMMARY
Course Elapsed Days: 16
Plan Fractions Treated to Date: 13
Plan Prescribed Dose Per Fraction: 2 Gy
Plan Total Fractions Prescribed: 30
Plan Total Prescribed Dose: 60 Gy
Reference Point Dosage Given to Date: 26 Gy
Reference Point Session Dosage Given: 2 Gy
Session Number: 13

## 2021-12-09 ENCOUNTER — Other Ambulatory Visit: Payer: Self-pay

## 2021-12-09 ENCOUNTER — Ambulatory Visit
Admission: RE | Admit: 2021-12-09 | Discharge: 2021-12-09 | Disposition: A | Discharge status: Home / Self Care | Payer: Medicare PPO | Source: Ambulatory Visit | Attending: Radiation Oncology | Admitting: Radiation Oncology

## 2021-12-09 DIAGNOSIS — M549 Dorsalgia, unspecified: Secondary | ICD-10-CM | POA: Diagnosis not present

## 2021-12-09 DIAGNOSIS — Z5111 Encounter for antineoplastic chemotherapy: Secondary | ICD-10-CM | POA: Diagnosis not present

## 2021-12-09 DIAGNOSIS — Z87891 Personal history of nicotine dependence: Secondary | ICD-10-CM | POA: Diagnosis not present

## 2021-12-09 DIAGNOSIS — I480 Paroxysmal atrial fibrillation: Secondary | ICD-10-CM | POA: Diagnosis not present

## 2021-12-09 DIAGNOSIS — Z853 Personal history of malignant neoplasm of breast: Secondary | ICD-10-CM | POA: Diagnosis not present

## 2021-12-09 DIAGNOSIS — Z510 Encounter for antineoplastic radiation therapy: Secondary | ICD-10-CM | POA: Diagnosis not present

## 2021-12-09 DIAGNOSIS — R1310 Dysphagia, unspecified: Secondary | ICD-10-CM | POA: Diagnosis not present

## 2021-12-09 DIAGNOSIS — Z902 Acquired absence of lung [part of]: Secondary | ICD-10-CM | POA: Diagnosis not present

## 2021-12-09 DIAGNOSIS — C3431 Malignant neoplasm of lower lobe, right bronchus or lung: Secondary | ICD-10-CM | POA: Diagnosis not present

## 2021-12-09 DIAGNOSIS — I10 Essential (primary) hypertension: Secondary | ICD-10-CM | POA: Diagnosis not present

## 2021-12-09 LAB — RAD ONC ARIA SESSION SUMMARY
Course Elapsed Days: 17
Plan Fractions Treated to Date: 14
Plan Prescribed Dose Per Fraction: 2 Gy
Plan Total Fractions Prescribed: 30
Plan Total Prescribed Dose: 60 Gy
Reference Point Dosage Given to Date: 28 Gy
Reference Point Session Dosage Given: 2 Gy
Session Number: 14

## 2021-12-10 ENCOUNTER — Ambulatory Visit
Admission: RE | Admit: 2021-12-10 | Discharge: 2021-12-10 | Disposition: A | Discharge status: Home / Self Care | Payer: Medicare PPO | Source: Ambulatory Visit | Attending: Radiation Oncology | Admitting: Radiation Oncology

## 2021-12-10 ENCOUNTER — Other Ambulatory Visit: Payer: Self-pay

## 2021-12-10 DIAGNOSIS — M549 Dorsalgia, unspecified: Secondary | ICD-10-CM | POA: Diagnosis not present

## 2021-12-10 DIAGNOSIS — Z5111 Encounter for antineoplastic chemotherapy: Secondary | ICD-10-CM | POA: Diagnosis not present

## 2021-12-10 DIAGNOSIS — Z853 Personal history of malignant neoplasm of breast: Secondary | ICD-10-CM | POA: Diagnosis not present

## 2021-12-10 DIAGNOSIS — Z87891 Personal history of nicotine dependence: Secondary | ICD-10-CM | POA: Diagnosis not present

## 2021-12-10 DIAGNOSIS — C3431 Malignant neoplasm of lower lobe, right bronchus or lung: Secondary | ICD-10-CM | POA: Diagnosis not present

## 2021-12-10 DIAGNOSIS — I10 Essential (primary) hypertension: Secondary | ICD-10-CM | POA: Diagnosis not present

## 2021-12-10 DIAGNOSIS — R1310 Dysphagia, unspecified: Secondary | ICD-10-CM | POA: Diagnosis not present

## 2021-12-10 DIAGNOSIS — Z510 Encounter for antineoplastic radiation therapy: Secondary | ICD-10-CM | POA: Diagnosis not present

## 2021-12-10 DIAGNOSIS — Z902 Acquired absence of lung [part of]: Secondary | ICD-10-CM | POA: Diagnosis not present

## 2021-12-10 DIAGNOSIS — I480 Paroxysmal atrial fibrillation: Secondary | ICD-10-CM | POA: Diagnosis not present

## 2021-12-10 LAB — RAD ONC ARIA SESSION SUMMARY
Course Elapsed Days: 18
Plan Fractions Treated to Date: 15
Plan Prescribed Dose Per Fraction: 2 Gy
Plan Total Fractions Prescribed: 30
Plan Total Prescribed Dose: 60 Gy
Reference Point Dosage Given to Date: 30 Gy
Reference Point Session Dosage Given: 2 Gy
Session Number: 15

## 2021-12-10 MED FILL — Dexamethasone Sodium Phosphate Inj 100 MG/10ML: INTRAMUSCULAR | Qty: 1 | Status: AC

## 2021-12-11 NOTE — Progress Notes (Signed)
[facility] OFFICE PROGRESS NOTE  Vanessa Neer, MD 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   PDL1: 90%    Molecular Studies: KRAS G12C  PRIOR THERAPY:  Right video-assisted thoracoscopy, Wedge resection right lower lobe nodule, Thoracoscopic right lower lobectomy, Lymph node dissection on July 26, 2018 under the care of Dr. Roxan Hockey    CURRENT THERAPY: Concurrent chemoradiation with weekly carboplatin for AUC of 2 and paclitaxel 45 Mg/M2.  First dose November 22, 2021.  Status post 4 cycles    INTERVAL HISTORY: Vanessa Day 81 y.o. female returns to the clinic today for a follow-up visit accompanied by her husband.  The patient was recently found to have disease recurrence. Therefore, she is currently undergoing a course of concurrent chemoradiation.    The patient suffers from intermittent constipation and diarrhea. She reports he bouts of diarrhea can occur 1-3 times a day but varies. She states she does take a diarrhea medication  Lotronex, which she states helps "most of the time".   She reports she feels like she is more fatigued and "loses steam" with exertion. She has stairs in her house and reports fatigue. She denies dyspnea at rest. Sometimes she has dyspnea on exertion but she is having trouble differentiating if it is related to fatigue. She reports cough at her baseline, overall improved compared to when she was first diagnosed but still present at times. She reports worsening pain in her chest in the past 2-3 weeks on the right side around her ribs and shoulder. She has an overlying rash that is itchy and bumpy. She attributes this to radiation. She received a cream from radiation  and states it helps with her itching. She also reports feeling bloated and sensation of "burping."She also reports worsening blurry vision since summer. Reports tension in her muscles but never reaching the point of a HA. She has arthritis in her neck.   She reports weight loss of of few pounds. She reports taking protein shakes. She denies fever, chills, night sweats.She denies nausea and vomiting.   She previously voiced some dysphagia where she feels like food may be getting stuck in the lower esophagus. She uses carafate.    The patient reports a history of afib that she  states was due to her thyroid. She had her thyroid labs drawn by endocrinology a few months ago and she did not receive the results and is wondering if she needs to continue this.   She is here today for evaluation repeat blood work before undergoing cycle #5.      MEDICAL HISTORY: Past Medical History:  Diagnosis Date   Adenocarcinoma of lung, stage 1, right ([facility]) 2020   Anxiety    Arthritis    back, feet, jaw - per patient "all over"   Breast cancer ([facility]) 2000   Right breast   Family history of breast cancer    GERD (gastroesophageal reflux disease)    Graves disease    H/O hiatal hernia    Hyperlipidemia    Hypertension    IBS (irritable bowel syndrome)    Mitral regurgitation    moderate mitral regurgitation 08/20/20 echo   Neck pain    PAF (paroxysmal atrial fibrillation) ([facility])    ~  09/2020 in setting of hyperthyroidism   PONV (postoperative nausea and vomiting)    Pre-diabetes     ALLERGIES:  is allergic to hydrocodone, codeine, dicyclomine hcl, hyoscyamine, nitrofurantoin, statins, sudafed [pseudoephedrine], oxycodone-acetaminophen, and sulfa antibiotics.  MEDICATIONS:  Current Outpatient Medications  Medication Sig Dispense Refill   acetaminophen (TYLENOL) 500 MG tablet Take 1 tablet (500 mg total) by mouth every 6 (six) hours as needed for mild pain. (Patient taking differently: Take 1,000 mg by  mouth every 6 (six) hours as needed for mild pain.) 30 tablet 0   alosetron (LOTRONEX) 0.5 MG tablet Take 0.25 mg by mouth daily as needed (Diarrhea).     aspirin EC 81 MG tablet Take 1 tablet (81 mg total) by mouth daily. Swallow whole. 90 tablet 3   azelastine (ASTELIN) 0.1 % nasal spray Place 1 spray into both nostrils daily as needed (Congestion). Use in each nostril as directed     celecoxib (CELEBREX) 200 MG capsule Take 200 mg by mouth 2 (two) times daily.     cetirizine (ZYRTEC) 10 MG tablet Take 10 mg by mouth daily as needed for allergies.     ezetimibe (ZETIA) 10 MG tablet Take 10 mg by mouth daily.     hydrochlorothiazide (MICROZIDE) 12.5 MG capsule Take 1 capsule (12.5 mg total) by mouth daily. 45 capsule 3   lidocaine-prilocaine (EMLA) cream Apply 1 Application topically as needed. 30 g 2   LORazepam (ATIVAN) 1 MG tablet Take 1-2 mg by mouth at bedtime as needed for sleep.     losartan (COZAAR) 100 MG tablet Take 100 mg by mouth daily.     methimazole (TAPAZOLE) 5 MG tablet Take 5 mg by mouth daily.     Polyethyl Glycol-Propyl Glycol (SYSTANE OP) Place 1 drop into both eyes 2 (two) times daily as needed (dry eyes).     prochlorperazine (COMPAZINE) 10 MG tablet Take 1 tablet (10 mg total) by mouth every 6 (six) hours as needed for nausea or vomiting. 30 tablet 0   sertraline (ZOLOFT) 50 MG tablet Take 50 mg by mouth at bedtime.     sucralfate (CARAFATE) 1 g tablet Take 1 tablet (1 g total) by mouth 4 (four) times daily -  with meals and at bedtime. Crush 1 tablet in 1 oz water and drink 5 min before meals for radiation induced esophagitis 120 tablet 2   No current facility-administered medications for this visit.    SURGICAL HISTORY:  Past Surgical History:  Procedure Laterality Date   ABDOMINAL HYSTERECTOMY     APPENDECTOMY     BACK SURGERY     BREAST BIOPSY     BREAST SURGERY     CARPOMETACARPEL SUSPENSION PLASTY Left 08/25/2015   Procedure: SUSPENSIONPLASTY LEFT THUMB  TRAPEZIUM EXCISION ABDUCTOR POLLICIS LONGUS TRANSFER;  Surgeon: Daryll Brod, MD;  Location: [facility];  Service: Orthopedics;  Laterality: Left;   CHOLECYSTECTOMY     COLONOSCOPY     several   EYE SURGERY     cataract right and left eye   GAS INSERTION  06/23/2011   Procedure: INSERTION OF GAS;  Surgeon: Hayden Pedro, MD;  Location: [facility];  Service: Ophthalmology;  Laterality: Left;  SF6   IR IMAGING GUIDED PORT INSERTION  12/14/2021   LOBECTOMY Right 07/26/2018   Procedure: RIGHT LOWER LOBE LOBECTOMY;  Surgeon: Melrose Nakayama, MD;  Location: [facility];  Service: Thoracic;  Laterality: Right;   MASTECTOMY     right breast   SCLERAL  BUCKLE  06/23/2011   Procedure: SCLERAL BUCKLE;  Surgeon: Hayden Pedro, MD;  Location: [facility];  Service: Ophthalmology;  Laterality: Left;   SEGMENTECOMY Right 07/26/2018   Procedure: SEGMENTECTOMY;  Surgeon: Melrose Nakayama, MD;  Location: [facility];  Service: Thoracic;  Laterality: Right;   TONSILLECTOMY     VIDEO ASSISTED THORACOSCOPY Right 07/26/2018   Procedure: VIDEO ASSISTED THORACOSCOPY;  Surgeon: Melrose Nakayama, MD;  Location: [facility];  Service: Thoracic;  Laterality: Right;   VIDEO BRONCHOSCOPY WITH ENDOBRONCHIAL ULTRASOUND N/A 10/27/2021   Procedure: VIDEO BRONCHOSCOPY WITH ENDOBRONCHIAL ULTRASOUND;  Surgeon: Melrose Nakayama, MD;  Location: [facility];  Service: Thoracic;  Laterality: N/A;    REVIEW OF SYSTEMS:   Review of Systems  Constitutional: Positive for fatigue. Positive for taste alterations. Negative for chills and fever.  HENT: Positive for odynophagia. Negative for mouth sores, nosebleeds, sore throat and trouble swallowing.   Eyes: Negative for eye problems and icterus.  Respiratory: Negative for hemoptysis, and wheezing.  shortness of breath on exertion. Stable to improved cough Cardiovascular: Negative for chest pain and leg swelling.  Gastrointestinal: Negative for abdominal pain, nausea and vomiting.  Positive for Diarrhea constipation Genitourinary: Negative for bladder incontinence, difficulty urinating, dysuria, frequency and hematuria.   Musculoskeletal: Positive for chronic back and neck pain. Negative for gait problem,.  Skin: Positive for rash on right chest and scapula.  Neurological: Negative for dizziness, extremity weakness, gait problem, headaches, light-headedness and seizures.  Hematological: Negative for adenopathy. Does not bruise/bleed easily.  Psychiatric/Behavioral: Negative for confusion, depression and sleep disturbance. The patient is not nervous/anxious.     PHYSICAL EXAMINATION:  Blood pressure 112/61, pulse 100, temperature 98.2 F (36.8 C), temperature source Oral, resp. rate 15, weight 149 lb 8 oz (67.8 kg), SpO2 99 %.  ECOG PERFORMANCE STATUS: 1  Physical Exam  Constitutional: Oriented to person, place, and time and well-developed, well-nourished, and in no distress.  HENT:  Head: Normocephalic and atraumatic.  Mouth/Throat: Oropharynx is clear and moist. No oropharyngeal exudate.  Eyes: Conjunctivae are normal. Right eye exhibits no discharge. Left eye exhibits no discharge. No scleral icterus.  Neck: Normal range of motion. Neck supple.  Cardiovascular: Normal rate, regular rhythm, normal heart sounds and intact distal pulses.    Pulmonary/Chest: Effort normal and breath sounds normal. No respiratory distress. No wheezes. No rales.  Abdominal: Exhibits no distension   Musculoskeletal: Normal range of motion. Exhibits no edema.  Lymphadenopathy:    No cervical adenopathy  observed.  Neurological: Alert and oriented to person, place, and time. Exhibits normal muscle tone. Gait normal. Coordination normal.  Skin: Skin is warm and dry. Rash on right anterior chest and scapula. Not diaphoretic. No erythema. No pallor.  Psychiatric: Mood, memory and judgment normal.  Vitals reviewed.  LABORATORY DATA: Lab Results  Component Value Date   WBC 7.2  12/20/2021   HGB 12.2 12/20/2021   HCT 35.6 (L) 12/20/2021   MCV 94.2 12/20/2021   PLT 243 12/20/2021      Chemistry      Component Value Date/Time   NA 139 12/20/2021 0842   K 3.3 (L) 12/20/2021 0842   CL 103 12/20/2021 0842   CO2 28 12/20/2021 0842   BUN 31 (H) 12/20/2021 0842   CREATININE 1.23 (H) 12/20/2021 0842      Component Value Date/Time   CALCIUM 9.5 12/20/2021 0842   ALKPHOS 71 12/20/2021 0842   AST 14 (L) 12/20/2021 0842   ALT 18 12/20/2021 0842  BILITOT 1.1 12/20/2021 0842       RADIOGRAPHIC STUDIES:  IR IMAGING GUIDED PORT INSERTION  Result Date: 12/14/2021 INDICATION: 81 year old female referred for port catheter EXAM: IMAGE GUIDED PORT CATHETER MEDICATIONS: None ANESTHESIA/SEDATION: Moderate (conscious) sedation was employed during this procedure. A total of Versed 2 mg and Fentanyl 100 mcg was administered intravenously. Moderate Sedation Time: 21 minutes. The patient's level of consciousness and vital signs were monitored continuously by radiology nursing throughout the procedure under my direct supervision. FLUOROSCOPY TIME:  Fluoroscopy Time: 0 minutes 12 seconds (2 mGy). COMPLICATIONS: None PROCEDURE: Informed written consent was obtained from the patient after a thorough discussion of the procedural risks, benefits and alternatives. All questions were addressed. Maximal Sterile Barrier Technique was utilized including caps, mask, sterile gowns, sterile gloves, sterile drape, hand hygiene and skin antiseptic. A timeout was performed prior to the initiation of the procedure. Ultrasound survey was performed with images stored and sent to PACs. Right IJ vein documented to be patent. The right neck and chest was prepped with chlorhexidine, and draped in the usual sterile fashion using maximum barrier technique (cap and mask, sterile gown, sterile gloves, large sterile sheet, hand hygiene and cutaneous antiseptic). Local anesthesia was attained by infiltration with 1%  lidocaine without epinephrine. Ultrasound demonstrated patency of the right internal jugular vein, and this was documented with an image. Under real-time ultrasound guidance, this vein was accessed with a 21 gauge micropuncture needle and image documentation was performed. A small dermatotomy was made at the access site with an 11 scalpel. A 0.018" wire was advanced into the SVC and used to estimate the length of the internal catheter. The access needle exchanged for a 100F micropuncture vascular sheath. The 0.018" wire was then removed and a 0.035" wire advanced into the IVC. An appropriate location for the subcutaneous reservoir was selected below the clavicle and an incision was made through the skin and underlying soft tissues. The subcutaneous tissues were then dissected using a combination of blunt and sharp surgical technique and a pocket was formed. A single lumen power injectable portacatheter was then tunneled through the subcutaneous tissues from the pocket to the dermatotomy and the port reservoir placed within the subcutaneous pocket. The venous access site was then serially dilated and a peel away vascular sheath placed over the wire. The wire was removed and the port catheter advanced into position under fluoroscopic guidance. The catheter tip is positioned in the cavoatrial junction. This was documented with a spot image. The portacatheter was then tested and found to flush and aspirate well. The port was flushed with saline followed by 100 units/mL heparinized saline. The pocket was then closed in two layers using first subdermal inverted interrupted absorbable sutures followed by a running subcuticular suture. The epidermis was then sealed with Dermabond. The dermatotomy at the venous access site was also seal with Dermabond. Patient tolerated the procedure well and remained hemodynamically stable throughout. No complications encountered and no significant blood loss encountered IMPRESSION: Status  post right IJ port catheter placement. Signed, Dulcy Fanny. Nadene Rubins, RPVI [facility] Radiology Electronically Signed   By: Corrie Mckusick D.O.   On: 12/14/2021 15:30     ASSESSMENT/PLAN:  This is a very pleasant 81 year old Caucasian female diagnosed with stage IIIa (T1c, N2, M0) non-small cell lung cancer, adenocarcinoma.  The patient was initially diagnosed as a stage Ia (T1c, N0, M0) non-small cell lung cancer, adenocarcinoma.  The patient is status post a right lower  lobectomy with lymph node dissection July 2020.  She had disease recurrence in October 2023.  Her PD-L1 expression is 90%.  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   The patient is currently undergoing concurrent chemoradiation with carboplatin for an AUC of 2 and paclitaxel 45 mg per metered squared.  Status post 4 cycles.  Her last day radiation is tentatively scheduled for 12/14 this treatment will be followed by consolidation immunotherapy if she has no evidence of disease question after the induction phase   Labs were reviewed.  Recommend that she proceed with cycle #5 today scheduled.  We will see her back for follow-up visit in 2 weeks for evaluation repeat blood work before starting cycle #7  Regarding her diarrhea, the patient has a medication for IBS which she states helps her diarrhea.  Discussed that if she does not find this helpful that she may take Imodium as well.  Also encouraged to hydrate well.  For her taste alterations, there was no evidence of thrush on exam.  Discussed this may be related to her carboplatin.  Encouraged to use salt water rinses and Biotene.  Discussed that patients often find certain foods such as carbs to taste poorly while they find certain foods such as citrusy foods to taste  good while on chemo.  She may need to adjust her dietary habits.  Regarding the arthritis in her neck, she will continue to take Tylenol.  Regarding her questions about her thyroid, I asked that she reach out to her endocrinologist.  She reportedly had some labs performed at the office and did not get the results.  Patient mentions increased hair loss which may also be related to her thyroid.   The patient denies any symptoms of respiratory infection today.  Her cough is slightly improved to stable.  She reports she is only short of breath related to fatigue and "losing steam" with exertion.  I did offer chest x-ray which she declined.  For the rash, this may be related to radiation.  She did receive a cream from the office.  Encouraged her to continue to use this cream.  If she needs any additional support for itching, discussed she may use hydrocortisone cream.   The patient was advised to call immediately if she has any concerning symptoms in the interval. The patient voices understanding of current disease status and treatment options and is in agreement with the current care plan. All questions were answered. The patient knows to call the clinic with any problems, questions or concerns. We can certainly see the patient much sooner if necessary    No orders of the defined types were placed in this encounter.    The total time spent in the appointment was 20-29 minutes.   Lorrie Gargan L Annabella Elford, PA-C 12/20/21

## 2021-12-12 ENCOUNTER — Other Ambulatory Visit: Payer: Self-pay

## 2021-12-12 ENCOUNTER — Ambulatory Visit
Admission: RE | Admit: 2021-12-12 | Discharge: 2021-12-12 | Disposition: A | Discharge status: Home / Self Care | Payer: Medicare PPO | Source: Ambulatory Visit | Attending: Radiation Oncology | Admitting: Radiation Oncology

## 2021-12-12 DIAGNOSIS — I10 Essential (primary) hypertension: Secondary | ICD-10-CM | POA: Diagnosis not present

## 2021-12-12 DIAGNOSIS — Z853 Personal history of malignant neoplasm of breast: Secondary | ICD-10-CM | POA: Diagnosis not present

## 2021-12-12 DIAGNOSIS — R1310 Dysphagia, unspecified: Secondary | ICD-10-CM | POA: Diagnosis not present

## 2021-12-12 DIAGNOSIS — Z902 Acquired absence of lung [part of]: Secondary | ICD-10-CM | POA: Diagnosis not present

## 2021-12-12 DIAGNOSIS — Z510 Encounter for antineoplastic radiation therapy: Secondary | ICD-10-CM | POA: Diagnosis not present

## 2021-12-12 DIAGNOSIS — I480 Paroxysmal atrial fibrillation: Secondary | ICD-10-CM | POA: Diagnosis not present

## 2021-12-12 DIAGNOSIS — C3431 Malignant neoplasm of lower lobe, right bronchus or lung: Secondary | ICD-10-CM | POA: Diagnosis not present

## 2021-12-12 DIAGNOSIS — Z87891 Personal history of nicotine dependence: Secondary | ICD-10-CM | POA: Diagnosis not present

## 2021-12-12 DIAGNOSIS — Z5111 Encounter for antineoplastic chemotherapy: Secondary | ICD-10-CM | POA: Diagnosis not present

## 2021-12-12 DIAGNOSIS — M549 Dorsalgia, unspecified: Secondary | ICD-10-CM | POA: Diagnosis not present

## 2021-12-12 LAB — RAD ONC ARIA SESSION SUMMARY
Course Elapsed Days: 20
Plan Fractions Treated to Date: 16
Plan Prescribed Dose Per Fraction: 2 Gy
Plan Total Fractions Prescribed: 30
Plan Total Prescribed Dose: 60 Gy
Reference Point Dosage Given to Date: 32 Gy
Reference Point Session Dosage Given: 2 Gy
Session Number: 16

## 2021-12-13 ENCOUNTER — Inpatient Hospital Stay: Payer: Medicare PPO

## 2021-12-13 ENCOUNTER — Other Ambulatory Visit: Payer: Self-pay

## 2021-12-13 ENCOUNTER — Ambulatory Visit: Payer: Medicare PPO

## 2021-12-13 ENCOUNTER — Other Ambulatory Visit: Payer: Self-pay | Admitting: Internal Medicine

## 2021-12-13 ENCOUNTER — Ambulatory Visit
Admission: RE | Admit: 2021-12-13 | Discharge: 2021-12-13 | Disposition: A | Discharge status: Home / Self Care | Payer: Medicare PPO | Source: Ambulatory Visit | Attending: Radiation Oncology | Admitting: Radiation Oncology

## 2021-12-13 VITALS — BP 120/73 | HR 90 | Temp 98.0°F | Resp 17 | Wt 153.5 lb

## 2021-12-13 DIAGNOSIS — Z510 Encounter for antineoplastic radiation therapy: Secondary | ICD-10-CM | POA: Diagnosis not present

## 2021-12-13 DIAGNOSIS — I10 Essential (primary) hypertension: Secondary | ICD-10-CM | POA: Diagnosis not present

## 2021-12-13 DIAGNOSIS — Z902 Acquired absence of lung [part of]: Secondary | ICD-10-CM | POA: Diagnosis not present

## 2021-12-13 DIAGNOSIS — C3431 Malignant neoplasm of lower lobe, right bronchus or lung: Secondary | ICD-10-CM | POA: Diagnosis not present

## 2021-12-13 DIAGNOSIS — M549 Dorsalgia, unspecified: Secondary | ICD-10-CM | POA: Diagnosis not present

## 2021-12-13 DIAGNOSIS — Z87891 Personal history of nicotine dependence: Secondary | ICD-10-CM | POA: Diagnosis not present

## 2021-12-13 DIAGNOSIS — R1310 Dysphagia, unspecified: Secondary | ICD-10-CM | POA: Diagnosis not present

## 2021-12-13 DIAGNOSIS — Z853 Personal history of malignant neoplasm of breast: Secondary | ICD-10-CM | POA: Diagnosis not present

## 2021-12-13 DIAGNOSIS — C3491 Malignant neoplasm of unspecified part of right bronchus or lung: Secondary | ICD-10-CM

## 2021-12-13 DIAGNOSIS — Z5111 Encounter for antineoplastic chemotherapy: Secondary | ICD-10-CM | POA: Diagnosis not present

## 2021-12-13 DIAGNOSIS — I480 Paroxysmal atrial fibrillation: Secondary | ICD-10-CM | POA: Diagnosis not present

## 2021-12-13 LAB — CBC WITH DIFFERENTIAL (CANCER CENTER ONLY)
Abs Immature Granulocytes: 0.05 10*3/uL (ref 0.00–0.07)
Basophils Absolute: 0 10*3/uL (ref 0.0–0.1)
Basophils Relative: 1 %
Eosinophils Absolute: 0.1 10*3/uL (ref 0.0–0.5)
Eosinophils Relative: 1 %
HCT: 35 % — ABNORMAL LOW (ref 36.0–46.0)
Hemoglobin: 12 g/dL (ref 12.0–15.0)
Immature Granulocytes: 1 %
Lymphocytes Relative: 13 %
Lymphs Abs: 0.8 10*3/uL (ref 0.7–4.0)
MCH: 32.2 pg (ref 26.0–34.0)
MCHC: 34.3 g/dL (ref 30.0–36.0)
MCV: 93.8 fL (ref 80.0–100.0)
Monocytes Absolute: 0.4 10*3/uL (ref 0.1–1.0)
Monocytes Relative: 7 %
Neutro Abs: 5 10*3/uL (ref 1.7–7.7)
Neutrophils Relative %: 77 %
Platelet Count: 261 10*3/uL (ref 150–400)
RBC: 3.73 MIL/uL — ABNORMAL LOW (ref 3.87–5.11)
RDW: 13.3 % (ref 11.5–15.5)
WBC Count: 6.4 10*3/uL (ref 4.0–10.5)
nRBC: 0 % (ref 0.0–0.2)

## 2021-12-13 LAB — RAD ONC ARIA SESSION SUMMARY
Course Elapsed Days: 21
Plan Fractions Treated to Date: 17
Plan Prescribed Dose Per Fraction: 2 Gy
Plan Total Fractions Prescribed: 30
Plan Total Prescribed Dose: 60 Gy
Reference Point Dosage Given to Date: 34 Gy
Reference Point Session Dosage Given: 2 Gy
Session Number: 17

## 2021-12-13 LAB — CMP (CANCER CENTER ONLY)
ALT: 14 U/L (ref 0–44)
AST: 12 U/L — ABNORMAL LOW (ref 15–41)
Albumin: 4.2 g/dL (ref 3.5–5.0)
Alkaline Phosphatase: 59 U/L (ref 38–126)
Anion gap: 8 (ref 5–15)
BUN: 30 mg/dL — ABNORMAL HIGH (ref 8–23)
CO2: 29 mmol/L (ref 22–32)
Calcium: 9.1 mg/dL (ref 8.9–10.3)
Chloride: 102 mmol/L (ref 98–111)
Creatinine: 1.11 mg/dL — ABNORMAL HIGH (ref 0.44–1.00)
GFR, Estimated: 50 mL/min — ABNORMAL LOW (ref >60)
Glucose, Bld: 145 mg/dL — ABNORMAL HIGH (ref 70–99)
Potassium: 3.6 mmol/L (ref 3.5–5.1)
Sodium: 139 mmol/L (ref 135–145)
Total Bilirubin: 1 mg/dL (ref 0.3–1.2)
Total Protein: 6.9 g/dL (ref 6.5–8.1)

## 2021-12-13 MED ORDER — FAMOTIDINE IN NACL 20-0.9 MG/50ML-% IV SOLN
20.0000 mg | Freq: Once | INTRAVENOUS | Status: AC
  Administered 2021-12-13: 20 mg via INTRAVENOUS
  Filled 2021-12-13: qty 50

## 2021-12-13 MED ORDER — PALONOSETRON HCL INJECTION 0.25 MG/5ML
0.2500 mg | Freq: Once | INTRAVENOUS | Status: AC
  Administered 2021-12-13: 0.25 mg via INTRAVENOUS
  Filled 2021-12-13: qty 5

## 2021-12-13 MED ORDER — SODIUM CHLORIDE 0.9 % IV SOLN
130.0000 mg | Freq: Once | INTRAVENOUS | Status: AC
  Administered 2021-12-13: 130 mg via INTRAVENOUS
  Filled 2021-12-13: qty 13

## 2021-12-13 MED ORDER — SODIUM CHLORIDE 0.9 % IV SOLN
10.0000 mg | Freq: Once | INTRAVENOUS | Status: AC
  Administered 2021-12-13: 10 mg via INTRAVENOUS
  Filled 2021-12-13: qty 10

## 2021-12-13 MED ORDER — SODIUM CHLORIDE 0.9 % IV SOLN: Freq: Once | INTRAVENOUS | Status: AC

## 2021-12-13 MED ORDER — CETIRIZINE HCL 10 MG/ML IV SOLN
10.0000 mg | Freq: Once | INTRAVENOUS | Status: AC
  Administered 2021-12-13: 10 mg via INTRAVENOUS
  Filled 2021-12-13: qty 1

## 2021-12-13 MED ORDER — SODIUM CHLORIDE 0.9 % IV SOLN
45.0000 mg/m2 | Freq: Once | INTRAVENOUS | Status: AC
  Administered 2021-12-13: 78 mg via INTRAVENOUS
  Filled 2021-12-13: qty 13

## 2021-12-13 NOTE — Progress Notes (Signed)
OK to use today's CrCl for Carbo dose per Dr. Julien Nordmann.  Kennith Center, Pharm.D., CPP 12/13/2021@11 :26 AM

## 2021-12-14 ENCOUNTER — Other Ambulatory Visit: Payer: Self-pay

## 2021-12-14 ENCOUNTER — Ambulatory Visit (HOSPITAL_COMMUNITY)
Admission: RE | Admit: 2021-12-14 | Discharge: 2021-12-14 | Disposition: A | Discharge status: Home / Self Care | Payer: Medicare PPO | Source: Ambulatory Visit | Attending: Physician Assistant | Admitting: Physician Assistant

## 2021-12-14 ENCOUNTER — Ambulatory Visit: Payer: Medicare PPO

## 2021-12-14 ENCOUNTER — Encounter (HOSPITAL_COMMUNITY): Payer: Self-pay

## 2021-12-14 ENCOUNTER — Other Ambulatory Visit: Payer: Medicare PPO

## 2021-12-14 ENCOUNTER — Ambulatory Visit
Admission: RE | Admit: 2021-12-14 | Discharge: 2021-12-14 | Disposition: A | Discharge status: Home / Self Care | Payer: Medicare PPO | Source: Ambulatory Visit | Attending: Radiation Oncology | Admitting: Radiation Oncology

## 2021-12-14 ENCOUNTER — Ambulatory Visit: Payer: Medicare PPO | Admitting: Adult Health

## 2021-12-14 DIAGNOSIS — C3491 Malignant neoplasm of unspecified part of right bronchus or lung: Secondary | ICD-10-CM | POA: Insufficient documentation

## 2021-12-14 DIAGNOSIS — I480 Paroxysmal atrial fibrillation: Secondary | ICD-10-CM | POA: Diagnosis not present

## 2021-12-14 DIAGNOSIS — Z902 Acquired absence of lung [part of]: Secondary | ICD-10-CM | POA: Diagnosis not present

## 2021-12-14 DIAGNOSIS — Z5111 Encounter for antineoplastic chemotherapy: Secondary | ICD-10-CM | POA: Diagnosis not present

## 2021-12-14 DIAGNOSIS — Z510 Encounter for antineoplastic radiation therapy: Secondary | ICD-10-CM | POA: Diagnosis not present

## 2021-12-14 DIAGNOSIS — Z87891 Personal history of nicotine dependence: Secondary | ICD-10-CM | POA: Insufficient documentation

## 2021-12-14 DIAGNOSIS — R1310 Dysphagia, unspecified: Secondary | ICD-10-CM | POA: Diagnosis not present

## 2021-12-14 DIAGNOSIS — I10 Essential (primary) hypertension: Secondary | ICD-10-CM | POA: Diagnosis not present

## 2021-12-14 DIAGNOSIS — M549 Dorsalgia, unspecified: Secondary | ICD-10-CM | POA: Diagnosis not present

## 2021-12-14 DIAGNOSIS — C3431 Malignant neoplasm of lower lobe, right bronchus or lung: Secondary | ICD-10-CM | POA: Diagnosis not present

## 2021-12-14 DIAGNOSIS — C3490 Malignant neoplasm of unspecified part of unspecified bronchus or lung: Secondary | ICD-10-CM

## 2021-12-14 DIAGNOSIS — Z452 Encounter for adjustment and management of vascular access device: Secondary | ICD-10-CM | POA: Diagnosis not present

## 2021-12-14 DIAGNOSIS — Z853 Personal history of malignant neoplasm of breast: Secondary | ICD-10-CM | POA: Diagnosis not present

## 2021-12-14 HISTORY — PX: IR IMAGING GUIDED PORT INSERTION: IMG5740

## 2021-12-14 LAB — RAD ONC ARIA SESSION SUMMARY
Course Elapsed Days: 22
Plan Fractions Treated to Date: 18
Plan Prescribed Dose Per Fraction: 2 Gy
Plan Total Fractions Prescribed: 30
Plan Total Prescribed Dose: 60 Gy
Reference Point Dosage Given to Date: 36 Gy
Reference Point Session Dosage Given: 2 Gy
Session Number: 18

## 2021-12-14 MED ORDER — MIDAZOLAM HCL 2 MG/2ML IJ SOLN
INTRAMUSCULAR | Status: AC
  Filled 2021-12-14: qty 2

## 2021-12-14 MED ORDER — HEPARIN SOD (PORK) LOCK FLUSH 100 UNIT/ML IV SOLN
INTRAVENOUS | Status: AC
  Administered 2021-12-14: 500 [IU] via INTRAPERITONEAL
  Filled 2021-12-14: qty 5

## 2021-12-14 MED ORDER — SODIUM CHLORIDE 0.9 % IV SOLN: INTRAVENOUS | Status: DC

## 2021-12-14 MED ORDER — MIDAZOLAM HCL 2 MG/2ML IJ SOLN
INTRAMUSCULAR | Status: AC | PRN
  Administered 2021-12-14 (×2): 1 mg via INTRAVENOUS

## 2021-12-14 MED ORDER — FENTANYL CITRATE (PF) 100 MCG/2ML IJ SOLN
INTRAMUSCULAR | Status: AC | PRN
  Administered 2021-12-14 (×2): 50 ug via INTRAVENOUS

## 2021-12-14 MED ORDER — LIDOCAINE HCL (PF) 1 % IJ SOLN
INTRAMUSCULAR | Status: AC | PRN
  Administered 2021-12-14: 20 mL

## 2021-12-14 MED ORDER — LIDOCAINE HCL 1 % IJ SOLN
INTRAMUSCULAR | Status: AC
  Filled 2021-12-14: qty 20

## 2021-12-14 MED ORDER — FENTANYL CITRATE (PF) 100 MCG/2ML IJ SOLN
INTRAMUSCULAR | Status: AC
  Filled 2021-12-14: qty 2

## 2021-12-14 NOTE — H&P (Signed)
Chief Complaint: Patient was seen in consultation today for port placement   Referring Physician(s): Heilingoetter,Cassandra L  Supervising Physician: Corrie Mckusick  Patient Status: [facility] - Out-pt  History of Present Illness: Vanessa Day is a 81 y.o. female with recurrent non-small cell lung cancer. She has been receiving chemotherapy but recent difficulty with PIV access. She is referred for port placement. PMHx, meds, labs, imaging, allergies reviewed. Feels well, no recent fevers, chills, illness. Has been NPO today as directed.    Past Medical History:  Diagnosis Date   Adenocarcinoma of lung, stage 1, right ([place]) 2020   Anxiety    Arthritis    back, feet, jaw - per patient "all over"   Breast cancer ([place]) 2000   Right breast   Family history of breast cancer    GERD (gastroesophageal reflux disease)    Graves disease    H/O hiatal hernia    Hyperlipidemia    Hypertension    IBS (irritable bowel syndrome)    Mitral regurgitation    moderate mitral regurgitation 08/20/20 echo   Neck pain    PAF (paroxysmal atrial fibrillation) ([place])    ~ 09/2020 in setting of hyperthyroidism   PONV (postoperative nausea and vomiting)    Pre-diabetes     Past Surgical History:  Procedure Laterality Date   ABDOMINAL HYSTERECTOMY     APPENDECTOMY     BACK SURGERY     BREAST BIOPSY     BREAST SURGERY     CARPOMETACARPEL SUSPENSION PLASTY Left 08/25/2015   Procedure: SUSPENSIONPLASTY LEFT THUMB TRAPEZIUM EXCISION ABDUCTOR POLLICIS LONGUS TRANSFER;  Surgeon: Daryll Brod, MD;  Location: [place];  Service: Orthopedics;  Laterality: Left;   CHOLECYSTECTOMY     COLONOSCOPY     several   EYE SURGERY     cataract right and left eye   GAS INSERTION  06/23/2011   Procedure: INSERTION OF GAS;  Surgeon: Hayden Pedro, MD;  Location: [place];  Service: Ophthalmology;  Laterality: Left;  SF6   LOBECTOMY Right 07/26/2018   Procedure: RIGHT LOWER LOBE LOBECTOMY;   Surgeon: Melrose Nakayama, MD;  Location: [place];  Service: Thoracic;  Laterality: Right;   MASTECTOMY     right breast   SCLERAL BUCKLE  06/23/2011   Procedure: SCLERAL BUCKLE;  Surgeon: Hayden Pedro, MD;  Location: [place];  Service: Ophthalmology;  Laterality: Left;   SEGMENTECOMY Right 07/26/2018   Procedure: SEGMENTECTOMY;  Surgeon: Melrose Nakayama, MD;  Location: [place];  Service: Thoracic;  Laterality: Right;   TONSILLECTOMY     VIDEO ASSISTED THORACOSCOPY Right 07/26/2018   Procedure: VIDEO ASSISTED THORACOSCOPY;  Surgeon: Melrose Nakayama, MD;  Location: [place];  Service: Thoracic;  Laterality: Right;   VIDEO BRONCHOSCOPY WITH ENDOBRONCHIAL ULTRASOUND N/A 10/27/2021   Procedure: VIDEO BRONCHOSCOPY WITH ENDOBRONCHIAL ULTRASOUND;  Surgeon: Melrose Nakayama, MD;  Location: [facility];  Service: Thoracic;  Laterality: N/A;    Allergies: Hydrocodone, Codeine, Dicyclomine hcl, Hyoscyamine, Nitrofurantoin, Statins, Sudafed [pseudoephedrine], Oxycodone-acetaminophen, and Sulfa antibiotics  Medications: Prior to Admission medications   Medication Sig Start Date End Date Taking? Authorizing Provider  acetaminophen (TYLENOL) 500 MG tablet Take 1 tablet (500 mg total) by mouth every 6 (six) hours as needed for mild pain. Patient taking differently: Take 1,000 mg by mouth every 6 (six) hours as needed for mild pain. 08/02/18   Nani Skillern, PA-C  alosetron (LOTRONEX) 0.5 MG tablet Take 0.25 mg by mouth daily as needed (  Diarrhea).    [provider]  aspirin EC 81 MG tablet Take 1 tablet (81 mg total) by mouth daily. Swallow whole. 04/05/21   Belva Crome, MD  azelastine (ASTELIN) 0.1 % nasal spray Place 1 spray into both nostrils daily as needed (Congestion). Use in each nostril as directed    [provider]  celecoxib (CELEBREX) 200 MG capsule Take 200 mg by mouth 2 (two) times daily. 07/28/21   [provider]  cetirizine (ZYRTEC) 10 MG tablet  Take 10 mg by mouth daily as needed for allergies.    [provider]  ezetimibe (ZETIA) 10 MG tablet Take 10 mg by mouth daily.    [provider]  hydrochlorothiazide (MICROZIDE) 12.5 MG capsule Take 1 capsule (12.5 mg total) by mouth daily. 08/03/20   Belva Crome, MD  lidocaine-prilocaine (EMLA) cream Apply 1 Application topically as needed. 12/07/21   Heilingoetter, Cassandra L, PA-C  LORazepam (ATIVAN) 1 MG tablet Take 1-2 mg by mouth at bedtime as needed for sleep.    [provider]  losartan (COZAAR) 100 MG tablet Take 100 mg by mouth daily. 04/12/18   [provider]  methimazole (TAPAZOLE) 5 MG tablet Take 5 mg by mouth daily. 09/06/21   Eilene Ghazi, NP  Polyethyl Glycol-Propyl Glycol (SYSTANE OP) Place 1 drop into both eyes 2 (two) times daily as needed (dry eyes).    [provider]  prochlorperazine (COMPAZINE) 10 MG tablet Take 1 tablet (10 mg total) by mouth every 6 (six) hours as needed for nausea or vomiting. 11/08/21   Curt Bears, MD  sertraline (ZOLOFT) 50 MG tablet Take 50 mg by mouth at bedtime.    [provider]  sucralfate (CARAFATE) 1 g tablet Take 1 tablet (1 g total) by mouth 4 (four) times daily -  with meals and at bedtime. Crush 1 tablet in 1 oz water and drink 5 min before meals for radiation induced esophagitis 12/07/21   Hayden Pedro, PA-C     Family History  Problem Relation Age of Onset   Breast cancer Mother        dx over 82   Prostate cancer Father        dx late 4s-early 70s   COPD Father    Cerebral palsy Brother    Lung cancer Maternal Uncle        smoker   Stroke Maternal Grandfather    Cancer Cousin        NOS   Breast cancer Daughter 63   Anesthesia problems Neg Hx    Hypotension Neg Hx    Malignant hyperthermia Neg Hx    Pseudochol deficiency Neg Hx     Social History   Socioeconomic History   Marital status: Married    Spouse name: Not on file   Number of  children: Not on file   Years of education: Not on file   Highest education level: Not on file  Occupational History   Not on file  Tobacco Use   Smoking status: Former    Packs/day: 1.00    Years: 30.00    Total pack years: 30.00    Types: Cigarettes    Quit date: 06/22/2002    Years since quitting: 19.4   Smokeless tobacco: Never  Vaping Use   Vaping Use: Never used  Substance and Sexual Activity   Alcohol use: Yes    Alcohol/week: 7.0 standard drinks of alcohol    Types: 7 Glasses  of wine per week    Comment: small glass every night   Drug use: Yes    Types: Flunitrazepam   Sexual activity: Not Currently  Other Topics Concern   Not on file  Social History Narrative   Not on file   Social Determinants of Health   Financial Resource Strain: Not on file  Food Insecurity: Not on file  Transportation Needs: Not on file  Physical Activity: Not on file  Stress: Not on file  Social Connections: Not on file    Review of Systems: A 12 point ROS discussed and pertinent positives are indicated in the HPI above.  All other systems are negative.  Review of Systems  Vital Signs: There were no vitals taken for this visit.  Physical Exam Constitutional:      Appearance: She is not ill-appearing.  HENT:     Mouth/Throat:     Mouth: Mucous membranes are moist.     Pharynx: Oropharynx is clear.  Cardiovascular:     Rate and Rhythm: Normal rate and regular rhythm.     Heart sounds: Normal heart sounds.  Pulmonary:     Effort: Pulmonary effort is normal. No respiratory distress.     Breath sounds: Normal breath sounds.  Neurological:     General: No focal deficit present.     Mental Status: She is alert and oriented to person, place, and time.  Psychiatric:        Mood and Affect: Mood normal.        Thought Content: Thought content normal.     Imaging: MR BRAIN W WO CONTRAST  Result Date: 11/19/2021 CLINICAL DATA:  Provided history: Malignant neoplasm of  unspecified part of unspecified bronchus or lung. Non-small cell lung cancer, staging. EXAM: MRI HEAD WITHOUT AND WITH CONTRAST TECHNIQUE: Multiplanar, multiecho pulse sequences of the brain and surrounding structures were obtained without and with intravenous contrast. CONTRAST:  75mL GADAVIST GADOBUTROL 1 MMOL/ML IV SOLN COMPARISON:  PET-CT 10/01/2021. FINDINGS: Brain: Mild generalized parenchymal atrophy. Mild for age multifocal T2 FLAIR hyperintense signal abnormality within the cerebral white matter and pons, nonspecific but compatible with chronic small vessel ischemic disease. Small T2 hyperintense foci within the bilateral deep gray nuclei, likely reflecting a combination of prominent perivascular spaces and chronic lacunar infarcts. Small chronic infarct within the right cerebellar hemisphere. There is no acute infarct. No evidence of an intracranial mass. No chronic intracranial blood products. No extra-axial fluid collection. No midline shift. No pathologic intracranial enhancement identified. Vascular: Maintained flow voids within the proximal large arterial vessels. 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 Skull and upper cervical spine: No focal suspicious marrow lesion. Sinuses/Orbits: No mass or acute finding within the imaged orbits. Left scleral buckle. Prior bilateral ocular lens replacement. Small mucous retention cysts within the right sphenoid sinus. Mild mucosal thickening within left ethmoid air cells. Other: Trace fluid within the right mastoid air cells. IMPRESSION: No evidence of intracranial metastatic disease. Parenchymal atrophy and chronic small vessel ischemic disease, as described. Small chronic infarct within the right cerebellar hemisphere. Electronically Signed   By: Kellie Simmering D.O.   On: 11/19/2021 07:47    Labs:  CBC: Recent Labs    11/22/21 1120 11/29/21 1013 12/07/21 0849 12/13/21 0932  WBC 7.8 6.8  5.1 6.4  HGB 12.8 13.1 12.4 12.0  HCT 37.7 38.1 36.4 35.0*  PLT 289 260 246 261    COAGS: Recent Labs    10/25/21  1497  INR 0.9  APTT 33    BMP: Recent Labs    11/22/21 1120 11/29/21 1013 12/07/21 0849 12/13/21 0932  NA 141 135 139 139  K 3.8 3.7 4.0 3.6  CL 106 99 102 102  CO2 27 25 29 29   GLUCOSE 108* 110* 152* 145*  BUN 32* 47* 34* 30*  CALCIUM 8.8* 9.2 8.8* 9.1  CREATININE 1.44* 1.67* 1.35* 1.11*  GFRNONAA 37* 31* 39* 50*    LIVER FUNCTION TESTS: Recent Labs    11/22/21 1120 11/29/21 1013 12/07/21 0849 12/13/21 0932  BILITOT 0.8 1.7* 0.9 1.0  AST 13* 18 12* 12*  ALT 13 22 15 14   ALKPHOS 61 65 65 59  PROT 6.8 7.2 6.8 6.9  ALBUMIN 4.1 4.4 4.2 4.2     Assessment and Plan: Recurrent non-small cell lung cancer  For port placement Risks and benefits of image guided port-a-catheter placement was discussed with the patient including, but not limited to bleeding, infection, pneumothorax, or fibrin sheath development and need for additional procedures.  All of the patient's questions were answered, patient is agreeable to proceed. Consent signed and in chart.   Electronically Signed: Ascencion Dike, PA-C 12/14/2021, 12:38 PM   I spent a total of 20 in face to face in clinical consultation, greater than 50% of which was counseling/coordinating care for port

## 2021-12-14 NOTE — Procedures (Signed)
Interventional Radiology Procedure Note  Procedure: Placement of a right IJ approach single lumen PowerPort.  Tip is positioned at the superior cavoatrial junction and catheter is ready for immediate use.  Complications: None Recommendations:  - Ok to shower tomorrow - Do not submerge for 7 days - Routine line care   Signed,  Rodert Hinch S. Lindzy Rupert, DO   

## 2021-12-14 NOTE — Discharge Instructions (Addendum)
For questions /concerns may call Interventional Radiology clinic [phone]   You may remove your dressing and shower tomorrow afternoon  DO NOT use EMLA cream (as well as other creams , lotions or ointments on the site) until it has healed (approximately 2 weeks after port placement) as the cream will remove surgical glue on your incision.    Implanted Port Insertion, Care After This sheet gives you information about how to care for yourself after your procedure. Your health care provider may also give you more specific instructions. If you have problems or questions, contact your health careprovider. What can I expect after the procedure? After the procedure, it is common to have: Discomfort at the port insertion site. Bruising on the skin over the port. This should improve over 3-4 days. Follow these instructions at home: [facility] care After your port is placed, you will get a manufacturer's information card. The card has information about your port. Keep this card with you at all times. Take care of the port as told by your health care provider. Ask your health care provider if you or a family member can get training for taking care of the port at home. A home health care nurse may also take care of the port. Make sure to remember what type of port you have. Incision care Follow instructions from your health care provider about how to take care of your port insertion site. Make sure you: Wash your hands with soap and water before and after you change your bandage (dressing). If soap and water are not available, use hand sanitizer. Change your dressing as told by your health care provider. Leave skin glue, or adhesive strips in place. These skin closures may need to stay in place for 2 weeks or longer.  Check your port insertion site every day for signs of infection. Check for:      - Redness, swelling, or pain.                     - Fluid or blood.      - Warmth.      - Pus or a bad  smell. Activity Return to your normal activities as told by your health care provider. Ask your health care provider what activities are safe for you. Do not lift anything that is heavier than 10 lb (4.5 kg), or the limit that you are told, until your health care provider says that it is safe. General instructions Take over-the-counter and prescription medicines only as told by your health care provider. Do not take baths, swim, or use a hot tub until your health care provider approves. Ask your health care provider if you may take showers. You may only be allowed to take sponge baths. Do not drive for 24 hours if you were given a sedative during your procedure. Wear a medical alert bracelet in case of an emergency. This will tell any health care providers that you have a port. Keep all follow-up visits as told by your health care provider. This is important. Contact a health care provider if: You cannot flush your port with saline as directed, or you cannot draw blood from the port. You have a fever or chills. You have redness, swelling, or pain around your port insertion site. You have fluid or blood coming from your port insertion site. Your port insertion site feels warm to the touch. You have pus or a bad smell coming from the port insertion site. Get help  right away if: You have chest pain or shortness of breath. You have bleeding from your port that you cannot control. Summary Take care of the port as told by your health care provider. Keep the manufacturer's information card with you at all times. Change your dressing as told by your health care provider. Contact a health care provider if you have a fever or chills or if you have redness, swelling, or pain around your port insertion site. Keep all follow-up visits as told by your health care provider. This information is not intended to replace advice given to you by your health care provider. Make sure you discuss any questions you  have with your healthcare provider. Document Revised: 08/08/2017 Document Reviewed: 08/08/2017  Moderate Conscious Sedation, Adult, Care After This sheet gives you information about how to care for yourself after your procedure. Your health care provider may also give you more specific instructions. If you have problems or questions, contact your health careprovider. What can I expect after the procedure? After the procedure, it is common to have: Sleepiness for several hours. Impaired judgment for several hours. Difficulty with balance. Vomiting if you eat too soon. Follow these instructions at home: For the time period you were told by your health care provider: Rest. Do not participate in activities where you could fall or become injured. Do not drive or use machinery. Do not drink alcohol. Do not take sleeping pills or medicines that cause drowsiness. Do not make important decisions or sign legal documents. Do not take care of children on your own. Eating and drinking  Follow the diet recommended by your health care provider. Drink enough fluid to keep your urine pale yellow. If you vomit: Drink water, juice, or soup when you can drink without vomiting. Make sure you have little or no nausea before eating solid foods.  General instructions Take over-the-counter and prescription medicines only as told by your health care provider. Have a responsible adult stay with you for the time you are told. It is important to have someone help care for you until you are awake and alert. Do not smoke. Keep all follow-up visits as told by your health care provider. This is important. Contact a health care provider if: You are still sleepy or having trouble with balance after 24 hours. You feel light-headed. You keep feeling nauseous or you keep vomiting. You develop a rash. You have a fever. You have redness or swelling around the IV site. Get help right away if: You have trouble  breathing. You have new-onset confusion at home. Summary After the procedure, it is common to feel sleepy, have impaired judgment, or feel nauseous if you eat too soon. Rest after you get home. Know the things you should not do after the procedure. Follow the diet recommended by your health care provider and drink enough fluid to keep your urine pale yellow. Get help right away if you have trouble breathing or new-onset confusion at home. This information is not intended to replace advice given to you by your health care provider. Make sure you discuss any questions you have with your healthcare provider. Document Revised: 05/10/2019 Document Reviewed: 12/06/2018 Elsevier Patient Education  2022 [employer].

## 2021-12-15 ENCOUNTER — Ambulatory Visit
Admission: RE | Admit: 2021-12-15 | Discharge: 2021-12-15 | Disposition: A | Discharge status: Home / Self Care | Payer: Medicare PPO | Source: Ambulatory Visit | Attending: Radiation Oncology | Admitting: Radiation Oncology

## 2021-12-15 ENCOUNTER — Other Ambulatory Visit: Payer: Self-pay

## 2021-12-15 DIAGNOSIS — C3431 Malignant neoplasm of lower lobe, right bronchus or lung: Secondary | ICD-10-CM | POA: Diagnosis not present

## 2021-12-15 DIAGNOSIS — Z510 Encounter for antineoplastic radiation therapy: Secondary | ICD-10-CM | POA: Diagnosis not present

## 2021-12-15 DIAGNOSIS — Z5111 Encounter for antineoplastic chemotherapy: Secondary | ICD-10-CM | POA: Diagnosis not present

## 2021-12-15 DIAGNOSIS — I10 Essential (primary) hypertension: Secondary | ICD-10-CM | POA: Diagnosis not present

## 2021-12-15 DIAGNOSIS — M549 Dorsalgia, unspecified: Secondary | ICD-10-CM | POA: Diagnosis not present

## 2021-12-15 DIAGNOSIS — R1310 Dysphagia, unspecified: Secondary | ICD-10-CM | POA: Diagnosis not present

## 2021-12-15 DIAGNOSIS — I480 Paroxysmal atrial fibrillation: Secondary | ICD-10-CM | POA: Diagnosis not present

## 2021-12-15 DIAGNOSIS — Z87891 Personal history of nicotine dependence: Secondary | ICD-10-CM | POA: Diagnosis not present

## 2021-12-15 DIAGNOSIS — Z853 Personal history of malignant neoplasm of breast: Secondary | ICD-10-CM | POA: Diagnosis not present

## 2021-12-15 DIAGNOSIS — Z902 Acquired absence of lung [part of]: Secondary | ICD-10-CM | POA: Diagnosis not present

## 2021-12-15 LAB — RAD ONC ARIA SESSION SUMMARY
Course Elapsed Days: 23
Plan Fractions Treated to Date: 19
Plan Prescribed Dose Per Fraction: 2 Gy
Plan Total Fractions Prescribed: 30
Plan Total Prescribed Dose: 60 Gy
Reference Point Dosage Given to Date: 38 Gy
Reference Point Session Dosage Given: 2 Gy
Session Number: 19

## 2021-12-17 MED FILL — Dexamethasone Sodium Phosphate Inj 100 MG/10ML: INTRAMUSCULAR | Qty: 1 | Status: AC

## 2021-12-20 ENCOUNTER — Other Ambulatory Visit: Payer: Self-pay

## 2021-12-20 ENCOUNTER — Inpatient Hospital Stay (HOSPITAL_BASED_OUTPATIENT_CLINIC_OR_DEPARTMENT_OTHER): Payer: Medicare PPO | Admitting: Physician Assistant

## 2021-12-20 ENCOUNTER — Inpatient Hospital Stay: Payer: Medicare PPO

## 2021-12-20 ENCOUNTER — Ambulatory Visit
Admission: RE | Admit: 2021-12-20 | Discharge: 2021-12-20 | Disposition: A | Discharge status: Home / Self Care | Payer: Medicare PPO | Source: Ambulatory Visit | Attending: Radiation Oncology | Admitting: Radiation Oncology

## 2021-12-20 VITALS — BP 112/61 | HR 100 | Temp 98.2°F | Resp 15 | Wt 149.5 lb

## 2021-12-20 DIAGNOSIS — Z87891 Personal history of nicotine dependence: Secondary | ICD-10-CM | POA: Diagnosis not present

## 2021-12-20 DIAGNOSIS — C3431 Malignant neoplasm of lower lobe, right bronchus or lung: Secondary | ICD-10-CM

## 2021-12-20 DIAGNOSIS — Z853 Personal history of malignant neoplasm of breast: Secondary | ICD-10-CM | POA: Diagnosis not present

## 2021-12-20 DIAGNOSIS — Z510 Encounter for antineoplastic radiation therapy: Secondary | ICD-10-CM | POA: Diagnosis not present

## 2021-12-20 DIAGNOSIS — R1310 Dysphagia, unspecified: Secondary | ICD-10-CM | POA: Diagnosis not present

## 2021-12-20 DIAGNOSIS — M549 Dorsalgia, unspecified: Secondary | ICD-10-CM | POA: Diagnosis not present

## 2021-12-20 DIAGNOSIS — I10 Essential (primary) hypertension: Secondary | ICD-10-CM | POA: Diagnosis not present

## 2021-12-20 DIAGNOSIS — Z5111 Encounter for antineoplastic chemotherapy: Secondary | ICD-10-CM | POA: Diagnosis not present

## 2021-12-20 DIAGNOSIS — Z902 Acquired absence of lung [part of]: Secondary | ICD-10-CM | POA: Diagnosis not present

## 2021-12-20 DIAGNOSIS — I480 Paroxysmal atrial fibrillation: Secondary | ICD-10-CM | POA: Diagnosis not present

## 2021-12-20 LAB — CBC WITH DIFFERENTIAL (CANCER CENTER ONLY)
Abs Immature Granulocytes: 0.03 10*3/uL (ref 0.00–0.07)
Basophils Absolute: 0.1 10*3/uL (ref 0.0–0.1)
Basophils Relative: 1 %
Eosinophils Absolute: 0 10*3/uL (ref 0.0–0.5)
Eosinophils Relative: 0 %
HCT: 35.6 % — ABNORMAL LOW (ref 36.0–46.0)
Hemoglobin: 12.2 g/dL (ref 12.0–15.0)
Immature Granulocytes: 0 %
Lymphocytes Relative: 10 %
Lymphs Abs: 0.8 10*3/uL (ref 0.7–4.0)
MCH: 32.3 pg (ref 26.0–34.0)
MCHC: 34.3 g/dL (ref 30.0–36.0)
MCV: 94.2 fL (ref 80.0–100.0)
Monocytes Absolute: 0.5 10*3/uL (ref 0.1–1.0)
Monocytes Relative: 6 %
Neutro Abs: 5.9 10*3/uL (ref 1.7–7.7)
Neutrophils Relative %: 83 %
Platelet Count: 243 10*3/uL (ref 150–400)
RBC: 3.78 MIL/uL — ABNORMAL LOW (ref 3.87–5.11)
RDW: 14 % (ref 11.5–15.5)
WBC Count: 7.2 10*3/uL (ref 4.0–10.5)
nRBC: 0 % (ref 0.0–0.2)

## 2021-12-20 LAB — RAD ONC ARIA SESSION SUMMARY
Course Elapsed Days: 28
Plan Fractions Treated to Date: 20
Plan Prescribed Dose Per Fraction: 2 Gy
Plan Total Fractions Prescribed: 30
Plan Total Prescribed Dose: 60 Gy
Reference Point Dosage Given to Date: 40 Gy
Reference Point Session Dosage Given: 2 Gy
Session Number: 20

## 2021-12-20 LAB — CMP (CANCER CENTER ONLY)
ALT: 18 U/L (ref 0–44)
AST: 14 U/L — ABNORMAL LOW (ref 15–41)
Albumin: 4.4 g/dL (ref 3.5–5.0)
Alkaline Phosphatase: 71 U/L (ref 38–126)
Anion gap: 8 (ref 5–15)
BUN: 31 mg/dL — ABNORMAL HIGH (ref 8–23)
CO2: 28 mmol/L (ref 22–32)
Calcium: 9.5 mg/dL (ref 8.9–10.3)
Chloride: 103 mmol/L (ref 98–111)
Creatinine: 1.23 mg/dL — ABNORMAL HIGH (ref 0.44–1.00)
GFR, Estimated: 44 mL/min — ABNORMAL LOW (ref >60)
Glucose, Bld: 132 mg/dL — ABNORMAL HIGH (ref 70–99)
Potassium: 3.3 mmol/L — ABNORMAL LOW (ref 3.5–5.1)
Sodium: 139 mmol/L (ref 135–145)
Total Bilirubin: 1.1 mg/dL (ref 0.3–1.2)
Total Protein: 7 g/dL (ref 6.5–8.1)

## 2021-12-20 MED ORDER — SODIUM CHLORIDE 0.9 % IV SOLN: Freq: Once | INTRAVENOUS | Status: AC

## 2021-12-20 MED ORDER — SODIUM CHLORIDE 0.9 % IV SOLN
130.0000 mg | Freq: Once | INTRAVENOUS | Status: AC
  Administered 2021-12-20: 130 mg via INTRAVENOUS
  Filled 2021-12-20: qty 13

## 2021-12-20 MED ORDER — SODIUM CHLORIDE 0.9% FLUSH
10.0000 mL | INTRAVENOUS | Status: DC | PRN
  Administered 2021-12-20: 10 mL

## 2021-12-20 MED ORDER — PALONOSETRON HCL INJECTION 0.25 MG/5ML
0.2500 mg | Freq: Once | INTRAVENOUS | Status: AC
  Administered 2021-12-20: 0.25 mg via INTRAVENOUS
  Filled 2021-12-20: qty 5

## 2021-12-20 MED ORDER — HEPARIN SOD (PORK) LOCK FLUSH 100 UNIT/ML IV SOLN
500.0000 [IU] | Freq: Once | INTRAVENOUS | Status: AC | PRN
  Administered 2021-12-20: 500 [IU]

## 2021-12-20 MED ORDER — SODIUM CHLORIDE 0.9 % IV SOLN
10.0000 mg | Freq: Once | INTRAVENOUS | Status: AC
  Administered 2021-12-20: 10 mg via INTRAVENOUS
  Filled 2021-12-20: qty 10

## 2021-12-20 MED ORDER — CETIRIZINE HCL 10 MG/ML IV SOLN
10.0000 mg | Freq: Once | INTRAVENOUS | Status: AC
  Administered 2021-12-20: 10 mg via INTRAVENOUS
  Filled 2021-12-20: qty 1

## 2021-12-20 MED ORDER — SODIUM CHLORIDE 0.9 % IV SOLN
45.0000 mg/m2 | Freq: Once | INTRAVENOUS | Status: AC
  Administered 2021-12-20: 78 mg via INTRAVENOUS
  Filled 2021-12-20: qty 13

## 2021-12-20 MED ORDER — FAMOTIDINE IN NACL 20-0.9 MG/50ML-% IV SOLN
20.0000 mg | Freq: Once | INTRAVENOUS | Status: AC
  Administered 2021-12-20: 20 mg via INTRAVENOUS
  Filled 2021-12-20: qty 50

## 2021-12-20 NOTE — Progress Notes (Signed)
Per Cassie ok to give calculated dose of carboplatin Larene Beach, PharmD

## 2021-12-21 ENCOUNTER — Other Ambulatory Visit: Payer: Self-pay

## 2021-12-21 ENCOUNTER — Ambulatory Visit
Admission: RE | Admit: 2021-12-21 | Discharge: 2021-12-21 | Disposition: A | Discharge status: Home / Self Care | Payer: Medicare PPO | Source: Ambulatory Visit | Attending: Radiation Oncology | Admitting: Radiation Oncology

## 2021-12-21 DIAGNOSIS — Z87891 Personal history of nicotine dependence: Secondary | ICD-10-CM | POA: Diagnosis not present

## 2021-12-21 DIAGNOSIS — Z902 Acquired absence of lung [part of]: Secondary | ICD-10-CM | POA: Diagnosis not present

## 2021-12-21 DIAGNOSIS — R1310 Dysphagia, unspecified: Secondary | ICD-10-CM | POA: Diagnosis not present

## 2021-12-21 DIAGNOSIS — I480 Paroxysmal atrial fibrillation: Secondary | ICD-10-CM | POA: Diagnosis not present

## 2021-12-21 DIAGNOSIS — Z853 Personal history of malignant neoplasm of breast: Secondary | ICD-10-CM | POA: Diagnosis not present

## 2021-12-21 DIAGNOSIS — Z5111 Encounter for antineoplastic chemotherapy: Secondary | ICD-10-CM | POA: Diagnosis not present

## 2021-12-21 DIAGNOSIS — C3431 Malignant neoplasm of lower lobe, right bronchus or lung: Secondary | ICD-10-CM | POA: Diagnosis not present

## 2021-12-21 DIAGNOSIS — Z510 Encounter for antineoplastic radiation therapy: Secondary | ICD-10-CM | POA: Diagnosis not present

## 2021-12-21 DIAGNOSIS — M549 Dorsalgia, unspecified: Secondary | ICD-10-CM | POA: Diagnosis not present

## 2021-12-21 DIAGNOSIS — I10 Essential (primary) hypertension: Secondary | ICD-10-CM | POA: Diagnosis not present

## 2021-12-21 LAB — RAD ONC ARIA SESSION SUMMARY
Course Elapsed Days: 29
Plan Fractions Treated to Date: 21
Plan Prescribed Dose Per Fraction: 2 Gy
Plan Total Fractions Prescribed: 30
Plan Total Prescribed Dose: 60 Gy
Reference Point Dosage Given to Date: 42 Gy
Reference Point Session Dosage Given: 2 Gy
Session Number: 21

## 2021-12-22 ENCOUNTER — Other Ambulatory Visit: Payer: Self-pay

## 2021-12-22 ENCOUNTER — Ambulatory Visit
Admission: RE | Admit: 2021-12-22 | Discharge: 2021-12-22 | Disposition: A | Discharge status: Home / Self Care | Payer: Medicare PPO | Source: Ambulatory Visit | Attending: Radiation Oncology | Admitting: Radiation Oncology

## 2021-12-22 DIAGNOSIS — C3431 Malignant neoplasm of lower lobe, right bronchus or lung: Secondary | ICD-10-CM | POA: Diagnosis not present

## 2021-12-22 DIAGNOSIS — I480 Paroxysmal atrial fibrillation: Secondary | ICD-10-CM | POA: Diagnosis not present

## 2021-12-22 DIAGNOSIS — I10 Essential (primary) hypertension: Secondary | ICD-10-CM | POA: Diagnosis not present

## 2021-12-22 DIAGNOSIS — Z5111 Encounter for antineoplastic chemotherapy: Secondary | ICD-10-CM | POA: Diagnosis not present

## 2021-12-22 DIAGNOSIS — M549 Dorsalgia, unspecified: Secondary | ICD-10-CM | POA: Diagnosis not present

## 2021-12-22 DIAGNOSIS — Z902 Acquired absence of lung [part of]: Secondary | ICD-10-CM | POA: Diagnosis not present

## 2021-12-22 DIAGNOSIS — Z87891 Personal history of nicotine dependence: Secondary | ICD-10-CM | POA: Diagnosis not present

## 2021-12-22 DIAGNOSIS — Z853 Personal history of malignant neoplasm of breast: Secondary | ICD-10-CM | POA: Diagnosis not present

## 2021-12-22 DIAGNOSIS — R1310 Dysphagia, unspecified: Secondary | ICD-10-CM | POA: Diagnosis not present

## 2021-12-22 DIAGNOSIS — Z510 Encounter for antineoplastic radiation therapy: Secondary | ICD-10-CM | POA: Diagnosis not present

## 2021-12-22 LAB — RAD ONC ARIA SESSION SUMMARY
Course Elapsed Days: 30
Plan Fractions Treated to Date: 22
Plan Prescribed Dose Per Fraction: 2 Gy
Plan Total Fractions Prescribed: 30
Plan Total Prescribed Dose: 60 Gy
Reference Point Dosage Given to Date: 44 Gy
Reference Point Session Dosage Given: 2 Gy
Session Number: 22

## 2021-12-23 ENCOUNTER — Ambulatory Visit
Admission: RE | Admit: 2021-12-23 | Discharge: 2021-12-23 | Disposition: A | Discharge status: Home / Self Care | Payer: Medicare PPO | Source: Ambulatory Visit | Attending: Radiation Oncology | Admitting: Radiation Oncology

## 2021-12-23 ENCOUNTER — Other Ambulatory Visit: Payer: Self-pay

## 2021-12-23 DIAGNOSIS — C3431 Malignant neoplasm of lower lobe, right bronchus or lung: Secondary | ICD-10-CM | POA: Diagnosis not present

## 2021-12-23 DIAGNOSIS — Z853 Personal history of malignant neoplasm of breast: Secondary | ICD-10-CM | POA: Diagnosis not present

## 2021-12-23 DIAGNOSIS — Z87891 Personal history of nicotine dependence: Secondary | ICD-10-CM | POA: Diagnosis not present

## 2021-12-23 DIAGNOSIS — Z902 Acquired absence of lung [part of]: Secondary | ICD-10-CM | POA: Diagnosis not present

## 2021-12-23 DIAGNOSIS — Z510 Encounter for antineoplastic radiation therapy: Secondary | ICD-10-CM | POA: Diagnosis not present

## 2021-12-23 DIAGNOSIS — I10 Essential (primary) hypertension: Secondary | ICD-10-CM | POA: Diagnosis not present

## 2021-12-23 DIAGNOSIS — M549 Dorsalgia, unspecified: Secondary | ICD-10-CM | POA: Diagnosis not present

## 2021-12-23 DIAGNOSIS — R1310 Dysphagia, unspecified: Secondary | ICD-10-CM | POA: Diagnosis not present

## 2021-12-23 DIAGNOSIS — Z5111 Encounter for antineoplastic chemotherapy: Secondary | ICD-10-CM | POA: Diagnosis not present

## 2021-12-23 DIAGNOSIS — I480 Paroxysmal atrial fibrillation: Secondary | ICD-10-CM | POA: Diagnosis not present

## 2021-12-23 LAB — RAD ONC ARIA SESSION SUMMARY
Course Elapsed Days: 31
Plan Fractions Treated to Date: 23
Plan Prescribed Dose Per Fraction: 2 Gy
Plan Total Fractions Prescribed: 30
Plan Total Prescribed Dose: 60 Gy
Reference Point Dosage Given to Date: 46 Gy
Reference Point Session Dosage Given: 2 Gy
Session Number: 23

## 2021-12-24 ENCOUNTER — Ambulatory Visit
Admission: RE | Admit: 2021-12-24 | Discharge: 2021-12-24 | Disposition: A | Discharge status: Home / Self Care | Payer: Medicare PPO | Source: Ambulatory Visit | Attending: Radiation Oncology | Admitting: Radiation Oncology

## 2021-12-24 ENCOUNTER — Other Ambulatory Visit: Payer: Self-pay

## 2021-12-24 DIAGNOSIS — M549 Dorsalgia, unspecified: Secondary | ICD-10-CM | POA: Insufficient documentation

## 2021-12-24 DIAGNOSIS — Z87891 Personal history of nicotine dependence: Secondary | ICD-10-CM | POA: Diagnosis not present

## 2021-12-24 DIAGNOSIS — C3431 Malignant neoplasm of lower lobe, right bronchus or lung: Secondary | ICD-10-CM | POA: Diagnosis not present

## 2021-12-24 DIAGNOSIS — Z5111 Encounter for antineoplastic chemotherapy: Secondary | ICD-10-CM | POA: Diagnosis not present

## 2021-12-24 DIAGNOSIS — Z510 Encounter for antineoplastic radiation therapy: Secondary | ICD-10-CM | POA: Insufficient documentation

## 2021-12-24 DIAGNOSIS — I480 Paroxysmal atrial fibrillation: Secondary | ICD-10-CM | POA: Insufficient documentation

## 2021-12-24 DIAGNOSIS — Z79899 Other long term (current) drug therapy: Secondary | ICD-10-CM | POA: Insufficient documentation

## 2021-12-24 DIAGNOSIS — R1310 Dysphagia, unspecified: Secondary | ICD-10-CM | POA: Insufficient documentation

## 2021-12-24 DIAGNOSIS — Z853 Personal history of malignant neoplasm of breast: Secondary | ICD-10-CM | POA: Insufficient documentation

## 2021-12-24 DIAGNOSIS — Z9221 Personal history of antineoplastic chemotherapy: Secondary | ICD-10-CM | POA: Diagnosis not present

## 2021-12-24 DIAGNOSIS — R059 Cough, unspecified: Secondary | ICD-10-CM | POA: Diagnosis not present

## 2021-12-24 DIAGNOSIS — Z7982 Long term (current) use of aspirin: Secondary | ICD-10-CM | POA: Insufficient documentation

## 2021-12-24 DIAGNOSIS — I10 Essential (primary) hypertension: Secondary | ICD-10-CM | POA: Insufficient documentation

## 2021-12-24 DIAGNOSIS — Z923 Personal history of irradiation: Secondary | ICD-10-CM | POA: Diagnosis not present

## 2021-12-24 DIAGNOSIS — Z902 Acquired absence of lung [part of]: Secondary | ICD-10-CM | POA: Insufficient documentation

## 2021-12-24 LAB — RAD ONC ARIA SESSION SUMMARY
Course Elapsed Days: 32
Plan Fractions Treated to Date: 24
Plan Prescribed Dose Per Fraction: 2 Gy
Plan Total Fractions Prescribed: 30
Plan Total Prescribed Dose: 60 Gy
Reference Point Dosage Given to Date: 48 Gy
Reference Point Session Dosage Given: 2 Gy
Session Number: 24

## 2021-12-24 MED FILL — Dexamethasone Sodium Phosphate Inj 100 MG/10ML: INTRAMUSCULAR | Qty: 1 | Status: AC

## 2021-12-27 ENCOUNTER — Other Ambulatory Visit: Payer: Medicare PPO

## 2021-12-27 ENCOUNTER — Ambulatory Visit
Admission: RE | Admit: 2021-12-27 | Discharge: 2021-12-27 | Disposition: A | Discharge status: Home / Self Care | Payer: Medicare PPO | Source: Ambulatory Visit | Attending: Radiation Oncology | Admitting: Radiation Oncology

## 2021-12-27 ENCOUNTER — Inpatient Hospital Stay: Payer: Medicare PPO

## 2021-12-27 ENCOUNTER — Inpatient Hospital Stay: Payer: Medicare PPO | Admitting: Internal Medicine

## 2021-12-27 ENCOUNTER — Other Ambulatory Visit: Payer: Self-pay

## 2021-12-27 ENCOUNTER — Inpatient Hospital Stay: Payer: Medicare PPO | Attending: Internal Medicine

## 2021-12-27 VITALS — BP 105/65 | HR 98 | Resp 16

## 2021-12-27 VITALS — BP 120/62 | HR 108 | Temp 97.9°F | Resp 18 | Wt 150.6 lb

## 2021-12-27 DIAGNOSIS — Z9221 Personal history of antineoplastic chemotherapy: Secondary | ICD-10-CM | POA: Insufficient documentation

## 2021-12-27 DIAGNOSIS — C3431 Malignant neoplasm of lower lobe, right bronchus or lung: Secondary | ICD-10-CM

## 2021-12-27 DIAGNOSIS — Z902 Acquired absence of lung [part of]: Secondary | ICD-10-CM | POA: Diagnosis not present

## 2021-12-27 DIAGNOSIS — I480 Paroxysmal atrial fibrillation: Secondary | ICD-10-CM | POA: Diagnosis not present

## 2021-12-27 DIAGNOSIS — C3490 Malignant neoplasm of unspecified part of unspecified bronchus or lung: Secondary | ICD-10-CM

## 2021-12-27 DIAGNOSIS — R059 Cough, unspecified: Secondary | ICD-10-CM | POA: Diagnosis not present

## 2021-12-27 DIAGNOSIS — Z79899 Other long term (current) drug therapy: Secondary | ICD-10-CM | POA: Insufficient documentation

## 2021-12-27 DIAGNOSIS — Z5111 Encounter for antineoplastic chemotherapy: Secondary | ICD-10-CM | POA: Insufficient documentation

## 2021-12-27 DIAGNOSIS — Z95828 Presence of other vascular implants and grafts: Secondary | ICD-10-CM

## 2021-12-27 DIAGNOSIS — Z87891 Personal history of nicotine dependence: Secondary | ICD-10-CM | POA: Diagnosis not present

## 2021-12-27 DIAGNOSIS — Z510 Encounter for antineoplastic radiation therapy: Secondary | ICD-10-CM | POA: Diagnosis not present

## 2021-12-27 DIAGNOSIS — I10 Essential (primary) hypertension: Secondary | ICD-10-CM | POA: Insufficient documentation

## 2021-12-27 DIAGNOSIS — Z7982 Long term (current) use of aspirin: Secondary | ICD-10-CM | POA: Insufficient documentation

## 2021-12-27 DIAGNOSIS — Z923 Personal history of irradiation: Secondary | ICD-10-CM | POA: Diagnosis not present

## 2021-12-27 LAB — CMP (CANCER CENTER ONLY)
ALT: 18 U/L (ref 0–44)
AST: 15 U/L (ref 15–41)
Albumin: 4.2 g/dL (ref 3.5–5.0)
Alkaline Phosphatase: 69 U/L (ref 38–126)
Anion gap: 8 (ref 5–15)
BUN: 26 mg/dL — ABNORMAL HIGH (ref 8–23)
CO2: 28 mmol/L (ref 22–32)
Calcium: 9.3 mg/dL (ref 8.9–10.3)
Chloride: 103 mmol/L (ref 98–111)
Creatinine: 1.13 mg/dL — ABNORMAL HIGH (ref 0.44–1.00)
GFR, Estimated: 49 mL/min — ABNORMAL LOW (ref >60)
Glucose, Bld: 128 mg/dL — ABNORMAL HIGH (ref 70–99)
Potassium: 3.6 mmol/L (ref 3.5–5.1)
Sodium: 139 mmol/L (ref 135–145)
Total Bilirubin: 1 mg/dL (ref 0.3–1.2)
Total Protein: 6.9 g/dL (ref 6.5–8.1)

## 2021-12-27 LAB — CBC WITH DIFFERENTIAL (CANCER CENTER ONLY)
Abs Immature Granulocytes: 0.02 10*3/uL (ref 0.00–0.07)
Basophils Absolute: 0 10*3/uL (ref 0.0–0.1)
Basophils Relative: 1 %
Eosinophils Absolute: 0 10*3/uL (ref 0.0–0.5)
Eosinophils Relative: 1 %
HCT: 32.9 % — ABNORMAL LOW (ref 36.0–46.0)
Hemoglobin: 11.4 g/dL — ABNORMAL LOW (ref 12.0–15.0)
Immature Granulocytes: 0 %
Lymphocytes Relative: 12 %
Lymphs Abs: 0.7 10*3/uL (ref 0.7–4.0)
MCH: 32.7 pg (ref 26.0–34.0)
MCHC: 34.7 g/dL (ref 30.0–36.0)
MCV: 94.3 fL (ref 80.0–100.0)
Monocytes Absolute: 0.4 10*3/uL (ref 0.1–1.0)
Monocytes Relative: 7 %
Neutro Abs: 4.2 10*3/uL (ref 1.7–7.7)
Neutrophils Relative %: 79 %
Platelet Count: 230 10*3/uL (ref 150–400)
RBC: 3.49 MIL/uL — ABNORMAL LOW (ref 3.87–5.11)
RDW: 14.8 % (ref 11.5–15.5)
WBC Count: 5.3 10*3/uL (ref 4.0–10.5)
nRBC: 0 % (ref 0.0–0.2)

## 2021-12-27 LAB — RAD ONC ARIA SESSION SUMMARY
Course Elapsed Days: 35
Plan Fractions Treated to Date: 25
Plan Prescribed Dose Per Fraction: 2 Gy
Plan Total Fractions Prescribed: 30
Plan Total Prescribed Dose: 60 Gy
Reference Point Dosage Given to Date: 50 Gy
Reference Point Session Dosage Given: 2 Gy
Session Number: 25

## 2021-12-27 MED ORDER — SODIUM CHLORIDE 0.9% FLUSH
10.0000 mL | INTRAVENOUS | Status: DC | PRN
  Administered 2021-12-27: 10 mL

## 2021-12-27 MED ORDER — HEPARIN SOD (PORK) LOCK FLUSH 100 UNIT/ML IV SOLN: 250.0000 [IU] | Freq: Once | INTRAVENOUS | Status: DC | PRN

## 2021-12-27 MED ORDER — PALONOSETRON HCL INJECTION 0.25 MG/5ML
0.2500 mg | Freq: Once | INTRAVENOUS | Status: AC
  Administered 2021-12-27: 0.25 mg via INTRAVENOUS
  Filled 2021-12-27: qty 5

## 2021-12-27 MED ORDER — LOPERAMIDE HCL 2 MG PO CAPS
4.0000 mg | ORAL_CAPSULE | Freq: Once | ORAL | Status: AC
  Administered 2021-12-27: 4 mg via ORAL
  Filled 2021-12-27: qty 2

## 2021-12-27 MED ORDER — HEPARIN SOD (PORK) LOCK FLUSH 100 UNIT/ML IV SOLN
500.0000 [IU] | Freq: Once | INTRAVENOUS | Status: AC | PRN
  Administered 2021-12-27: 500 [IU]

## 2021-12-27 MED ORDER — SODIUM CHLORIDE 0.9 % IV SOLN: INTRAVENOUS | Status: AC

## 2021-12-27 MED ORDER — FAMOTIDINE IN NACL 20-0.9 MG/50ML-% IV SOLN
20.0000 mg | Freq: Once | INTRAVENOUS | Status: AC
  Administered 2021-12-27: 20 mg via INTRAVENOUS
  Filled 2021-12-27: qty 50

## 2021-12-27 MED ORDER — SODIUM CHLORIDE 0.9 % IV SOLN: Freq: Once | INTRAVENOUS | Status: AC

## 2021-12-27 MED ORDER — ALTEPLASE 2 MG IJ SOLR: 2.0000 mg | Freq: Once | INTRAMUSCULAR | Status: DC | PRN

## 2021-12-27 MED ORDER — SODIUM CHLORIDE 0.9 % IV SOLN
114.0000 mg | Freq: Once | INTRAVENOUS | Status: AC
  Administered 2021-12-27: 110 mg via INTRAVENOUS
  Filled 2021-12-27: qty 11

## 2021-12-27 MED ORDER — CETIRIZINE HCL 10 MG/ML IV SOLN
10.0000 mg | Freq: Once | INTRAVENOUS | Status: AC
  Administered 2021-12-27: 10 mg via INTRAVENOUS
  Filled 2021-12-27: qty 1

## 2021-12-27 MED ORDER — SODIUM CHLORIDE 0.9% FLUSH
10.0000 mL | INTRAVENOUS | Status: AC | PRN
  Administered 2021-12-27: 10 mL

## 2021-12-27 MED ORDER — SODIUM CHLORIDE 0.9% FLUSH: 3.0000 mL | INTRAVENOUS | Status: DC | PRN

## 2021-12-27 MED ORDER — SODIUM CHLORIDE 0.9 % IV SOLN
45.0000 mg/m2 | Freq: Once | INTRAVENOUS | Status: AC
  Administered 2021-12-27: 78 mg via INTRAVENOUS
  Filled 2021-12-27: qty 13

## 2021-12-27 MED ORDER — SODIUM CHLORIDE 0.9 % IV SOLN
10.0000 mg | Freq: Once | INTRAVENOUS | Status: AC
  Administered 2021-12-27: 10 mg via INTRAVENOUS
  Filled 2021-12-27: qty 10

## 2021-12-27 NOTE — Progress Notes (Signed)
[facility] [phone]   [phone]  OFFICE PROGRESS NOTE  [name], MD [address] [place] [address]  DIAGNOSIS: Recurrent non-small cell lung cancer presented as a stage IIIa (T1c, N2, M0) non-small cell lung cancer, adenocarcinoma initially diagnosed as T1a (T1c, N0, M0) non-small cell lung cancer, adenocarcinoma presented with right lower lobe lung nodule in June 2020.  PRIOR THERAPY: Right video-assisted thoracoscopy, Wedge resection right lower lobe nodule, Thoracoscopic right lower lobectomy, Lymph node dissection on July 26, 2018 under the care of Dr. Roxan Hockey  CURRENT THERAPY: Concurrent chemoradiation with weekly carboplatin for AUC of 2 and paclitaxel 45 Mg/M2.  First dose November 22, 2021.  Status post 5 cycles.  INTERVAL HISTORY: Vanessa Day 81 y.o. female returns to the clinic today for follow-up visit accompanied by her granddaughter Vanessa Day.  The patient is feeling fine today with no concerning complaints.  She has been tolerating her concurrent chemoradiation fairly well.  She denied having any current chest pain, shortness of breath, cough or hemoptysis.  She has no nausea, vomiting, diarrhea or constipation.  She has no headache or visual changes.  She denied having any recent weight loss or night sweats.  She is here today for evaluation before starting cycle #6 of her treatment.  MEDICAL HISTORY: Past Medical History:  Diagnosis Date   Adenocarcinoma of lung, stage 1, right ([facility]) 2020   Anxiety    Arthritis    back, feet, jaw - per patient "all over"   Breast cancer ([facility]) 2000   Right breast   Family history of breast cancer    GERD (gastroesophageal reflux disease)    Graves disease    H/O hiatal hernia    Hyperlipidemia    Hypertension    IBS (irritable bowel syndrome)    Mitral regurgitation    moderate mitral regurgitation 08/20/20 echo   Neck pain    PAF (paroxysmal atrial  fibrillation) ([facility])    ~ 09/2020 in setting of hyperthyroidism   PONV (postoperative nausea and vomiting)    Pre-diabetes     ALLERGIES:  is allergic to hydrocodone, codeine, dicyclomine hcl, hyoscyamine, nitrofurantoin, statins, sudafed [pseudoephedrine], oxycodone-acetaminophen, and sulfa antibiotics.  MEDICATIONS:  Current Outpatient Medications  Medication Sig Dispense Refill   acetaminophen (TYLENOL) 500 MG tablet Take 1 tablet (500 mg total) by mouth every 6 (six) hours as needed for mild pain. (Patient taking differently: Take 1,000 mg by mouth every 6 (six) hours as needed for mild pain.) 30 tablet 0   alosetron (LOTRONEX) 0.5 MG tablet Take 0.25 mg by mouth daily as needed (Diarrhea).     aspirin EC 81 MG tablet Take 1 tablet (81 mg total) by mouth daily. Swallow whole. 90 tablet 3   azelastine (ASTELIN) 0.1 % nasal spray Place 1 spray into both nostrils daily as needed (Congestion). Use in each nostril as directed     celecoxib (CELEBREX) 200 MG capsule Take 200 mg by mouth 2 (two) times daily.     cetirizine (ZYRTEC) 10 MG tablet Take 10 mg by mouth daily as needed for allergies.     ezetimibe (ZETIA) 10 MG tablet Take 10 mg by mouth daily.     hydrochlorothiazide (MICROZIDE) 12.5 MG capsule Take 1 capsule (12.5 mg total) by mouth daily. 45 capsule 3   lidocaine-prilocaine (EMLA) cream Apply 1 Application topically as needed. 30 g 2   LORazepam (ATIVAN) 1 MG tablet Take 1-2 mg by mouth  at bedtime as needed for sleep.     losartan (COZAAR) 100 MG tablet Take 100 mg by mouth daily.     methimazole (TAPAZOLE) 5 MG tablet Take 5 mg by mouth daily.     Polyethyl Glycol-Propyl Glycol (SYSTANE OP) Place 1 drop into both eyes 2 (two) times daily as needed (dry eyes).     prochlorperazine (COMPAZINE) 10 MG tablet Take 1 tablet (10 mg total) by mouth every 6 (six) hours as needed for nausea or vomiting. 30 tablet 0   sertraline (ZOLOFT) 50 MG tablet Take 50 mg by mouth at bedtime.      sucralfate (CARAFATE) 1 g tablet Take 1 tablet (1 g total) by mouth 4 (four) times daily -  with meals and at bedtime. Crush 1 tablet in 1 oz water and drink 5 min before meals for radiation induced esophagitis 120 tablet 2   No current facility-administered medications for this visit.    SURGICAL HISTORY:  Past Surgical History:  Procedure Laterality Date   ABDOMINAL HYSTERECTOMY     APPENDECTOMY     BACK SURGERY     BREAST BIOPSY     BREAST SURGERY     CARPOMETACARPEL SUSPENSION PLASTY Left 08/25/2015   Procedure: SUSPENSIONPLASTY LEFT THUMB TRAPEZIUM EXCISION ABDUCTOR POLLICIS LONGUS TRANSFER;  Surgeon: Daryll Brod, MD;  Location: [facility];  Service: Orthopedics;  Laterality: Left;   CHOLECYSTECTOMY     COLONOSCOPY     several   EYE SURGERY     cataract right and left eye   GAS INSERTION  06/23/2011   Procedure: INSERTION OF GAS;  Surgeon: Hayden Pedro, MD;  Location: [facility];  Service: Ophthalmology;  Laterality: Left;  SF6   IR IMAGING GUIDED PORT INSERTION  12/14/2021   LOBECTOMY Right 07/26/2018   Procedure: RIGHT LOWER LOBE LOBECTOMY;  Surgeon: Melrose Nakayama, MD;  Location: [facility];  Service: Thoracic;  Laterality: Right;   MASTECTOMY     right breast   SCLERAL BUCKLE  06/23/2011   Procedure: SCLERAL BUCKLE;  Surgeon: Hayden Pedro, MD;  Location: [facility];  Service: Ophthalmology;  Laterality: Left;   SEGMENTECOMY Right 07/26/2018   Procedure: SEGMENTECTOMY;  Surgeon: Melrose Nakayama, MD;  Location: [facility];  Service: Thoracic;  Laterality: Right;   TONSILLECTOMY     VIDEO ASSISTED THORACOSCOPY Right 07/26/2018   Procedure: VIDEO ASSISTED THORACOSCOPY;  Surgeon: Melrose Nakayama, MD;  Location: [facility];  Service: Thoracic;  Laterality: Right;   VIDEO BRONCHOSCOPY WITH ENDOBRONCHIAL ULTRASOUND N/A 10/27/2021   Procedure: VIDEO BRONCHOSCOPY WITH ENDOBRONCHIAL ULTRASOUND;  Surgeon: Melrose Nakayama, MD;  Location: [facility];  Service: Thoracic;   Laterality: N/A;    REVIEW OF SYSTEMS:  A comprehensive review of systems was negative except for: Constitutional: positive for fatigue   PHYSICAL EXAMINATION: General appearance: alert, cooperative, and no distress Head: Normocephalic, without obvious abnormality, atraumatic Neck: no adenopathy, no JVD, supple, symmetrical, trachea midline, and thyroid not enlarged, symmetric, no tenderness/mass/nodules Lymph nodes: Cervical, supraclavicular, and axillary nodes normal. Resp: clear to auscultation bilaterally Back: symmetric, no curvature. ROM normal. No CVA tenderness. Cardio: regular rate and rhythm, S1, S2 normal, no murmur, click, rub or gallop GI: soft, non-tender; bowel sounds normal; no masses,  no organomegaly Extremities: extremities normal, atraumatic, no cyanosis or edema  ECOG PERFORMANCE STATUS: 1 - Symptomatic but completely ambulatory   Blood pressure 120/62, pulse (!) 108, temperature 97.9 F (36.6 C), temperature source Oral, resp. rate 18, weight 150  lb 9 oz (68.3 kg), SpO2 100 %.  LABORATORY DATA: Lab Results  Component Value Date   WBC 5.3 12/27/2021   HGB 11.4 (L) 12/27/2021   HCT 32.9 (L) 12/27/2021   MCV 94.3 12/27/2021   PLT 230 12/27/2021      Chemistry      Component Value Date/Time   NA 139 12/27/2021 1037   K 3.6 12/27/2021 1037   CL 103 12/27/2021 1037   CO2 28 12/27/2021 1037   BUN 26 (H) 12/27/2021 1037   CREATININE 1.13 (H) 12/27/2021 1037      Component Value Date/Time   CALCIUM 9.3 12/27/2021 1037   ALKPHOS 69 12/27/2021 1037   AST 15 12/27/2021 1037   ALT 18 12/27/2021 1037   BILITOT 1.0 12/27/2021 1037       RADIOGRAPHIC STUDIES: IR IMAGING GUIDED PORT INSERTION  Result Date: 12/14/2021 INDICATION: 81 year old female referred for port catheter EXAM: IMAGE GUIDED PORT CATHETER MEDICATIONS: None ANESTHESIA/SEDATION: Moderate (conscious) sedation was employed during this procedure. A total of Versed 2 mg and Fentanyl 100 mcg was  administered intravenously. Moderate Sedation Time: 21 minutes. The patient's level of consciousness and vital signs were monitored continuously by radiology nursing throughout the procedure under my direct supervision. FLUOROSCOPY TIME:  Fluoroscopy Time: 0 minutes 12 seconds (2 mGy). COMPLICATIONS: None PROCEDURE: Informed written consent was obtained from the patient after a thorough discussion of the procedural risks, benefits and alternatives. All questions were addressed. Maximal Sterile Barrier Technique was utilized including caps, mask, sterile gowns, sterile gloves, sterile drape, hand hygiene and skin antiseptic. A timeout was performed prior to the initiation of the procedure. Ultrasound survey was performed with images stored and sent to PACs. Right IJ vein documented to be patent. The right neck and chest was prepped with chlorhexidine, and draped in the usual sterile fashion using maximum barrier technique (cap and mask, sterile gown, sterile gloves, large sterile sheet, hand hygiene and cutaneous antiseptic). Local anesthesia was attained by infiltration with 1% lidocaine without epinephrine. Ultrasound demonstrated patency of the right internal jugular vein, and this was documented with an image. Under real-time ultrasound guidance, this vein was accessed with a 21 gauge micropuncture needle and image documentation was performed. A small dermatotomy was made at the access site with an 11 scalpel. A 0.018" wire was advanced into the SVC and used to estimate the length of the internal catheter. The access needle exchanged for a 57F micropuncture vascular sheath. The 0.018" wire was then removed and a 0.035" wire advanced into the IVC. An appropriate location for the subcutaneous reservoir was selected below the clavicle and an incision was made through the skin and underlying soft tissues. The subcutaneous tissues were then dissected using a combination of blunt and sharp surgical technique and a  pocket was formed. A single lumen power injectable portacatheter was then tunneled through the subcutaneous tissues from the pocket to the dermatotomy and the port reservoir placed within the subcutaneous pocket. The venous access site was then serially dilated and a peel away vascular sheath placed over the wire. The wire was removed and the port catheter advanced into position under fluoroscopic guidance. The catheter tip is positioned in the cavoatrial junction. This was documented with a spot image. The portacatheter was then tested and found to flush and aspirate well. The port was flushed with saline followed by 100 units/mL heparinized saline. The pocket was then closed in two layers using first subdermal inverted interrupted absorbable sutures followed by a running  subcuticular suture. The epidermis was then sealed with Dermabond. The dermatotomy at the venous access site was also seal with Dermabond. Patient tolerated the procedure well and remained hemodynamically stable throughout. No complications encountered and no significant blood loss encountered IMPRESSION: Status post right IJ port catheter placement. Signed, Dulcy Fanny. Nadene Rubins, RPVI [facility] Radiology Electronically Signed   By: Corrie Mckusick D.O.   On: 12/14/2021 15:30     ASSESSMENT AND PLAN: This is a very pleasant 81 years old white female with stage IIIA (T1c, N2, M0) non-small cell lung cancer, adenocarcinoma initially diagnosed as diagnosed with a stage IA (T1c, N0, M0) non-small cell lung cancer, adenocarcinoma status post right lower lobectomy with lymph node dissection in July 2020.  She had disease recurrence in October 2023. The patient is currently on observation and she is feeling fine except for the dry cough. Her CT scan of the chest followed by PET scan showed increasing nodularity of the pleural surface in the right chest with ill-defined areas in the cardiophrenic  recess and increasing size of lymph nodes in the mediastinum. The patient underwent bronchoscopy with EBUS under the care of Dr. 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 The patient started a course of concurrent chemoradiation with weekly carboplatin for AUC of 2 and paclitaxel 45 Mg/M2 status post 5 cycles.  She has been tolerating this treatment well with no concerning adverse effects. I recommended for her to proceed with cycle #6 today as planned. I will see her back for follow-up visit in 5 weeks for evaluation with repeat CT scan of the chest for restaging of her disease. The patient was advised to call immediately if she has any concerning symptoms in the interval. The patient voices understanding of current disease status and treatment options and is in agreement with the current care plan. All questions were answered. The patient knows to call the clinic with any problems, questions or concerns. We can certainly see the patient much sooner if necessary.  Disclaimer: This note was dictated with voice recognition software. Similar sounding words can inadvertently be transcribed and may not be corrected upon review.

## 2021-12-27 NOTE — Patient Instructions (Signed)
[facility]  Discharge Instructions: Thank you for choosing [facility] to provide your oncology and hematology care.   If you have a lab appointment with the [facility], please go directly to the [facility] and check in at the registration area.   Wear comfortable clothing and clothing appropriate for easy access to any Portacath or PICC line.   We strive to give you quality time with your provider. You may need to reschedule your appointment if you arrive late (15 or more minutes).  Arriving late affects you and other patients whose appointments are after yours.  Also, if you miss three or more appointments without notifying the office, you may be dismissed from the clinic at the provider's discretion.      For prescription refill requests, have your pharmacy contact our office and allow 72 hours for refills to be completed.    Today you received the following chemotherapy and/or immunotherapy agents: Taxol, Carbo.       To help prevent nausea and vomiting after your treatment, we encourage you to take your nausea medication as directed.  BELOW ARE SYMPTOMS THAT SHOULD BE REPORTED IMMEDIATELY: *FEVER GREATER THAN 100.4 F (38 C) OR HIGHER *CHILLS OR SWEATING *NAUSEA AND VOMITING THAT IS NOT CONTROLLED WITH YOUR NAUSEA MEDICATION *UNUSUAL SHORTNESS OF BREATH *UNUSUAL BRUISING OR BLEEDING *URINARY PROBLEMS (pain or burning when urinating, or frequent urination) *BOWEL PROBLEMS (unusual diarrhea, constipation, pain near the anus) TENDERNESS IN MOUTH AND THROAT WITH OR WITHOUT PRESENCE OF ULCERS (sore throat, sores in mouth, or a toothache) UNUSUAL RASH, SWELLING OR PAIN  UNUSUAL VAGINAL DISCHARGE OR ITCHING   Items with * indicate a potential emergency and should be followed up as soon as possible or go to the Emergency Department if any problems should occur.  Please show the CHEMOTHERAPY ALERT CARD or IMMUNOTHERAPY ALERT CARD at  check-in to the Emergency Department and triage nurse.  Should you have questions after your visit or need to cancel or reschedule your appointment, please contact [facility]  Dept: [phone]  and follow the prompts.  Office hours are 8:00 a.m. to 4:30 p.m. Monday - Friday. Please note that voicemails left after 4:00 p.m. may not be returned until the following business day.  We are closed weekends and major holidays. You have access to a nurse at all times for urgent questions. Please call the main number to the clinic Dept: [phone] and follow the prompts.   For any non-urgent questions, you may also contact your provider using MyChart. We now offer e-Visits for anyone 25 and older to request care online for non-urgent symptoms. For details visit [email or web address].   Also download the MyChart app! Go to the app store, search "MyChart", open the app, select [facility], and log in with your MyChart username and password.  Masks are optional in the cancer centers. If you would like for your care team to wear a mask while they are taking care of you, please let them know. You may have one support person who is at least 82 years old accompany you for your appointments.

## 2021-12-27 NOTE — Progress Notes (Signed)
Per MD, ok to treat with elevated HR today. Give 4mg  Immodium and 500cc normal saline for mild diarrhea.

## 2021-12-28 ENCOUNTER — Ambulatory Visit
Admission: RE | Admit: 2021-12-28 | Discharge: 2021-12-28 | Disposition: A | Discharge status: Home / Self Care | Payer: Medicare PPO | Source: Ambulatory Visit | Attending: Radiation Oncology | Admitting: Radiation Oncology

## 2021-12-28 ENCOUNTER — Other Ambulatory Visit: Payer: Self-pay

## 2021-12-28 DIAGNOSIS — Z510 Encounter for antineoplastic radiation therapy: Secondary | ICD-10-CM | POA: Diagnosis not present

## 2021-12-28 DIAGNOSIS — Z5111 Encounter for antineoplastic chemotherapy: Secondary | ICD-10-CM | POA: Diagnosis not present

## 2021-12-28 DIAGNOSIS — Z87891 Personal history of nicotine dependence: Secondary | ICD-10-CM | POA: Diagnosis not present

## 2021-12-28 DIAGNOSIS — C3431 Malignant neoplasm of lower lobe, right bronchus or lung: Secondary | ICD-10-CM | POA: Diagnosis not present

## 2021-12-28 DIAGNOSIS — Z9221 Personal history of antineoplastic chemotherapy: Secondary | ICD-10-CM | POA: Diagnosis not present

## 2021-12-28 DIAGNOSIS — R059 Cough, unspecified: Secondary | ICD-10-CM | POA: Diagnosis not present

## 2021-12-28 DIAGNOSIS — I10 Essential (primary) hypertension: Secondary | ICD-10-CM | POA: Diagnosis not present

## 2021-12-28 DIAGNOSIS — Z902 Acquired absence of lung [part of]: Secondary | ICD-10-CM | POA: Diagnosis not present

## 2021-12-28 DIAGNOSIS — Z923 Personal history of irradiation: Secondary | ICD-10-CM | POA: Diagnosis not present

## 2021-12-28 DIAGNOSIS — I480 Paroxysmal atrial fibrillation: Secondary | ICD-10-CM | POA: Diagnosis not present

## 2021-12-28 LAB — RAD ONC ARIA SESSION SUMMARY
Course Elapsed Days: 36
Plan Fractions Treated to Date: 26
Plan Prescribed Dose Per Fraction: 2 Gy
Plan Total Fractions Prescribed: 30
Plan Total Prescribed Dose: 60 Gy
Reference Point Dosage Given to Date: 52 Gy
Reference Point Session Dosage Given: 2 Gy
Session Number: 26

## 2021-12-29 ENCOUNTER — Other Ambulatory Visit: Payer: Self-pay

## 2021-12-29 ENCOUNTER — Ambulatory Visit
Admission: RE | Admit: 2021-12-29 | Discharge: 2021-12-29 | Disposition: A | Discharge status: Home / Self Care | Payer: Medicare PPO | Source: Ambulatory Visit | Attending: Radiation Oncology | Admitting: Radiation Oncology

## 2021-12-29 DIAGNOSIS — Z9221 Personal history of antineoplastic chemotherapy: Secondary | ICD-10-CM | POA: Diagnosis not present

## 2021-12-29 DIAGNOSIS — Z902 Acquired absence of lung [part of]: Secondary | ICD-10-CM | POA: Diagnosis not present

## 2021-12-29 DIAGNOSIS — R059 Cough, unspecified: Secondary | ICD-10-CM | POA: Diagnosis not present

## 2021-12-29 DIAGNOSIS — I10 Essential (primary) hypertension: Secondary | ICD-10-CM | POA: Diagnosis not present

## 2021-12-29 DIAGNOSIS — Z923 Personal history of irradiation: Secondary | ICD-10-CM | POA: Diagnosis not present

## 2021-12-29 DIAGNOSIS — Z5111 Encounter for antineoplastic chemotherapy: Secondary | ICD-10-CM | POA: Diagnosis not present

## 2021-12-29 DIAGNOSIS — C3431 Malignant neoplasm of lower lobe, right bronchus or lung: Secondary | ICD-10-CM | POA: Diagnosis not present

## 2021-12-29 DIAGNOSIS — Z87891 Personal history of nicotine dependence: Secondary | ICD-10-CM | POA: Diagnosis not present

## 2021-12-29 DIAGNOSIS — I480 Paroxysmal atrial fibrillation: Secondary | ICD-10-CM | POA: Diagnosis not present

## 2021-12-29 DIAGNOSIS — Z510 Encounter for antineoplastic radiation therapy: Secondary | ICD-10-CM | POA: Diagnosis not present

## 2021-12-29 LAB — RAD ONC ARIA SESSION SUMMARY
Course Elapsed Days: 37
Plan Fractions Treated to Date: 27
Plan Prescribed Dose Per Fraction: 2 Gy
Plan Total Fractions Prescribed: 30
Plan Total Prescribed Dose: 60 Gy
Reference Point Dosage Given to Date: 54 Gy
Reference Point Session Dosage Given: 2 Gy
Session Number: 27

## 2021-12-30 ENCOUNTER — Ambulatory Visit
Admission: RE | Admit: 2021-12-30 | Discharge: 2021-12-30 | Disposition: A | Discharge status: Home / Self Care | Payer: Medicare PPO | Source: Ambulatory Visit | Attending: Radiation Oncology | Admitting: Radiation Oncology

## 2021-12-30 ENCOUNTER — Other Ambulatory Visit: Payer: Self-pay

## 2021-12-30 DIAGNOSIS — C3431 Malignant neoplasm of lower lobe, right bronchus or lung: Secondary | ICD-10-CM | POA: Diagnosis not present

## 2021-12-30 DIAGNOSIS — Z510 Encounter for antineoplastic radiation therapy: Secondary | ICD-10-CM | POA: Diagnosis not present

## 2021-12-30 DIAGNOSIS — Z5111 Encounter for antineoplastic chemotherapy: Secondary | ICD-10-CM | POA: Diagnosis not present

## 2021-12-30 DIAGNOSIS — Z87891 Personal history of nicotine dependence: Secondary | ICD-10-CM | POA: Diagnosis not present

## 2021-12-30 DIAGNOSIS — Z902 Acquired absence of lung [part of]: Secondary | ICD-10-CM | POA: Diagnosis not present

## 2021-12-30 DIAGNOSIS — I10 Essential (primary) hypertension: Secondary | ICD-10-CM | POA: Diagnosis not present

## 2021-12-30 DIAGNOSIS — I480 Paroxysmal atrial fibrillation: Secondary | ICD-10-CM | POA: Diagnosis not present

## 2021-12-30 DIAGNOSIS — R059 Cough, unspecified: Secondary | ICD-10-CM | POA: Diagnosis not present

## 2021-12-30 DIAGNOSIS — Z923 Personal history of irradiation: Secondary | ICD-10-CM | POA: Diagnosis not present

## 2021-12-30 DIAGNOSIS — Z9221 Personal history of antineoplastic chemotherapy: Secondary | ICD-10-CM | POA: Diagnosis not present

## 2021-12-30 LAB — RAD ONC ARIA SESSION SUMMARY
Course Elapsed Days: 38
Plan Fractions Treated to Date: 28
Plan Prescribed Dose Per Fraction: 2 Gy
Plan Total Fractions Prescribed: 30
Plan Total Prescribed Dose: 60 Gy
Reference Point Dosage Given to Date: 56 Gy
Reference Point Session Dosage Given: 2 Gy
Session Number: 28

## 2021-12-31 ENCOUNTER — Ambulatory Visit
Admission: RE | Admit: 2021-12-31 | Discharge: 2021-12-31 | Disposition: A | Discharge status: Home / Self Care | Payer: Medicare PPO | Source: Ambulatory Visit | Attending: Radiation Oncology | Admitting: Radiation Oncology

## 2021-12-31 ENCOUNTER — Other Ambulatory Visit: Payer: Self-pay

## 2021-12-31 DIAGNOSIS — R059 Cough, unspecified: Secondary | ICD-10-CM | POA: Diagnosis not present

## 2021-12-31 DIAGNOSIS — I10 Essential (primary) hypertension: Secondary | ICD-10-CM | POA: Diagnosis not present

## 2021-12-31 DIAGNOSIS — Z923 Personal history of irradiation: Secondary | ICD-10-CM | POA: Diagnosis not present

## 2021-12-31 DIAGNOSIS — C3431 Malignant neoplasm of lower lobe, right bronchus or lung: Secondary | ICD-10-CM | POA: Diagnosis not present

## 2021-12-31 DIAGNOSIS — Z902 Acquired absence of lung [part of]: Secondary | ICD-10-CM | POA: Diagnosis not present

## 2021-12-31 DIAGNOSIS — Z510 Encounter for antineoplastic radiation therapy: Secondary | ICD-10-CM | POA: Diagnosis not present

## 2021-12-31 DIAGNOSIS — Z5111 Encounter for antineoplastic chemotherapy: Secondary | ICD-10-CM | POA: Diagnosis not present

## 2021-12-31 DIAGNOSIS — Z87891 Personal history of nicotine dependence: Secondary | ICD-10-CM | POA: Diagnosis not present

## 2021-12-31 DIAGNOSIS — Z9221 Personal history of antineoplastic chemotherapy: Secondary | ICD-10-CM | POA: Diagnosis not present

## 2021-12-31 DIAGNOSIS — I480 Paroxysmal atrial fibrillation: Secondary | ICD-10-CM | POA: Diagnosis not present

## 2021-12-31 LAB — RAD ONC ARIA SESSION SUMMARY
Course Elapsed Days: 39
Plan Fractions Treated to Date: 29
Plan Prescribed Dose Per Fraction: 2 Gy
Plan Total Fractions Prescribed: 30
Plan Total Prescribed Dose: 60 Gy
Reference Point Dosage Given to Date: 58 Gy
Reference Point Session Dosage Given: 2 Gy
Session Number: 29

## 2021-12-31 MED FILL — Dexamethasone Sodium Phosphate Inj 100 MG/10ML: INTRAMUSCULAR | Qty: 1 | Status: AC

## 2022-01-03 ENCOUNTER — Other Ambulatory Visit: Payer: Medicare PPO

## 2022-01-03 ENCOUNTER — Inpatient Hospital Stay: Payer: Medicare PPO

## 2022-01-03 ENCOUNTER — Other Ambulatory Visit: Payer: Self-pay

## 2022-01-03 ENCOUNTER — Encounter: Payer: Self-pay | Admitting: Radiation Oncology

## 2022-01-03 ENCOUNTER — Ambulatory Visit
Admission: RE | Admit: 2022-01-03 | Discharge: 2022-01-03 | Disposition: A | Discharge status: Home / Self Care | Payer: Medicare PPO | Source: Ambulatory Visit | Attending: Radiation Oncology | Admitting: Radiation Oncology

## 2022-01-03 ENCOUNTER — Inpatient Hospital Stay (HOSPITAL_BASED_OUTPATIENT_CLINIC_OR_DEPARTMENT_OTHER): Payer: Medicare PPO | Admitting: Internal Medicine

## 2022-01-03 VITALS — BP 100/62 | HR 93 | Temp 98.5°F | Resp 18

## 2022-01-03 DIAGNOSIS — C3431 Malignant neoplasm of lower lobe, right bronchus or lung: Secondary | ICD-10-CM

## 2022-01-03 DIAGNOSIS — Z510 Encounter for antineoplastic radiation therapy: Secondary | ICD-10-CM | POA: Diagnosis not present

## 2022-01-03 DIAGNOSIS — Z9221 Personal history of antineoplastic chemotherapy: Secondary | ICD-10-CM | POA: Diagnosis not present

## 2022-01-03 DIAGNOSIS — Z5111 Encounter for antineoplastic chemotherapy: Secondary | ICD-10-CM | POA: Diagnosis not present

## 2022-01-03 DIAGNOSIS — I10 Essential (primary) hypertension: Secondary | ICD-10-CM | POA: Diagnosis not present

## 2022-01-03 DIAGNOSIS — R059 Cough, unspecified: Secondary | ICD-10-CM | POA: Diagnosis not present

## 2022-01-03 DIAGNOSIS — Z923 Personal history of irradiation: Secondary | ICD-10-CM | POA: Diagnosis not present

## 2022-01-03 DIAGNOSIS — Z902 Acquired absence of lung [part of]: Secondary | ICD-10-CM | POA: Diagnosis not present

## 2022-01-03 DIAGNOSIS — Z87891 Personal history of nicotine dependence: Secondary | ICD-10-CM | POA: Diagnosis not present

## 2022-01-03 DIAGNOSIS — I480 Paroxysmal atrial fibrillation: Secondary | ICD-10-CM | POA: Diagnosis not present

## 2022-01-03 LAB — RAD ONC ARIA SESSION SUMMARY
Course Elapsed Days: 42
Plan Fractions Treated to Date: 30
Plan Prescribed Dose Per Fraction: 2 Gy
Plan Total Fractions Prescribed: 30
Plan Total Prescribed Dose: 60 Gy
Reference Point Dosage Given to Date: 60 Gy
Reference Point Session Dosage Given: 2 Gy
Session Number: 30

## 2022-01-03 LAB — CBC WITH DIFFERENTIAL (CANCER CENTER ONLY)
Abs Immature Granulocytes: 0.01 10*3/uL (ref 0.00–0.07)
Basophils Absolute: 0 10*3/uL (ref 0.0–0.1)
Basophils Relative: 1 %
Eosinophils Absolute: 0 10*3/uL (ref 0.0–0.5)
Eosinophils Relative: 1 %
HCT: 31.3 % — ABNORMAL LOW (ref 36.0–46.0)
Hemoglobin: 11 g/dL — ABNORMAL LOW (ref 12.0–15.0)
Immature Granulocytes: 0 %
Lymphocytes Relative: 12 %
Lymphs Abs: 0.5 10*3/uL — ABNORMAL LOW (ref 0.7–4.0)
MCH: 33.3 pg (ref 26.0–34.0)
MCHC: 35.1 g/dL (ref 30.0–36.0)
MCV: 94.8 fL (ref 80.0–100.0)
Monocytes Absolute: 0.4 10*3/uL (ref 0.1–1.0)
Monocytes Relative: 9 %
Neutro Abs: 3.4 10*3/uL (ref 1.7–7.7)
Neutrophils Relative %: 77 %
Platelet Count: 238 10*3/uL (ref 150–400)
RBC: 3.3 MIL/uL — ABNORMAL LOW (ref 3.87–5.11)
RDW: 15.4 % (ref 11.5–15.5)
WBC Count: 4.3 10*3/uL (ref 4.0–10.5)
nRBC: 0 % (ref 0.0–0.2)

## 2022-01-03 LAB — CMP (CANCER CENTER ONLY)
ALT: 15 U/L (ref 0–44)
AST: 14 U/L — ABNORMAL LOW (ref 15–41)
Albumin: 4 g/dL (ref 3.5–5.0)
Alkaline Phosphatase: 72 U/L (ref 38–126)
Anion gap: 9 (ref 5–15)
BUN: 34 mg/dL — ABNORMAL HIGH (ref 8–23)
CO2: 29 mmol/L (ref 22–32)
Calcium: 9.4 mg/dL (ref 8.9–10.3)
Chloride: 101 mmol/L (ref 98–111)
Creatinine: 1.28 mg/dL — ABNORMAL HIGH (ref 0.44–1.00)
GFR, Estimated: 42 mL/min — ABNORMAL LOW (ref >60)
Glucose, Bld: 142 mg/dL — ABNORMAL HIGH (ref 70–99)
Potassium: 3.6 mmol/L (ref 3.5–5.1)
Sodium: 139 mmol/L (ref 135–145)
Total Bilirubin: 1.1 mg/dL (ref 0.3–1.2)
Total Protein: 6.6 g/dL (ref 6.5–8.1)

## 2022-01-03 MED ORDER — SODIUM CHLORIDE 0.9 % IV SOLN
45.0000 mg/m2 | Freq: Once | INTRAVENOUS | Status: AC
  Administered 2022-01-03: 78 mg via INTRAVENOUS
  Filled 2022-01-03: qty 13

## 2022-01-03 MED ORDER — SODIUM CHLORIDE 0.9 % IV SOLN
10.0000 mg | Freq: Once | INTRAVENOUS | Status: AC
  Administered 2022-01-03: 10 mg via INTRAVENOUS
  Filled 2022-01-03: qty 10

## 2022-01-03 MED ORDER — SODIUM CHLORIDE 0.9 % IV SOLN: Freq: Once | INTRAVENOUS | Status: AC

## 2022-01-03 MED ORDER — FAMOTIDINE IN NACL 20-0.9 MG/50ML-% IV SOLN
20.0000 mg | Freq: Once | INTRAVENOUS | Status: AC
  Administered 2022-01-03: 20 mg via INTRAVENOUS
  Filled 2022-01-03: qty 50

## 2022-01-03 MED ORDER — PALONOSETRON HCL INJECTION 0.25 MG/5ML
0.2500 mg | Freq: Once | INTRAVENOUS | Status: AC
  Administered 2022-01-03: 0.25 mg via INTRAVENOUS
  Filled 2022-01-03: qty 5

## 2022-01-03 MED ORDER — SODIUM CHLORIDE 0.9% FLUSH
10.0000 mL | INTRAVENOUS | Status: DC | PRN
  Administered 2022-01-03: 10 mL

## 2022-01-03 MED ORDER — CETIRIZINE HCL 10 MG/ML IV SOLN
10.0000 mg | Freq: Once | INTRAVENOUS | Status: AC
  Administered 2022-01-03: 10 mg via INTRAVENOUS
  Filled 2022-01-03: qty 1

## 2022-01-03 MED ORDER — HEPARIN SOD (PORK) LOCK FLUSH 100 UNIT/ML IV SOLN
500.0000 [IU] | Freq: Once | INTRAVENOUS | Status: AC | PRN
  Administered 2022-01-03: 500 [IU]

## 2022-01-03 MED ORDER — SODIUM CHLORIDE 0.9 % IV SOLN
114.0000 mg | Freq: Once | INTRAVENOUS | Status: AC
  Administered 2022-01-03: 110 mg via INTRAVENOUS
  Filled 2022-01-03: qty 11

## 2022-01-03 NOTE — Patient Instructions (Signed)
[facility]COL[facility]  Discharge Instructions: Thank you for choosing [facility] to provide your oncology and hematology care.   If you have a lab appointment with the [facility], please go directly to the [facility] and check in at the registration area.   Wear comfortable clothing and clothing appropriate for easy access to any Portacath or PICC line.   We strive to give you quality time with your provider. You may need to reschedule your appointment if you arrive late (15 or more minutes).  Arriving late affects you and other patients whose appointments are after yours.  Also, if you miss three or more appointments without notifying the office, you may be dismissed from the clinic at the provider's discretion.      For prescription refill requests, have your pharmacy contact our office and allow 72 hours for refills to be completed.    Today you received the following chemotherapy and/or immunotherapy agents: Taxol, Carbo.       To help prevent nausea and vomiting after your treatment, we encourage you to take your nausea medication as directed.  BELOW ARE SYMPTOMS THAT SHOULD BE REPORTED IMMEDIATELY: *FEVER GREATER THAN 100.4 F (38 C) OR HIGHER *CHILLS OR SWEATING *NAUSEA AND VOMITING THAT IS NOT CONTROLLED WITH YOUR NAUSEA MEDICATION *UNUSUAL SHORTNESS OF BREATH *UNUSUAL BRUISING OR BLEEDING *URINARY PROBLEMS (pain or burning when urinating, or frequent urination) *BOWEL PROBLEMS (unusual diarrhea, constipation, pain near the anus) TENDERNESS IN MOUTH AND THROAT WITH OR WITHOUT PRESENCE OF ULCERS (sore throat, sores in mouth, or a toothache) UNUSUAL RASH, SWELLING OR PAIN  UNUSUAL VAGINAL DISCHARGE OR ITCHING   Items with * indicate a potential emergency and should be followed up as soon as possible or go to the Emergency Department if any problems should occur.  Please show the CHEMOTHERAPY ALERT CARD or IMMUNOTHERAPY ALERT CARD at  check-in to the Emergency Department and triage nurse.  Should you have questions after your visit or need to cancel or reschedule your appointment, please contact [facility]  Dept: [phone]  and follow the prompts.  Office hours are 8:00 a.m. to 4:30 p.m. Monday - Friday. Please note that voicemails left after 4:00 p.m. may not be returned until the following business day.  We are closed weekends and major holidays. You have access to a nurse at all times for urgent questions. Please call the main number to the clinic Dept: [phone] and follow the prompts.   For any non-urgent questions, you may also contact your provider using MyChart. We now offer e-Visits for anyone 82 and older to request care online for non-urgent symptoms. For details visit [email or web address].   Also download the MyChart app! Go to the app store, search "MyChart", open the app, select [facility], and log in with your MyChart username and password.  Masks are optional in the cancer centers. If you would like for your care team to wear a mask while they are taking care of you, please let them know. You may have one support person who is at least 82 years old accompany you for your appointments.

## 2022-01-03 NOTE — Progress Notes (Signed)
The patient  is currently being treated for Recurrent Progressive Stage IA3, pT1cN0M0, NSCLC, adenocarcinoma of RLL with chemoradiation.  I saw her prior to her radiation.  She has completed 29 of the planned 33 fractions with ongoing sick chemosensitization.  She has lost approximately 8 pounds since she started this regimen and has been struggling with esophagitis that has not been as helpful with Carafate.  She has found over-the-counter antacids to be of benefit.  She states that she is trying to take in oral fluids but does struggle with the volume because of the discomfort.  She states that over the weekend she felt faint in the shower.  She continues to take 2 antihypertensives, hydrochlorothiazide and losartan.  Her pressure today in the clinic was 110/69.  Her pulse was 108.  She is afebrile and her remaining vitals are stable including 99% O2 on room air.  We discussed the changes in her weight as this may have been causing a shift in her hemostasis which could explain her presyncopal event in the shower, we also discussed her lack of oral intake to support her intravascular volume.  We discussed that as she is going for systemic treatment today she will also receive fluids and her premeds which should help improve her stamina and make her feel better as well as increase her pressures to a normal level.  I encouraged her to share the vital signs from our clinic visit with her PCP and also take vitals at home as she has a blood pressure cuff and to report these values at multiple intervals to her PCP.  She may need to discontinue her blood pressure medication specifically her hydrochlorothiazide.  She is aware of this recommendation.  Otherwise we talked about the benefits of continuing radiotherapy for long-term gain and attempting to cure her cancer.  She is in agreement to continue and will also try and work on oral intake.  We talked about popsicles as a way to try and take in more liquids.  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  This should improve within the 2 weeks after she completes treatment.

## 2022-01-03 NOTE — Progress Notes (Signed)
[place] [phone]   [phone]  OFFICE PROGRESS NOTE  Vanessa Neer, MD [address] [place] [address]  DIAGNOSIS: Recurrent non-small cell lung cancer presented as a stage IIIa (T1c, N2, M0) non-small cell lung cancer, adenocarcinoma initially diagnosed as T1a (T1c, N0, M0) non-small cell lung cancer, adenocarcinoma presented with right lower lobe lung nodule in June 2020.  PRIOR THERAPY: Right video-assisted thoracoscopy, Wedge resection right lower lobe nodule, Thoracoscopic right lower lobectomy, Lymph node dissection on July 26, 2018 under the care of Dr. Roxan Hockey  CURRENT THERAPY: Concurrent chemoradiation with weekly carboplatin for AUC of 2 and paclitaxel 45 Mg/M2.  First dose November 22, 2021.  Status post 6 cycles.  INTERVAL HISTORY: Vanessa Day 81 y.o. female returns to the clinic today for follow-up visit accompanied by her husband.  The patient is feeling fine today with no concerning complaints except for passing out in the shower recently but she did not hurt herself.  She is taken several blood pressure medications and her blood pressure has been on the lower side recently.  She denied having any current chest pain, shortness of breath except with exertion with no cough or hemoptysis.  She has no nausea, vomiting, diarrhea or constipation.  She has no headache or visual changes.  She is here today for evaluation before starting cycle #7 which is the last cycle of her treatment.  MEDICAL HISTORY: Past Medical History:  Diagnosis Date   Adenocarcinoma of lung, stage 1, right ([place]) 2020   Anxiety    Arthritis    back, feet, jaw - per patient "all over"   Breast cancer ([place]) 2000   Right breast   Family history of breast cancer    GERD (gastroesophageal reflux disease)    Graves disease    H/O hiatal hernia    Hyperlipidemia    Hypertension    IBS (irritable bowel syndrome)    Mitral regurgitation     moderate mitral regurgitation 08/20/20 echo   Neck pain    PAF (paroxysmal atrial fibrillation) ([place])    ~ 09/2020 in setting of hyperthyroidism   PONV (postoperative nausea and vomiting)    Pre-diabetes     ALLERGIES:  is allergic to hydrocodone, codeine, dicyclomine hcl, hyoscyamine, nitrofurantoin, statins, sudafed [pseudoephedrine], oxycodone-acetaminophen, and sulfa antibiotics.  MEDICATIONS:  Current Outpatient Medications  Medication Sig Dispense Refill   acetaminophen (TYLENOL) 500 MG tablet Take 1 tablet (500 mg total) by mouth every 6 (six) hours as needed for mild pain. (Patient taking differently: Take 1,000 mg by mouth every 6 (six) hours as needed for mild pain.) 30 tablet 0   alosetron (LOTRONEX) 0.5 MG tablet Take 0.25 mg by mouth daily as needed (Diarrhea).     aspirin EC 81 MG tablet Take 1 tablet (81 mg total) by mouth daily. Swallow whole. 90 tablet 3   azelastine (ASTELIN) 0.1 % nasal spray Place 1 spray into both nostrils daily as needed (Congestion). Use in each nostril as directed     celecoxib (CELEBREX) 200 MG capsule Take 200 mg by mouth 2 (two) times daily.     cetirizine (ZYRTEC) 10 MG tablet Take 10 mg by mouth daily as needed for allergies.     ezetimibe (ZETIA) 10 MG tablet Take 10 mg by mouth daily.     hydrochlorothiazide (MICROZIDE) 12.5 MG capsule Take 1 capsule (12.5 mg total) by mouth daily. 45 capsule 3   lidocaine-prilocaine (EMLA) cream  Apply 1 Application topically as needed. 30 g 2   LORazepam (ATIVAN) 1 MG tablet Take 1-2 mg by mouth at bedtime as needed for sleep.     losartan (COZAAR) 100 MG tablet Take 100 mg by mouth daily.     methimazole (TAPAZOLE) 5 MG tablet Take 5 mg by mouth daily.     Polyethyl Glycol-Propyl Glycol (SYSTANE OP) Place 1 drop into both eyes 2 (two) times daily as needed (dry eyes).     prochlorperazine (COMPAZINE) 10 MG tablet Take 1 tablet (10 mg total) by mouth every 6 (six) hours as needed for nausea or vomiting. 30  tablet 0   sertraline (ZOLOFT) 50 MG tablet Take 50 mg by mouth at bedtime.     sucralfate (CARAFATE) 1 g tablet Take 1 tablet (1 g total) by mouth 4 (four) times daily -  with meals and at bedtime. Crush 1 tablet in 1 oz water and drink 5 min before meals for radiation induced esophagitis 120 tablet 2   No current facility-administered medications for this visit.    SURGICAL HISTORY:  Past Surgical History:  Procedure Laterality Date   ABDOMINAL HYSTERECTOMY     APPENDECTOMY     BACK SURGERY     BREAST BIOPSY     BREAST SURGERY     CARPOMETACARPEL SUSPENSION PLASTY Left 08/25/2015   Procedure: SUSPENSIONPLASTY LEFT THUMB TRAPEZIUM EXCISION ABDUCTOR POLLICIS LONGUS TRANSFER;  Surgeon: Daryll Brod, MD;  Location: [facility];  Service: Orthopedics;  Laterality: Left;   CHOLECYSTECTOMY     COLONOSCOPY     several   EYE SURGERY     cataract right and left eye   GAS INSERTION  06/23/2011   Procedure: INSERTION OF GAS;  Surgeon: Hayden Pedro, MD;  Location: [facility];  Service: Ophthalmology;  Laterality: Left;  SF6   IR IMAGING GUIDED PORT INSERTION  12/14/2021   LOBECTOMY Right 07/26/2018   Procedure: RIGHT LOWER LOBE LOBECTOMY;  Surgeon: Melrose Nakayama, MD;  Location: [facility];  Service: Thoracic;  Laterality: Right;   MASTECTOMY     right breast   SCLERAL BUCKLE  06/23/2011   Procedure: SCLERAL BUCKLE;  Surgeon: Hayden Pedro, MD;  Location: [facility];  Service: Ophthalmology;  Laterality: Left;   SEGMENTECOMY Right 07/26/2018   Procedure: SEGMENTECTOMY;  Surgeon: Melrose Nakayama, MD;  Location: [facility];  Service: Thoracic;  Laterality: Right;   TONSILLECTOMY     VIDEO ASSISTED THORACOSCOPY Right 07/26/2018   Procedure: VIDEO ASSISTED THORACOSCOPY;  Surgeon: Melrose Nakayama, MD;  Location: [facility];  Service: Thoracic;  Laterality: Right;   VIDEO BRONCHOSCOPY WITH ENDOBRONCHIAL ULTRASOUND N/A 10/27/2021   Procedure: VIDEO BRONCHOSCOPY WITH ENDOBRONCHIAL  ULTRASOUND;  Surgeon: Melrose Nakayama, MD;  Location: [facility];  Service: Thoracic;  Laterality: N/A;    REVIEW OF SYSTEMS:  A comprehensive review of systems was negative except for: Constitutional: positive for fatigue   PHYSICAL EXAMINATION: General appearance: alert, cooperative, and no distress Head: Normocephalic, without obvious abnormality, atraumatic Neck: no adenopathy, no JVD, supple, symmetrical, trachea midline, and thyroid not enlarged, symmetric, no tenderness/mass/nodules Lymph nodes: Cervical, supraclavicular, and axillary nodes normal. Resp: clear to auscultation bilaterally Back: symmetric, no curvature. ROM normal. No CVA tenderness. Cardio: regular rate and rhythm, S1, S2 normal, no murmur, click, rub or gallop GI: soft, non-tender; bowel sounds normal; no masses,  no organomegaly Extremities: extremities normal, atraumatic, no cyanosis or edema  ECOG PERFORMANCE STATUS: 1 - Symptomatic but completely ambulatory  Blood pressure 96/66, pulse 97, temperature 98.2 F (36.8 C), temperature source Oral, resp. rate 19, weight 147 lb (66.7 kg), SpO2 99 %.  LABORATORY DATA: Lab Results  Component Value Date   WBC 4.3 01/03/2022   HGB 11.0 (L) 01/03/2022   HCT 31.3 (L) 01/03/2022   MCV 94.8 01/03/2022   PLT 238 01/03/2022      Chemistry      Component Value Date/Time   NA 139 01/03/2022 0936   K 3.6 01/03/2022 0936   CL 101 01/03/2022 0936   CO2 29 01/03/2022 0936   BUN 34 (H) 01/03/2022 0936   CREATININE 1.28 (H) 01/03/2022 0936      Component Value Date/Time   CALCIUM 9.4 01/03/2022 0936   ALKPHOS 72 01/03/2022 0936   AST 14 (L) 01/03/2022 0936   ALT 15 01/03/2022 0936   BILITOT 1.1 01/03/2022 0936       RADIOGRAPHIC STUDIES: IR IMAGING GUIDED PORT INSERTION  Result Date: 12/14/2021 INDICATION: 81 year old female referred for port catheter EXAM: IMAGE GUIDED PORT CATHETER MEDICATIONS: None ANESTHESIA/SEDATION: Moderate (conscious) sedation was  employed during this procedure. A total of Versed 2 mg and Fentanyl 100 mcg was administered intravenously. Moderate Sedation Time: 21 minutes. The patient's level of consciousness and vital signs were monitored continuously by radiology nursing throughout the procedure under my direct supervision. FLUOROSCOPY TIME:  Fluoroscopy Time: 0 minutes 12 seconds (2 mGy). COMPLICATIONS: None PROCEDURE: Informed written consent was obtained from the patient after a thorough discussion of the procedural risks, benefits and alternatives. All questions were addressed. Maximal Sterile Barrier Technique was utilized including caps, mask, sterile gowns, sterile gloves, sterile drape, hand hygiene and skin antiseptic. A timeout was performed prior to the initiation of the procedure. Ultrasound survey was performed with images stored and sent to PACs. Right IJ vein documented to be patent. The right neck and chest was prepped with chlorhexidine, and draped in the usual sterile fashion using maximum barrier technique (cap and mask, sterile gown, sterile gloves, large sterile sheet, hand hygiene and cutaneous antiseptic). Local anesthesia was attained by infiltration with 1% lidocaine without epinephrine. Ultrasound demonstrated patency of the right internal jugular vein, and this was documented with an image. Under real-time ultrasound guidance, this vein was accessed with a 21 gauge micropuncture needle and image documentation was performed. A small dermatotomy was made at the access site with an 11 scalpel. A 0.018" wire was advanced into the SVC and used to estimate the length of the internal catheter. The access needle exchanged for a 19F micropuncture vascular sheath. The 0.018" wire was then removed and a 0.035" wire advanced into the IVC. An appropriate location for the subcutaneous reservoir was selected below the clavicle and an incision was made through the skin and underlying soft tissues. The subcutaneous tissues were then  dissected using a combination of blunt and sharp surgical technique and a pocket was formed. A single lumen power injectable portacatheter was then tunneled through the subcutaneous tissues from the pocket to the dermatotomy and the port reservoir placed within the subcutaneous pocket. The venous access site was then serially dilated and a peel away vascular sheath placed over the wire. The wire was removed and the port catheter advanced into position under fluoroscopic guidance. The catheter tip is positioned in the cavoatrial junction. This was documented with a spot image. The portacatheter was then tested and found to flush and aspirate well. The port was flushed with saline followed by 100 units/mL heparinized saline. The pocket  was then closed in two layers using first subdermal inverted interrupted absorbable sutures followed by a running subcuticular suture. The epidermis was then sealed with Dermabond. The dermatotomy at the venous access site was also seal with Dermabond. Patient tolerated the procedure well and remained hemodynamically stable throughout. No complications encountered and no significant blood loss encountered IMPRESSION: Status post right IJ port catheter placement. Signed, Dulcy Fanny. Nadene Rubins, RPVI [facility] Electronically Signed   By: Corrie Mckusick D.O.   On: 12/14/2021 15:30     ASSESSMENT AND PLAN: This is a very pleasant 81 years old white female with stage IIIA (T1c, N2, M0) non-small cell lung cancer, adenocarcinoma initially diagnosed as diagnosed with a stage IA (T1c, N0, M0) non-small cell lung cancer, adenocarcinoma status post right lower lobectomy with lymph node dissection in July 2020.  She had disease recurrence in October 2023. The patient is currently on observation and she is feeling fine except for the dry cough. Her CT scan of the chest followed by PET scan showed increasing nodularity of the  pleural surface in the right chest with ill-defined areas in the cardiophrenic recess and increasing size of lymph nodes in the mediastinum. The patient underwent bronchoscopy with EBUS under the care of Dr. 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 The patient started a course of concurrent chemoradiation with weekly carboplatin for AUC of 2 and paclitaxel 45 Mg/M2 status post 6 cycles.  She tolerated the last cycle of her treatment well with no concerning adverse effects. I recommended for her to proceed with cycle #7 today as planned.  She is also having the last fraction of radiotherapy on January 06, 2022. She will come back for follow-up visit in around 4 weeks with repeat CT scan of the chest for restaging of her disease. She was advised to call immediately if she has any other concerning symptoms in the interval. The patient voices understanding of current disease status and treatment options and is in agreement with the current care plan. All questions were answered. The patient knows to call the clinic with any problems, questions or concerns. We can certainly see the patient much sooner if necessary.  Disclaimer: This note was dictated with voice recognition software. Similar sounding words can inadvertently be transcribed and may not be corrected upon review.

## 2022-01-04 ENCOUNTER — Ambulatory Visit
Admission: RE | Admit: 2022-01-04 | Discharge: 2022-01-04 | Disposition: A | Discharge status: Home / Self Care | Payer: Medicare PPO | Source: Ambulatory Visit | Attending: Radiation Oncology | Admitting: Radiation Oncology

## 2022-01-04 ENCOUNTER — Ambulatory Visit: Payer: Medicare PPO

## 2022-01-04 ENCOUNTER — Other Ambulatory Visit: Payer: Self-pay

## 2022-01-04 DIAGNOSIS — C3431 Malignant neoplasm of lower lobe, right bronchus or lung: Secondary | ICD-10-CM | POA: Diagnosis not present

## 2022-01-04 DIAGNOSIS — I480 Paroxysmal atrial fibrillation: Secondary | ICD-10-CM | POA: Diagnosis not present

## 2022-01-04 DIAGNOSIS — Z510 Encounter for antineoplastic radiation therapy: Secondary | ICD-10-CM | POA: Diagnosis not present

## 2022-01-04 DIAGNOSIS — Z9221 Personal history of antineoplastic chemotherapy: Secondary | ICD-10-CM | POA: Diagnosis not present

## 2022-01-04 DIAGNOSIS — Z902 Acquired absence of lung [part of]: Secondary | ICD-10-CM | POA: Diagnosis not present

## 2022-01-04 DIAGNOSIS — Z5111 Encounter for antineoplastic chemotherapy: Secondary | ICD-10-CM | POA: Diagnosis not present

## 2022-01-04 DIAGNOSIS — Z923 Personal history of irradiation: Secondary | ICD-10-CM | POA: Diagnosis not present

## 2022-01-04 DIAGNOSIS — R059 Cough, unspecified: Secondary | ICD-10-CM | POA: Diagnosis not present

## 2022-01-04 DIAGNOSIS — I10 Essential (primary) hypertension: Secondary | ICD-10-CM | POA: Diagnosis not present

## 2022-01-04 DIAGNOSIS — Z87891 Personal history of nicotine dependence: Secondary | ICD-10-CM | POA: Diagnosis not present

## 2022-01-04 LAB — RAD ONC ARIA SESSION SUMMARY
Course Elapsed Days: 43
Plan Fractions Treated to Date: 1
Plan Prescribed Dose Per Fraction: 2 Gy
Plan Total Fractions Prescribed: 3
Plan Total Prescribed Dose: 6 Gy
Reference Point Dosage Given to Date: 2 Gy
Reference Point Session Dosage Given: 2 Gy
Session Number: 31

## 2022-01-04 NOTE — Progress Notes (Signed)
                                                                                                                                                            Patient Name: Vanessa Day MRN: [id number] DOB: 06-26-1940 Referring Physician: Curt Bears (Profile Not Attached) Date of Service: 01/06/2022 [facility]-,                                                         End Of Treatment Note  Diagnoses: C34.31-Malignant neoplasm of lower lobe, right bronchus or lung  Cancer Staging:  Recurrent Progressive Stage IA3, pT1cN0M0, NSCLC, adenocarcinoma of RLL     Intent: Curative  Radiation Treatment Dates: 11/22/2021 through 01/06/2022 Site Technique Total Dose (Gy) Dose per Fx (Gy) Completed Fx Beam Energies  Lung, Right: Lung_R 3D 60/60 2 30/30 6X  Lung, Right: Lung_R_Bst 3D 6/6 2 3/3 6X   Narrative: The patient tolerated radiation therapy relatively well. She lost about 8 pounds during therapy and developed esophagitis and fatigue, as well as hypotension. She was evaluated the last week of her treatment for her symptoms and it was felt she may need to make changes in her antihypertensives with her diminished PO intake, weight loss, and subsequent hypotension. She had improvement in blood pressures the last week of therapy after her infusion however.  Plan: The patient will receive a call in about one month from the radiation oncology department. She will continue follow up with Dr. Julien Nordmann as well.   ________________________________________________    Carola Rhine, [facility]

## 2022-01-05 ENCOUNTER — Ambulatory Visit
Admission: RE | Admit: 2022-01-05 | Discharge: 2022-01-05 | Disposition: A | Discharge status: Home / Self Care | Payer: Medicare PPO | Source: Ambulatory Visit | Attending: Radiation Oncology | Admitting: Radiation Oncology

## 2022-01-05 ENCOUNTER — Other Ambulatory Visit: Payer: Self-pay

## 2022-01-05 ENCOUNTER — Ambulatory Visit: Payer: Medicare PPO

## 2022-01-05 DIAGNOSIS — Z9221 Personal history of antineoplastic chemotherapy: Secondary | ICD-10-CM | POA: Diagnosis not present

## 2022-01-05 DIAGNOSIS — Z5111 Encounter for antineoplastic chemotherapy: Secondary | ICD-10-CM | POA: Diagnosis not present

## 2022-01-05 DIAGNOSIS — I10 Essential (primary) hypertension: Secondary | ICD-10-CM | POA: Diagnosis not present

## 2022-01-05 DIAGNOSIS — I480 Paroxysmal atrial fibrillation: Secondary | ICD-10-CM | POA: Diagnosis not present

## 2022-01-05 DIAGNOSIS — Z923 Personal history of irradiation: Secondary | ICD-10-CM | POA: Diagnosis not present

## 2022-01-05 DIAGNOSIS — R059 Cough, unspecified: Secondary | ICD-10-CM | POA: Diagnosis not present

## 2022-01-05 DIAGNOSIS — C3431 Malignant neoplasm of lower lobe, right bronchus or lung: Secondary | ICD-10-CM | POA: Diagnosis not present

## 2022-01-05 DIAGNOSIS — Z902 Acquired absence of lung [part of]: Secondary | ICD-10-CM | POA: Diagnosis not present

## 2022-01-05 DIAGNOSIS — Z510 Encounter for antineoplastic radiation therapy: Secondary | ICD-10-CM | POA: Diagnosis not present

## 2022-01-05 LAB — RAD ONC ARIA SESSION SUMMARY
Course Elapsed Days: 44
Plan Fractions Treated to Date: 2
Plan Prescribed Dose Per Fraction: 2 Gy
Plan Total Fractions Prescribed: 3
Plan Total Prescribed Dose: 6 Gy
Reference Point Dosage Given to Date: 4 Gy
Reference Point Session Dosage Given: 2 Gy
Session Number: 32

## 2022-01-06 ENCOUNTER — Ambulatory Visit
Admission: RE | Admit: 2022-01-06 | Discharge: 2022-01-06 | Disposition: A | Discharge status: Home / Self Care | Payer: Medicare PPO | Source: Ambulatory Visit | Attending: Radiation Oncology | Admitting: Radiation Oncology

## 2022-01-06 ENCOUNTER — Encounter: Payer: Self-pay | Admitting: Radiation Oncology

## 2022-01-06 ENCOUNTER — Ambulatory Visit: Payer: Medicare PPO

## 2022-01-06 ENCOUNTER — Other Ambulatory Visit: Payer: Self-pay

## 2022-01-06 DIAGNOSIS — Z923 Personal history of irradiation: Secondary | ICD-10-CM | POA: Diagnosis not present

## 2022-01-06 DIAGNOSIS — Z902 Acquired absence of lung [part of]: Secondary | ICD-10-CM | POA: Diagnosis not present

## 2022-01-06 DIAGNOSIS — Z5111 Encounter for antineoplastic chemotherapy: Secondary | ICD-10-CM | POA: Diagnosis not present

## 2022-01-06 DIAGNOSIS — Z9221 Personal history of antineoplastic chemotherapy: Secondary | ICD-10-CM | POA: Diagnosis not present

## 2022-01-06 DIAGNOSIS — I10 Essential (primary) hypertension: Secondary | ICD-10-CM | POA: Diagnosis not present

## 2022-01-06 DIAGNOSIS — R059 Cough, unspecified: Secondary | ICD-10-CM | POA: Diagnosis not present

## 2022-01-06 DIAGNOSIS — I480 Paroxysmal atrial fibrillation: Secondary | ICD-10-CM | POA: Diagnosis not present

## 2022-01-06 DIAGNOSIS — C3431 Malignant neoplasm of lower lobe, right bronchus or lung: Secondary | ICD-10-CM | POA: Diagnosis not present

## 2022-01-06 DIAGNOSIS — Z510 Encounter for antineoplastic radiation therapy: Secondary | ICD-10-CM | POA: Diagnosis not present

## 2022-01-06 LAB — RAD ONC ARIA SESSION SUMMARY
Course Elapsed Days: 45
Plan Fractions Treated to Date: 3
Plan Prescribed Dose Per Fraction: 2 Gy
Plan Total Fractions Prescribed: 3
Plan Total Prescribed Dose: 6 Gy
Reference Point Dosage Given to Date: 6 Gy
Reference Point Session Dosage Given: 2 Gy
Session Number: 33

## 2022-01-07 ENCOUNTER — Ambulatory Visit: Payer: Medicare PPO

## 2022-01-10 ENCOUNTER — Encounter: Payer: Self-pay | Admitting: Internal Medicine

## 2022-01-13 ENCOUNTER — Emergency Department (HOSPITAL_COMMUNITY)
Admission: EM | Admit: 2022-01-13 | Discharge: 2022-01-13 | Discharge status: Against Medical Advice | Payer: Medicare PPO | Attending: Emergency Medicine | Admitting: Emergency Medicine

## 2022-01-13 ENCOUNTER — Other Ambulatory Visit: Payer: Self-pay

## 2022-01-13 DIAGNOSIS — L299 Pruritus, unspecified: Secondary | ICD-10-CM | POA: Diagnosis not present

## 2022-01-13 DIAGNOSIS — M2550 Pain in unspecified joint: Secondary | ICD-10-CM | POA: Insufficient documentation

## 2022-01-13 NOTE — ED Notes (Signed)
Pt said, "I am just going to leave the wait is just too long." Pt exited the ED

## 2022-01-13 NOTE — ED Triage Notes (Signed)
Pt. Stated, I finished up chemo last week and I started having joint pain and itching yesterday.

## 2022-01-14 ENCOUNTER — Other Ambulatory Visit: Payer: Self-pay

## 2022-01-14 ENCOUNTER — Encounter (HOSPITAL_BASED_OUTPATIENT_CLINIC_OR_DEPARTMENT_OTHER): Payer: Self-pay | Admitting: Emergency Medicine

## 2022-01-14 ENCOUNTER — Emergency Department (HOSPITAL_BASED_OUTPATIENT_CLINIC_OR_DEPARTMENT_OTHER)
Admission: EM | Admit: 2022-01-14 | Discharge: 2022-01-14 | Disposition: A | Discharge status: Home / Self Care | Payer: Medicare PPO | Attending: Emergency Medicine | Admitting: Emergency Medicine

## 2022-01-14 ENCOUNTER — Emergency Department (HOSPITAL_BASED_OUTPATIENT_CLINIC_OR_DEPARTMENT_OTHER): Payer: Medicare PPO

## 2022-01-14 DIAGNOSIS — M170 Bilateral primary osteoarthritis of knee: Secondary | ICD-10-CM | POA: Diagnosis not present

## 2022-01-14 DIAGNOSIS — I10 Essential (primary) hypertension: Secondary | ICD-10-CM | POA: Diagnosis not present

## 2022-01-14 DIAGNOSIS — M160 Bilateral primary osteoarthritis of hip: Secondary | ICD-10-CM | POA: Diagnosis not present

## 2022-01-14 DIAGNOSIS — Z7982 Long term (current) use of aspirin: Secondary | ICD-10-CM | POA: Insufficient documentation

## 2022-01-14 DIAGNOSIS — M25532 Pain in left wrist: Secondary | ICD-10-CM | POA: Diagnosis not present

## 2022-01-14 DIAGNOSIS — M25561 Pain in right knee: Secondary | ICD-10-CM | POA: Diagnosis not present

## 2022-01-14 DIAGNOSIS — M2550 Pain in unspecified joint: Secondary | ICD-10-CM | POA: Diagnosis present

## 2022-01-14 DIAGNOSIS — Z85118 Personal history of other malignant neoplasm of bronchus and lung: Secondary | ICD-10-CM | POA: Insufficient documentation

## 2022-01-14 DIAGNOSIS — Z79899 Other long term (current) drug therapy: Secondary | ICD-10-CM | POA: Insufficient documentation

## 2022-01-14 DIAGNOSIS — R0602 Shortness of breath: Secondary | ICD-10-CM | POA: Insufficient documentation

## 2022-01-14 DIAGNOSIS — M1120 Other chondrocalcinosis, unspecified site: Secondary | ICD-10-CM | POA: Insufficient documentation

## 2022-01-14 DIAGNOSIS — M25531 Pain in right wrist: Secondary | ICD-10-CM | POA: Diagnosis not present

## 2022-01-14 DIAGNOSIS — M159 Polyosteoarthritis, unspecified: Secondary | ICD-10-CM | POA: Diagnosis not present

## 2022-01-14 DIAGNOSIS — M19032 Primary osteoarthritis, left wrist: Secondary | ICD-10-CM | POA: Diagnosis not present

## 2022-01-14 DIAGNOSIS — M7989 Other specified soft tissue disorders: Secondary | ICD-10-CM | POA: Diagnosis not present

## 2022-01-14 DIAGNOSIS — M1189 Other specified crystal arthropathies, multiple sites: Secondary | ICD-10-CM

## 2022-01-14 DIAGNOSIS — M19031 Primary osteoarthritis, right wrist: Secondary | ICD-10-CM | POA: Diagnosis not present

## 2022-01-14 LAB — CBC WITH DIFFERENTIAL/PLATELET
Abs Immature Granulocytes: 0.01 10*3/uL (ref 0.00–0.07)
Basophils Absolute: 0 10*3/uL (ref 0.0–0.1)
Basophils Relative: 0 %
Eosinophils Absolute: 0 10*3/uL (ref 0.0–0.5)
Eosinophils Relative: 0 %
HCT: 28.9 % — ABNORMAL LOW (ref 36.0–46.0)
Hemoglobin: 9.6 g/dL — ABNORMAL LOW (ref 12.0–15.0)
Immature Granulocytes: 0 %
Lymphocytes Relative: 6 %
Lymphs Abs: 0.4 10*3/uL — ABNORMAL LOW (ref 0.7–4.0)
MCH: 32.5 pg (ref 26.0–34.0)
MCHC: 33.2 g/dL (ref 30.0–36.0)
MCV: 98 fL (ref 80.0–100.0)
Monocytes Absolute: 0.8 10*3/uL (ref 0.1–1.0)
Monocytes Relative: 12 %
Neutro Abs: 5.6 10*3/uL (ref 1.7–7.7)
Neutrophils Relative %: 82 %
Platelets: 275 10*3/uL (ref 150–400)
RBC: 2.95 MIL/uL — ABNORMAL LOW (ref 3.87–5.11)
RDW: 16.9 % — ABNORMAL HIGH (ref 11.5–15.5)
WBC: 6.9 10*3/uL (ref 4.0–10.5)
nRBC: 0 % (ref 0.0–0.2)

## 2022-01-14 LAB — BASIC METABOLIC PANEL
Anion gap: 11 (ref 5–15)
BUN: 24 mg/dL — ABNORMAL HIGH (ref 8–23)
CO2: 26 mmol/L (ref 22–32)
Calcium: 8.8 mg/dL — ABNORMAL LOW (ref 8.9–10.3)
Chloride: 101 mmol/L (ref 98–111)
Creatinine, Ser: 1.03 mg/dL — ABNORMAL HIGH (ref 0.44–1.00)
GFR, Estimated: 54 mL/min — ABNORMAL LOW (ref >60)
Glucose, Bld: 130 mg/dL — ABNORMAL HIGH (ref 70–99)
Potassium: 4.5 mmol/L (ref 3.5–5.1)
Sodium: 138 mmol/L (ref 135–145)

## 2022-01-14 MED ORDER — ACETAMINOPHEN 500 MG PO TABS
1000.0000 mg | ORAL_TABLET | Freq: Once | ORAL | Status: AC
  Administered 2022-01-14: 1000 mg via ORAL
  Filled 2022-01-14: qty 2

## 2022-01-14 MED ORDER — KETOROLAC TROMETHAMINE 15 MG/ML IJ SOLN
15.0000 mg | Freq: Once | INTRAMUSCULAR | Status: AC
  Administered 2022-01-14: 15 mg via INTRAVENOUS
  Filled 2022-01-14: qty 1

## 2022-01-14 MED ORDER — LACTATED RINGERS IV BOLUS
1000.0000 mL | Freq: Once | INTRAVENOUS | Status: AC
  Administered 2022-01-14: 1000 mL via INTRAVENOUS

## 2022-01-14 MED ORDER — SENNOSIDES-DOCUSATE SODIUM 8.6-50 MG PO TABS: 1.0000 | ORAL_TABLET | Freq: Every day | ORAL | 0 refills | Status: DC

## 2022-01-14 MED ORDER — OXYCODONE HCL 5 MG PO TABS: 5.0000 mg | ORAL_TABLET | Freq: Four times a day (QID) | ORAL | 0 refills | Status: AC | PRN

## 2022-01-14 MED ORDER — OXYCODONE HCL 5 MG PO TABS
5.0000 mg | ORAL_TABLET | Freq: Once | ORAL | Status: AC
  Administered 2022-01-14: 5 mg via ORAL
  Filled 2022-01-14: qty 1

## 2022-01-14 NOTE — ED Notes (Signed)
Pt verbalized understanding of d/c instructions, meds, and followup care. Denies questions. VSS, no distress noted. Steady gait to exit with all belongings.  ?

## 2022-01-14 NOTE — ED Provider Notes (Signed)
Vanessa Day   CSN: [id number] Arrival date & time: 01/14/22  1834     History  Chief Complaint  Patient presents with   Shortness of Breath    Vanessa Day is a 81 y.o. female.  Patient is an 81 year old female with recent completion of treatment with chemo and radiation for lung cancer, hypertension, osteoarthritis presenting to the emergency department with joint pain.  The patient states that over the last 3 days she has had pain in her bilateral shoulders, wrists, hips and knees.  She states that she had some steroids at home and started taking the steroid course and has been taking Tylenol and Celebrex, however her pain has not improved.  She denies any fevers or chills.  She states that her joints feel swollen.  Of Day, the patient initially reported shortness of breath to triage but attributed this to pain and denied any shortness of breath on my evaluation.  The history is provided by the patient and the spouse.  Shortness of Breath      Home Medications Prior to Admission medications   Medication Sig Start Date End Date Taking? Authorizing Provider  oxyCODONE (ROXICODONE) 5 MG immediate release tablet Take 1 tablet (5 mg total) by mouth every 6 (six) hours as needed for severe pain. 01/14/22  Yes Maylon Peppers, [place], DO  senna-docusate (SENOKOT-S) 8.6-50 MG tablet Take 1 tablet by mouth daily. 01/14/22  Yes Leanord Asal K, DO  acetaminophen (TYLENOL) 500 MG tablet Take 1 tablet (500 mg total) by mouth every 6 (six) hours as needed for mild pain. Patient taking differently: Take 1,000 mg by mouth every 6 (six) hours as needed for mild pain. 08/02/18   Nani Skillern, PA-C  alosetron (LOTRONEX) 0.5 MG tablet Take 0.25 mg by mouth daily as needed (Diarrhea).    [provider]  aspirin EC 81 MG tablet Take 1 tablet (81 mg total) by mouth daily. Swallow whole. 04/05/21   Belva Crome, MD  azelastine (ASTELIN)  0.1 % nasal spray Place 1 spray into both nostrils daily as needed (Congestion). Use in each nostril as directed    [provider]  celecoxib (CELEBREX) 200 MG capsule Take 200 mg by mouth 2 (two) times daily. 07/28/21   [provider]  cetirizine (ZYRTEC) 10 MG tablet Take 10 mg by mouth daily as needed for allergies.    [provider]  ezetimibe (ZETIA) 10 MG tablet Take 10 mg by mouth daily.    [provider]  hydrochlorothiazide (MICROZIDE) 12.5 MG capsule Take 1 capsule (12.5 mg total) by mouth daily. 08/03/20   Belva Crome, MD  lidocaine-prilocaine (EMLA) cream Apply 1 Application topically as needed. 12/07/21   Heilingoetter, Cassandra L, PA-C  LORazepam (ATIVAN) 1 MG tablet Take 1-2 mg by mouth at bedtime as needed for sleep.    [provider]  losartan (COZAAR) 100 MG tablet Take 100 mg by mouth daily. 04/12/18   [provider]  methimazole (TAPAZOLE) 5 MG tablet Take 5 mg by mouth daily. 09/06/21   Eilene Ghazi, NP  Polyethyl Glycol-Propyl Glycol (SYSTANE OP) Place 1 drop into both eyes 2 (two) times daily as needed (dry eyes).    [provider]  prochlorperazine (COMPAZINE) 10 MG tablet Take 1 tablet (10 mg total) by mouth every 6 (six) hours as needed for nausea or vomiting. 11/08/21   Curt Bears, MD  sertraline (ZOLOFT) 50 MG tablet Take 50 mg by  mouth at bedtime.    [provider]  sucralfate (CARAFATE) 1 g tablet Take 1 tablet (1 g total) by mouth 4 (four) times daily -  with meals and at bedtime. 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     Physical  Exam Updated Vital Signs BP (!) 159/78   Pulse 84   Temp 98.3 F (36.8 C) (Oral)   Resp 12   SpO2 95%  Physical Exam Vitals and nursing Day reviewed.  Constitutional:      General: She is not in acute distress.    Appearance: She is well-developed.  HENT:     Head: Normocephalic and atraumatic.     Mouth/Throat:     Mouth: Mucous membranes are moist.     Pharynx: Oropharynx is clear.  Eyes:     Extraocular Movements: Extraocular movements intact.  Cardiovascular:     Rate and Rhythm: Normal rate and regular rhythm.     Heart sounds: Normal heart sounds.  Pulmonary:     Effort: Pulmonary effort is normal.     Breath sounds: Normal breath sounds.  Abdominal:     Palpations: Abdomen is soft.     Tenderness: There is no abdominal tenderness.  Musculoskeletal:        General: Normal range of motion.     Cervical back: Normal range of motion and neck supple.     Comments: Tenderness with joint swelling to bilateral MCP joints, no erythema or warmth, decreased ROM in wrist flexion/extension 2/2 pain Tenderness to palpation of bilateral shoulders with no palpable effusion, full shoulder ROM bilaterally  Tenderness to palpation of bilateral hips with no palpable effusion, full hip ROM bilaterally Tenderness to palpation of bilateral knees with palpable right knee joint effusion, no overlying erythema or warmth, decreased the flexion/extension secondary to pain, no palpable knee joint effusion on the left with full knee ROM  Skin:    General: Skin is warm and dry.  Neurological:     General: No focal deficit present.     Mental Status: She is alert and oriented to person, place, and time.  Psychiatric:        Mood and Affect: Mood normal.        Behavior: Behavior normal.     ED Results / Procedures / Treatments   Labs (all labs ordered are listed, but only abnormal results are displayed) Labs Reviewed  BASIC METABOLIC PANEL - Abnormal; Notable for the following components:       Result Value   Glucose, Bld 130 (*)    BUN 24 (*)    Creatinine, Ser 1.03 (*)    Calcium 8.8 (*)    GFR, Estimated 54 (*)    All other components within normal limits  CBC WITH DIFFERENTIAL/PLATELET - Abnormal; Notable for the following components:   RBC 2.95 (*)    Hemoglobin 9.6 (*)    HCT 28.9 (*)    RDW 16.9 (*)    Lymphs Abs 0.4 (*)    All other components within normal limits    EKG None  Radiology DG Wrist Complete Left  Result Date: 01/14/2022 CLINICAL DATA:  Wrist pain  and swelling. EXAM: LEFT WRIST - COMPLETE 3+ VIEW COMPARISON:  None Available. FINDINGS: No acute fracture or dislocation. Degenerative changes are noted at the wrist and first carpometacarpal joint. Chondrocalcinosis is present. Soft tissue swelling is present about the wrist. IMPRESSION: 1. No acute fracture or dislocation. 2. Degenerative changes at the wrist and first carpometacarpal joint. 3. Chondrocalcinosis. Electronically Signed   By: Brett Fairy M.D.   On: 01/14/2022 21:39   DG Knee Complete 4 Views Right  Result Date: 01/14/2022 CLINICAL DATA:  Pain EXAM: RIGHT KNEE - COMPLETE 4 VIEW COMPARISON:  None Available. FINDINGS: Moderate knee joint effusion. Tricompartmental degenerative changes with osteophytes. No acute fracture, dislocation or subluxation. No osteolytic or osteoblastic changes. Meniscal calcifications consistent with chondrocalcinosis. IMPRESSION: Degenerative changes. Chondrocalcinosis. Moderate effusion. No acute osseous abnormalities. Electronically Signed   By: Sammie Bench M.D.   On: 01/14/2022 21:32   DG Wrist Complete Right  Result Date: 01/14/2022 CLINICAL DATA:  Wrist pain and swelling. EXAM: RIGHT WRIST - COMPLETE 3+ VIEW COMPARISON:  None Available. FINDINGS: No acute fracture or dislocation. Degenerative changes are noted at the wrist and first carpometacarpal joint. Chondrocalcinosis is present. Mild soft tissue swelling is present at the wrist. IMPRESSION: 1.  No acute fracture or dislocation. 2. Degenerative changes at the wrist. 3. Chondrocalcinosis. Electronically Signed   By: Brett Fairy M.D.   On: 01/14/2022 21:28    Procedures Procedures    Medications Ordered in ED Medications  lactated ringers bolus 1,000 mL (0 mLs Intravenous Stopped 01/14/22 2317)  acetaminophen (TYLENOL) tablet 1,000 mg (1,000 mg Oral Given 01/14/22 2139)  ketorolac (TORADOL) 15 MG/ML injection 15 mg (15 mg Intravenous Given 01/14/22 2208)  oxyCODONE (Oxy IR/ROXICODONE) immediate release tablet 5 mg (5 mg Oral Given 01/14/22 2317)    ED Course/ Medical Decision Making/ A&P Clinical Course as of 01/14/22 2334  Fri Jan 14, 2022  2320 Upon reassessment, the patient's x-rays are consistent with pseudogout.  She reported continued pain and due to her multiple allergies, she was willing to try oxycodone as it was only made her anxious in the past and not a true allergy.  The patient's labs are at her baseline.  She is already on a steroid taper at home and was recommended to continue this. [VK]    Clinical Course User Index [VK] Kemper Durie, DO                           Medical Decision Making This patient presents to the ED with chief complaint(s) of joint pain with pertinent past medical history of recent treatment of lung cancer, HTN which further complicates the presenting complaint. The complaint involves an extensive differential diagnosis and also carries with it a high risk of complications and morbidity.    The differential diagnosis includes polyarthralgia, osteoarthritis, gout/pseudogout, patient has no fevers and no erythema or warmth making a septic joint unlikely as no signs of sepsis on exam  Additional history obtained: Additional history obtained from spouse Records reviewed oncology/radiation oncology records  ED Course and Reassessment: On patient's arrival to the emergency department she was uncomfortable appearing with palpable  effusions to bilateral wrists and right knee.  X-rays will be performed to evaluate for fracture, dislocation or arthritic changes.  She was given Tylenol, Toradol and fluids and will be closely reassessed.  Independent labs interpretation:  The following labs were independently interpreted: Within normal range/at baseline  Independent visualization of imaging: -  I independently visualized the following imaging with scope of interpretation limited to determining acute life threatening conditions related to emergency care: Bilateral wrist x-ray, right knee x-ray, which revealed chondrocalcinosis and degenerative changes of bilateral wrists and right knee joints  Consultation: - Consulted or discussed management/test interpretation w/ external professional: N/A  Consideration for admission or further workup: Patient has no emergent conditions requiring admission or further work-up at this time and is stable for discharge home with primary care follow-up  Social Determinants of health: N/A    Amount and/or Complexity of Data Reviewed Labs: ordered. Radiology: ordered.  Risk OTC drugs. Prescription drug management.          Final Clinical Impression(s) / ED Diagnoses Final diagnoses:  Polyarthralgia  Pseudogout involving multiple joints  Osteoarthritis of multiple joints, unspecified osteoarthritis type    Rx / DC Orders ED Discharge Orders          Ordered    oxyCODONE (ROXICODONE) 5 MG immediate release tablet  Every 6 hours PRN        01/14/22 2332    senna-docusate (SENOKOT-S) 8.6-50 MG tablet  Daily        01/14/22 2332              Kemper Durie, DO 01/14/22 2334

## 2022-01-14 NOTE — Discharge Instructions (Signed)
You were seen in the emergency department for your joint pain.  Your x-ray shows that you have both osteoarthritis and pseudogout of your joints which is likely causing the pain.  You should take ibuprofen 800 mg 3 times a day for the next 3 to 5 days.  You should make sure that you are taking your antacids with this as this can cause stomach upset.  You can also complete your steroid taper.  You can take Tylenol as needed for pain up to every 6 hours and you can take oxycodone as needed for breakthrough pain.  This can make you drowsy so do not take it before driving, operating heavy machinery, working or watching small children alone.  This can also make you constipated so I gave you a stool softener to take with this.  You should follow-up with your primary doctor and your rheumatologist to have your symptoms rechecked.  You should return to the emergency department if you are having fevers, redness to your joints, you are unable to walk or if you have any other new or concerning symptoms.

## 2022-01-14 NOTE — ED Triage Notes (Signed)
Pt arrives to ED with c/o SOB. She reports the SOB started an hour ago when she was anxious regarding her pain. She reports she has pain in every joint and started prednisone (on her own with meds she already had accumulated) x3 days ago.

## 2022-01-18 ENCOUNTER — Telehealth: Payer: Self-pay

## 2022-01-18 NOTE — Telephone Encounter (Signed)
This nurse spoke with patient who states that she spoke with her primary care physician who called in a higher dosage of Prednisone for her.  She was also advised to keep taking Tylenol and do not take any NSAID's. This nurse advised that this symptom is not secondary to the treatment that she is currently receiving.  She is encouraged to follow the regimen that there primary care physician has prescribed for her.   Patient states that she will try the Prednisone and see if that helps.  No further questions or concerns noted at this time.  No further concerns and questions.

## 2022-01-18 NOTE — Telephone Encounter (Signed)
Entered in Error

## 2022-01-18 NOTE — Telephone Encounter (Signed)
This nurse received a message from this patient stating that she has been to the ED twice due to having bad gout in her joints.  Patient states that she encouraged to see her Rheumatologist however their office is closed all week.  Patient states that she is unable to move her hands and she does not want to go back to the ED.  Patient states that she is taking Prednisone but it is not helping her.  She is asking what can she do.  No further concerns noted.  Provided will be made aware.

## 2022-01-20 ENCOUNTER — Telehealth: Payer: Self-pay | Admitting: Internal Medicine

## 2022-01-20 NOTE — Telephone Encounter (Signed)
Called patient regarding January appointments, patient is notified.

## 2022-01-25 ENCOUNTER — Inpatient Hospital Stay: Payer: Medicare PPO

## 2022-01-25 ENCOUNTER — Other Ambulatory Visit: Payer: Self-pay | Admitting: Physician Assistant

## 2022-01-25 ENCOUNTER — Ambulatory Visit: Payer: Medicare PPO

## 2022-01-25 ENCOUNTER — Other Ambulatory Visit: Payer: Self-pay | Admitting: Medical Oncology

## 2022-01-25 ENCOUNTER — Inpatient Hospital Stay: Payer: Medicare PPO | Attending: Internal Medicine

## 2022-01-25 ENCOUNTER — Other Ambulatory Visit: Payer: Self-pay | Admitting: Radiation Oncology

## 2022-01-25 ENCOUNTER — Telehealth: Payer: Self-pay | Admitting: Medical Oncology

## 2022-01-25 DIAGNOSIS — Z902 Acquired absence of lung [part of]: Secondary | ICD-10-CM | POA: Diagnosis not present

## 2022-01-25 DIAGNOSIS — Z95828 Presence of other vascular implants and grafts: Secondary | ICD-10-CM

## 2022-01-25 DIAGNOSIS — Z79899 Other long term (current) drug therapy: Secondary | ICD-10-CM | POA: Diagnosis not present

## 2022-01-25 DIAGNOSIS — E860 Dehydration: Secondary | ICD-10-CM | POA: Diagnosis not present

## 2022-01-25 DIAGNOSIS — Z9011 Acquired absence of right breast and nipple: Secondary | ICD-10-CM | POA: Insufficient documentation

## 2022-01-25 DIAGNOSIS — Z5112 Encounter for antineoplastic immunotherapy: Secondary | ICD-10-CM | POA: Insufficient documentation

## 2022-01-25 DIAGNOSIS — Z90710 Acquired absence of both cervix and uterus: Secondary | ICD-10-CM | POA: Diagnosis not present

## 2022-01-25 DIAGNOSIS — Z923 Personal history of irradiation: Secondary | ICD-10-CM | POA: Diagnosis not present

## 2022-01-25 DIAGNOSIS — C3431 Malignant neoplasm of lower lobe, right bronchus or lung: Secondary | ICD-10-CM | POA: Insufficient documentation

## 2022-01-25 DIAGNOSIS — Z7982 Long term (current) use of aspirin: Secondary | ICD-10-CM | POA: Insufficient documentation

## 2022-01-25 DIAGNOSIS — Z9221 Personal history of antineoplastic chemotherapy: Secondary | ICD-10-CM | POA: Diagnosis not present

## 2022-01-25 LAB — CMP (CANCER CENTER ONLY)
ALT: 31 U/L (ref 0–44)
AST: 12 U/L — ABNORMAL LOW (ref 15–41)
Albumin: 3.7 g/dL (ref 3.5–5.0)
Alkaline Phosphatase: 80 U/L (ref 38–126)
Anion gap: 7 (ref 5–15)
BUN: 30 mg/dL — ABNORMAL HIGH (ref 8–23)
CO2: 29 mmol/L (ref 22–32)
Calcium: 8.8 mg/dL — ABNORMAL LOW (ref 8.9–10.3)
Chloride: 102 mmol/L (ref 98–111)
Creatinine: 1.19 mg/dL — ABNORMAL HIGH (ref 0.44–1.00)
GFR, Estimated: 46 mL/min — ABNORMAL LOW (ref >60)
Glucose, Bld: 103 mg/dL — ABNORMAL HIGH (ref 70–99)
Potassium: 3.7 mmol/L (ref 3.5–5.1)
Sodium: 138 mmol/L (ref 135–145)
Total Bilirubin: 0.9 mg/dL (ref 0.3–1.2)
Total Protein: 6.6 g/dL (ref 6.5–8.1)

## 2022-01-25 LAB — CBC WITH DIFFERENTIAL (CANCER CENTER ONLY)
Abs Immature Granulocytes: 0.15 10*3/uL — ABNORMAL HIGH (ref 0.00–0.07)
Basophils Absolute: 0 10*3/uL (ref 0.0–0.1)
Basophils Relative: 0 %
Eosinophils Absolute: 0.1 10*3/uL (ref 0.0–0.5)
Eosinophils Relative: 1 %
HCT: 33.5 % — ABNORMAL LOW (ref 36.0–46.0)
Hemoglobin: 11.1 g/dL — ABNORMAL LOW (ref 12.0–15.0)
Immature Granulocytes: 2 %
Lymphocytes Relative: 10 %
Lymphs Abs: 1 10*3/uL (ref 0.7–4.0)
MCH: 32.5 pg (ref 26.0–34.0)
MCHC: 33.1 g/dL (ref 30.0–36.0)
MCV: 98 fL (ref 80.0–100.0)
Monocytes Absolute: 0.8 10*3/uL (ref 0.1–1.0)
Monocytes Relative: 8 %
Neutro Abs: 8.2 10*3/uL — ABNORMAL HIGH (ref 1.7–7.7)
Neutrophils Relative %: 79 %
Platelet Count: 357 10*3/uL (ref 150–400)
RBC: 3.42 MIL/uL — ABNORMAL LOW (ref 3.87–5.11)
RDW: 16.5 % — ABNORMAL HIGH (ref 11.5–15.5)
WBC Count: 10.3 10*3/uL (ref 4.0–10.5)
nRBC: 0 % (ref 0.0–0.2)

## 2022-01-25 MED ORDER — HEPARIN SOD (PORK) LOCK FLUSH 100 UNIT/ML IV SOLN
500.0000 [IU] | Freq: Once | INTRAVENOUS | Status: AC
  Administered 2022-01-25: 500 [IU] via INTRAVENOUS

## 2022-01-25 MED ORDER — SODIUM CHLORIDE 0.9% FLUSH
10.0000 mL | INTRAVENOUS | Status: AC | PRN
  Administered 2022-01-25: 10 mL

## 2022-01-25 MED ORDER — SODIUM CHLORIDE 0.9% FLUSH
10.0000 mL | INTRAVENOUS | Status: AC | PRN
  Administered 2022-01-25: 10 mL via INTRAVENOUS

## 2022-01-25 MED ORDER — SUCRALFATE 1 GM/10ML PO SUSP: 1.0000 g | Freq: Three times a day (TID) | ORAL | 0 refills | Status: DC

## 2022-01-25 MED ORDER — SODIUM CHLORIDE 0.9 % IV SOLN: Freq: Once | INTRAVENOUS | Status: AC

## 2022-01-25 NOTE — Telephone Encounter (Addendum)
Explosive diarrhea started back today.   Unable to make it to bathroom . Took lotronex and it did not help . I told her to stop it -( she said is was a very  old prescription )and to take imodium.  She stated that she  is light headed and she is not replenishing her fluids.  Esophagitis/heartburn. -She cannot get the carafate to dissolve enough to swallow it. She is taking mylanta instead with little relief. How long does it take for the esophageal swelling to go down. Does it need to go down before CT on Monday. Message sent to XRT. Hypertension- 195/120, 150/116. She is waiting for call from Dr. Brigitte Pulse about restarting her usual doses of BP meds. Messaged Dr Brigitte Pulse.

## 2022-01-25 NOTE — Telephone Encounter (Signed)
Pt confirmed appt today at 1 pm for IVF.

## 2022-01-25 NOTE — Progress Notes (Signed)
Despite crushing her Carafate in water, she's having a hard time with the carafate dissolving to take this for her radiation induced esophagitis. I received a request for a prescription for suspensions. I asked nursing to communicate she can also continue Mylanta and alternate with the Carafate suspension, but to warn her that insurance coverage may make the cost higher for this form.

## 2022-01-31 ENCOUNTER — Ambulatory Visit (HOSPITAL_COMMUNITY)
Admission: RE | Admit: 2022-01-31 | Discharge: 2022-01-31 | Disposition: A | Discharge status: Home / Self Care | Payer: Medicare PPO | Source: Ambulatory Visit | Attending: Internal Medicine | Admitting: Internal Medicine

## 2022-01-31 ENCOUNTER — Inpatient Hospital Stay: Payer: Medicare PPO

## 2022-01-31 ENCOUNTER — Other Ambulatory Visit: Payer: Medicare PPO

## 2022-01-31 DIAGNOSIS — C3490 Malignant neoplasm of unspecified part of unspecified bronchus or lung: Secondary | ICD-10-CM

## 2022-01-31 DIAGNOSIS — J8410 Pulmonary fibrosis, unspecified: Secondary | ICD-10-CM | POA: Diagnosis not present

## 2022-01-31 DIAGNOSIS — C3431 Malignant neoplasm of lower lobe, right bronchus or lung: Secondary | ICD-10-CM

## 2022-01-31 LAB — CBC WITH DIFFERENTIAL (CANCER CENTER ONLY)
Abs Immature Granulocytes: 0.03 10*3/uL (ref 0.00–0.07)
Basophils Absolute: 0 10*3/uL (ref 0.0–0.1)
Basophils Relative: 0 %
Eosinophils Absolute: 0 10*3/uL (ref 0.0–0.5)
Eosinophils Relative: 0 %
HCT: 34.3 % — ABNORMAL LOW (ref 36.0–46.0)
Hemoglobin: 11.4 g/dL — ABNORMAL LOW (ref 12.0–15.0)
Immature Granulocytes: 0 %
Lymphocytes Relative: 5 %
Lymphs Abs: 0.6 10*3/uL — ABNORMAL LOW (ref 0.7–4.0)
MCH: 32.5 pg (ref 26.0–34.0)
MCHC: 33.2 g/dL (ref 30.0–36.0)
MCV: 97.7 fL (ref 80.0–100.0)
Monocytes Absolute: 0.5 10*3/uL (ref 0.1–1.0)
Monocytes Relative: 4 %
Neutro Abs: 10.2 10*3/uL — ABNORMAL HIGH (ref 1.7–7.7)
Neutrophils Relative %: 91 %
Platelet Count: 269 10*3/uL (ref 150–400)
RBC: 3.51 MIL/uL — ABNORMAL LOW (ref 3.87–5.11)
RDW: 16.5 % — ABNORMAL HIGH (ref 11.5–15.5)
WBC Count: 11.4 10*3/uL — ABNORMAL HIGH (ref 4.0–10.5)
nRBC: 0 % (ref 0.0–0.2)

## 2022-01-31 LAB — CMP (CANCER CENTER ONLY)
ALT: 14 U/L (ref 0–44)
AST: 11 U/L — ABNORMAL LOW (ref 15–41)
Albumin: 4 g/dL (ref 3.5–5.0)
Alkaline Phosphatase: 75 U/L (ref 38–126)
Anion gap: 8 (ref 5–15)
BUN: 35 mg/dL — ABNORMAL HIGH (ref 8–23)
CO2: 31 mmol/L (ref 22–32)
Calcium: 9.9 mg/dL (ref 8.9–10.3)
Chloride: 100 mmol/L (ref 98–111)
Creatinine: 1.23 mg/dL — ABNORMAL HIGH (ref 0.44–1.00)
GFR, Estimated: 44 mL/min — ABNORMAL LOW (ref >60)
Glucose, Bld: 161 mg/dL — ABNORMAL HIGH (ref 70–99)
Potassium: 3.5 mmol/L (ref 3.5–5.1)
Sodium: 139 mmol/L (ref 135–145)
Total Bilirubin: 1.1 mg/dL (ref 0.3–1.2)
Total Protein: 6.6 g/dL (ref 6.5–8.1)

## 2022-01-31 MED ORDER — IOHEXOL 300 MG/ML  SOLN
75.0000 mL | Freq: Once | INTRAMUSCULAR | Status: AC | PRN
  Administered 2022-01-31: 75 mL via INTRAVENOUS

## 2022-01-31 MED ORDER — HEPARIN SOD (PORK) LOCK FLUSH 100 UNIT/ML IV SOLN
500.0000 [IU] | Freq: Once | INTRAVENOUS | Status: AC
  Administered 2022-01-31: 500 [IU] via INTRAVENOUS

## 2022-01-31 MED ORDER — HEPARIN SOD (PORK) LOCK FLUSH 100 UNIT/ML IV SOLN
INTRAVENOUS | Status: AC
  Filled 2022-01-31: qty 5

## 2022-02-02 ENCOUNTER — Inpatient Hospital Stay (HOSPITAL_BASED_OUTPATIENT_CLINIC_OR_DEPARTMENT_OTHER): Payer: Medicare PPO | Admitting: Internal Medicine

## 2022-02-02 VITALS — BP 108/60 | HR 110 | Temp 97.6°F | Resp 16 | Ht 62.0 in | Wt 138.9 lb

## 2022-02-02 DIAGNOSIS — Z923 Personal history of irradiation: Secondary | ICD-10-CM | POA: Diagnosis not present

## 2022-02-02 DIAGNOSIS — Z79899 Other long term (current) drug therapy: Secondary | ICD-10-CM | POA: Diagnosis not present

## 2022-02-02 DIAGNOSIS — C3431 Malignant neoplasm of lower lobe, right bronchus or lung: Secondary | ICD-10-CM

## 2022-02-02 DIAGNOSIS — Z9221 Personal history of antineoplastic chemotherapy: Secondary | ICD-10-CM | POA: Diagnosis not present

## 2022-02-02 DIAGNOSIS — Z5112 Encounter for antineoplastic immunotherapy: Secondary | ICD-10-CM | POA: Diagnosis not present

## 2022-02-02 DIAGNOSIS — E860 Dehydration: Secondary | ICD-10-CM | POA: Diagnosis not present

## 2022-02-02 DIAGNOSIS — Z9011 Acquired absence of right breast and nipple: Secondary | ICD-10-CM | POA: Diagnosis not present

## 2022-02-02 DIAGNOSIS — Z7982 Long term (current) use of aspirin: Secondary | ICD-10-CM | POA: Diagnosis not present

## 2022-02-02 DIAGNOSIS — Z902 Acquired absence of lung [part of]: Secondary | ICD-10-CM | POA: Diagnosis not present

## 2022-02-02 NOTE — Progress Notes (Signed)
DISCONTINUE ON PATHWAY REGIMEN - Non-Small Cell Lung     A cycle is every 7 days, concurrent with RT:     Paclitaxel      Carboplatin   **Always confirm dose/schedule in your pharmacy ordering system**  REASON: Continuation Of Treatment PRIOR TREATMENT: [id number]: Carboplatin AUC=2 + Paclitaxel 45 mg/m2 Weekly During Radiation TREATMENT RESPONSE: Partial Response (PR)  START ON PATHWAY REGIMEN - Non-Small Cell Lung     A cycle is every 28 days:     Durvalumab   **Always confirm dose/schedule in your pharmacy ordering system**  Patient Characteristics: Preoperative or Nonsurgical Candidate (Clinical Staging), Stage III - Nonsurgical Candidate (Nonsquamous and Squamous), PS = 0, 1 Therapeutic Status: Preoperative or Nonsurgical Candidate (Clinical Staging) AJCC T Category: cT1c AJCC N Category: cN2 AJCC M Category: cM0 AJCC 8 Stage Grouping: IIIA ECOG Performance Status: 1 Intent of Therapy: Curative Intent, Discussed with Patient

## 2022-02-02 NOTE — Progress Notes (Signed)
[facility] [phone]   [phone]  OFFICE PROGRESS NOTE  [name], MD [address]  [place] [address]  DIAGNOSIS: Recurrent non-small cell lung cancer presented as a stage IIIa (T1c, N2, M0) non-small cell lung cancer, adenocarcinoma initially diagnosed as T1a (T1c, N0, M0) non-small cell lung cancer, adenocarcinoma presented with right lower lobe lung nodule in June 2020.  PRIOR THERAPY:  1) Right video-assisted thoracoscopy, Wedge resection right lower lobe nodule, Thoracoscopic right lower lobectomy, Lymph node dissection on July 26, 2018 under the care of Dr. Roxan Hockey. 2) Concurrent chemoradiation with weekly carboplatin for AUC of 2 and paclitaxel 45 Mg/M2.  First dose November 22, 2021.  Status post 6 cycles.  CURRENT THERAPY: Consolidation treatment with immunotherapy with Imfinzi 1500 Mg IV every 4 weeks.  First dose February 09, 2022.  INTERVAL HISTORY: Vanessa Day 82 y.o. female returns to the clinic today for follow-up visit accompanied by her husband.  The patient continues to complain of several nonspecific issues including recent gout episodes as well as sciatica of the left hip.  She received steroid injection to this area with minimal improvement.  She also has some persistent dysphagia from the radiation-induced esophagitis.  She has no current chest pain, shortness of breath, cough or hemoptysis.  She has no nausea, vomiting, diarrhea or constipation.  She has occasional dizzy spells.  She had repeat CT scan of the chest performed recently and she is here for evaluation and discussion of her scan results.   MEDICAL HISTORY: Past Medical History:  Diagnosis Date   Adenocarcinoma of lung, stage 1, right ([place]) 2020   Anxiety    Arthritis    back, feet, jaw - per patient "all over"   Breast cancer ([place]) 2000   Right breast   Family history of breast cancer    GERD (gastroesophageal reflux disease)     Graves disease    H/O hiatal hernia    Hyperlipidemia    Hypertension    IBS (irritable bowel syndrome)    Mitral regurgitation    moderate mitral regurgitation 08/20/20 echo   Neck pain    PAF (paroxysmal atrial fibrillation) ([facility])    ~ 09/2020 in setting of hyperthyroidism   PONV (postoperative nausea and vomiting)    Pre-diabetes     ALLERGIES:  is allergic to hydrocodone, codeine, dicyclomine hcl, hyoscyamine, nitrofurantoin, statins, sudafed [pseudoephedrine], oxycodone-acetaminophen, and sulfa antibiotics.  MEDICATIONS:  Current Outpatient Medications  Medication Sig Dispense Refill   acetaminophen (TYLENOL) 500 MG tablet Take 1 tablet (500 mg total) by mouth every 6 (six) hours as needed for mild pain. (Patient taking differently: Take 1,000 mg by mouth every 6 (six) hours as needed for mild pain.) 30 tablet 0   alosetron (LOTRONEX) 0.5 MG tablet Take 0.25 mg by mouth daily as needed (Diarrhea).     aspirin EC 81 MG tablet Take 1 tablet (81 mg total) by mouth daily. Swallow whole. 90 tablet 3   azelastine (ASTELIN) 0.1 % nasal spray Place 1 spray into both nostrils daily as needed (Congestion). Use in each nostril as directed     celecoxib (CELEBREX) 200 MG capsule Take 200 mg by mouth 2 (two) times daily.     cetirizine (ZYRTEC) 10 MG tablet Take 10 mg by mouth daily as needed for allergies.     ezetimibe (ZETIA) 10 MG tablet Take 10 mg by mouth daily.     hydrochlorothiazide (MICROZIDE) 12.5 MG  capsule Take 1 capsule (12.5 mg total) by mouth daily. 45 capsule 3   lidocaine-prilocaine (EMLA) cream Apply 1 Application topically as needed. 30 g 2   LORazepam (ATIVAN) 1 MG tablet Take 1-2 mg by mouth at bedtime as needed for sleep.     losartan (COZAAR) 100 MG tablet Take 100 mg by mouth daily.     methimazole (TAPAZOLE) 5 MG tablet Take 5 mg by mouth daily.     oxyCODONE (ROXICODONE) 5 MG immediate release tablet Take 1 tablet (5 mg total) by mouth every 6 (six) hours as needed  for severe pain. 7 tablet 0   Polyethyl Glycol-Propyl Glycol (SYSTANE OP) Place 1 drop into both eyes 2 (two) times daily as needed (dry eyes).     prochlorperazine (COMPAZINE) 10 MG tablet Take 1 tablet (10 mg total) by mouth every 6 (six) hours as needed for nausea or vomiting. 30 tablet 0   senna-docusate (SENOKOT-S) 8.6-50 MG tablet Take 1 tablet by mouth daily. 30 tablet 0   sertraline (ZOLOFT) 50 MG tablet Take 50 mg by mouth at bedtime.     sucralfate (CARAFATE) 1 GM/10ML suspension Take 10 mLs (1 g total) by mouth 4 (four) times daily -  with meals and at bedtime. 420 mL 0   No current facility-administered medications for this visit.   Facility-Administered Medications Ordered in Other Visits  Medication Dose Route Frequency Provider Last Rate Last Admin   sodium chloride flush (NS) 0.9 % injection 10 mL  10 mL Intravenous PRN Curt Bears, MD   10 mL at 01/25/22 1534    SURGICAL HISTORY:  Past Surgical History:  Procedure Laterality Date   ABDOMINAL HYSTERECTOMY     APPENDECTOMY     BACK SURGERY     BREAST BIOPSY     BREAST SURGERY     CARPOMETACARPEL SUSPENSION PLASTY Left 08/25/2015   Procedure: SUSPENSIONPLASTY LEFT THUMB TRAPEZIUM EXCISION ABDUCTOR POLLICIS LONGUS TRANSFER;  Surgeon: Daryll Brod, MD;  Location: [facility];  Service: Orthopedics;  Laterality: Left;   CHOLECYSTECTOMY     COLONOSCOPY     several   EYE SURGERY     cataract right and left eye   GAS INSERTION  06/23/2011   Procedure: INSERTION OF GAS;  Surgeon: Hayden Pedro, MD;  Location: [facility];  Service: Ophthalmology;  Laterality: Left;  SF6   IR IMAGING GUIDED PORT INSERTION  12/14/2021   LOBECTOMY Right 07/26/2018   Procedure: RIGHT LOWER LOBE LOBECTOMY;  Surgeon: Melrose Nakayama, MD;  Location: [facility];  Service: Thoracic;  Laterality: Right;   MASTECTOMY     right breast   SCLERAL BUCKLE  06/23/2011   Procedure: SCLERAL BUCKLE;  Surgeon: Hayden Pedro, MD;  Location: [facility];  Service: Ophthalmology;  Laterality: Left;   SEGMENTECOMY Right 07/26/2018   Procedure: SEGMENTECTOMY;  Surgeon: Melrose Nakayama, MD;  Location: [facility];  Service: Thoracic;  Laterality: Right;   TONSILLECTOMY     VIDEO ASSISTED THORACOSCOPY Right 07/26/2018   Procedure: VIDEO ASSISTED THORACOSCOPY;  Surgeon: Melrose Nakayama, MD;  Location: [facility];  Service: Thoracic;  Laterality: Right;   VIDEO BRONCHOSCOPY WITH ENDOBRONCHIAL ULTRASOUND N/A 10/27/2021   Procedure: VIDEO BRONCHOSCOPY WITH ENDOBRONCHIAL ULTRASOUND;  Surgeon: Melrose Nakayama, MD;  Location[facility];  Service: Thoracic;  Laterality: N/A;    REVIEW OF SYSTEMS:  Constitutional: positive for fatigue Eyes: negative Ears, nose, mouth, throat, and face: negative Respiratory: negative Cardiovascular: negative Gastrointestinal: positive for dysphagia  Genitourinary:negative Integument/breast: negative Hematologic/lymphatic: negative Musculoskeletal:positive for arthralgias Neurological: positive for dizziness Behavioral/Psych: negative Endocrine: negative Allergic/Immunologic: negative   PHYSICAL EXAMINATION: General appearance: alert, cooperative, fatigued, and no distress Head: Normocephalic, without obvious abnormality, atraumatic Neck: no adenopathy, no JVD, supple, symmetrical, trachea midline, and thyroid not enlarged, symmetric, no tenderness/mass/nodules Lymph nodes: Cervical, supraclavicular, and axillary nodes normal. Resp: clear to auscultation bilaterally Back: symmetric, no curvature. ROM normal. No CVA tenderness. Cardio: regular rate and rhythm, S1, S2 normal, no murmur, click, rub or gallop GI: soft, non-tender; bowel sounds normal; no masses,  no organomegaly Extremities: extremities normal, atraumatic, no cyanosis or edema Neurologic: Alert and oriented X 3, normal strength and tone. Normal symmetric reflexes. Normal coordination and gait  ECOG PERFORMANCE STATUS: 1 - Symptomatic but  completely ambulatory   Blood pressure 108/60, pulse (!) 110, temperature 97.6 F (36.4 C), temperature source Oral, resp. rate 16, height 5\' 2"  (1.575 m), weight 138 lb 14.4 oz (63 kg), SpO2 99 %.  LABORATORY DATA: Lab Results  Component Value Date   WBC 11.4 (H) 01/31/2022   HGB 11.4 (L) 01/31/2022   HCT 34.3 (L) 01/31/2022   MCV 97.7 01/31/2022   PLT 269 01/31/2022      Chemistry      Component Value Date/Time   NA 139 01/31/2022 1152   K 3.5 01/31/2022 1152   CL 100 01/31/2022 1152   CO2 31 01/31/2022 1152   BUN 35 (H) 01/31/2022 1152   CREATININE 1.23 (H) 01/31/2022 1152      Component Value Date/Time   CALCIUM 9.9 01/31/2022 1152   ALKPHOS 75 01/31/2022 1152   AST 11 (L) 01/31/2022 1152   ALT 14 01/31/2022 1152   BILITOT 1.1 01/31/2022 1152       RADIOGRAPHIC STUDIES: CT Chest W Contrast  Result Date: 02/01/2022 CLINICAL DATA:  Non-small cell lung cancer restaging. * Tracking Code: BO * EXAM: CT CHEST WITH CONTRAST TECHNIQUE: Multidetector CT imaging of the chest was performed during intravenous contrast administration. RADIATION DOSE REDUCTION: This exam was performed according to the departmental dose-optimization program which includes automated exposure control, adjustment of the mA and/or kV according to patient size and/or use of iterative reconstruction technique. CONTRAST:  43mL OMNIPAQUE IOHEXOL 300 MG/ML  SOLN COMPARISON:  Chest CT 09/14/2021.  PET-CT 10/01/2021. FINDINGS: Cardiovascular: No acute vascular findings. Right IJ Port-A-Cath extends to the superior cavoatrial junction. There is diffuse atherosclerosis of the aorta, great vessels and coronary arteries. There are calcifications of the aortic valve. The heart size is normal. There is no pericardial effusion. Mediastinum/Nodes: There are no enlarged mediastinal, hilar or axillary lymph nodes.Previously demonstrated right paratracheal node which was mildly hypermetabolic on PET-CT has decreased in size,  measuring 6 mm on image 55/2 (previously 10 mm). There are surgical clips in the right axilla. There is stable mild diffuse esophageal wall thickening which may be treatment related. The thyroid gland and trachea appear unremarkable. Lungs/Pleura: No pleural effusion or pneumothorax. Status post right lower lobe resection. Previously demonstrated perifissural and subpleural nodularity posteriorly in the right hemithorax has not progressed and may be slightly improved compared with the prior CT. No new or enlarging pulmonary nodules are identified. Calcified right upper lobe granuloma noted. Upper abdomen: No significant findings are seen within the visualized upper abdomen. There is mild hepatic steatosis post cholecystectomy. Musculoskeletal/Chest wall: There is no chest wall mass or suspicious osseous finding. Previous right mastectomy with breast reconstruction and axillary node dissection. IMPRESSION: 1. Previously demonstrated perifissural and subpleural  nodularity posteriorly in the right hemithorax has not progressed and may be slightly improved compared with the prior CT. Continued follow-up recommended. 2. Previously demonstrated mildly hypermetabolic right paratracheal lymph node has decreased in size. 3. No evidence of local recurrence or progressive metastatic disease. 4.  Aortic Atherosclerosis (ICD10-I70.0). Electronically Signed   By: Richardean Sale M.D.   On: 02/01/2022 09:50   DG Wrist Complete Left  Result Date: 01/14/2022 CLINICAL DATA:  Wrist pain and swelling. EXAM: LEFT WRIST - COMPLETE 3+ VIEW COMPARISON:  None Available. FINDINGS: No acute fracture or dislocation. Degenerative changes are noted at the wrist and first carpometacarpal joint. Chondrocalcinosis is present. Soft tissue swelling is present about the wrist. IMPRESSION: 1. No acute fracture or dislocation. 2. Degenerative changes at the wrist and first carpometacarpal joint. 3. Chondrocalcinosis. Electronically Signed   By:  Brett Fairy M.D.   On: 01/14/2022 21:39   DG Knee Complete 4 Views Right  Result Date: 01/14/2022 CLINICAL DATA:  Pain EXAM: RIGHT KNEE - COMPLETE 4 VIEW COMPARISON:  None Available. FINDINGS: Moderate knee joint effusion. Tricompartmental degenerative changes with osteophytes. No acute fracture, dislocation or subluxation. No osteolytic or osteoblastic changes. Meniscal calcifications consistent with chondrocalcinosis. IMPRESSION: Degenerative changes. Chondrocalcinosis. Moderate effusion. No acute osseous abnormalities. Electronically Signed   By: Sammie Bench M.D.   On: 01/14/2022 21:32   DG Wrist Complete Right  Result Date: 01/14/2022 CLINICAL DATA:  Wrist pain and swelling. EXAM: RIGHT WRIST - COMPLETE 3+ VIEW COMPARISON:  None Available. FINDINGS: No acute fracture or dislocation. Degenerative changes are noted at the wrist and first carpometacarpal joint. Chondrocalcinosis is present. Mild soft tissue swelling is present at the wrist. IMPRESSION: 1. No acute fracture or dislocation. 2. Degenerative changes at the wrist. 3. Chondrocalcinosis. Electronically Signed   By: Brett Fairy M.D.   On: 01/14/2022 21:28     ASSESSMENT AND PLAN: This is a very pleasant 82 years old white female with stage IIIA (T1c, N2, M0) non-small cell lung cancer, adenocarcinoma initially diagnosed as diagnosed with a stage IA (T1c, N0, M0) non-small cell lung cancer, adenocarcinoma status post right lower lobectomy with lymph node dissection in July 2020.  She had disease recurrence in October 2023. The patient is currently on observation and she is feeling fine except for the dry cough. Her CT scan of the chest followed by PET scan showed increasing nodularity of the pleural surface in the right chest with ill-defined areas in the cardiophrenic recess and increasing size of lymph nodes in the mediastinum. The patient underwent bronchoscopy with EBUS under the care of Dr. Roxan Hockey and the final pathology  was consistent with recurrent adenocarcinoma in the mediastinal lymph nodes. The patient started a course of concurrent chemoradiation with weekly carboplatin for AUC of 2 and paclitaxel 45 Mg/M2 status post 7 cycles.  She tolerated this treatment well except for the radiation-induced esophagitis as well as fatigue. The patient had repeat CT scan of the chest performed recently.  Her scan showed improvement of her disease especially in the mediastinal lymph nodes. I discussed with the patient her next treatment option and recommended for her consolidation treatment with immunotherapy with Imfinzi 1500 Mg IV every 4 weeks for a total of 1 year as long as the patient has no evidence for disease progression or unacceptable toxicity. I discussed with the patient the adverse effect of this treatment including but not limited to immunotherapy mediated skin rash, diarrhea, inflammation of the lung, kidney, liver, thyroid or other endocrine  dysfunction including type 1 diabetes mellitus. She would like to proceed with the treatment and she is expected to start the first cycle next week. She will come back for follow-up visit in 5 weeks for evaluation with the start of cycle #2. The patient was advised to call immediately if she has any other concerning symptoms in the interval.  The patient voices understanding of current disease status and treatment options and is in agreement with the current care plan. All questions were answered. The patient knows to call the clinic with any problems, questions or concerns. We can certainly see the patient much sooner if necessary. The total time spent in the appointment was 30 minutes.  Disclaimer: This note was dictated with voice recognition software. Similar sounding words can inadvertently be transcribed and may not be corrected upon review.

## 2022-02-03 DIAGNOSIS — R197 Diarrhea, unspecified: Secondary | ICD-10-CM | POA: Diagnosis not present

## 2022-02-03 DIAGNOSIS — G4700 Insomnia, unspecified: Secondary | ICD-10-CM | POA: Diagnosis not present

## 2022-02-03 DIAGNOSIS — I10 Essential (primary) hypertension: Secondary | ICD-10-CM | POA: Diagnosis not present

## 2022-02-03 DIAGNOSIS — K209 Esophagitis, unspecified without bleeding: Secondary | ICD-10-CM | POA: Diagnosis not present

## 2022-02-03 DIAGNOSIS — E0590 Thyrotoxicosis, unspecified without thyrotoxic crisis or storm: Secondary | ICD-10-CM | POA: Diagnosis not present

## 2022-02-03 DIAGNOSIS — E538 Deficiency of other specified B group vitamins: Secondary | ICD-10-CM | POA: Diagnosis not present

## 2022-02-03 DIAGNOSIS — M109 Gout, unspecified: Secondary | ICD-10-CM | POA: Diagnosis not present

## 2022-02-03 DIAGNOSIS — M159 Polyosteoarthritis, unspecified: Secondary | ICD-10-CM | POA: Diagnosis not present

## 2022-02-03 DIAGNOSIS — C3431 Malignant neoplasm of lower lobe, right bronchus or lung: Secondary | ICD-10-CM | POA: Diagnosis not present

## 2022-02-05 ENCOUNTER — Other Ambulatory Visit: Payer: Self-pay

## 2022-02-07 ENCOUNTER — Telehealth: Payer: Self-pay | Admitting: Internal Medicine

## 2022-02-07 NOTE — Telephone Encounter (Signed)
Called patient regarding upcoming January-April appointments, patient is notified.

## 2022-02-08 ENCOUNTER — Other Ambulatory Visit: Payer: Self-pay

## 2022-02-09 ENCOUNTER — Inpatient Hospital Stay: Payer: Medicare PPO

## 2022-02-09 VITALS — BP 127/76 | HR 85 | Temp 98.5°F | Resp 16 | Wt 144.0 lb

## 2022-02-09 DIAGNOSIS — E860 Dehydration: Secondary | ICD-10-CM | POA: Diagnosis not present

## 2022-02-09 DIAGNOSIS — Z95828 Presence of other vascular implants and grafts: Secondary | ICD-10-CM | POA: Insufficient documentation

## 2022-02-09 DIAGNOSIS — Z9221 Personal history of antineoplastic chemotherapy: Secondary | ICD-10-CM | POA: Diagnosis not present

## 2022-02-09 DIAGNOSIS — Z9011 Acquired absence of right breast and nipple: Secondary | ICD-10-CM | POA: Diagnosis not present

## 2022-02-09 DIAGNOSIS — C3491 Malignant neoplasm of unspecified part of right bronchus or lung: Secondary | ICD-10-CM

## 2022-02-09 DIAGNOSIS — Z923 Personal history of irradiation: Secondary | ICD-10-CM | POA: Diagnosis not present

## 2022-02-09 DIAGNOSIS — C3431 Malignant neoplasm of lower lobe, right bronchus or lung: Secondary | ICD-10-CM | POA: Diagnosis not present

## 2022-02-09 DIAGNOSIS — Z902 Acquired absence of lung [part of]: Secondary | ICD-10-CM | POA: Diagnosis not present

## 2022-02-09 DIAGNOSIS — Z79899 Other long term (current) drug therapy: Secondary | ICD-10-CM | POA: Diagnosis not present

## 2022-02-09 DIAGNOSIS — Z7982 Long term (current) use of aspirin: Secondary | ICD-10-CM | POA: Diagnosis not present

## 2022-02-09 DIAGNOSIS — Z5112 Encounter for antineoplastic immunotherapy: Secondary | ICD-10-CM | POA: Diagnosis not present

## 2022-02-09 LAB — CBC WITH DIFFERENTIAL (CANCER CENTER ONLY)
Abs Immature Granulocytes: 0.02 10*3/uL (ref 0.00–0.07)
Basophils Absolute: 0 10*3/uL (ref 0.0–0.1)
Basophils Relative: 0 %
Eosinophils Absolute: 0.1 10*3/uL (ref 0.0–0.5)
Eosinophils Relative: 2 %
HCT: 30.8 % — ABNORMAL LOW (ref 36.0–46.0)
Hemoglobin: 10.1 g/dL — ABNORMAL LOW (ref 12.0–15.0)
Immature Granulocytes: 0 %
Lymphocytes Relative: 17 %
Lymphs Abs: 0.9 10*3/uL (ref 0.7–4.0)
MCH: 32.4 pg (ref 26.0–34.0)
MCHC: 32.8 g/dL (ref 30.0–36.0)
MCV: 98.7 fL (ref 80.0–100.0)
Monocytes Absolute: 0.5 10*3/uL (ref 0.1–1.0)
Monocytes Relative: 8 %
Neutro Abs: 3.9 10*3/uL (ref 1.7–7.7)
Neutrophils Relative %: 73 %
Platelet Count: 220 10*3/uL (ref 150–400)
RBC: 3.12 MIL/uL — ABNORMAL LOW (ref 3.87–5.11)
RDW: 16 % — ABNORMAL HIGH (ref 11.5–15.5)
WBC Count: 5.4 10*3/uL (ref 4.0–10.5)
nRBC: 0 % (ref 0.0–0.2)

## 2022-02-09 LAB — CMP (CANCER CENTER ONLY)
ALT: 20 U/L (ref 0–44)
AST: 20 U/L (ref 15–41)
Albumin: 3.5 g/dL (ref 3.5–5.0)
Alkaline Phosphatase: 59 U/L (ref 38–126)
Anion gap: 6 (ref 5–15)
BUN: 26 mg/dL — ABNORMAL HIGH (ref 8–23)
CO2: 27 mmol/L (ref 22–32)
Calcium: 8.7 mg/dL — ABNORMAL LOW (ref 8.9–10.3)
Chloride: 110 mmol/L (ref 98–111)
Creatinine: 1.25 mg/dL — ABNORMAL HIGH (ref 0.44–1.00)
GFR, Estimated: 43 mL/min — ABNORMAL LOW (ref >60)
Glucose, Bld: 142 mg/dL — ABNORMAL HIGH (ref 70–99)
Potassium: 3.7 mmol/L (ref 3.5–5.1)
Sodium: 143 mmol/L (ref 135–145)
Total Bilirubin: 1 mg/dL (ref 0.3–1.2)
Total Protein: 6.7 g/dL (ref 6.5–8.1)

## 2022-02-09 MED ORDER — SODIUM CHLORIDE 0.9 % IV SOLN: Freq: Once | INTRAVENOUS | Status: DC

## 2022-02-09 MED ORDER — SODIUM CHLORIDE 0.9% FLUSH
10.0000 mL | INTRAVENOUS | Status: DC | PRN
  Administered 2022-02-09: 10 mL

## 2022-02-09 MED ORDER — HEPARIN SOD (PORK) LOCK FLUSH 100 UNIT/ML IV SOLN
500.0000 [IU] | Freq: Once | INTRAVENOUS | Status: AC | PRN
  Administered 2022-02-09: 500 [IU]

## 2022-02-09 MED ORDER — SODIUM CHLORIDE 0.9% FLUSH
10.0000 mL | Freq: Once | INTRAVENOUS | Status: AC
  Administered 2022-02-09: 10 mL

## 2022-02-09 MED ORDER — SODIUM CHLORIDE 0.9 % IV SOLN
1500.0000 mg | Freq: Once | INTRAVENOUS | Status: AC
  Administered 2022-02-09: 1500 mg via INTRAVENOUS
  Filled 2022-02-09: qty 30

## 2022-02-09 NOTE — Patient Instructions (Signed)
[facility]  Discharge Instructions: Thank you for choosing [facility] to provide your oncology and hematology care.   If you have a lab appointment with the Cancer Center, please go directly to the Cancer Center and check in at the registration area.   Wear comfortable clothing and clothing appropriate for easy access to any Portacath or PICC line.   We strive to give you quality time with your provider. You may need to reschedule your appointment if you arrive late (15 or more minutes).  Arriving late affects you and other patients whose appointments are after yours.  Also, if you miss three or more appointments without notifying the office, you may be dismissed from the clinic at the provider's discretion.      For prescription refill requests, have your pharmacy contact our office and allow 72 hours for refills to be completed.    Today you received the following chemotherapy and/or immunotherapy agents; Durvalumab (Imfinzi)      To help prevent nausea and vomiting after your treatment, we encourage you to take your nausea medication as directed.  BELOW ARE SYMPTOMS THAT SHOULD BE REPORTED IMMEDIATELY: *FEVER GREATER THAN 100.4 F (38 C) OR HIGHER *CHILLS OR SWEATING *NAUSEA AND VOMITING THAT IS NOT CONTROLLED WITH YOUR NAUSEA MEDICATION *UNUSUAL SHORTNESS OF BREATH *UNUSUAL BRUISING OR BLEEDING *URINARY PROBLEMS (pain or burning when urinating, or frequent urination) *BOWEL PROBLEMS (unusual diarrhea, constipation, pain near the anus) TENDERNESS IN MOUTH AND THROAT WITH OR WITHOUT PRESENCE OF ULCERS (sore throat, sores in mouth, or a toothache) UNUSUAL RASH, SWELLING OR PAIN  UNUSUAL VAGINAL DISCHARGE OR ITCHING   Items with * indicate a potential emergency and should be followed up as soon as possible or go to the Emergency Department if any problems should occur.  Please show the CHEMOTHERAPY ALERT CARD or IMMUNOTHERAPY ALERT CARD at  check-in to the Emergency Department and triage nurse.  Should you have questions after your visit or need to cancel or reschedule your appointment, please contact [facility]  Dept: [phone]  and follow the prompts.  Office hours are 8:00 a.m. to 4:30 p.m. Monday - Friday. Please note that voicemails left after 4:00 p.m. may not be returned until the following business day.  We are closed weekends and major holidays. You have access to a nurse at all times for urgent questions. Please call the main number to the clinic Dept: [phone] and follow the prompts.   For any non-urgent questions, you may also contact your provider using MyChart. We now offer e-Visits for anyone 59 and older to request care online for non-urgent symptoms. For details visit [email or web address].   Also download the MyChart app! Go to the app store, search "MyChart", open the app, select [facility], and log in with your MyChart username and password.

## 2022-02-10 LAB — T4: T4, Total: 5.7 ug/dL (ref 4.5–12.0)

## 2022-02-10 LAB — TSH: TSH: 3.037 u[IU]/mL (ref 0.350–4.500)

## 2022-02-14 ENCOUNTER — Telehealth: Payer: Self-pay | Admitting: Medical Oncology

## 2022-02-14 NOTE — Telephone Encounter (Signed)
Dizzy- holds on to wall at home or walker . Had 3 falls in December. She thinks it was due to sciatia. Drinking adequately .   PCP recently discontinued  HCTZ and reduced lorsartan to 50 mg/day for her dizziness.. I told her to call PCP about her persistent dizziness.  Eyes water. She is using Systane.  Injection --I told pt she should have the injection for sciatica as scheduled. She is only on immunotherapy.

## 2022-02-15 DIAGNOSIS — E0590 Thyrotoxicosis, unspecified without thyrotoxic crisis or storm: Secondary | ICD-10-CM | POA: Diagnosis not present

## 2022-02-17 DIAGNOSIS — E119 Type 2 diabetes mellitus without complications: Secondary | ICD-10-CM | POA: Diagnosis not present

## 2022-02-17 DIAGNOSIS — M1990 Unspecified osteoarthritis, unspecified site: Secondary | ICD-10-CM | POA: Diagnosis not present

## 2022-02-17 DIAGNOSIS — K219 Gastro-esophageal reflux disease without esophagitis: Secondary | ICD-10-CM | POA: Diagnosis not present

## 2022-02-17 DIAGNOSIS — F4321 Adjustment disorder with depressed mood: Secondary | ICD-10-CM | POA: Diagnosis not present

## 2022-02-17 DIAGNOSIS — F411 Generalized anxiety disorder: Secondary | ICD-10-CM | POA: Diagnosis not present

## 2022-02-17 DIAGNOSIS — J309 Allergic rhinitis, unspecified: Secondary | ICD-10-CM | POA: Diagnosis not present

## 2022-02-17 DIAGNOSIS — E785 Hyperlipidemia, unspecified: Secondary | ICD-10-CM | POA: Diagnosis not present

## 2022-02-17 DIAGNOSIS — I10 Essential (primary) hypertension: Secondary | ICD-10-CM | POA: Diagnosis not present

## 2022-02-17 DIAGNOSIS — E0590 Thyrotoxicosis, unspecified without thyrotoxic crisis or storm: Secondary | ICD-10-CM | POA: Diagnosis not present

## 2022-02-21 ENCOUNTER — Ambulatory Visit
Admission: RE | Admit: 2022-02-21 | Discharge: 2022-02-21 | Disposition: A | Discharge status: Home / Self Care | Payer: Medicare PPO | Source: Ambulatory Visit | Attending: Radiation Oncology | Admitting: Radiation Oncology

## 2022-02-21 ENCOUNTER — Telehealth: Payer: Self-pay | Admitting: Medical Oncology

## 2022-02-21 DIAGNOSIS — E0590 Thyrotoxicosis, unspecified without thyrotoxic crisis or storm: Secondary | ICD-10-CM | POA: Diagnosis not present

## 2022-02-21 NOTE — Telephone Encounter (Signed)
Missed rad onc f/u call . I transferred her call to [facility] in rad onc.

## 2022-02-21 NOTE — Progress Notes (Addendum)
  Radiation Oncology         [phone] ________________________________  Name: Vanessa Day MRN: [id number]  Date of Service: 02/21/2022  DOB: [birth year]  Post Treatment Telephone Note  Diagnosis:  Recurrent Progressive Stage IA3, pT1cN0M0, NSCLC, adenocarcinoma of RLL     Intent: Curative  Radiation Treatment Dates: 11/22/2021 through 01/06/2022 Site Technique Total Dose (Gy) Dose per Fx (Gy) Completed Fx Beam Energies  Lung, Right: Lung_R 3D 60/60 2 30/30 6X  Lung, Right: Lung_R_Bst 3D 6/6 2 3/3 6X  (as documented in provider EOT note)   The patient was available for call today.   Symptoms of fatigue have improved since completing therapy.  Symptoms of skin changes have improved since completing therapy.  Symptoms of esophagitis have not improved since completing therapy and patient is having a feeling of fullness after eating only a small amount of food. Also RT sided chest/ axilla pain 7/10, that's managed w/ tylenol and a heating pad.     The patient has scheduled follow up with her medical oncologist Dr. Julien Nordmann for ongoing care, and was encouraged to call if she develops concerns or questions regarding radiation.   This concludes the interview.   Leandra Kern, LPN

## 2022-02-22 ENCOUNTER — Other Ambulatory Visit: Payer: Self-pay

## 2022-02-22 DIAGNOSIS — M5416 Radiculopathy, lumbar region: Secondary | ICD-10-CM | POA: Diagnosis not present

## 2022-02-24 DIAGNOSIS — K209 Esophagitis, unspecified without bleeding: Secondary | ICD-10-CM | POA: Diagnosis not present

## 2022-02-24 DIAGNOSIS — M159 Polyosteoarthritis, unspecified: Secondary | ICD-10-CM | POA: Diagnosis not present

## 2022-02-24 DIAGNOSIS — I10 Essential (primary) hypertension: Secondary | ICD-10-CM | POA: Diagnosis not present

## 2022-02-24 DIAGNOSIS — G4700 Insomnia, unspecified: Secondary | ICD-10-CM | POA: Diagnosis not present

## 2022-02-24 DIAGNOSIS — E0590 Thyrotoxicosis, unspecified without thyrotoxic crisis or storm: Secondary | ICD-10-CM | POA: Diagnosis not present

## 2022-02-24 DIAGNOSIS — C3431 Malignant neoplasm of lower lobe, right bronchus or lung: Secondary | ICD-10-CM | POA: Diagnosis not present

## 2022-02-24 DIAGNOSIS — D84821 Immunodeficiency due to drugs: Secondary | ICD-10-CM | POA: Diagnosis not present

## 2022-02-24 DIAGNOSIS — E538 Deficiency of other specified B group vitamins: Secondary | ICD-10-CM | POA: Diagnosis not present

## 2022-02-24 DIAGNOSIS — F3341 Major depressive disorder, recurrent, in partial remission: Secondary | ICD-10-CM | POA: Diagnosis not present

## 2022-03-02 DIAGNOSIS — J069 Acute upper respiratory infection, unspecified: Secondary | ICD-10-CM | POA: Diagnosis not present

## 2022-03-07 ENCOUNTER — Telehealth: Payer: Self-pay

## 2022-03-07 NOTE — Telephone Encounter (Signed)
Patient called asking if it was okay for her to come for infusion on Wednesday. She has a lingering cough for the past few weeks. She has already seen her PCP and she states her PCP said her lungs are clear. She is negative for Covid as well. She reports no other symptoms. This LPN advises patient to still come for her appointments as long as she feels okay since she has seen her PCP and her Covid test was negative. No further questions or concerns.

## 2022-03-09 ENCOUNTER — Encounter: Payer: Self-pay | Admitting: Internal Medicine

## 2022-03-09 ENCOUNTER — Inpatient Hospital Stay: Payer: Medicare PPO

## 2022-03-09 ENCOUNTER — Inpatient Hospital Stay (HOSPITAL_BASED_OUTPATIENT_CLINIC_OR_DEPARTMENT_OTHER): Payer: Medicare PPO | Admitting: Internal Medicine

## 2022-03-09 ENCOUNTER — Inpatient Hospital Stay: Payer: Medicare PPO | Attending: Internal Medicine

## 2022-03-09 DIAGNOSIS — Z79899 Other long term (current) drug therapy: Secondary | ICD-10-CM | POA: Insufficient documentation

## 2022-03-09 DIAGNOSIS — I480 Paroxysmal atrial fibrillation: Secondary | ICD-10-CM | POA: Insufficient documentation

## 2022-03-09 DIAGNOSIS — Z7982 Long term (current) use of aspirin: Secondary | ICD-10-CM | POA: Diagnosis not present

## 2022-03-09 DIAGNOSIS — C3431 Malignant neoplasm of lower lobe, right bronchus or lung: Secondary | ICD-10-CM

## 2022-03-09 DIAGNOSIS — Z5112 Encounter for antineoplastic immunotherapy: Secondary | ICD-10-CM | POA: Diagnosis not present

## 2022-03-09 DIAGNOSIS — I10 Essential (primary) hypertension: Secondary | ICD-10-CM | POA: Diagnosis not present

## 2022-03-09 DIAGNOSIS — Z923 Personal history of irradiation: Secondary | ICD-10-CM | POA: Insufficient documentation

## 2022-03-09 DIAGNOSIS — D72829 Elevated white blood cell count, unspecified: Secondary | ICD-10-CM | POA: Insufficient documentation

## 2022-03-09 DIAGNOSIS — C3491 Malignant neoplasm of unspecified part of right bronchus or lung: Secondary | ICD-10-CM

## 2022-03-09 DIAGNOSIS — Z95828 Presence of other vascular implants and grafts: Secondary | ICD-10-CM

## 2022-03-09 LAB — CMP (CANCER CENTER ONLY)
ALT: 10 U/L (ref 0–44)
AST: 11 U/L — ABNORMAL LOW (ref 15–41)
Albumin: 3.9 g/dL (ref 3.5–5.0)
Alkaline Phosphatase: 70 U/L (ref 38–126)
Anion gap: 8 (ref 5–15)
BUN: 24 mg/dL — ABNORMAL HIGH (ref 8–23)
CO2: 29 mmol/L (ref 22–32)
Calcium: 9.5 mg/dL (ref 8.9–10.3)
Chloride: 103 mmol/L (ref 98–111)
Creatinine: 1.21 mg/dL — ABNORMAL HIGH (ref 0.44–1.00)
GFR, Estimated: 45 mL/min — ABNORMAL LOW (ref >60)
Glucose, Bld: 116 mg/dL — ABNORMAL HIGH (ref 70–99)
Potassium: 3.8 mmol/L (ref 3.5–5.1)
Sodium: 140 mmol/L (ref 135–145)
Total Bilirubin: 0.7 mg/dL (ref 0.3–1.2)
Total Protein: 6.9 g/dL (ref 6.5–8.1)

## 2022-03-09 LAB — CBC WITH DIFFERENTIAL (CANCER CENTER ONLY)
Abs Immature Granulocytes: 0.07 10*3/uL (ref 0.00–0.07)
Basophils Absolute: 0.1 10*3/uL (ref 0.0–0.1)
Basophils Relative: 0 %
Eosinophils Absolute: 0.1 10*3/uL (ref 0.0–0.5)
Eosinophils Relative: 1 %
HCT: 35.8 % — ABNORMAL LOW (ref 36.0–46.0)
Hemoglobin: 12.2 g/dL (ref 12.0–15.0)
Immature Granulocytes: 1 %
Lymphocytes Relative: 11 %
Lymphs Abs: 1.5 10*3/uL (ref 0.7–4.0)
MCH: 32.1 pg (ref 26.0–34.0)
MCHC: 34.1 g/dL (ref 30.0–36.0)
MCV: 94.2 fL (ref 80.0–100.0)
Monocytes Absolute: 0.8 10*3/uL (ref 0.1–1.0)
Monocytes Relative: 5 %
Neutro Abs: 11.6 10*3/uL — ABNORMAL HIGH (ref 1.7–7.7)
Neutrophils Relative %: 82 %
Platelet Count: 287 10*3/uL (ref 150–400)
RBC: 3.8 MIL/uL — ABNORMAL LOW (ref 3.87–5.11)
RDW: 13.8 % (ref 11.5–15.5)
WBC Count: 14.1 10*3/uL — ABNORMAL HIGH (ref 4.0–10.5)
nRBC: 0 % (ref 0.0–0.2)

## 2022-03-09 MED ORDER — SODIUM CHLORIDE 0.9% FLUSH
10.0000 mL | Freq: Once | INTRAVENOUS | Status: AC
  Administered 2022-03-09: 10 mL

## 2022-03-09 MED ORDER — SODIUM CHLORIDE 0.9 % IV SOLN
1500.0000 mg | Freq: Once | INTRAVENOUS | Status: AC
  Administered 2022-03-09: 1500 mg via INTRAVENOUS
  Filled 2022-03-09: qty 30

## 2022-03-09 MED ORDER — SODIUM CHLORIDE 0.9 % IV SOLN: Freq: Once | INTRAVENOUS | Status: AC

## 2022-03-09 NOTE — Progress Notes (Signed)
[place] [phone]   [phone]  OFFICE PROGRESS NOTE  Vanessa Neer, MD [address] [place] [address]  DIAGNOSIS: Recurrent non-small cell lung cancer presented as a stage IIIa (T1c, N2, M0) non-small cell lung cancer, adenocarcinoma initially diagnosed as T1a (T1c, N0, M0) non-small cell lung cancer, adenocarcinoma presented with right lower lobe lung nodule in June 2020.  PRIOR THERAPY:  1) Right video-assisted thoracoscopy, Wedge resection right lower lobe nodule, Thoracoscopic right lower lobectomy, Lymph node dissection on July 26, 2018 under the care of Dr. Roxan Hockey. 2) Concurrent chemoradiation with weekly carboplatin for AUC of 2 and paclitaxel 45 Mg/M2.  First dose November 22, 2021.  Status post 6 cycles.  CURRENT THERAPY: Consolidation treatment with immunotherapy with Imfinzi 1500 Mg IV every 4 weeks.  First dose February 09, 2022.  Status post 1 cycle.  INTERVAL HISTORY: Vanessa Day 82 y.o. female turns to the clinic today for follow-up visit accompanied by her husband.  The patient is feeling fine today with no concerning complaints except for the mild fatigue.  She also has some chest congestion recently.  She received steroid injection to one of her joint recently.  She denied having any chest pain, shortness of breath or hemoptysis.  She has no nausea, vomiting, diarrhea or constipation.  She has no headache or visual changes.  She has no recent weight loss or night sweats.  She tolerated the first cycle of her treatment with Imfinzi fairly well.   MEDICAL HISTORY: Past Medical History:  Diagnosis Date   Adenocarcinoma of lung, stage 1, right ([place]) 2020   Anxiety    Arthritis    back, feet, jaw - per patient "all over"   Breast cancer ([place]) 2000   Right breast   Family history of breast cancer    GERD (gastroesophageal reflux disease)    Graves disease    H/O hiatal hernia    Hyperlipidemia     Hypertension    IBS (irritable bowel syndrome)    Mitral regurgitation    moderate mitral regurgitation 08/20/20 echo   Neck pain    PAF (paroxysmal atrial fibrillation) ([place])    ~ 09/2020 in setting of hyperthyroidism   PONV (postoperative nausea and vomiting)    Pre-diabetes     ALLERGIES:  is allergic to hydrocodone, codeine, dicyclomine hcl, hyoscyamine, nitrofurantoin, statins, sudafed [pseudoephedrine], oxycodone-acetaminophen, and sulfa antibiotics.  MEDICATIONS:  Current Outpatient Medications  Medication Sig Dispense Refill   acetaminophen (TYLENOL) 500 MG tablet Take 1 tablet (500 mg total) by mouth every 6 (six) hours as needed for mild pain. (Patient taking differently: Take 1,000 mg by mouth every 6 (six) hours as needed for mild pain.) 30 tablet 0   alosetron (LOTRONEX) 0.5 MG tablet Take 0.25 mg by mouth daily as needed (Diarrhea).     aspirin EC 81 MG tablet Take 1 tablet (81 mg total) by mouth daily. Swallow whole. 90 tablet 3   azelastine (ASTELIN) 0.1 % nasal spray Place 1 spray into both nostrils daily as needed (Congestion). Use in each nostril as directed     celecoxib (CELEBREX) 200 MG capsule Take 200 mg by mouth 2 (two) times daily.     cetirizine (ZYRTEC) 10 MG tablet Take 10 mg by mouth daily as needed for allergies.     ezetimibe (ZETIA) 10 MG tablet Take 10 mg by mouth daily.     hydrochlorothiazide (MICROZIDE) 12.5 MG capsule Take 1  capsule (12.5 mg total) by mouth daily. 45 capsule 3   lidocaine-prilocaine (EMLA) cream Apply 1 Application topically as needed. 30 g 2   LORazepam (ATIVAN) 1 MG tablet Take 1-2 mg by mouth at bedtime as needed for sleep.     losartan (COZAAR) 100 MG tablet Take 100 mg by mouth daily.     methimazole (TAPAZOLE) 5 MG tablet Take 5 mg by mouth daily.     oxyCODONE (ROXICODONE) 5 MG immediate release tablet Take 1 tablet (5 mg total) by mouth every 6 (six) hours as needed for severe pain. 7 tablet 0   Polyethyl Glycol-Propyl Glycol  (SYSTANE OP) Place 1 drop into both eyes 2 (two) times daily as needed (dry eyes).     prochlorperazine (COMPAZINE) 10 MG tablet Take 1 tablet (10 mg total) by mouth every 6 (six) hours as needed for nausea or vomiting. 30 tablet 0   senna-docusate (SENOKOT-S) 8.6-50 MG tablet Take 1 tablet by mouth daily. 30 tablet 0   sertraline (ZOLOFT) 50 MG tablet Take 50 mg by mouth at bedtime.     sucralfate (CARAFATE) 1 GM/10ML suspension Take 10 mLs (1 g total) by mouth 4 (four) times daily -  with meals and at bedtime. 420 mL 0   No current facility-administered medications for this visit.   Facility-Administered Medications Ordered in Other Visits  Medication Dose Route Frequency Provider Last Rate Last Admin   sodium chloride flush (NS) 0.9 % injection 10 mL  10 mL Intravenous PRN Curt Bears, MD   10 mL at 01/25/22 1534    SURGICAL HISTORY:  Past Surgical History:  Procedure Laterality Date   ABDOMINAL HYSTERECTOMY     APPENDECTOMY     BACK SURGERY     BREAST BIOPSY     BREAST SURGERY     CARPOMETACARPEL SUSPENSION PLASTY Left 08/25/2015   Procedure: SUSPENSIONPLASTY LEFT THUMB TRAPEZIUM EXCISION ABDUCTOR POLLICIS LONGUS TRANSFER;  Surgeon: Daryll Brod, MD;  Location: [facility];  Service: Orthopedics;  Laterality: Left;   CHOLECYSTECTOMY     COLONOSCOPY     several   EYE SURGERY     cataract right and left eye   GAS INSERTION  06/23/2011   Procedure: INSERTION OF GAS;  Surgeon: Hayden Pedro, MD;  Location: [facility];  Service: Ophthalmology;  Laterality: Left;  SF6   IR IMAGING GUIDED PORT INSERTION  12/14/2021   LOBECTOMY Right 07/26/2018   Procedure: RIGHT LOWER LOBE LOBECTOMY;  Surgeon: Melrose Nakayama, MD;  Location: [facility];  Service: Thoracic;  Laterality: Right;   MASTECTOMY     right breast   SCLERAL BUCKLE  06/23/2011   Procedure: SCLERAL BUCKLE;  Surgeon: Hayden Pedro, MD;  Location: [facility];  Service: Ophthalmology;  Laterality: Left;   SEGMENTECOMY  Right 07/26/2018   Procedure: SEGMENTECTOMY;  Surgeon: Melrose Nakayama, MD;  Location: [facility];  Service: Thoracic;  Laterality: Right;   TONSILLECTOMY     VIDEO ASSISTED THORACOSCOPY Right 07/26/2018   Procedure: VIDEO ASSISTED THORACOSCOPY;  Surgeon: Melrose Nakayama, MD;  Location: [facility];  Service: Thoracic;  Laterality: Right;   VIDEO BRONCHOSCOPY WITH ENDOBRONCHIAL ULTRASOUND N/A 10/27/2021   Procedure: VIDEO BRONCHOSCOPY WITH ENDOBRONCHIAL ULTRASOUND;  Surgeon: Melrose Nakayama, MD;  [facility];  Service: Thoracic;  Laterality: N/A;    REVIEW OF SYSTEMS:  A comprehensive review of systems was negative except for: Constitutional: positive for fatigue Respiratory: positive for dyspnea on exertion   PHYSICAL EXAMINATION: General  appearance: alert, cooperative, fatigued, and no distress Head: Normocephalic, without obvious abnormality, atraumatic Neck: no adenopathy, no JVD, supple, symmetrical, trachea midline, and thyroid not enlarged, symmetric, no tenderness/mass/nodules Lymph nodes: Cervical, supraclavicular, and axillary nodes normal. Resp: clear to auscultation bilaterally Back: symmetric, no curvature. ROM normal. No CVA tenderness. Cardio: regular rate and rhythm, S1, S2 normal, no murmur, click, rub or gallop GI: soft, non-tender; bowel sounds normal; no masses,  no organomegaly Extremities: extremities normal, atraumatic, no cyanosis or edema  ECOG PERFORMANCE STATUS: 1 - Symptomatic but completely ambulatory   Blood pressure 134/79, pulse 93, temperature 98.2 F (36.8 C), temperature source Oral, resp. rate 17, weight 137 lb 2 oz (62.2 kg), SpO2 100 %.  LABORATORY DATA: Lab Results  Component Value Date   WBC 14.1 (H) 03/09/2022   HGB 12.2 03/09/2022   HCT 35.8 (L) 03/09/2022   MCV 94.2 03/09/2022   PLT 287 03/09/2022      Chemistry      Component Value Date/Time   NA 143 02/09/2022 1418   K 3.7 02/09/2022 1418   CL 110 02/09/2022 1418   CO2  27 02/09/2022 1418   BUN 26 (H) 02/09/2022 1418   CREATININE 1.25 (H) 02/09/2022 1418      Component Value Date/Time   CALCIUM 8.7 (L) 02/09/2022 1418   ALKPHOS 59 02/09/2022 1418   AST 20 02/09/2022 1418   ALT 20 02/09/2022 1418   BILITOT 1.0 02/09/2022 1418       RADIOGRAPHIC STUDIES: No results found.   ASSESSMENT AND PLAN: This is a very pleasant 82 years old white female with stage IIIA (T1c, N2, M0) non-small cell lung cancer, adenocarcinoma initially diagnosed as diagnosed with a stage IA (T1c, N0, M0) non-small cell lung cancer, adenocarcinoma status post right lower lobectomy with lymph node dissection in July 2020.  She had disease recurrence in October 2023. The patient is currently on observation and she is feeling fine except for the dry cough. Her CT scan of the chest followed by PET scan showed increasing nodularity of the pleural surface in the right chest with ill-defined areas in the cardiophrenic recess and increasing size of lymph nodes in the mediastinum. The patient underwent bronchoscopy with EBUS under the care of Dr. 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 The patient started a course of concurrent chemoradiation with weekly carboplatin for AUC of 2 and paclitaxel 45 Mg/M2 status post 7 cycles.  She tolerated this treatment well except for the radiation-induced esophagitis as well as fatigue. The patient had repeat CT scan of the chest performed recently.  Her scan showed improvement of her disease especially in the mediastinal lymph nodes. The patient is currently undergoing consolidation treatment with immunotherapy with Imfinzi 1500 Mg IV every 4 weeks status post 1 cycle.  She tolerated the first cycle of her treatment well except for fatigue. I recommended for her to proceed with cycle #2 today as planned. She has leukocytosis but this likely secondary to her treatment with steroid  injection for the arthritis. The patient will come back for follow-up visit in 4 weeks for evaluation before starting cycle #3. She was advised to call immediately if she has any concerning symptoms in the interval. The patient voices understanding of current disease status and treatment options and is in agreement with the current care plan. All questions were answered. The patient knows to call the clinic with any problems, questions or concerns. We can certainly  see the patient much sooner if necessary. The total time spent in the appointment was 20 minutes.  Disclaimer: This note was dictated with voice recognition software. Similar sounding words can inadvertently be transcribed and may not be corrected upon review.

## 2022-03-10 ENCOUNTER — Other Ambulatory Visit: Payer: Self-pay

## 2022-03-15 DIAGNOSIS — I480 Paroxysmal atrial fibrillation: Secondary | ICD-10-CM | POA: Diagnosis not present

## 2022-03-15 DIAGNOSIS — M1990 Unspecified osteoarthritis, unspecified site: Secondary | ICD-10-CM | POA: Diagnosis not present

## 2022-03-15 DIAGNOSIS — E0590 Thyrotoxicosis, unspecified without thyrotoxic crisis or storm: Secondary | ICD-10-CM | POA: Diagnosis not present

## 2022-03-15 DIAGNOSIS — N289 Disorder of kidney and ureter, unspecified: Secondary | ICD-10-CM | POA: Diagnosis not present

## 2022-03-15 DIAGNOSIS — I10 Essential (primary) hypertension: Secondary | ICD-10-CM | POA: Diagnosis not present

## 2022-03-15 DIAGNOSIS — M109 Gout, unspecified: Secondary | ICD-10-CM | POA: Diagnosis not present

## 2022-03-15 DIAGNOSIS — M5136 Other intervertebral disc degeneration, lumbar region: Secondary | ICD-10-CM | POA: Diagnosis not present

## 2022-03-15 DIAGNOSIS — M130 Polyarthritis, unspecified: Secondary | ICD-10-CM | POA: Diagnosis not present

## 2022-03-22 ENCOUNTER — Other Ambulatory Visit: Payer: Medicare PPO

## 2022-03-24 ENCOUNTER — Ambulatory Visit: Payer: Medicare PPO | Admitting: Internal Medicine

## 2022-03-28 DIAGNOSIS — M5416 Radiculopathy, lumbar region: Secondary | ICD-10-CM | POA: Diagnosis not present

## 2022-03-29 ENCOUNTER — Encounter: Payer: Self-pay | Admitting: Internal Medicine

## 2022-03-30 ENCOUNTER — Other Ambulatory Visit: Payer: Self-pay | Admitting: Medical Oncology

## 2022-04-03 NOTE — Progress Notes (Unsigned)
[facility] OFFICE PROGRESS NOTE  [name], MD [address] [place] [address]  DIAGNOSIS: Recurrent non-small cell lung cancer presented as a stage IIIa (T1c, N2, M0) non-small cell lung cancer, adenocarcinoma initially diagnosed as T1a (T1c, N0, M0) non-small cell lung cancer, adenocarcinoma presented with right lower lobe lung nodule in June 2020.   PDL1: 90%    Molecular Studies: KRAS G12C  PRIOR THERAPY: 1) Right video-assisted thoracoscopy, Wedge resection right lower lobe nodule, Thoracoscopic right lower lobectomy, Lymph node dissection on July 26, 2018 under the care of Dr. Roxan Hockey. 2) Concurrent chemoradiation with weekly carboplatin for AUC of 2 and paclitaxel 45 Mg/M2.  First dose November 22, 2021.  Status post 6 cycles.  CURRENT THERAPY:  Consolidation treatment with immunotherapy with Imfinzi 1500 Mg IV every 4 weeks.  First dose February 09, 2022.  Status post 2 cycles   INTERVAL HISTORY: Vanessa Day 82 y.o. female returns to the clinic for a follow up visit accompanied by her husband. The patient is feeling well today without any concerning complaints except she struggles with sciatica and back pain.  He previously received steroid joint injections in her back without significant improvement.  She mention she is not a candidate for any invasive surgeries.  She did state that they did talk to her about a nerve stimulator which she is considering.  Because of the pain down her left leg, the patient is not as active as she would like to be and she reports some fatigue.  Her activity is limited due to pain.  She is wondering if there is any multivitamin she can take for fatigue.  Of note she does receive B12 injections through her PCPs office.  She has an appointment with her PCP tomorrow and she is wondering if we could fax a copy of her lab work from today.  The patient continues to tolerate treatment with consolidation immunotherapy well  without any adverse effects. Denies any fever, chills, night sweats, or weight loss. Denies any chest pain, shortness of breath, cough, or hemoptysis. Denies any nausea, vomiting, or diarrhea.  She reports she frequently has constipation due to her pain medicine use for her back.  She used to be on a stool softener but has not been taking this lately.  Denies any headache or visual changes. Denies any rashes or skin changes. The patient is here today for evaluation prior to starting cycle # 3.    MEDICAL HISTORY: Past Medical History:  Diagnosis Date   Adenocarcinoma of lung, stage 1, right ([place]) 2020   Anxiety    Arthritis    back, feet, jaw - per patient "all over"   Breast cancer ([place]) 2000   Right breast   Family history of breast cancer    GERD (gastroesophageal reflux disease)    Graves disease    H/O hiatal hernia    Hyperlipidemia    Hypertension    IBS (irritable bowel syndrome)    Mitral regurgitation    moderate mitral regurgitation 08/20/20 echo   Neck pain    PAF (paroxysmal atrial fibrillation) ([place] [place])    ~ 09/2020 in setting of hyperthyroidism   PONV (postoperative nausea and vomiting)    Pre-diabetes     ALLERGIES:  is allergic to hydrocodone, amitriptyline hcl, angiotensin receptor blockers, codeine, dicyclomine hcl, hyoscyamine, nitrofurantoin, statins, tramadol, oxycodone-acetaminophen, pseudoephedrine, and sulfa antibiotics.  MEDICATIONS:  Current Outpatient Medications  Medication Sig Dispense Refill   acetaminophen (TYLENOL) 500  MG tablet Take 1 tablet (500 mg total) by mouth every 6 (six) hours as needed for mild pain. (Patient taking differently: Take 1,000 mg by mouth every 6 (six) hours as needed for mild pain.) 30 tablet 0   aspirin EC 81 MG tablet Take 1 tablet (81 mg total) by mouth daily. Swallow whole. 90 tablet 3   azelastine (ASTELIN) 0.1 % nasal spray Place 1 spray into both nostrils daily as needed (Congestion). Use in each nostril as directed      celecoxib (CELEBREX) 200 MG capsule Take 200 mg by mouth 2 (two) times daily.     cetirizine (ZYRTEC) 10 MG tablet Take 10 mg by mouth daily as needed for allergies.     diphenoxylate-atropine (LOMOTIL) 2.5-0.025 MG tablet Take 1 tablet by mouth. As directed     durvalumab (IMFINZI) 500 MG/10ML SOLN injection Inject 1,500 mg into the vein every 28 (twenty-eight) days.     ezetimibe (ZETIA) 10 MG tablet Take 10 mg by mouth daily.     hydrochlorothiazide (MICROZIDE) 12.5 MG capsule Take 1 capsule (12.5 mg total) by mouth daily. 45 capsule 3   lidocaine-prilocaine (EMLA) cream Apply 1 Application topically as needed. 30 g 2   LORazepam (ATIVAN) 1 MG tablet Take 1-2 mg by mouth at bedtime as needed for sleep.     methimazole (TAPAZOLE) 5 MG tablet Take 5 mg by mouth daily.     oxyCODONE (ROXICODONE) 5 MG immediate release tablet Take 1 tablet (5 mg total) by mouth every 6 (six) hours as needed for severe pain. 7 tablet 0   Polyethyl Glycol-Propyl Glycol (SYSTANE OP) Place 1 drop into both eyes 2 (two) times daily as needed (dry eyes).     prochlorperazine (COMPAZINE) 10 MG tablet Take 1 tablet (10 mg total) by mouth every 6 (six) hours as needed for nausea or vomiting. 30 tablet 0   senna-docusate (SENOKOT-S) 8.6-50 MG tablet Take 1 tablet by mouth daily. 30 tablet 0   No current facility-administered medications for this visit.   Facility-Administered Medications Ordered in Other Visits  Medication Dose Route Frequency Provider Last Rate Last Admin   sodium chloride flush (NS) 0.9 % injection 10 mL  10 mL Intravenous PRN Curt Bears, MD   10 mL at 01/25/22 1534    SURGICAL HISTORY:  Past Surgical History:  Procedure Laterality Date   ABDOMINAL HYSTERECTOMY     APPENDECTOMY     BACK SURGERY     BREAST BIOPSY     BREAST SURGERY     CARPOMETACARPEL SUSPENSION PLASTY Left 08/25/2015   Procedure: SUSPENSIONPLASTY LEFT THUMB TRAPEZIUM EXCISION ABDUCTOR POLLICIS LONGUS TRANSFER;  Surgeon:  Daryll Brod, MD;  Location: [facility];  Service: Orthopedics;  Laterality: Left;   CHOLECYSTECTOMY     COLONOSCOPY     several   EYE SURGERY     cataract right and left eye   GAS INSERTION  06/23/2011   Procedure: INSERTION OF GAS;  Surgeon: Hayden Pedro, MD;  Location: [facility];  Service: Ophthalmology;  Laterality: Left;  SF6   IR IMAGING GUIDED PORT INSERTION  12/14/2021   LOBECTOMY Right 07/26/2018   Procedure: RIGHT LOWER LOBE LOBECTOMY;  Surgeon: Melrose Nakayama, MD;  Location: [facility];  Service: Thoracic;  Laterality: Right;   MASTECTOMY     right breast   SCLERAL BUCKLE  06/23/2011   Procedure: SCLERAL BUCKLE;  Surgeon: Hayden Pedro, MD;  Location: [facility];  Service: Ophthalmology;  Laterality: Left;  SEGMENTECOMY Right 07/26/2018   Procedure: SEGMENTECTOMY;  Surgeon: Melrose Nakayama, MD;  Location: [facility];  Service: Thoracic;  Laterality: Right;   TONSILLECTOMY     VIDEO ASSISTED THORACOSCOPY Right 07/26/2018   Procedure: VIDEO ASSISTED THORACOSCOPY;  Surgeon: Melrose Nakayama, MD;  Location: [facility];  Service: Thoracic;  Laterality: Right;   VIDEO BRONCHOSCOPY WITH ENDOBRONCHIAL ULTRASOUND N/A 10/27/2021   Procedure: VIDEO BRONCHOSCOPY WITH ENDOBRONCHIAL ULTRASOUND;  Surgeon: Melrose Nakayama, MD;  [facility];  Service: Thoracic;  Laterality: N/A;    REVIEW OF SYSTEMS:   Review of Systems  Constitutional: Positive for fatigue.  Negative for appetite change, chills, fever and unexpected weight change.  HENT:   Negative for mouth sores, nosebleeds, sore throat and trouble swallowing.   Eyes: Negative for eye problems and icterus.  Respiratory: Negative for cough, hemoptysis, shortness of breath and wheezing.   Cardiovascular: Negative for chest pain and leg swelling.  Gastrointestinal: Positive for occasional constipation.  Negative for abdominal pain, diarrhea, nausea and vomiting.  Genitourinary: Negative for bladder incontinence,  difficulty urinating, dysuria, frequency and hematuria.   Musculoskeletal: Positive for back pain. Negative for gait problem, neck pain and neck stiffness.  Skin: Negative for itching and rash.  Neurological: Negative for dizziness, extremity weakness, gait problem, headaches, light-headedness and seizures.  Hematological: Negative for adenopathy. Does not bruise/bleed easily.  Psychiatric/Behavioral: Negative for confusion, depression and sleep disturbance. The patient is not nervous/anxious.     PHYSICAL EXAMINATION:  Blood pressure 125/72, pulse 93, temperature 98.1 F (36.7 C), resp. rate 18, weight 137 lb 11.2 oz (62.5 kg), SpO2 98 %.  ECOG PERFORMANCE STATUS: 1  Physical Exam  Constitutional: Oriented to person, place, and time and well-developed, well-nourished, and in no distress.  HENT:  Head: Normocephalic and atraumatic.  Mouth/Throat: Oropharynx is clear and moist. No oropharyngeal exudate.  Eyes: Conjunctivae are normal. Right eye exhibits no discharge. Left eye exhibits no discharge. No scleral icterus.  Neck: Normal range of motion. Neck supple.  Cardiovascular: Normal rate, regular rhythm, normal heart sounds and intact distal pulses.   Pulmonary/Chest: Effort normal and breath sounds normal. No respiratory distress. No wheezes. No rales.  Abdominal: Soft. Bowel sounds are normal. Exhibits no distension and no mass. There is no tenderness.  Musculoskeletal: Normal range of motion. Exhibits no edema.  Lymphadenopathy:    No cervical adenopathy.  Neurological: Alert and oriented to person, place, and time. Exhibits normal muscle tone. Gait normal. Coordination normal.  Skin: Skin is warm and dry. No rash noted. Not diaphoretic. No erythema. No pallor.  Psychiatric: Mood, memory and judgment normal.  Vitals reviewed.  LABORATORY DATA: Lab Results  Component Value Date   WBC 8.4 04/06/2022   HGB 13.2 04/06/2022   HCT 39.1 04/06/2022   MCV 93.8 04/06/2022   PLT 260  04/06/2022      Chemistry      Component Value Date/Time   NA 140 04/06/2022 0838   K 3.3 (L) 04/06/2022 0838   CL 102 04/06/2022 0838   CO2 27 04/06/2022 0838   BUN 30 (H) 04/06/2022 0838   CREATININE 1.17 (H) 04/06/2022 0838      Component Value Date/Time   CALCIUM 9.3 04/06/2022 0838   ALKPHOS 71 04/06/2022 0838   AST 17 04/06/2022 0838   ALT 15 04/06/2022 0838   BILITOT 1.1 04/06/2022 0838       RADIOGRAPHIC STUDIES:  No results found.   ASSESSMENT/PLAN:  This is a very pleasant 82 year old Caucasian  female diagnosed with stage IIIa (T1c, N2, M0) non-small cell lung cancer, adenocarcinoma.  The patient was initially diagnosed as a stage Ia (T1c, N0, M0) non-small cell lung cancer, adenocarcinoma.  The patient is status post a right lower lobectomy with lymph node dissection July 2020.  She had disease recurrence in October 2023.  Her PD-L1 expression is 90%.  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  The patient started a course of concurrent chemoradiation with weekly carboplatin for AUC of 2 and paclitaxel 45 Mg/M2 status post 7 cycles. She tolerated this treatment well except for the radiation-induced esophagitis as well as fatigue.   The patient is currently undergoing consolidation treatment with immunotherapy with Imfinzi 1500 Mg IV every 4 weeks status post 2 cycles. She tolerated the first cycle of her treatment well except for fatigue.    Labs were reviewed. Recommend that she proceed with cycle # 3 today as scheduled.   I will arrange for a repeat CT of the chest prior to her next appointment.   We will see her back for a follow up visit in 4 weeks before starting cycle #4.   She is seeing her PCP tomorrow.  Her blood sugar was up a little bit today at 214 which is higher than what is normal for her.   I asked her to discuss this with her PCP tomorrow and consider an A1c.  We will fax over a copy of her CBC, TSH, and CMP to her PCP today.  The patient was wondering if she could get the shingles vaccine.  That is okay from our standpoint.  Regarding her fatigue, she can take a multivitamin.  She is also receiving B12 injections through her PCPs office.  We also discussed increasing her activity, although this is limited secondary to pain.  Pool exercises may be helpful to avoid any pressure on the joints.  She will talk to her other back pain providers about possibly getting a nerve stimulator.  We also discussed constipation education today.  Since she is on oxycodone which is causing constipation, discussed it may be a good idea to get on a bowel regimen.  The patient was advised to call immediately if she has any concerning symptoms in the interval. The patient voices understanding of current disease status and treatment options and is in agreement with the current care plan. All questions were answered. The patient knows to call the clinic with any problems, questions or concerns. We can certainly see the patient much sooner if necessary   Orders Placed This Encounter  Procedures   CT Chest W Contrast    Standing Status:   Future    Standing Expiration Date:   04/06/2023    Order Specific Question:   If indicated for the ordered procedure, I authorize the administration of contrast media per Radiology protocol    Answer:   Yes    Order Specific Question:   Does the patient have a contrast media/X-ray dye allergy?    Answer:   No    Order Specific Question:   Preferred imaging location?    Answer:   [facility]    The total time spent in the appointment was 20-29 minutes.   Royalti Schauf L Jaycie Kregel, PA-C 04/06/22

## 2022-04-04 DIAGNOSIS — M5416 Radiculopathy, lumbar region: Secondary | ICD-10-CM | POA: Diagnosis not present

## 2022-04-04 DIAGNOSIS — M47816 Spondylosis without myelopathy or radiculopathy, lumbar region: Secondary | ICD-10-CM | POA: Diagnosis not present

## 2022-04-06 ENCOUNTER — Inpatient Hospital Stay: Payer: Medicare PPO | Attending: Internal Medicine | Admitting: Physician Assistant

## 2022-04-06 ENCOUNTER — Other Ambulatory Visit: Payer: Medicare PPO

## 2022-04-06 ENCOUNTER — Inpatient Hospital Stay: Payer: Medicare PPO

## 2022-04-06 VITALS — BP 125/72 | HR 93 | Temp 98.1°F | Resp 18 | Wt 137.7 lb

## 2022-04-06 DIAGNOSIS — I480 Paroxysmal atrial fibrillation: Secondary | ICD-10-CM | POA: Diagnosis not present

## 2022-04-06 DIAGNOSIS — Z923 Personal history of irradiation: Secondary | ICD-10-CM | POA: Diagnosis not present

## 2022-04-06 DIAGNOSIS — Z95828 Presence of other vascular implants and grafts: Secondary | ICD-10-CM

## 2022-04-06 DIAGNOSIS — M5430 Sciatica, unspecified side: Secondary | ICD-10-CM | POA: Insufficient documentation

## 2022-04-06 DIAGNOSIS — M549 Dorsalgia, unspecified: Secondary | ICD-10-CM | POA: Insufficient documentation

## 2022-04-06 DIAGNOSIS — I10 Essential (primary) hypertension: Secondary | ICD-10-CM | POA: Diagnosis not present

## 2022-04-06 DIAGNOSIS — Z79899 Other long term (current) drug therapy: Secondary | ICD-10-CM | POA: Insufficient documentation

## 2022-04-06 DIAGNOSIS — Z902 Acquired absence of lung [part of]: Secondary | ICD-10-CM | POA: Insufficient documentation

## 2022-04-06 DIAGNOSIS — Z7982 Long term (current) use of aspirin: Secondary | ICD-10-CM | POA: Diagnosis not present

## 2022-04-06 DIAGNOSIS — C3431 Malignant neoplasm of lower lobe, right bronchus or lung: Secondary | ICD-10-CM | POA: Diagnosis not present

## 2022-04-06 DIAGNOSIS — C3491 Malignant neoplasm of unspecified part of right bronchus or lung: Secondary | ICD-10-CM

## 2022-04-06 DIAGNOSIS — Z5112 Encounter for antineoplastic immunotherapy: Secondary | ICD-10-CM | POA: Diagnosis not present

## 2022-04-06 LAB — CBC WITH DIFFERENTIAL (CANCER CENTER ONLY)
Abs Immature Granulocytes: 0.02 10*3/uL (ref 0.00–0.07)
Basophils Absolute: 0 10*3/uL (ref 0.0–0.1)
Basophils Relative: 1 %
Eosinophils Absolute: 0.1 10*3/uL (ref 0.0–0.5)
Eosinophils Relative: 1 %
HCT: 39.1 % (ref 36.0–46.0)
Hemoglobin: 13.2 g/dL (ref 12.0–15.0)
Immature Granulocytes: 0 %
Lymphocytes Relative: 13 %
Lymphs Abs: 1.1 10*3/uL (ref 0.7–4.0)
MCH: 31.7 pg (ref 26.0–34.0)
MCHC: 33.8 g/dL (ref 30.0–36.0)
MCV: 93.8 fL (ref 80.0–100.0)
Monocytes Absolute: 0.6 10*3/uL (ref 0.1–1.0)
Monocytes Relative: 7 %
Neutro Abs: 6.6 10*3/uL (ref 1.7–7.7)
Neutrophils Relative %: 78 %
Platelet Count: 260 10*3/uL (ref 150–400)
RBC: 4.17 MIL/uL (ref 3.87–5.11)
RDW: 13.4 % (ref 11.5–15.5)
WBC Count: 8.4 10*3/uL (ref 4.0–10.5)
nRBC: 0 % (ref 0.0–0.2)

## 2022-04-06 LAB — CMP (CANCER CENTER ONLY)
ALT: 15 U/L (ref 0–44)
AST: 17 U/L (ref 15–41)
Albumin: 4 g/dL (ref 3.5–5.0)
Alkaline Phosphatase: 71 U/L (ref 38–126)
Anion gap: 11 (ref 5–15)
BUN: 30 mg/dL — ABNORMAL HIGH (ref 8–23)
CO2: 27 mmol/L (ref 22–32)
Calcium: 9.3 mg/dL (ref 8.9–10.3)
Chloride: 102 mmol/L (ref 98–111)
Creatinine: 1.17 mg/dL — ABNORMAL HIGH (ref 0.44–1.00)
GFR, Estimated: 47 mL/min — ABNORMAL LOW (ref >60)
Glucose, Bld: 214 mg/dL — ABNORMAL HIGH (ref 70–99)
Potassium: 3.3 mmol/L — ABNORMAL LOW (ref 3.5–5.1)
Sodium: 140 mmol/L (ref 135–145)
Total Bilirubin: 1.1 mg/dL (ref 0.3–1.2)
Total Protein: 6.9 g/dL (ref 6.5–8.1)

## 2022-04-06 LAB — TSH: TSH: 2.722 u[IU]/mL (ref 0.350–4.500)

## 2022-04-06 MED ORDER — SODIUM CHLORIDE 0.9% FLUSH
10.0000 mL | Freq: Once | INTRAVENOUS | Status: AC
  Administered 2022-04-06: 10 mL

## 2022-04-06 MED ORDER — SODIUM CHLORIDE 0.9 % IV SOLN: Freq: Once | INTRAVENOUS | Status: AC

## 2022-04-06 MED ORDER — SODIUM CHLORIDE 0.9 % IV SOLN
1500.0000 mg | Freq: Once | INTRAVENOUS | Status: AC
  Administered 2022-04-06: 1500 mg via INTRAVENOUS
  Filled 2022-04-06: qty 30

## 2022-04-06 MED ORDER — SODIUM CHLORIDE 0.9% FLUSH
10.0000 mL | INTRAVENOUS | Status: DC | PRN
  Administered 2022-04-06: 10 mL

## 2022-04-06 MED ORDER — HEPARIN SOD (PORK) LOCK FLUSH 100 UNIT/ML IV SOLN
500.0000 [IU] | Freq: Once | INTRAVENOUS | Status: AC | PRN
  Administered 2022-04-06: 500 [IU]

## 2022-04-06 NOTE — Progress Notes (Signed)
Patient seen by PA today  Vitals are within treatment parameters.  Labs reviewed: and are within treatment parameters.  Per physician team, patient is ready for treatment and there are NO modifications to the treatment plan.  

## 2022-04-06 NOTE — Progress Notes (Signed)
This nurse faxed lab results to [facility].  Phone: [phone] Fax [phone]. Faxed to the attention of Eilene Ghazi.

## 2022-04-06 NOTE — Patient Instructions (Signed)
[facility]  Discharge Instructions: Thank you for choosing [facility] to provide your oncology and hematology care.   If you have a lab appointment with the [facility], please go directly to the [facility] and check in at the registration area.   Wear comfortable clothing and clothing appropriate for easy access to any Portacath or PICC line.   We strive to give you quality time with your provider. You may need to reschedule your appointment if you arrive late (15 or more minutes).  Arriving late affects you and other patients whose appointments are after yours.  Also, if you miss three or more appointments without notifying the office, you may be dismissed from the clinic at the provider's discretion.      For prescription refill requests, have your pharmacy contact our office and allow 72 hours for refills to be completed.    Today you received the following chemotherapy and/or immunotherapy agents; Durvalumab (Imfinzi)      To help prevent nausea and vomiting after your treatment, we encourage you to take your nausea medication as directed.  BELOW ARE SYMPTOMS THAT SHOULD BE REPORTED IMMEDIATELY: *FEVER GREATER THAN 100.4 F (38 C) OR HIGHER *CHILLS OR SWEATING *NAUSEA AND VOMITING THAT IS NOT CONTROLLED WITH YOUR NAUSEA MEDICATION *UNUSUAL SHORTNESS OF BREATH *UNUSUAL BRUISING OR BLEEDING *URINARY PROBLEMS (pain or burning when urinating, or frequent urination) *BOWEL PROBLEMS (unusual diarrhea, constipation, pain near the anus) TENDERNESS IN MOUTH AND THROAT WITH OR WITHOUT PRESENCE OF ULCERS (sore throat, sores in mouth, or a toothache) UNUSUAL RASH, SWELLING OR PAIN  UNUSUAL VAGINAL DISCHARGE OR ITCHING   Items with * indicate a potential emergency and should be followed up as soon as possible or go to the Emergency Department if any problems should occur.  Please show the CHEMOTHERAPY ALERT CARD or IMMUNOTHERAPY ALERT  CARD at check-in to the Emergency Department and triage nurse.  Should you have questions after your visit or need to cancel or reschedule your appointment, please contact [facility]  Dept: [phone]  and follow the prompts.  Office hours are 8:00 a.m. to 4:30 p.m. Monday - Friday. Please note that voicemails left after 4:00 p.m. may not be returned until the following business day.  We are closed weekends and major holidays. You have access to a nurse at all times for urgent questions. Please call the main number to the clinic Dept: [phone] and follow the prompts.   For any non-urgent questions, you may also contact your provider using MyChart. We now offer e-Visits for anyone 8 and older to request care online for non-urgent symptoms. For details visit [email or web address].   Also download the MyChart app! Go to the app store, search "MyChart", open the app, select [facility], and log in with your MyChart username and password.

## 2022-04-07 DIAGNOSIS — C3431 Malignant neoplasm of lower lobe, right bronchus or lung: Secondary | ICD-10-CM | POA: Diagnosis not present

## 2022-04-07 DIAGNOSIS — M4800 Spinal stenosis, site unspecified: Secondary | ICD-10-CM | POA: Diagnosis not present

## 2022-04-07 DIAGNOSIS — G4700 Insomnia, unspecified: Secondary | ICD-10-CM | POA: Diagnosis not present

## 2022-04-07 DIAGNOSIS — M159 Polyosteoarthritis, unspecified: Secondary | ICD-10-CM | POA: Diagnosis not present

## 2022-04-07 DIAGNOSIS — I10 Essential (primary) hypertension: Secondary | ICD-10-CM | POA: Diagnosis not present

## 2022-04-07 DIAGNOSIS — D84821 Immunodeficiency due to drugs: Secondary | ICD-10-CM | POA: Diagnosis not present

## 2022-04-08 LAB — T4: T4, Total: 7 ug/dL (ref 4.5–12.0)

## 2022-04-26 ENCOUNTER — Other Ambulatory Visit: Payer: Self-pay

## 2022-04-29 ENCOUNTER — Other Ambulatory Visit: Payer: Self-pay

## 2022-05-01 ENCOUNTER — Other Ambulatory Visit: Payer: Self-pay

## 2022-05-03 ENCOUNTER — Ambulatory Visit (HOSPITAL_COMMUNITY)
Admission: RE | Admit: 2022-05-03 | Discharge: 2022-05-03 | Disposition: A | Discharge status: Home / Self Care | Payer: Medicare PPO | Source: Ambulatory Visit | Attending: Physician Assistant | Admitting: Physician Assistant

## 2022-05-03 DIAGNOSIS — C3490 Malignant neoplasm of unspecified part of unspecified bronchus or lung: Secondary | ICD-10-CM | POA: Diagnosis not present

## 2022-05-03 DIAGNOSIS — C3431 Malignant neoplasm of lower lobe, right bronchus or lung: Secondary | ICD-10-CM | POA: Diagnosis not present

## 2022-05-03 DIAGNOSIS — R918 Other nonspecific abnormal finding of lung field: Secondary | ICD-10-CM | POA: Diagnosis not present

## 2022-05-03 MED ORDER — IOHEXOL 300 MG/ML  SOLN
75.0000 mL | Freq: Once | INTRAMUSCULAR | Status: AC | PRN
  Administered 2022-05-03: 75 mL via INTRAVENOUS

## 2022-05-04 ENCOUNTER — Inpatient Hospital Stay: Payer: Medicare PPO | Attending: Internal Medicine | Admitting: Internal Medicine

## 2022-05-04 ENCOUNTER — Encounter: Payer: Self-pay | Admitting: Medical Oncology

## 2022-05-04 ENCOUNTER — Other Ambulatory Visit: Payer: Medicare PPO

## 2022-05-04 ENCOUNTER — Inpatient Hospital Stay: Payer: Medicare PPO

## 2022-05-04 ENCOUNTER — Other Ambulatory Visit: Payer: Self-pay

## 2022-05-04 VITALS — BP 128/68 | HR 98 | Temp 98.0°F | Resp 16 | Wt 134.2 lb

## 2022-05-04 DIAGNOSIS — C3431 Malignant neoplasm of lower lobe, right bronchus or lung: Secondary | ICD-10-CM | POA: Diagnosis not present

## 2022-05-04 DIAGNOSIS — I480 Paroxysmal atrial fibrillation: Secondary | ICD-10-CM | POA: Insufficient documentation

## 2022-05-04 DIAGNOSIS — C3490 Malignant neoplasm of unspecified part of unspecified bronchus or lung: Secondary | ICD-10-CM

## 2022-05-04 DIAGNOSIS — J984 Other disorders of lung: Secondary | ICD-10-CM | POA: Diagnosis not present

## 2022-05-04 DIAGNOSIS — E785 Hyperlipidemia, unspecified: Secondary | ICD-10-CM | POA: Diagnosis not present

## 2022-05-04 DIAGNOSIS — Z902 Acquired absence of lung [part of]: Secondary | ICD-10-CM | POA: Insufficient documentation

## 2022-05-04 DIAGNOSIS — C3491 Malignant neoplasm of unspecified part of right bronchus or lung: Secondary | ICD-10-CM

## 2022-05-04 DIAGNOSIS — Z79899 Other long term (current) drug therapy: Secondary | ICD-10-CM | POA: Insufficient documentation

## 2022-05-04 DIAGNOSIS — T50905A Adverse effect of unspecified drugs, medicaments and biological substances, initial encounter: Secondary | ICD-10-CM | POA: Diagnosis not present

## 2022-05-04 DIAGNOSIS — Z923 Personal history of irradiation: Secondary | ICD-10-CM | POA: Insufficient documentation

## 2022-05-04 DIAGNOSIS — Z7982 Long term (current) use of aspirin: Secondary | ICD-10-CM | POA: Diagnosis not present

## 2022-05-04 DIAGNOSIS — Z95828 Presence of other vascular implants and grafts: Secondary | ICD-10-CM | POA: Diagnosis not present

## 2022-05-04 DIAGNOSIS — I10 Essential (primary) hypertension: Secondary | ICD-10-CM | POA: Diagnosis not present

## 2022-05-04 LAB — CBC WITH DIFFERENTIAL (CANCER CENTER ONLY)
Abs Immature Granulocytes: 0.03 10*3/uL (ref 0.00–0.07)
Basophils Absolute: 0 10*3/uL (ref 0.0–0.1)
Basophils Relative: 0 %
Eosinophils Absolute: 0.1 10*3/uL (ref 0.0–0.5)
Eosinophils Relative: 1 %
HCT: 38.3 % (ref 36.0–46.0)
Hemoglobin: 12.9 g/dL (ref 12.0–15.0)
Immature Granulocytes: 0 %
Lymphocytes Relative: 9 %
Lymphs Abs: 0.9 10*3/uL (ref 0.7–4.0)
MCH: 30.4 pg (ref 26.0–34.0)
MCHC: 33.7 g/dL (ref 30.0–36.0)
MCV: 90.1 fL (ref 80.0–100.0)
Monocytes Absolute: 0.6 10*3/uL (ref 0.1–1.0)
Monocytes Relative: 6 %
Neutro Abs: 7.7 10*3/uL (ref 1.7–7.7)
Neutrophils Relative %: 84 %
Platelet Count: 359 10*3/uL (ref 150–400)
RBC: 4.25 MIL/uL (ref 3.87–5.11)
RDW: 13.2 % (ref 11.5–15.5)
WBC Count: 9.2 10*3/uL (ref 4.0–10.5)
nRBC: 0 % (ref 0.0–0.2)

## 2022-05-04 LAB — CMP (CANCER CENTER ONLY)
ALT: 12 U/L (ref 0–44)
AST: 12 U/L — ABNORMAL LOW (ref 15–41)
Albumin: 4 g/dL (ref 3.5–5.0)
Alkaline Phosphatase: 99 U/L (ref 38–126)
Anion gap: 8 (ref 5–15)
BUN: 27 mg/dL — ABNORMAL HIGH (ref 8–23)
CO2: 31 mmol/L (ref 22–32)
Calcium: 9.4 mg/dL (ref 8.9–10.3)
Chloride: 99 mmol/L (ref 98–111)
Creatinine: 1.08 mg/dL — ABNORMAL HIGH (ref 0.44–1.00)
GFR, Estimated: 51 mL/min — ABNORMAL LOW (ref >60)
Glucose, Bld: 174 mg/dL — ABNORMAL HIGH (ref 70–99)
Potassium: 3.3 mmol/L — ABNORMAL LOW (ref 3.5–5.1)
Sodium: 138 mmol/L (ref 135–145)
Total Bilirubin: 0.7 mg/dL (ref 0.3–1.2)
Total Protein: 7.5 g/dL (ref 6.5–8.1)

## 2022-05-04 MED ORDER — DOXYCYCLINE HYCLATE 100 MG PO TABS: 100.0000 mg | ORAL_TABLET | Freq: Two times a day (BID) | ORAL | 0 refills | Status: DC

## 2022-05-04 MED ORDER — PREDNISONE 10 MG PO TABS: ORAL_TABLET | ORAL | 0 refills | Status: DC

## 2022-05-04 MED ORDER — SODIUM CHLORIDE 0.9% FLUSH
10.0000 mL | Freq: Once | INTRAVENOUS | Status: AC
  Administered 2022-05-04: 10 mL

## 2022-05-04 MED ORDER — HEPARIN SOD (PORK) LOCK FLUSH 100 UNIT/ML IV SOLN
500.0000 [IU] | Freq: Once | INTRAVENOUS | Status: AC
  Administered 2022-05-04: 500 [IU]

## 2022-05-04 NOTE — Progress Notes (Signed)
[facility] Telephone:([phone]) [phone]   [phone]  OFFICE PROGRESS NOTE  Vanessa Raider, MD 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  PRIOR THERAPY:  1) Right video-assisted thoracoscopy, Wedge resection right lower lobe nodule, Thoracoscopic right lower lobectomy, Lymph node dissection on July 26, 2018 under the care of Dr. Dorris Fetch. 2) Concurrent chemoradiation with weekly carboplatin for AUC of 2 and paclitaxel 45 Mg/M2.  First dose November 22, 2021.  Status post 6 cycles.  CURRENT THERAPY: Consolidation treatment with immunotherapy with Imfinzi 1500 Mg IV every 4 weeks.  First dose February 09, 2022.  Status post 3 cycles.  INTERVAL HISTORY: Vanessa Day 82 y.o. female returns to the clinic today for follow-up visit accompanied by her husband.  The patient is feeling fine today with no concerning complaints except for the sciatica.  She was seen by neurosurgery as well as orthopedic surgery and she is currently undergoing physical therapy.  She denied having any current chest pain, shortness of breath, cough or hemoptysis.  She has no nausea, vomiting, diarrhea or constipation.  She has no headache or visual changes.  She denied having any significant weight loss or night sweats.  She has been tolerating her treatment with immunotherapy fairly well.  The patient is here today for evaluation with repeat CT scan of the chest before starting cycle #4 of her treatment.  MEDICAL HISTORY: Past Medical History:  Diagnosis Date   Adenocarcinoma of lung, stage 1, right (HCC) 2020   Anxiety    Arthritis    back, feet, jaw - per patient "all over"   Breast cancer (HCC) 2000   Right  breast   Family history of breast cancer    GERD (gastroesophageal reflux disease)    Graves disease    H/O hiatal hernia    Hyperlipidemia    Hypertension    IBS (irritable bowel syndrome)    Mitral regurgitation    moderate mitral regurgitation 08/20/20 echo   Neck pain    PAF (paroxysmal atrial fibrillation) (HCC)    ~ 09/2020 in setting of hyperthyroidism   PONV (postoperative nausea and vomiting)    Pre-diabetes     ALLERGIES:  is allergic to hydrocodone, amitriptyline hcl, angiotensin receptor blockers, codeine, dicyclomine hcl, hyoscyamine, nitrofurantoin, statins, tramadol, oxycodone-acetaminophen, pseudoephedrine, and sulfa antibiotics.  MEDICATIONS:  Current Outpatient Medications  Medication Sig Dispense Refill   acetaminophen (TYLENOL) 500 MG tablet Take 1 tablet (500 mg total) by mouth every 6 (six) hours as needed for mild pain. (Patient taking differently: Take 1,000 mg by mouth every 6 (six) hours as needed for mild pain.) 30 tablet 0   aspirin EC 81 MG tablet Take 1 tablet (81 mg total) by mouth daily. Swallow whole. 90 tablet 3   azelastine (ASTELIN) 0.1 % nasal spray Place 1 spray into both nostrils daily as needed (Congestion). Use in each nostril as directed     celecoxib (CELEBREX) 200 MG capsule Take 200 mg by mouth 2 (two) times daily.     cetirizine (ZYRTEC) 10 MG tablet Take 10 mg by mouth daily as needed for allergies.     diphenoxylate-atropine (LOMOTIL) 2.5-0.025 MG tablet Take 1 tablet by mouth. As directed  durvalumab (IMFINZI) 500 MG/10ML SOLN injection Inject 1,500 mg into the vein every 28 (twenty-eight) days.     ezetimibe (ZETIA) 10 MG tablet Take 10 mg by mouth daily.     hydrochlorothiazide (MICROZIDE) 12.5 MG capsule Take 1 capsule (12.5 mg total) by mouth daily. 45 capsule 3   lidocaine-prilocaine (EMLA) cream Apply 1 Application topically as needed. 30 g 2   LORazepam (ATIVAN) 1 MG tablet Take 1-2 mg by mouth at bedtime as needed for sleep.      methimazole (TAPAZOLE) 5 MG tablet Take 5 mg by mouth daily.     oxyCODONE (ROXICODONE) 5 MG immediate release tablet Take 1 tablet (5 mg total) by mouth every 6 (six) hours as needed for severe pain. 7 tablet 0   Polyethyl Glycol-Propyl Glycol (SYSTANE OP) Place 1 drop into both eyes 2 (two) times daily as needed (dry eyes).     prochlorperazine (COMPAZINE) 10 MG tablet Take 1 tablet (10 mg total) by mouth every 6 (six) hours as needed for nausea or vomiting. 30 tablet 0   senna-docusate (SENOKOT-S) 8.6-50 MG tablet Take 1 tablet by mouth daily. 30 tablet 0   No current facility-administered medications for this visit.   Facility-Administered Medications Ordered in Other Visits  Medication Dose Route Frequency Provider Last Rate Last Admin   sodium chloride flush (NS) 0.9 % injection 10 mL  10 mL Intravenous PRN Si GaulMohamed, Sabian Kuba, MD   10 mL at 01/25/22 1534    SURGICAL HISTORY:  Past Surgical History:  Procedure Laterality Date   ABDOMINAL HYSTERECTOMY     APPENDECTOMY     BACK SURGERY     BREAST BIOPSY     BREAST SURGERY     CARPOMETACARPEL SUSPENSION PLASTY Left 08/25/2015   Procedure: SUSPENSIONPLASTY LEFT THUMB TRAPEZIUM EXCISION ABDUCTOR POLLICIS LONGUS TRANSFER;  Surgeon: Cindee SaltGary Kuzma, MD;  Location: [facility];  Service: Orthopedics;  Laterality: Left;   CHOLECYSTECTOMY     COLONOSCOPY     several   EYE SURGERY     cataract right and left eye   GAS INSERTION  06/23/2011   Procedure: INSERTION OF GAS;  Surgeon: Sherrie GeorgeJohn D Matthews, MD;  [facility];  Service: Ophthalmology;  Laterality: Left;  SF6   IR IMAGING GUIDED PORT INSERTION  12/14/2021   LOBECTOMY Right 07/26/2018   Procedure: RIGHT LOWER LOBE LOBECTOMY;  Surgeon: Loreli SlotHendrickson, Steven C, MD;  [facility];  Service: Thoracic;  Laterality: Right;   MASTECTOMY     right breast   SCLERAL BUCKLE  06/23/2011   Procedure: SCLERAL BUCKLE;  Surgeon: Sherrie GeorgeJohn D Matthews, MD;  [facility];  Service:  Ophthalmology;  Laterality: Left;   SEGMENTECOMY Right 07/26/2018   Procedure: SEGMENTECTOMY;  Surgeon: Loreli SlotHendrickson, Steven C, MD;  [facility];  Service: Thoracic;  Laterality: Right;   TONSILLECTOMY     VIDEO ASSISTED THORACOSCOPY Right 07/26/2018   Procedure: VIDEO ASSISTED THORACOSCOPY;  Surgeon: Loreli SlotHendrickson, Steven C, MD;  [facility];  Service: Thoracic;  Laterality: Right;   VIDEO BRONCHOSCOPY WITH ENDOBRONCHIAL ULTRASOUND N/A 10/27/2021   Procedure: VIDEO BRONCHOSCOPY WITH ENDOBRONCHIAL ULTRASOUND;  Surgeon: Loreli SlotHendrickson, Steven C, MD;  [facility]: [facility];  Service: Thoracic;  Laterality: N/A;    REVIEW OF SYSTEMS:  Constitutional: negative Eyes: negative Ears, nose, mouth, throat, and face: negative Respiratory: negative Cardiovascular: negative Gastrointestinal: negative Genitourinary:negative Integument/breast: negative Hematologic/lymphatic: negative Musculoskeletal:positive for arthralgias and back pain Neurological: negative Behavioral/Psych: negative Endocrine: negative Allergic/Immunologic: negative   PHYSICAL EXAMINATION: General appearance:  alert, cooperative, and no distress Head: Normocephalic, without obvious abnormality, atraumatic Neck: no adenopathy, no JVD, supple, symmetrical, trachea midline, and thyroid not enlarged, symmetric, no tenderness/mass/nodules Lymph nodes: Cervical, supraclavicular, and axillary nodes normal. Resp: clear to auscultation bilaterally Back: symmetric, no curvature. ROM normal. No CVA tenderness. Cardio: regular rate and rhythm, S1, S2 normal, no murmur, click, rub or gallop GI: soft, non-tender; bowel sounds normal; no masses,  no organomegaly Extremities: extremities normal, atraumatic, no cyanosis or edema Neurologic: Alert and oriented X 3, normal strength and tone. Normal symmetric reflexes. Normal coordination and gait  ECOG PERFORMANCE STATUS: 1 - Symptomatic but completely ambulatory   Blood pressure 128/68, pulse  98, temperature 98 F (36.7 C), resp. rate 16, weight 134 lb 3.2 oz (60.9 kg), SpO2 97 %.  LABORATORY DATA: Lab Results  Component Value Date   WBC 8.4 04/06/2022   HGB 13.2 04/06/2022   HCT 39.1 04/06/2022   MCV 93.8 04/06/2022   PLT 260 04/06/2022      Chemistry      Component Value Date/Time   NA 140 04/06/2022 0838   K 3.3 (L) 04/06/2022 0838   CL 102 04/06/2022 0838   CO2 27 04/06/2022 0838   BUN 30 (H) 04/06/2022 0838   CREATININE 1.17 (H) 04/06/2022 0838      Component Value Date/Time   CALCIUM 9.3 04/06/2022 0838   ALKPHOS 71 04/06/2022 0838   AST 17 04/06/2022 0838   ALT 15 04/06/2022 0838   BILITOT 1.1 04/06/2022 0838       RADIOGRAPHIC STUDIES: CT Chest W Contrast  Result Date: 05/04/2022 CLINICAL DATA:  Non-small-cell lung cancer of the left lower lobe. Restaging. * Tracking Code: BO * EXAM: CT CHEST WITH CONTRAST TECHNIQUE: Multidetector CT imaging of the chest was performed during intravenous contrast administration. RADIATION DOSE REDUCTION: This exam was performed according to the departmental dose-optimization program which includes automated exposure control, adjustment of the mA and/or kV according to patient size and/or use of iterative reconstruction technique. CONTRAST:  75mL OMNIPAQUE IOHEXOL 300 MG/ML  SOLN COMPARISON:  01/31/2022 FINDINGS: Cardiovascular: The heart size is normal. No substantial pericardial effusion. Coronary artery calcification is evident. Moderate atherosclerotic calcification is noted in the wall of the thoracic aorta. Right Port-A-Cath tip is positioned in the right atrium. Laminar flow of un opacified blood from the right internal jugular vein and azygous vein noted in the SVC. Mediastinum/Nodes: No mediastinal lymphadenopathy. There is no hilar lymphadenopathy. The esophagus has normal imaging features. There is no axillary lymphadenopathy. Lungs/Pleura: Volume loss in the right hemithorax is compatible with right lower lobectomy.  There is new interlobular septal thickening with a moderate sized patchy ground-glass opacity in the posterior right upper lung (see image 42/7). Along the medial aspect of this finding is a 1.8 x 2.0 cm irregular confluent nodular opacity. Just inferior to this moderate area of ground-glass opacity is another 3.0 x 2.8 cm nodular consolidative lesion (59/7). A third new nodular consolidative focus is seen in the anterior right lung on image 66/7 measuring 2.1 x 1.1 cm. Fissural thickening noted in the area of fissural nodularity noted in the right lung previously. Tiny right pleural effusion is new in the interval with loculated anterior and paraspinal components. Tiny peripheral left lower lobe nodule on 105/7 is unchanged. No suspicious pulmonary nodule or mass in the left lung. Upper Abdomen: Visualized portion of the upper abdomen is unremarkable. Musculoskeletal: No worrisome lytic or sclerotic osseous abnormality. IMPRESSION: 1. Interval development of interlobular  septal thickening with a moderate sized patchy ground-glass opacity in the posterior right upper lung. There are 3 new irregular confluent nodular consolidative opacities in the right upper lung measuring up to 3.0 cm. Appearance is somewhat atypical for evolving post radiation change and infectious/inflammatory etiology should be considered given the relatively rapid interval appearance. Nevertheless, close follow-up warranted to exclude progression 2. Tiny right pleural effusion with loculated anterior and paraspinal components, new in the interval. 3. No evidence for metastatic disease in the left lung. 4.  Aortic Atherosclerosis (ICD10-I70.0). Electronically Signed   By: Kennith Center M.D.   On: 05/04/2022 10:54     ASSESSMENT AND PLAN: This is a very pleasant 82 years old white female with stage IIIA (T1c, N2, M0) non-small cell lung cancer, adenocarcinoma initially diagnosed as diagnosed with a stage IA (T1c, N0, M0) non-small cell lung  cancer, adenocarcinoma status post right lower lobectomy with lymph node dissection in July 2020.  She had disease recurrence in October 2023. The patient is currently on observation and she is feeling fine except for the dry cough. Her CT scan of the chest followed by PET scan showed increasing nodularity of the pleural surface in the right chest with ill-defined areas in the cardiophrenic recess and increasing size of lymph nodes in the mediastinum. The patient underwent bronchoscopy with EBUS under the care of Dr. 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 The patient started a course of concurrent chemoradiation with weekly carboplatin for AUC of 2 and paclitaxel 45 Mg/M2 status post 7 cycles.  She tolerated this treatment well except for the radiation-induced esophagitis as well as fatigue. The patient had repeat CT scan of the chest performed recently.  Her scan showed improvement of her disease especially in the mediastinal lymph nodes. The patient is currently undergoing consolidation treatment with immunotherapy with Imfinzi 1500 Mg IV every 4 weeks status post 3 cycles .  The patient has been tolerating this treatment well with no concerning adverse effects. She had repeat CT scan of the chest performed yesterday.  I personally and independently reviewed the scan images and discussed the result and showed the images to the patient. The final report of the scan is still pending but I noticed formation of groundglass opacity and consolidation in the right lung highly suspicious for immunotherapy mediated pneumonitis/pneumonia versus disease progression. I recommended for the patient to hold her treatment with Imfinzi for now. I will start her on a tapered dose of prednisone for the next 5 weeks.  She was because of the adverse effect of prednisone including hyperglycemia, gastritis, insomnia and other adverse effects. I will  also give the patient prescription for doxycycline 100 mg p.o. twice daily for 2 weeks. I will see her back for follow-up visit in around 5 weeks with repeat CT scan of the chest for reevaluation of her disease after the steroid treatment. The patient was advised to call immediately if she has any other concerning symptoms in the interval. The patient voices understanding of current disease status and treatment options and is in agreement with the current care plan. All questions were answered. The patient knows to call the clinic with any problems, questions or concerns. We can certainly see the patient much sooner if necessary. The total time spent in the appointment was 35 minutes.  Disclaimer: This note was dictated with voice recognition software. Similar sounding words can inadvertently be transcribed and may not be corrected upon review.

## 2022-05-09 DIAGNOSIS — R531 Weakness: Secondary | ICD-10-CM | POA: Diagnosis not present

## 2022-05-09 DIAGNOSIS — M545 Low back pain, unspecified: Secondary | ICD-10-CM | POA: Diagnosis not present

## 2022-05-09 DIAGNOSIS — M79605 Pain in left leg: Secondary | ICD-10-CM | POA: Diagnosis not present

## 2022-05-09 DIAGNOSIS — M4800 Spinal stenosis, site unspecified: Secondary | ICD-10-CM | POA: Diagnosis not present

## 2022-05-16 DIAGNOSIS — G620 Drug-induced polyneuropathy: Secondary | ICD-10-CM | POA: Diagnosis not present

## 2022-05-16 DIAGNOSIS — N1831 Chronic kidney disease, stage 3a: Secondary | ICD-10-CM | POA: Diagnosis not present

## 2022-05-16 DIAGNOSIS — I700 Atherosclerosis of aorta: Secondary | ICD-10-CM | POA: Diagnosis not present

## 2022-05-16 DIAGNOSIS — D84821 Immunodeficiency due to drugs: Secondary | ICD-10-CM | POA: Diagnosis not present

## 2022-05-16 DIAGNOSIS — F3341 Major depressive disorder, recurrent, in partial remission: Secondary | ICD-10-CM | POA: Diagnosis not present

## 2022-05-16 DIAGNOSIS — E538 Deficiency of other specified B group vitamins: Secondary | ICD-10-CM | POA: Diagnosis not present

## 2022-05-16 DIAGNOSIS — I10 Essential (primary) hypertension: Secondary | ICD-10-CM | POA: Diagnosis not present

## 2022-05-16 DIAGNOSIS — Z0000 Encounter for general adult medical examination without abnormal findings: Secondary | ICD-10-CM | POA: Diagnosis not present

## 2022-05-16 DIAGNOSIS — E0500 Thyrotoxicosis with diffuse goiter without thyrotoxic crisis or storm: Secondary | ICD-10-CM | POA: Diagnosis not present

## 2022-05-16 DIAGNOSIS — C3491 Malignant neoplasm of unspecified part of right bronchus or lung: Secondary | ICD-10-CM | POA: Diagnosis not present

## 2022-05-16 DIAGNOSIS — E1122 Type 2 diabetes mellitus with diabetic chronic kidney disease: Secondary | ICD-10-CM | POA: Diagnosis not present

## 2022-05-16 DIAGNOSIS — E782 Mixed hyperlipidemia: Secondary | ICD-10-CM | POA: Diagnosis not present

## 2022-05-16 DIAGNOSIS — I480 Paroxysmal atrial fibrillation: Secondary | ICD-10-CM | POA: Diagnosis not present

## 2022-05-18 ENCOUNTER — Encounter: Payer: Self-pay | Admitting: Internal Medicine

## 2022-05-19 ENCOUNTER — Telehealth: Payer: Self-pay | Admitting: Medical Oncology

## 2022-05-19 NOTE — Telephone Encounter (Signed)
CT labs-Dr Clelia Croft drew labs 04/22. Pt will fax the lab results that were done. If she drew a creatinine and it was normal ,then Annalena does not need a  "port flush with labs"  before her CT scan. She will only need "port flush' to access her port for radiology.

## 2022-05-20 DIAGNOSIS — M545 Low back pain, unspecified: Secondary | ICD-10-CM | POA: Diagnosis not present

## 2022-05-20 DIAGNOSIS — M79605 Pain in left leg: Secondary | ICD-10-CM | POA: Diagnosis not present

## 2022-05-20 DIAGNOSIS — R531 Weakness: Secondary | ICD-10-CM | POA: Diagnosis not present

## 2022-05-20 DIAGNOSIS — M4800 Spinal stenosis, site unspecified: Secondary | ICD-10-CM | POA: Diagnosis not present

## 2022-05-21 ENCOUNTER — Other Ambulatory Visit: Payer: Self-pay

## 2022-05-23 DIAGNOSIS — E0590 Thyrotoxicosis, unspecified without thyrotoxic crisis or storm: Secondary | ICD-10-CM | POA: Diagnosis not present

## 2022-05-25 ENCOUNTER — Telehealth: Payer: Self-pay | Admitting: Internal Medicine

## 2022-05-25 NOTE — Telephone Encounter (Signed)
Called patient regarding upcoming May/June appointments, left a voicemail. 

## 2022-05-31 ENCOUNTER — Other Ambulatory Visit: Payer: Self-pay | Admitting: Internal Medicine

## 2022-05-31 ENCOUNTER — Telehealth: Payer: Self-pay | Admitting: Internal Medicine

## 2022-05-31 NOTE — Telephone Encounter (Signed)
Rescheduled 05/14 appointment due to flush meeting, patient has been called and notified.

## 2022-06-01 ENCOUNTER — Inpatient Hospital Stay: Payer: Medicare PPO

## 2022-06-01 ENCOUNTER — Inpatient Hospital Stay: Payer: Medicare PPO | Admitting: Internal Medicine

## 2022-06-01 ENCOUNTER — Inpatient Hospital Stay: Payer: Medicare PPO | Admitting: Dietician

## 2022-06-02 ENCOUNTER — Encounter: Payer: Self-pay | Admitting: Internal Medicine

## 2022-06-03 ENCOUNTER — Telehealth: Payer: Self-pay | Admitting: Medical Oncology

## 2022-06-03 ENCOUNTER — Other Ambulatory Visit: Payer: Self-pay | Admitting: Medical Oncology

## 2022-06-03 NOTE — Telephone Encounter (Signed)
Dry Cough x 2 weeks started after she completed prednisone and antibiotic for  CT that showed cloudiness". Today she left a message for her PCP with these symptoms.   I instructed her to try Delsym and if symptoms worsen to go to ED.

## 2022-06-03 NOTE — Progress Notes (Signed)
cough

## 2022-06-06 ENCOUNTER — Inpatient Hospital Stay: Payer: Medicare PPO | Attending: Internal Medicine

## 2022-06-06 ENCOUNTER — Other Ambulatory Visit: Payer: Self-pay

## 2022-06-06 DIAGNOSIS — Z923 Personal history of irradiation: Secondary | ICD-10-CM | POA: Insufficient documentation

## 2022-06-06 DIAGNOSIS — C3431 Malignant neoplasm of lower lobe, right bronchus or lung: Secondary | ICD-10-CM | POA: Diagnosis not present

## 2022-06-06 DIAGNOSIS — Z9011 Acquired absence of right breast and nipple: Secondary | ICD-10-CM | POA: Diagnosis not present

## 2022-06-06 DIAGNOSIS — Z853 Personal history of malignant neoplasm of breast: Secondary | ICD-10-CM | POA: Insufficient documentation

## 2022-06-06 DIAGNOSIS — Z79899 Other long term (current) drug therapy: Secondary | ICD-10-CM | POA: Diagnosis not present

## 2022-06-06 DIAGNOSIS — Z7982 Long term (current) use of aspirin: Secondary | ICD-10-CM | POA: Insufficient documentation

## 2022-06-06 DIAGNOSIS — Z9221 Personal history of antineoplastic chemotherapy: Secondary | ICD-10-CM | POA: Insufficient documentation

## 2022-06-06 DIAGNOSIS — Z902 Acquired absence of lung [part of]: Secondary | ICD-10-CM | POA: Insufficient documentation

## 2022-06-06 DIAGNOSIS — Z95828 Presence of other vascular implants and grafts: Secondary | ICD-10-CM

## 2022-06-06 DIAGNOSIS — Z7952 Long term (current) use of systemic steroids: Secondary | ICD-10-CM | POA: Insufficient documentation

## 2022-06-06 DIAGNOSIS — C3491 Malignant neoplasm of unspecified part of right bronchus or lung: Secondary | ICD-10-CM

## 2022-06-06 LAB — CMP (CANCER CENTER ONLY)
ALT: 13 U/L (ref 0–44)
AST: 13 U/L — ABNORMAL LOW (ref 15–41)
Albumin: 3.8 g/dL (ref 3.5–5.0)
Alkaline Phosphatase: 65 U/L (ref 38–126)
Anion gap: 11 (ref 5–15)
BUN: 23 mg/dL (ref 8–23)
CO2: 29 mmol/L (ref 22–32)
Calcium: 8.6 mg/dL — ABNORMAL LOW (ref 8.9–10.3)
Chloride: 98 mmol/L (ref 98–111)
Creatinine: 1.04 mg/dL — ABNORMAL HIGH (ref 0.44–1.00)
GFR, Estimated: 54 mL/min — ABNORMAL LOW (ref >60)
Glucose, Bld: 120 mg/dL — ABNORMAL HIGH (ref 70–99)
Potassium: 3 mmol/L — ABNORMAL LOW (ref 3.5–5.1)
Sodium: 138 mmol/L (ref 135–145)
Total Bilirubin: 1.3 mg/dL — ABNORMAL HIGH (ref 0.3–1.2)
Total Protein: 6.7 g/dL (ref 6.5–8.1)

## 2022-06-06 LAB — CBC WITH DIFFERENTIAL (CANCER CENTER ONLY)
Abs Immature Granulocytes: 0.04 10*3/uL (ref 0.00–0.07)
Basophils Absolute: 0 10*3/uL (ref 0.0–0.1)
Basophils Relative: 0 %
Eosinophils Absolute: 0.1 10*3/uL (ref 0.0–0.5)
Eosinophils Relative: 1 %
HCT: 38.4 % (ref 36.0–46.0)
Hemoglobin: 12.6 g/dL (ref 12.0–15.0)
Immature Granulocytes: 1 %
Lymphocytes Relative: 10 %
Lymphs Abs: 0.7 10*3/uL (ref 0.7–4.0)
MCH: 29.9 pg (ref 26.0–34.0)
MCHC: 32.8 g/dL (ref 30.0–36.0)
MCV: 91 fL (ref 80.0–100.0)
Monocytes Absolute: 0.6 10*3/uL (ref 0.1–1.0)
Monocytes Relative: 9 %
Neutro Abs: 5.7 10*3/uL (ref 1.7–7.7)
Neutrophils Relative %: 79 %
Platelet Count: 213 10*3/uL (ref 150–400)
RBC: 4.22 MIL/uL (ref 3.87–5.11)
RDW: 14.5 % (ref 11.5–15.5)
WBC Count: 7.2 10*3/uL (ref 4.0–10.5)
nRBC: 0 % (ref 0.0–0.2)

## 2022-06-06 MED ORDER — SODIUM CHLORIDE 0.9% FLUSH
10.0000 mL | Freq: Once | INTRAVENOUS | Status: AC
  Administered 2022-06-06: 10 mL

## 2022-06-06 MED ORDER — HEPARIN SOD (PORK) LOCK FLUSH 100 UNIT/ML IV SOLN
500.0000 [IU] | Freq: Once | INTRAVENOUS | Status: AC
  Administered 2022-06-06: 500 [IU]

## 2022-06-06 NOTE — Progress Notes (Signed)
Patient scheduled for CT chest tomorrow; flushed, blood return, take home dressing with antimicrobial disc in place, left accessed.

## 2022-06-07 ENCOUNTER — Encounter (HOSPITAL_COMMUNITY): Payer: Self-pay

## 2022-06-07 ENCOUNTER — Other Ambulatory Visit: Payer: Medicare PPO

## 2022-06-07 ENCOUNTER — Inpatient Hospital Stay: Payer: Medicare PPO

## 2022-06-07 ENCOUNTER — Ambulatory Visit (HOSPITAL_COMMUNITY)
Admission: RE | Admit: 2022-06-07 | Discharge: 2022-06-07 | Disposition: A | Discharge status: Home / Self Care | Payer: Medicare PPO | Source: Ambulatory Visit | Attending: Internal Medicine | Admitting: Internal Medicine

## 2022-06-07 DIAGNOSIS — J90 Pleural effusion, not elsewhere classified: Secondary | ICD-10-CM | POA: Diagnosis not present

## 2022-06-07 DIAGNOSIS — C3490 Malignant neoplasm of unspecified part of unspecified bronchus or lung: Secondary | ICD-10-CM | POA: Insufficient documentation

## 2022-06-07 DIAGNOSIS — J181 Lobar pneumonia, unspecified organism: Secondary | ICD-10-CM | POA: Diagnosis not present

## 2022-06-07 DIAGNOSIS — J984 Other disorders of lung: Secondary | ICD-10-CM | POA: Diagnosis not present

## 2022-06-07 MED ORDER — IOHEXOL 300 MG/ML  SOLN
75.0000 mL | Freq: Once | INTRAMUSCULAR | Status: AC | PRN
  Administered 2022-06-07: 75 mL via INTRAVENOUS

## 2022-06-07 MED ORDER — HEPARIN SOD (PORK) LOCK FLUSH 100 UNIT/ML IV SOLN
INTRAVENOUS | Status: AC
  Filled 2022-06-07: qty 5

## 2022-06-07 MED ORDER — HEPARIN SOD (PORK) LOCK FLUSH 100 UNIT/ML IV SOLN
500.0000 [IU] | Freq: Once | INTRAVENOUS | Status: AC
  Administered 2022-06-07: 500 [IU] via INTRAVENOUS

## 2022-06-07 MED ORDER — SODIUM CHLORIDE (PF) 0.9 % IJ SOLN
INTRAMUSCULAR | Status: AC
  Filled 2022-06-07: qty 50

## 2022-06-09 ENCOUNTER — Telehealth: Payer: Self-pay

## 2022-06-09 DIAGNOSIS — J189 Pneumonia, unspecified organism: Secondary | ICD-10-CM | POA: Diagnosis not present

## 2022-06-09 DIAGNOSIS — R059 Cough, unspecified: Secondary | ICD-10-CM | POA: Diagnosis not present

## 2022-06-09 DIAGNOSIS — C3491 Malignant neoplasm of unspecified part of right bronchus or lung: Secondary | ICD-10-CM | POA: Diagnosis not present

## 2022-06-09 NOTE — Telephone Encounter (Signed)
-----   Message from Si Gaul, MD sent at 06/06/2022  6:24 PM EDT ----- Please call her and let her to increase her potassium rich diet.  Thank you ----- Message ----- From: Leory Plowman, Lab In [facility] Sent: 06/06/2022   2:40 PM EDT To: Si Gaul, MD

## 2022-06-09 NOTE — Telephone Encounter (Signed)
This nurse made patient aware that her potassium is slightly that low.  Advised that provider would like her to increase potassium in her diet.  Patient acknowledged understanding.  She also wants to inform provider that she has been really sick and she has been started on Augmentin for 10 days and her CT scan may show some abnormal results.  This nurse acknowledged and advised that provider will be made aware.  No further questions or concerns noted.

## 2022-06-11 ENCOUNTER — Other Ambulatory Visit: Payer: Self-pay

## 2022-06-14 ENCOUNTER — Other Ambulatory Visit: Payer: Self-pay

## 2022-06-15 ENCOUNTER — Telehealth: Payer: Self-pay | Admitting: Medical Oncology

## 2022-06-15 ENCOUNTER — Inpatient Hospital Stay: Payer: Medicare PPO

## 2022-06-15 ENCOUNTER — Other Ambulatory Visit: Payer: Medicare PPO

## 2022-06-15 ENCOUNTER — Other Ambulatory Visit: Payer: Self-pay

## 2022-06-15 ENCOUNTER — Inpatient Hospital Stay (HOSPITAL_BASED_OUTPATIENT_CLINIC_OR_DEPARTMENT_OTHER): Payer: Medicare PPO | Admitting: Internal Medicine

## 2022-06-15 VITALS — BP 107/61 | HR 116 | Temp 98.0°F | Resp 18 | Wt 128.6 lb

## 2022-06-15 DIAGNOSIS — C3490 Malignant neoplasm of unspecified part of unspecified bronchus or lung: Secondary | ICD-10-CM

## 2022-06-15 DIAGNOSIS — Z95828 Presence of other vascular implants and grafts: Secondary | ICD-10-CM

## 2022-06-15 DIAGNOSIS — C3491 Malignant neoplasm of unspecified part of right bronchus or lung: Secondary | ICD-10-CM

## 2022-06-15 DIAGNOSIS — Z923 Personal history of irradiation: Secondary | ICD-10-CM | POA: Diagnosis not present

## 2022-06-15 DIAGNOSIS — C3431 Malignant neoplasm of lower lobe, right bronchus or lung: Secondary | ICD-10-CM

## 2022-06-15 DIAGNOSIS — Z9011 Acquired absence of right breast and nipple: Secondary | ICD-10-CM | POA: Diagnosis not present

## 2022-06-15 DIAGNOSIS — Z7952 Long term (current) use of systemic steroids: Secondary | ICD-10-CM | POA: Diagnosis not present

## 2022-06-15 DIAGNOSIS — Z853 Personal history of malignant neoplasm of breast: Secondary | ICD-10-CM | POA: Diagnosis not present

## 2022-06-15 DIAGNOSIS — Z9221 Personal history of antineoplastic chemotherapy: Secondary | ICD-10-CM | POA: Diagnosis not present

## 2022-06-15 DIAGNOSIS — Z79899 Other long term (current) drug therapy: Secondary | ICD-10-CM | POA: Diagnosis not present

## 2022-06-15 DIAGNOSIS — Z7982 Long term (current) use of aspirin: Secondary | ICD-10-CM | POA: Diagnosis not present

## 2022-06-15 DIAGNOSIS — Z902 Acquired absence of lung [part of]: Secondary | ICD-10-CM | POA: Diagnosis not present

## 2022-06-15 LAB — CMP (CANCER CENTER ONLY)
ALT: 30 U/L (ref 0–44)
AST: 21 U/L (ref 15–41)
Albumin: 3.3 g/dL — ABNORMAL LOW (ref 3.5–5.0)
Alkaline Phosphatase: 99 U/L (ref 38–126)
Anion gap: 8 (ref 5–15)
BUN: 18 mg/dL (ref 8–23)
CO2: 29 mmol/L (ref 22–32)
Calcium: 8.7 mg/dL — ABNORMAL LOW (ref 8.9–10.3)
Chloride: 99 mmol/L (ref 98–111)
Creatinine: 1.11 mg/dL — ABNORMAL HIGH (ref 0.44–1.00)
GFR, Estimated: 50 mL/min — ABNORMAL LOW (ref >60)
Glucose, Bld: 165 mg/dL — ABNORMAL HIGH (ref 70–99)
Potassium: 3.4 mmol/L — ABNORMAL LOW (ref 3.5–5.1)
Sodium: 136 mmol/L (ref 135–145)
Total Bilirubin: 0.8 mg/dL (ref 0.3–1.2)
Total Protein: 7.2 g/dL (ref 6.5–8.1)

## 2022-06-15 LAB — CBC WITH DIFFERENTIAL (CANCER CENTER ONLY)
Abs Immature Granulocytes: 0.07 10*3/uL (ref 0.00–0.07)
Basophils Absolute: 0 10*3/uL (ref 0.0–0.1)
Basophils Relative: 0 %
Eosinophils Absolute: 0.1 10*3/uL (ref 0.0–0.5)
Eosinophils Relative: 1 %
HCT: 31.7 % — ABNORMAL LOW (ref 36.0–46.0)
Hemoglobin: 10.8 g/dL — ABNORMAL LOW (ref 12.0–15.0)
Immature Granulocytes: 1 %
Lymphocytes Relative: 7 %
Lymphs Abs: 0.7 10*3/uL (ref 0.7–4.0)
MCH: 30.4 pg (ref 26.0–34.0)
MCHC: 34.1 g/dL (ref 30.0–36.0)
MCV: 89.3 fL (ref 80.0–100.0)
Monocytes Absolute: 0.8 10*3/uL (ref 0.1–1.0)
Monocytes Relative: 9 %
Neutro Abs: 7.6 10*3/uL (ref 1.7–7.7)
Neutrophils Relative %: 82 %
Platelet Count: 444 10*3/uL — ABNORMAL HIGH (ref 150–400)
RBC: 3.55 MIL/uL — ABNORMAL LOW (ref 3.87–5.11)
RDW: 14.3 % (ref 11.5–15.5)
WBC Count: 9.2 10*3/uL (ref 4.0–10.5)
nRBC: 0 % (ref 0.0–0.2)

## 2022-06-15 MED ORDER — METHYLPREDNISOLONE 4 MG PO TBPK: ORAL_TABLET | ORAL | 0 refills | Status: DC

## 2022-06-15 MED ORDER — HEPARIN SOD (PORK) LOCK FLUSH 100 UNIT/ML IV SOLN: 500.0000 [IU] | Freq: Once | INTRAVENOUS | Status: DC

## 2022-06-15 MED ORDER — SODIUM CHLORIDE 0.9% FLUSH
10.0000 mL | Freq: Once | INTRAVENOUS | Status: AC
  Administered 2022-06-15: 10 mL

## 2022-06-15 MED ORDER — SODIUM CHLORIDE 0.9% FLUSH: 10.0000 mL | INTRAVENOUS | Status: DC | PRN

## 2022-06-15 NOTE — Telephone Encounter (Signed)
Pulmonary appt  June 11th with  Dr. Melody Comas, MD.

## 2022-06-15 NOTE — Progress Notes (Signed)
[facility] [phone]   [phone]  OFFICE PROGRESS NOTE  Lupita Raider, MD [address] [place] [address]  DIAGNOSIS: Recurrent non-small cell lung cancer presented as a stage IIIa (T1c, N2, M0) non-small cell lung cancer, adenocarcinoma initially diagnosed as T1a (T1c, N0, M0) non-small cell lung cancer, adenocarcinoma presented with right lower lobe lung nodule in June 2020.  PRIOR THERAPY:  1) Right video-assisted thoracoscopy, Wedge resection right lower lobe nodule, Thoracoscopic right lower lobectomy, Lymph node dissection on July 26, 2018 under the care of Dr. Dorris Fetch. 2) Concurrent chemoradiation with weekly carboplatin for AUC of 2 and paclitaxel 45 Mg/M2.  First dose November 22, 2021.  Status post 6 cycles. 3) Consolidation treatment with immunotherapy with Imfinzi 1500 Mg IV every 4 weeks.  First dose February 09, 2022.  Status post 3 cycles.  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  CURRENT THERAPY: Observation.   INTERVAL HISTORY: Vanessa Day 82 y.o. female returns to the clinic today for follow-up visit accompanied by her husband.  The patient is feeling miserable today with increasing fatigue and weakness as well as shortness of breath with minimal exertion.  She was treated with a tapered dose of prednisone as well as doxycycline with no significant improvement in her condition.  She denied having any chest pain but has cough productive of whitish sputum with no hemoptysis.  She has no nausea, vomiting, diarrhea or constipation.  She has lack of appetite and she lost few pounds.  She is tearful at times because of her condition.  She had repeat CT scan of the chest performed recently and she is here for evaluation and discussion of her scan results and recommendation regarding her condition.   MEDICAL HISTORY: Past Medical History:  Diagnosis Date    Adenocarcinoma of lung, stage 1, right (HCC) 2020   Anxiety    Arthritis    back, feet, jaw - per patient "all over"   Breast cancer (HCC) 2000   Right breast   Family history of breast cancer    GERD (gastroesophageal reflux disease)    Graves disease    H/O hiatal hernia    Hyperlipidemia    Hypertension    IBS (irritable bowel syndrome)    Mitral regurgitation    moderate mitral regurgitation 08/20/20 echo   Neck pain    PAF (paroxysmal atrial fibrillation) (HCC)    ~ 09/2020 in setting of hyperthyroidism   PONV (postoperative nausea and vomiting)    Pre-diabetes     ALLERGIES:  is allergic to hydrocodone, amitriptyline hcl, angiotensin receptor blockers, codeine, dicyclomine hcl, hyoscyamine, nitrofurantoin, statins, tramadol, oxycodone-acetaminophen, pseudoephedrine, and sulfa antibiotics.  MEDICATIONS:  Current Outpatient Medications  Medication Sig Dispense Refill   acetaminophen (TYLENOL) 500 MG tablet Take 1 tablet (500 mg total) by mouth every 6 (six) hours as needed for mild pain. (Patient taking differently: Take 1,000 mg by mouth every 6 (six) hours as needed for mild pain.) 30 tablet 0   aspirin EC 81 MG tablet Take 1 tablet (81 mg total) by mouth daily. Swallow whole. 90 tablet 3   azelastine (ASTELIN) 0.1 % nasal spray Place 1 spray into both nostrils daily as needed (Congestion). Use in each nostril as directed     celecoxib (CELEBREX) 200 MG capsule Take 200 mg by mouth 2 (two) times daily.     cetirizine (ZYRTEC) 10 MG tablet Take 10 mg by mouth daily  as needed for allergies.     diphenoxylate-atropine (LOMOTIL) 2.5-0.025 MG tablet Take 1 tablet by mouth. As directed     durvalumab (IMFINZI) 500 MG/10ML SOLN injection Inject 1,500 mg into the vein every 28 (twenty-eight) days.     ezetimibe (ZETIA) 10 MG tablet Take 10 mg by mouth daily.     hydrochlorothiazide (MICROZIDE) 12.5 MG capsule Take 1 capsule (12.5 mg total) by mouth daily. 45 capsule 3    lidocaine-prilocaine (EMLA) cream Apply 1 Application topically as needed. 30 g 2   LORazepam (ATIVAN) 1 MG tablet Take 1-2 mg by mouth at bedtime as needed for sleep.     losartan (COZAAR) 25 MG tablet Take 25 mg by mouth daily.     methimazole (TAPAZOLE) 5 MG tablet Take 5 mg by mouth daily.     oxyCODONE (ROXICODONE) 5 MG immediate release tablet Take 1 tablet (5 mg total) by mouth every 6 (six) hours as needed for severe pain. 7 tablet 0   Polyethyl Glycol-Propyl Glycol (SYSTANE OP) Place 1 drop into both eyes 2 (two) times daily as needed (dry eyes).     senna-docusate (SENOKOT-S) 8.6-50 MG tablet Take 1 tablet by mouth daily. 30 tablet 0   No current facility-administered medications for this visit.   Facility-Administered Medications Ordered in Other Visits  Medication Dose Route Frequency Provider Last Rate Last Admin   sodium chloride flush (NS) 0.9 % injection 10 mL  10 mL Intravenous PRN Si Gaul, MD   10 mL at 01/25/22 1534    SURGICAL HISTORY:  Past Surgical History:  Procedure Laterality Date   ABDOMINAL HYSTERECTOMY     APPENDECTOMY     BACK SURGERY     BREAST BIOPSY     BREAST SURGERY     CARPOMETACARPEL SUSPENSION PLASTY Left 08/25/2015   Procedure: SUSPENSIONPLASTY LEFT THUMB TRAPEZIUM EXCISION ABDUCTOR POLLICIS LONGUS TRANSFER;  Surgeon: Cindee Salt, MD;  Location: [facility];  Service: Orthopedics;  Laterality: Left;   CHOLECYSTECTOMY     COLONOSCOPY     several   EYE SURGERY     cataract right and left eye   GAS INSERTION  06/23/2011   Procedure: INSERTION OF GAS;  Surgeon: Sherrie George, MD;  Location: [facility] OR;  Service: Ophthalmology;  Laterality: Left;  SF6   IR IMAGING GUIDED PORT INSERTION  12/14/2021   LOBECTOMY Right 07/26/2018   Procedure: RIGHT LOWER LOBE LOBECTOMY;  Surgeon: Loreli Slot, MD;  Location: [facility] OR;  Service: Thoracic;  Laterality: Right;   MASTECTOMY     right breast   SCLERAL BUCKLE  06/23/2011    Procedure: SCLERAL BUCKLE;  Surgeon: Sherrie George, MD;  Location: [facility] OR;  Service: Ophthalmology;  Laterality: Left;   SEGMENTECOMY Right 07/26/2018   Procedure: SEGMENTECTOMY;  Surgeon: Loreli Slot, MD;  Location: [facility] OR;  Service: Thoracic;  Laterality: Right;   TONSILLECTOMY     VIDEO ASSISTED THORACOSCOPY Right 07/26/2018   Procedure: VIDEO ASSISTED THORACOSCOPY;  Surgeon: Loreli Slot, MD;  Location: [facility] OR;  Service: Thoracic;  Laterality: Right;   VIDEO BRONCHOSCOPY WITH ENDOBRONCHIAL ULTRASOUND N/A 10/27/2021   Procedure: VIDEO BRONCHOSCOPY WITH ENDOBRONCHIAL ULTRASOUND;  Surgeon: Loreli Slot, MD;  Location: [facility] OR;  Service: Thoracic;  Laterality: N/A;    REVIEW OF SYSTEMS:  Constitutional: positive for fatigue and weight loss Eyes: negative Ears, nose, mouth, throat, and face: negative Respiratory: positive for cough, dyspnea on exertion, and wheezing Cardiovascular: negative Gastrointestinal: negative Genitourinary:negative Integument/breast:  negative Hematologic/lymphatic: negative Musculoskeletal:negative Neurological: negative Behavioral/Psych: negative Endocrine: negative Allergic/Immunologic: negative   PHYSICAL EXAMINATION: General appearance: alert, cooperative, fatigued, and no distress Head: Normocephalic, without obvious abnormality, atraumatic Neck: no adenopathy, no JVD, supple, symmetrical, trachea midline, and thyroid not enlarged, symmetric, no tenderness/mass/nodules Lymph nodes: Cervical, supraclavicular, and axillary nodes normal. Resp: wheezes bilaterally Back: symmetric, no curvature. ROM normal. No CVA tenderness. Cardio: regular rate and rhythm, S1, S2 normal, no murmur, click, rub or gallop GI: soft, non-tender; bowel sounds normal; no masses,  no organomegaly Extremities: extremities normal, atraumatic, no cyanosis or edema Neurologic: Alert and oriented X 3, normal strength and tone. Normal symmetric reflexes. Normal  coordination and gait  ECOG PERFORMANCE STATUS: 1 - Symptomatic but completely ambulatory   Blood pressure 107/61, pulse (!) 116, temperature 98 F (36.7 C), temperature source Oral, resp. rate 18, weight 128 lb 9.6 oz (58.3 kg), SpO2 93 %.  LABORATORY DATA: Lab Results  Component Value Date   WBC 7.2 06/06/2022   HGB 12.6 06/06/2022   HCT 38.4 06/06/2022   MCV 91.0 06/06/2022   PLT 213 06/06/2022      Chemistry      Component Value Date/Time   NA 138 06/06/2022 1412   K 3.0 (L) 06/06/2022 1412   CL 98 06/06/2022 1412   CO2 29 06/06/2022 1412   BUN 23 06/06/2022 1412   CREATININE 1.04 (H) 06/06/2022 1412      Component Value Date/Time   CALCIUM 8.6 (L) 06/06/2022 1412   ALKPHOS 65 06/06/2022 1412   AST 13 (L) 06/06/2022 1412   ALT 13 06/06/2022 1412   BILITOT 1.3 (H) 06/06/2022 1412       RADIOGRAPHIC STUDIES: CT Chest W Contrast  Result Date: 06/10/2022 CLINICAL DATA:  Restaging of non-small-cell lung cancer. Radiation therapy and chemotherapy complete last December. Immunotherapy ongoing. Recurrence in mediastinal nodes diagnosed in October of 2023. Radiation therapy to right-sided mediastinum. Right breast cancer with surgery only. Cough for 2 weeks. * Tracking Code: BO * EXAM: CT CHEST WITH CONTRAST TECHNIQUE: Multidetector CT imaging of the chest was performed during intravenous contrast administration. RADIATION DOSE REDUCTION: This exam was performed according to the departmental dose-optimization program which includes automated exposure control, adjustment of the mA and/or kV according to patient size and/or use of iterative reconstruction technique. CONTRAST:  75mL OMNIPAQUE IOHEXOL 300 MG/ML  SOLN COMPARISON:  05/03/2022 FINDINGS: Cardiovascular: Right Port-A-Cath tip high right atrium. Aortic atherosclerosis. Normal heart size, without pericardial effusion. Three vessel coronary artery calcification. No central pulmonary embolism, on this non-dedicated study.  Mediastinum/Nodes: No supraclavicular adenopathy. Right axillary node dissection. No mediastinal or hilar adenopathy. Tiny hiatal hernia. Lungs/Pleura: Tiny right pleural effusion is slightly decreased. Persistent areas of mild loculation posteromedially on 69/2 and anteriorly on 93/2. Mild centrilobular emphysema. Status post right lower lobectomy. Calcified granuloma along the right minor fissure. Evolution and progression of geographic airspace and ground-glass opacity within the medial right upper and right middle lobes, favored to be radiation induced. For example, there are new areas of anterior ground-glass and septal thickening including on 48/7. Progressive posterior right upper lobe consolidation with air bronchograms including on 51/7, at the site of ground-glass and nodular consolidation on the prior. New posterolateral right apical nodular consolidation including on 31/7. No convincing findings of pulmonary recurrent or metastatic disease. Upper Abdomen: Caudate and lateral segment left liver lobe enlargement. Subtle irregular hepatic capsule on 147/2. Normal imaged portions of the spleen, pancreas, adrenal glands, left kidney. Low-density right renal lesions  of up to 1.4 cm are likely cysts . In the absence of clinically indicated signs/symptoms require(s) no independent follow-up. Proximal gastric underdistention. Musculoskeletal: Mild osteopenia. Right mastectomy and breast reconstruction. IMPRESSION: 1. Evolution/progression of presumed radiation change within the paramediastinal right lung, status post right lower lobectomy. 2. No residual/recurrent mediastinal adenopathy. No convincing evidence of recurrent or metastatic disease. 3. Aortic atherosclerosis (ICD10-I70.0), coronary artery atherosclerosis and emphysema (ICD10-J43.9). 4. Hepatic morphology which is highly suspicious for cirrhosis. 5. Decrease in tiny right pleural effusion with mild loculated components. Electronically Signed   By:  Jeronimo Greaves M.D.   On: 06/10/2022 10:49     ASSESSMENT AND PLAN: This is a very pleasant 82 years old white female with stage IIIA (T1c, N2, M0) non-small cell lung cancer, adenocarcinoma initially diagnosed as diagnosed with a stage IA (T1c, N0, M0) non-small cell lung cancer, adenocarcinoma status post right lower lobectomy with lymph node dissection in July 2020.  She had disease recurrence in October 2023. The patient is currently on observation and she is feeling fine except for the dry cough. Her CT scan of the chest followed by PET scan showed increasing nodularity of the pleural surface in the right chest with ill-defined areas in the cardiophrenic recess and increasing size of lymph nodes in the mediastinum. The patient underwent bronchoscopy with EBUS under the care of Dr. 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 The patient started a course of concurrent chemoradiation with weekly carboplatin for AUC of 2 and paclitaxel 45 Mg/M2 status post 7 cycles.  She tolerated this treatment well except for the radiation-induced esophagitis as well as fatigue. The patient had repeat CT scan of the chest performed recently.  Her scan showed improvement of her disease especially in the mediastinal lymph nodes. The patient underwent consolidation treatment with immunotherapy with Imfinzi 1500 Mg IV every 4 weeks status post 3 cycles.  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  The patient was treated with a tapered dose of prednisone as well as 2 weeks course of doxycycline with no improvement in her symptoms. She had repeat CT scan of the chest performed recently.  The scan showed no residual or recurrent mediastinal lymphadenopathy and no evidence of recurrent or metastatic disease but there was evaluation and progression of presumed radiation changes within the paramediastinal right lung. I recommended for  the patient to see a pulmonologist for further evaluation and management of her condition.  I will start her on a Medrol Dosepak in the interval. Her treatment with immunotherapy will be discontinued because of the pneumonitis. I will see her back for follow-up visit in 3 months for evaluation with repeat CT scan of the chest for restaging of her disease. I will also arrange for the patient to have Port-A-Cath flush every 6 weeks during this time. She was advised to call immediately if she has any other concerning symptoms in the interval. The patient voices understanding of current disease status and treatment options and is in agreement with the current care plan. All questions were answered. The patient knows to call the clinic with any problems, questions or concerns. We can certainly see the patient much sooner if necessary. The total time spent in the appointment was 30 minutes.  Disclaimer: This note was dictated with voice recognition software. Similar sounding words can inadvertently be transcribed and may not be corrected upon review.

## 2022-06-15 NOTE — Progress Notes (Signed)
This nurse flushed patient port with normal saline, and the locked with Heparin, port de-accessed.  Patient tolerated well and was stable at discharge.

## 2022-06-28 ENCOUNTER — Encounter: Payer: Self-pay | Admitting: Pulmonary Disease

## 2022-06-28 ENCOUNTER — Ambulatory Visit: Payer: Medicare PPO | Admitting: Pulmonary Disease

## 2022-06-28 VITALS — BP 114/68 | HR 102 | Ht 62.0 in | Wt 124.0 lb

## 2022-06-28 DIAGNOSIS — R0602 Shortness of breath: Secondary | ICD-10-CM | POA: Diagnosis not present

## 2022-06-28 DIAGNOSIS — T50905A Adverse effect of unspecified drugs, medicaments and biological substances, initial encounter: Secondary | ICD-10-CM | POA: Diagnosis not present

## 2022-06-28 DIAGNOSIS — J984 Other disorders of lung: Secondary | ICD-10-CM

## 2022-06-28 LAB — SEDIMENTATION RATE: Sed Rate: 95 mm/hr — ABNORMAL HIGH (ref 0–30)

## 2022-06-28 LAB — BRAIN NATRIURETIC PEPTIDE: Pro B Natriuretic peptide (BNP): 37 pg/mL (ref 0.0–100.0)

## 2022-06-28 LAB — C-REACTIVE PROTEIN: CRP: 7.8 mg/dL (ref 0.5–20.0)

## 2022-06-28 MED ORDER — SULFAMETHOXAZOLE-TRIMETHOPRIM 800-160 MG PO TABS
1.0000 | ORAL_TABLET | ORAL | 2 refills | Status: DC
Start: 2022-06-29 — End: 2022-09-06

## 2022-06-28 MED ORDER — BREZTRI AEROSPHERE 160-9-4.8 MCG/ACT IN AERO: 2.0000 | INHALATION_SPRAY | Freq: Two times a day (BID) | RESPIRATORY_TRACT | 0 refills | Status: DC

## 2022-06-28 MED ORDER — PREDNISONE 20 MG PO TABS
40.0000 mg | ORAL_TABLET | Freq: Every day | ORAL | 1 refills | Status: DC
Start: 2022-06-28 — End: 2022-08-22

## 2022-06-28 NOTE — Patient Instructions (Addendum)
Start breztri inhaler 2 puffs twice daily with spacer - rinse mouth out after each use - let us know if you would like to continue on this inhaler  Start prednisone 40mg  daily until follow  Take bactrim DS 1 tab 3 days per week  We will check an echocardiogram to evaluate your heart function given the shortness of breath  We will check general inflammatory labs today  Follow up in 1 month

## 2022-06-28 NOTE — Progress Notes (Signed)
Synopsis: Referred in June 2024 for abnormal CT Chest by Si Gaul, MD  Subjective:   PATIENT ID: Vanessa Day GENDER: female DOB: 12-26-1940, MRN: [id number]  HPI  Chief Complaint  Patient presents with   Consult    Referred by Dr. Arbutus Ped for increased cough and SOB after radiation. Non-productive cough.    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 She was treated with 6 week prednisone taper and doxycycline.   She was seen 06/15/22 by Dr. Arbutus Ped and was feeling terrible with fatigue and weakness. She was started on a medrol dosepak and referred to pulmonary clinic.   CT Chest 5/14 shows evolution and progression of geographic airspace and ground glass opacity within the medial right upper and right middle lobes, favored to be radiation induced. New areas of anterior ground glass and septal thickening, progressive posterior right upper lobe consolidation with air bronchograms, previously nodular consolidation on prior scan.   She continues to have cough that is dry with intermittent wheezing and significant exertional dyspnea. She has fatigue and lack of appetite. She has been losing weight. She denies fevers, chills or sweats.  She is going to the beach in 10 days with her family and is hoping to feel better by then.  Past Medical History:  Diagnosis Date   Adenocarcinoma of lung, stage 1, right (HCC) 2020   Anxiety    Arthritis    back, feet, jaw - per patient "all over"   Breast cancer (HCC) 2000   Right breast   Family history of breast cancer    GERD (gastroesophageal reflux disease)    Graves disease    H/O hiatal hernia    Hyperlipidemia    Hypertension    IBS (irritable bowel  syndrome)    Mitral regurgitation    moderate mitral regurgitation 08/20/20 echo   Neck pain    PAF (paroxysmal atrial fibrillation) (HCC)    ~ 09/2020 in setting of hyperthyroidism   PONV (postoperative nausea and vomiting)    Pre-diabetes      Family History  Problem Relation Age of Onset   Breast cancer Mother        dx over 75   Prostate cancer Father        dx late 60s-early 28s   COPD Father    Cerebral palsy Brother    Lung cancer Maternal Uncle        smoker   Stroke Maternal Grandfather    Cancer Cousin        NOS   Breast cancer Daughter 30   Anesthesia problems Neg Hx    Hypotension Neg Hx    Malignant hyperthermia Neg Hx    Pseudochol deficiency Neg Hx      Social History   Socioeconomic History   Marital status: Married    Spouse name: Not on file   Number of children: Not on file   Years of education: Not on file   Highest education level: Not on file  Occupational History   Not on file  Tobacco Use   Smoking status: Former    Packs/day: 1.00    Years: 30.00    Additional pack years: 0.00    Total pack years: 30.00    Types:  Cigarettes    Quit date: 06/22/2002    Years since quitting: 20.0   Smokeless tobacco: Never  Vaping Use   Vaping Use: Never used  Substance and Sexual Activity   Alcohol use: Yes    Alcohol/week: 7.0 standard drinks of alcohol    Types: 7 Glasses of wine per week    Comment: small glass every night   Drug use: Yes    Types: Flunitrazepam   Sexual activity: Not Currently  Other Topics Concern   Not on file  Social History Narrative   Not on file   Social Determinants of Health   Financial Resource Strain: Not on file  Food Insecurity: No Food Insecurity (02/02/2022)   Hunger Vital Sign    Worried About Running Out of Food in the Last Year: Never true    Ran Out of Food in the Last Year: Never true  Transportation Needs: Not on file  Physical Activity: Not on file  Stress: Not on file  Social Connections: Not on  file  Intimate Partner Violence: Not on file     Allergies  Allergen Reactions   Hydrocodone Itching   Amitriptyline Hcl Other (See Comments)   Angiotensin Receptor Blockers Cough   Codeine Itching   Dicyclomine Hcl     Other reaction(s): Unknown   Hyoscyamine     Other reaction(s): Unknown   Nitrofurantoin Other (See Comments)    Unknown   Statins Other (See Comments)    Muscle soreness   Tramadol Hives   Oxycodone-Acetaminophen Anxiety   Pseudoephedrine Palpitations    Other reaction(s): Unknown   Sulfa Antibiotics Palpitations    Breakout around face ????     Outpatient Medications Prior to Visit  Medication Sig Dispense Refill   acetaminophen (TYLENOL) 500 MG tablet Take 1 tablet (500 mg total) by mouth every 6 (six) hours as needed for mild pain. (Patient taking differently: Take 1,000 mg by mouth every 6 (six) hours as needed for mild pain.) 30 tablet 0   aspirin EC 81 MG tablet Take 1 tablet (81 mg total) by mouth daily. Swallow whole. 90 tablet 3   azelastine (ASTELIN) 0.1 % nasal spray Place 1 spray into both nostrils daily as needed (Congestion). Use in each nostril as directed     benzonatate (TESSALON) 100 MG capsule Take 100 mg by mouth 3 (three) times daily as needed for cough.     celecoxib (CELEBREX) 200 MG capsule Take 200 mg by mouth 2 (two) times daily.     cetirizine (ZYRTEC) 10 MG tablet Take 10 mg by mouth daily as needed for allergies.     diphenoxylate-atropine (LOMOTIL) 2.5-0.025 MG tablet Take 1 tablet by mouth. As directed     ezetimibe (ZETIA) 10 MG tablet Take 10 mg by mouth daily.     hydrochlorothiazide (MICROZIDE) 12.5 MG capsule Take 1 capsule (12.5 mg total) by mouth daily. 45 capsule 3   lidocaine-prilocaine (EMLA) cream Apply 1 Application topically as needed. 30 g 2   LORazepam (ATIVAN) 1 MG tablet Take 1-2 mg by mouth at bedtime as needed for sleep.     losartan (COZAAR) 25 MG tablet Take 25 mg by mouth daily.     methimazole (TAPAZOLE) 5  MG tablet Take 5 mg by mouth daily.     oxyCODONE (ROXICODONE) 5 MG immediate release tablet Take 1 tablet (5 mg total) by mouth every 6 (six) hours as needed for severe pain. 7 tablet 0   Polyethyl Glycol-Propyl Glycol (SYSTANE OP)  Place 1 drop into both eyes 2 (two) times daily as needed (dry eyes).     senna-docusate (SENOKOT-S) 8.6-50 MG tablet Take 1 tablet by mouth daily. 30 tablet 0   amoxicillin-clavulanate (AUGMENTIN) 875-125 MG tablet Take 1 tablet by mouth 2 (two) times daily.     durvalumab (IMFINZI) 500 MG/10ML SOLN injection Inject 1,500 mg into the vein every 28 (twenty-eight) days.     methylPREDNISolone (MEDROL DOSEPAK) 4 MG TBPK tablet Use as instructed. 21 tablet 0   Facility-Administered Medications Prior to Visit  Medication Dose Route Frequency Provider Last Rate Last Admin   sodium chloride flush (NS) 0.9 % injection 10 mL  10 mL Intravenous PRN Si Gaul, MD   10 mL at 01/25/22 1534   Review of Systems  Constitutional:  Negative for chills, fever, malaise/fatigue and weight loss.  HENT:  Negative for congestion, sinus pain and sore throat.   Eyes: Negative.   Respiratory:  Positive for cough and shortness of breath. Negative for hemoptysis, sputum production and wheezing.   Cardiovascular:  Negative for chest pain, palpitations, orthopnea, claudication and leg swelling.  Gastrointestinal:  Positive for abdominal pain. Negative for heartburn, nausea and vomiting.  Genitourinary: Negative.   Musculoskeletal:  Positive for joint pain. Negative for myalgias.  Skin:  Negative for rash.  Neurological:  Negative for weakness.  Endo/Heme/Allergies: Negative.   Psychiatric/Behavioral:  The patient is nervous/anxious.    Objective:   Vitals:   06/28/22 1017  BP: 114/68  Pulse: (!) 102  SpO2: 97%  Weight: 124 lb (56.2 kg)  Height: 5\' 2"  (1.575 m)   Physical Exam Constitutional:      General: She is not in acute distress.    Appearance: She is not  ill-appearing.  HENT:     Head: Normocephalic and atraumatic.  Eyes:     General: No scleral icterus.    Conjunctiva/sclera: Conjunctivae normal.  Cardiovascular:     Rate and Rhythm: Normal rate and regular rhythm.     Pulses: Normal pulses.     Heart sounds: Normal heart sounds. No murmur heard. Pulmonary:     Effort: Pulmonary effort is normal.     Breath sounds: Rales present. No wheezing or rhonchi.     Comments: Bronchial breath sounds RUL Musculoskeletal:     Right lower leg: No edema.     Left lower leg: No edema.  Skin:    General: Skin is warm and dry.  Neurological:     General: No focal deficit present.     Mental Status: She is alert.    CBC    Component Value Date/Time   WBC 9.2 06/15/2022 1113   WBC 6.9 01/14/2022 2220   RBC 3.55 (L) 06/15/2022 1113   HGB 10.8 (L) 06/15/2022 1113   HCT 31.7 (L) 06/15/2022 1113   PLT 444 (H) 06/15/2022 1113   MCV 89.3 06/15/2022 1113   MCH 30.4 06/15/2022 1113   MCHC 34.1 06/15/2022 1113   RDW 14.3 06/15/2022 1113   LYMPHSABS 0.7 06/15/2022 1113   MONOABS 0.8 06/15/2022 1113   EOSABS 0.1 06/15/2022 1113   BASOSABS 0.0 06/15/2022 1113      Latest Ref Rng & Units 06/15/2022   11:13 AM 06/06/2022    2:12 PM 05/04/2022   10:07 AM  BMP  Glucose 70 - 99 mg/dL 161  096  045   BUN 8 - 23 mg/dL 18  23  27    Creatinine 0.44 - 1.00 mg/dL 4.09  8.11  1.08   Sodium 135 - 145 mmol/L 136  138  138   Potassium 3.5 - 5.1 mmol/L 3.4  3.0  3.3   Chloride 98 - 111 mmol/L 99  98  99   CO2 22 - 32 mmol/L 29  29  31    Calcium 8.9 - 10.3 mg/dL 8.7  8.6  9.4     Chest imaging: CT Chest 06/07/22 Mediastinum/Nodes: No supraclavicular adenopathy. Right axillary node dissection. No mediastinal or hilar adenopathy. Tiny hiatal hernia.   Lungs/Pleura: Tiny right pleural effusion is slightly decreased. Persistent areas of mild loculation posteromedially on 69/2 and anteriorly on 93/2.   Mild centrilobular emphysema.   Status post right  lower lobectomy.   Calcified granuloma along the right minor fissure.   Evolution and progression of geographic airspace and ground-glass opacity within the medial right upper and right middle lobes, favored to be radiation induced. For example, there are new areas of anterior ground-glass and septal thickening including on 48/7. Progressive posterior right upper lobe consolidation with air bronchograms including on 51/7, at the site of ground-glass and nodular consolidation on the prior. New posterolateral right apical nodular consolidation including on 31/7. No convincing findings of pulmonary recurrent or metastatic disease.  CT Chest 05/03/22 Mediastinum/Nodes: No mediastinal lymphadenopathy. There is no hilar lymphadenopathy. The esophagus has normal imaging features. There is no axillary lymphadenopathy.   Lungs/Pleura: Volume loss in the right hemithorax is compatible with right lower lobectomy. There is new interlobular septal thickening with a moderate sized patchy ground-glass opacity in the posterior right upper lung (see image 42/7). Along the medial aspect of this finding is a 1.8 x 2.0 cm irregular confluent nodular opacity.   Just inferior to this moderate area of ground-glass opacity is another 3.0 x 2.8 cm nodular consolidative lesion (59/7).   A third new nodular consolidative focus is seen in the anterior right lung on image 66/7 measuring 2.1 x 1.1 cm.   Fissural thickening noted in the area of fissural nodularity noted in the right lung previously. Tiny right pleural effusion is new in the interval with loculated anterior and paraspinal components.   Tiny peripheral left lower lobe nodule on 105/7 is unchanged. No suspicious pulmonary nodule or mass in the left lung.  CT Chest 01/31/22 1. Previously demonstrated perifissural and subpleural nodularity posteriorly in the right hemithorax has not progressed and may be slightly improved compared with the prior  CT. Continued follow-up recommended. 2. Previously demonstrated mildly hypermetabolic right paratracheal lymph node has decreased in size. 3. No evidence of local recurrence or progressive metastatic disease. 4.  Aortic Atherosclerosis  PFT:    Latest Ref Rng & Units 07/25/2018    9:21 AM  PFT Results  FVC-Pre L 2.54   FVC-Predicted Pre % 98   FVC-Post L 2.64   FVC-Predicted Post % 102   Pre FEV1/FVC % % 78   Post FEV1/FCV % % 75   FEV1-Pre L 1.97   FEV1-Predicted Pre % 102   FEV1-Post L 1.98   DLCO uncorrected ml/min/mmHg 15.20   DLCO UNC% % 83   DLCO corrected ml/min/mmHg 15.44   DLCO COR %Predicted % 84   DLVA Predicted % 91   TLC L 5.19   TLC % Predicted % 105   RV % Predicted % 115     Labs:  Path:  Echo: LV EF 60-65%. RV systolic size and function is normal. Mild elevated PASP . LA mildly dilated. Moderate mitral valve regurgitation.   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 Other differential includes radiation induced pneumonitis. Less likely infectious at this time.   Will start patient on 40mg  prednisone daily until  follow up in 1 month, she is to also take bactrim DS 1 tab 3 days per week.   She is to start breztri inhaler 2 puffs twice daily and rinse mouth out after each use  We will check general inflammatory markers today.  Follow up in 1 month.  Melody Comas, MD [facility] Office: [phone]   Current Outpatient Medications:    acetaminophen (TYLENOL) 500 MG tablet, Take 1 tablet (500 mg total) by mouth every 6 (six) hours as needed for mild pain. (Patient taking differently: Take 1,000 mg by mouth every 6 (six) hours as needed for mild pain.), Disp: 30 tablet, Rfl: 0   aspirin EC 81 MG tablet, Take 1 tablet (81 mg total) by mouth daily. Swallow whole., Disp: 90 tablet, Rfl: 3   azelastine (ASTELIN) 0.1 % nasal spray, Place 1 spray into both nostrils daily as needed (Congestion). Use in each nostril as directed, Disp: , Rfl:    benzonatate (TESSALON) 100 MG capsule, Take 100 mg by mouth 3 (three) times daily as needed for cough., Disp: , Rfl:    Budeson-Glycopyrrol-Formoterol (BREZTRI AEROSPHERE) 160-9-4.8 MCG/ACT AERO, Inhale 2 puffs into the lungs in the morning and at bedtime., Disp: 2 each, Rfl: 0   celecoxib (CELEBREX) 200 MG capsule, Take 200 mg by mouth 2 (two) times daily., Disp: , Rfl:    cetirizine (ZYRTEC) 10 MG tablet, Take 10 mg by mouth daily as needed for allergies., Disp: , Rfl:    diphenoxylate-atropine (LOMOTIL) 2.5-0.025 MG tablet, Take 1 tablet by mouth. As directed, Disp: , Rfl:    ezetimibe (ZETIA) 10 MG tablet, Take 10 mg by mouth daily., Disp: , Rfl:    hydrochlorothiazide (MICROZIDE) 12.5 MG capsule, Take 1 capsule (12.5 mg total) by mouth daily., Disp: 45 capsule, Rfl: 3   lidocaine-prilocaine (EMLA) cream, Apply 1 Application topically as needed., Disp: 30 g, Rfl: 2   LORazepam (ATIVAN) 1 MG tablet, Take 1-2 mg by mouth at bedtime as needed for sleep., Disp: , Rfl:    losartan (COZAAR) 25 MG tablet, Take 25 mg by mouth daily., Disp: , Rfl:     methimazole (TAPAZOLE) 5 MG tablet, Take 5 mg by mouth daily., Disp: , Rfl:    oxyCODONE (ROXICODONE) 5 MG immediate release tablet, Take 1 tablet (5 mg total) by mouth every 6 (six) hours as needed for severe pain., Disp: 7 tablet, Rfl: 0   Polyethyl Glycol-Propyl Glycol (SYSTANE OP), Place 1 drop into both eyes 2 (two) times daily as needed (dry eyes)., Disp: , Rfl:    predniSONE (DELTASONE) 20 MG tablet, Take  2 tablets (40 mg total) by mouth daily with breakfast., Disp: 60 tablet, Rfl: 1   senna-docusate (SENOKOT-S) 8.6-50 MG tablet, Take 1 tablet by mouth daily., Disp: 30 tablet, Rfl: 0   [START ON 06/29/2022] sulfamethoxazole-trimethoprim (BACTRIM DS) 800-160 MG tablet, Take 1 tablet by mouth 3 (three) times a week., Disp: 12 tablet, Rfl: 2 No current facility-administered medications for this visit.  Facility-Administered Medications Ordered in Other Visits:    sodium chloride flush (NS) 0.9 % injection 10 mL, 10 mL, Intravenous, PRN, Si Gaul, MD, 10 mL at 01/25/22 1534

## 2022-06-29 ENCOUNTER — Other Ambulatory Visit: Payer: Medicare PPO

## 2022-06-29 ENCOUNTER — Ambulatory Visit: Payer: Medicare PPO

## 2022-06-29 ENCOUNTER — Telehealth: Payer: Self-pay | Admitting: Pulmonary Disease

## 2022-06-29 ENCOUNTER — Ambulatory Visit: Payer: Medicare PPO | Admitting: Internal Medicine

## 2022-06-29 LAB — LACTATE DEHYDROGENASE: LDH: 138 U/L (ref 120–250)

## 2022-06-29 NOTE — Telephone Encounter (Signed)
Patient called to request that all her medical records be sent to her PCP with [facility].  Please advise and call patient to discuss further.  CB# [phone]

## 2022-06-30 ENCOUNTER — Encounter: Payer: Self-pay | Admitting: Pulmonary Disease

## 2022-06-30 DIAGNOSIS — R0602 Shortness of breath: Secondary | ICD-10-CM

## 2022-06-30 DIAGNOSIS — T50905A Adverse effect of unspecified drugs, medicaments and biological substances, initial encounter: Secondary | ICD-10-CM

## 2022-07-01 MED ORDER — IPRATROPIUM-ALBUTEROL 0.5-2.5 (3) MG/3ML IN SOLN: 3.0000 mL | Freq: Four times a day (QID) | RESPIRATORY_TRACT | 6 refills | Status: DC | PRN

## 2022-07-01 NOTE — Telephone Encounter (Signed)
Communication Routing History  Recipient Method Sent by Date Sent  Vanessa Raider, MD Fax Martina Sinner, MD 06/28/2022  Fax: [phone] Phone: [phone] Letter: Report only    I called and spoke with the pt and she was asking for ov note to be faxed to her PCP  This was already done  She was made aware, and nothing further needed

## 2022-07-01 NOTE — Telephone Encounter (Signed)
I called and notified the patient. I have sent the DuoNeb to [facility] and placed the order for the Nebulizer.  Nothing further needed.

## 2022-07-01 NOTE — Telephone Encounter (Signed)
Dr. Francine Graven- please see emails from pt and advise recommendations. Thanks!  It sort of does but I find it's difficult. Breath too deep and I have to cough it out so I try to do it slower. Less deep breathing. Do you rather I still use it rather than the nebula?      Vanessa Day, CMA  Vanessa Stable Thacker2 hours ago (9:53 AM)    Vanessa Day, Is the inhaler not working for you?    Vanessa Day  P [facility] (supporting Vanessa Sinner, MD)Yesterday (11:40 AM)    I have trouble with inhaler and would like to have nebulize to use at home. [facility] [address] [facility]?

## 2022-07-04 DIAGNOSIS — J702 Acute drug-induced interstitial lung disorders: Secondary | ICD-10-CM | POA: Diagnosis not present

## 2022-07-05 ENCOUNTER — Institutional Professional Consult (permissible substitution): Payer: Medicare PPO | Admitting: Pulmonary Disease

## 2022-07-13 ENCOUNTER — Other Ambulatory Visit: Payer: Medicare PPO

## 2022-07-13 ENCOUNTER — Ambulatory Visit: Payer: Medicare PPO

## 2022-07-13 ENCOUNTER — Ambulatory Visit: Payer: Medicare PPO | Admitting: Internal Medicine

## 2022-07-14 ENCOUNTER — Telehealth: Payer: Self-pay | Admitting: Pulmonary Disease

## 2022-07-14 NOTE — Telephone Encounter (Signed)
PT has been on Pred from Dr. Francine Graven and is having vision issues. She has a Sept eye appt but states things are blurry and she is concerned. Breathing better, still tired but thinks perhaps it is a blood sugar issue with the Pred. Still on Antibx Dr. Jovita Gamma her as well. Please call to advise.  [phone]

## 2022-07-15 NOTE — Telephone Encounter (Signed)
I called and spoke with the pt and notified of response per JD She verbalized understanding  Nothing further needed  Appt with JD was scheduled since APPS were full

## 2022-07-15 NOTE — Telephone Encounter (Signed)
Recommend she be seen by her primary care doctor for blood sugar evaluation. I would also recommend that she call her eye doctor and get in sooner. The high dose steroids can certainly affect her vision.

## 2022-07-15 NOTE — Telephone Encounter (Signed)
Spoke with patient. She is having some vision issues-eye sight is blurry and complains of dizziness. She seems to think its related to the prednisone, but isnt sure. She does have apt in sept with eye doctor in sept  Dr. Francine Graven can you please advise? She is requesting refills for antibiotic and prednisone. Unsure if she should continue taking with current symptoms, but also states it does make her feel much better

## 2022-07-15 NOTE — Telephone Encounter (Signed)
Recommend she be seen by her primary care doctor for blood sugar evaluation. I would also recommend that she call her eye doctor and get in sooner. The high dose steroids can certainly affect her vision.   She needs to be seen in our clinic the first week of July by an APP. If no visits available with an APP then double book her on 6/28 on my schedule.   Thanks, JD

## 2022-07-19 DIAGNOSIS — R2689 Other abnormalities of gait and mobility: Secondary | ICD-10-CM | POA: Diagnosis not present

## 2022-07-19 DIAGNOSIS — R42 Dizziness and giddiness: Secondary | ICD-10-CM | POA: Diagnosis not present

## 2022-07-19 DIAGNOSIS — H538 Other visual disturbances: Secondary | ICD-10-CM | POA: Diagnosis not present

## 2022-07-22 ENCOUNTER — Ambulatory Visit: Payer: Medicare PPO | Admitting: Pulmonary Disease

## 2022-07-22 ENCOUNTER — Encounter: Payer: Self-pay | Admitting: Pulmonary Disease

## 2022-07-22 VITALS — BP 120/70 | HR 100 | Ht 62.0 in | Wt 130.4 lb

## 2022-07-22 DIAGNOSIS — T50905A Adverse effect of unspecified drugs, medicaments and biological substances, initial encounter: Secondary | ICD-10-CM

## 2022-07-22 DIAGNOSIS — J984 Other disorders of lung: Secondary | ICD-10-CM | POA: Diagnosis not present

## 2022-07-22 DIAGNOSIS — H539 Unspecified visual disturbance: Secondary | ICD-10-CM | POA: Diagnosis not present

## 2022-07-22 NOTE — Progress Notes (Signed)
Synopsis: Referred in June 2024 for abnormal CT Chest by Si Gaul, MD  Subjective:   PATIENT ID: Vanessa Day GENDER: female DOB: 1940/09/08, MRN: [id number]  HPI  Chief Complaint  Patient presents with   Follow-up    F/up, breathing better on prednisone, dizziness.   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  Initial OV 06/28/22 She was seen 06/15/22 by Dr. Arbutus Ped and was feeling terrible with fatigue and weakness. She was started on a medrol dosepak and referred to pulmonary clinic.   CT Chest 5/14 shows evolution and progression of geographic airspace and ground glass opacity within the medial right upper and right middle lobes, favored to be radiation induced. New areas of anterior ground glass and septal thickening, progressive posterior right upper lobe consolidation with air bronchograms, previously nodular consolidation on prior scan.   She continues to have cough that is dry with intermittent wheezing and significant exertional dyspnea. She has fatigue and lack of appetite. She has been losing weight. She denies fevers, chills or sweats.  She is going to the beach in 10 days with her family and is hoping to feel better by then.   Today - 07/22/22 She is feeling better from a respiratory standpoint. Dyspnea is better and denies cough. She complains of dizziness and blurry vision. She had blurry vision prior to starting steroid taper. She has been on 49m prednisone since 6/5. She is taking bactrim DS 1 tab 3 days per week.   Past Medical History:  Diagnosis Date   Adenocarcinoma of lung, stage 1, right (HCC) 2020   Anxiety    Arthritis    back, feet, jaw - per patient "all over"    Breast cancer (HCC) 2000   Right breast   Family history of breast cancer    GERD (gastroesophageal reflux disease)    Graves disease    H/O hiatal hernia    Hyperlipidemia    Hypertension    IBS (irritable bowel syndrome)    Mitral regurgitation    moderate mitral regurgitation 08/20/20 echo   Neck pain    PAF (paroxysmal atrial fibrillation) (HCC)    ~ 09/2020 in setting of hyperthyroidism   PONV (postoperative nausea and vomiting)    Pre-diabetes      Family History  Problem Relation Age of Onset   Breast cancer Mother        dx over 55   Prostate cancer Father        dx late 60s-early 32s   COPD Father    Cerebral palsy Brother    Lung cancer Maternal Uncle        smoker   Stroke Maternal Grandfather    Cancer Cousin        NOS   Breast cancer Daughter 25   Anesthesia problems Neg Hx    Hypotension Neg Hx    Malignant hyperthermia Neg Hx    Pseudochol deficiency Neg Hx      Social History   Socioeconomic History   Marital status: Married    Spouse name: Not on file   Number of children: Not on file   Years of education: Not on file   Highest education level: Not on file  Occupational History  Not on file  Tobacco Use   Smoking status: Former    Packs/day: 1.00    Years: 30.00    Additional pack years: 0.00    Total pack years: 30.00    Types: Cigarettes    Quit date: 06/22/2002    Years since quitting: 20.0   Smokeless tobacco: Never  Vaping Use   Vaping Use: Never used  Substance and Sexual Activity   Alcohol use: Yes    Alcohol/week: 7.0 standard drinks of alcohol    Types: 7 Glasses of wine per week    Comment: small glass every night   Drug use: Yes    Types: Flunitrazepam   Sexual activity: Not Currently  Other Topics Concern   Not on file  Social History Narrative   Not on file   Social Determinants of Health   Financial Resource Strain: Not on file  Food Insecurity: No Food Insecurity (02/02/2022)   Hunger Vital Sign    Worried  About Running Out of Food in the Last Year: Never true    Ran Out of Food in the Last Year: Never true  Transportation Needs: Not on file  Physical Activity: Not on file  Stress: Not on file  Social Connections: Not on file  Intimate Partner Violence: Not on file     Allergies  Allergen Reactions   Hydrocodone Itching   Amitriptyline Hcl Other (See Comments)   Angiotensin Receptor Blockers Cough   Codeine Itching   Dicyclomine Hcl     Other reaction(s): Unknown   Hyoscyamine     Other reaction(s): Unknown   Nitrofurantoin Other (See Comments)    Unknown   Statins Other (See Comments)    Muscle soreness   Tramadol Hives   Oxycodone-Acetaminophen Anxiety   Pseudoephedrine Palpitations    Other reaction(s): Unknown   Sulfa Antibiotics Palpitations    Breakout around face ????     Outpatient Medications Prior to Visit  Medication Sig Dispense Refill   acetaminophen (TYLENOL) 500 MG tablet Take 1 tablet (500 mg total) by mouth every 6 (six) hours as needed for mild pain. (Patient taking differently: Take 1,000 mg by mouth every 6 (six) hours as needed for mild pain.) 30 tablet 0   aspirin EC 81 MG tablet Take 1 tablet (81 mg total) by mouth daily. Swallow whole. 90 tablet 3   azelastine (ASTELIN) 0.1 % nasal spray Place 1 spray into both nostrils daily as needed (Congestion). Use in each nostril as directed     benzonatate (TESSALON) 100 MG capsule Take 100 mg by mouth 3 (three) times daily as needed for cough.     celecoxib (CELEBREX) 200 MG capsule Take 200 mg by mouth 2 (two) times daily.     cetirizine (ZYRTEC) 10 MG tablet Take 10 mg by mouth daily as needed for allergies.     diphenoxylate-atropine (LOMOTIL) 2.5-0.025 MG tablet Take 1 tablet by mouth. As directed     ezetimibe (ZETIA) 10 MG tablet Take 10 mg by mouth daily.     hydrochlorothiazide (MICROZIDE) 12.5 MG capsule Take 1 capsule (12.5 mg total) by mouth daily. 45 capsule 3   ipratropium-albuterol (DUONEB) 0.5-2.5  (3) MG/3ML SOLN Take 3 mLs by nebulization every 6 (six) hours as needed. 360 mL 6   lidocaine-prilocaine (EMLA) cream Apply 1 Application topically as needed. 30 g 2   LORazepam (ATIVAN) 1 MG tablet Take 1-2 mg by mouth at bedtime as needed for sleep.     losartan (COZAAR) 25  MG tablet Take 25 mg by mouth daily.     methimazole (TAPAZOLE) 5 MG tablet Take 5 mg by mouth daily.     oxyCODONE (ROXICODONE) 5 MG immediate release tablet Take 1 tablet (5 mg total) by mouth every 6 (six) hours as needed for severe pain. 7 tablet 0   Polyethyl Glycol-Propyl Glycol (SYSTANE OP) Place 1 drop into both eyes 2 (two) times daily as needed (dry eyes).     predniSONE (DELTASONE) 20 MG tablet Take 2 tablets (40 mg total) by mouth daily with breakfast. 60 tablet 1   senna-docusate (SENOKOT-S) 8.6-50 MG tablet Take 1 tablet by mouth daily. 30 tablet 0   sulfamethoxazole-trimethoprim (BACTRIM DS) 800-160 MG tablet Take 1 tablet by mouth 3 (three) times a week. 12 tablet 2   Budeson-Glycopyrrol-Formoterol (BREZTRI AEROSPHERE) 160-9-4.8 MCG/ACT AERO Inhale 2 puffs into the lungs in the morning and at bedtime. 2 each 0   Facility-Administered Medications Prior to Visit  Medication Dose Route Frequency Provider Last Rate Last Admin   sodium chloride flush (NS) 0.9 % injection 10 mL  10 mL Intravenous PRN Si Gaul, MD   10 mL at 01/25/22 1534   Review of Systems  Constitutional:  Negative for chills, fever, malaise/fatigue and weight loss.  HENT:  Negative for congestion, sinus pain and sore throat.   Eyes: Negative.   Respiratory:  Negative for cough, hemoptysis, sputum production, shortness of breath and wheezing.   Cardiovascular:  Negative for chest pain, palpitations, orthopnea, claudication and leg swelling.  Gastrointestinal:  Negative for abdominal pain, heartburn, nausea and vomiting.  Genitourinary: Negative.   Musculoskeletal:  Negative for joint pain and myalgias.  Skin:  Negative for rash.   Neurological:  Positive for dizziness. Negative for weakness.  Endo/Heme/Allergies: Negative.   Psychiatric/Behavioral:  The patient is nervous/anxious.    Objective:   Vitals:   07/22/22 1119  BP: 120/70  Pulse: 100  SpO2: 97%  Weight: 130 lb 6.4 oz (59.1 kg)  Height: 5\' 2"  (1.575 m)   Physical Exam Constitutional:      General: She is not in acute distress.    Appearance: She is not ill-appearing.  HENT:     Head: Normocephalic and atraumatic.  Eyes:     General: No scleral icterus.    Conjunctiva/sclera: Conjunctivae normal.  Cardiovascular:     Rate and Rhythm: Normal rate and regular rhythm.     Pulses: Normal pulses.     Heart sounds: Normal heart sounds. No murmur heard. Pulmonary:     Effort: Pulmonary effort is normal.     Breath sounds: No wheezing, rhonchi or rales.  Musculoskeletal:     Right lower leg: No edema.     Left lower leg: No edema.  Skin:    General: Skin is warm and dry.  Neurological:     General: No focal deficit present.     Mental Status: She is alert.    CBC    Component Value Date/Time   WBC 9.2 06/15/2022 1113   WBC 6.9 01/14/2022 2220   RBC 3.55 (L) 06/15/2022 1113   HGB 10.8 (L) 06/15/2022 1113   HCT 31.7 (L) 06/15/2022 1113   PLT 444 (H) 06/15/2022 1113   MCV 89.3 06/15/2022 1113   MCH 30.4 06/15/2022 1113   MCHC 34.1 06/15/2022 1113   RDW 14.3 06/15/2022 1113   LYMPHSABS 0.7 06/15/2022 1113   MONOABS 0.8 06/15/2022 1113   EOSABS 0.1 06/15/2022 1113   BASOSABS 0.0 06/15/2022 1113  Latest Ref Rng & Units 06/15/2022   11:13 AM 06/06/2022    2:12 PM 05/04/2022   10:07 AM  BMP  Glucose 70 - 99 mg/dL 409  811  914   BUN 8 - 23 mg/dL 18  23  27    Creatinine 0.44 - 1.00 mg/dL 7.82  9.56  2.13   Sodium 135 - 145 mmol/L 136  138  138   Potassium 3.5 - 5.1 mmol/L 3.4  3.0  3.3   Chloride 98 - 111 mmol/L 99  98  99   CO2 22 - 32 mmol/L 29  29  31    Calcium 8.9 - 10.3 mg/dL 8.7  8.6  9.4     Chest imaging: CT Chest  06/07/22 Mediastinum/Nodes: No supraclavicular adenopathy. Right axillary node dissection. No mediastinal or hilar adenopathy. Tiny hiatal hernia.   Lungs/Pleura: Tiny right pleural effusion is slightly decreased. Persistent areas of mild loculation posteromedially on 69/2 and anteriorly on 93/2.   Mild centrilobular emphysema.   Status post right lower lobectomy.   Calcified granuloma along the right minor fissure.   Evolution and progression of geographic airspace and ground-glass opacity within the medial right upper and right middle lobes, favored to be radiation induced. For example, there are new areas of anterior ground-glass and septal thickening including on 48/7. Progressive posterior right upper lobe consolidation with air bronchograms including on 51/7, at the site of ground-glass and nodular consolidation on the prior. New posterolateral right apical nodular consolidation including on 31/7. No convincing findings of pulmonary recurrent or metastatic disease.  CT Chest 05/03/22 Mediastinum/Nodes: No mediastinal lymphadenopathy. There is no hilar lymphadenopathy. The esophagus has normal imaging features. There is no axillary lymphadenopathy.   Lungs/Pleura: Volume loss in the right hemithorax is compatible with right lower lobectomy. There is new interlobular septal thickening with a moderate sized patchy ground-glass opacity in the posterior right upper lung (see image 42/7). Along the medial aspect of this finding is a 1.8 x 2.0 cm irregular confluent nodular opacity.   Just inferior to this moderate area of ground-glass opacity is another 3.0 x 2.8 cm nodular consolidative lesion (59/7).   A third new nodular consolidative focus is seen in the anterior right lung on image 66/7 measuring 2.1 x 1.1 cm.   Fissural thickening noted in the area of fissural nodularity noted in the right lung previously. Tiny right pleural effusion is new in the interval with  loculated anterior and paraspinal components.   Tiny peripheral left lower lobe nodule on 105/7 is unchanged. No suspicious pulmonary nodule or mass in the left lung.  CT Chest 01/31/22 1. Previously demonstrated perifissural and subpleural nodularity posteriorly in the right hemithorax has not progressed and may be slightly improved compared with the prior CT. Continued follow-up recommended. 2. Previously demonstrated mildly hypermetabolic right paratracheal lymph node has decreased in size. 3. No evidence of local recurrence or progressive metastatic disease. 4.  Aortic Atherosclerosis  PFT:    Latest Ref Rng & Units 07/25/2018    9:21 AM  PFT Results  FVC-Pre L 2.54   FVC-Predicted Pre % 98   FVC-Post L 2.64   FVC-Predicted Post % 102   Pre FEV1/FVC % % 78   Post FEV1/FCV % % 75   FEV1-Pre L 1.97   FEV1-Predicted Pre % 102   FEV1-Post L 1.98   DLCO uncorrected ml/min/mmHg 15.20   DLCO UNC% % 83   DLCO corrected ml/min/mmHg 15.44   DLCO COR %Predicted % 84  DLVA Predicted % 91   TLC L 5.19   TLC % Predicted % 105   RV % Predicted % 115     Labs:  Path:  Echo: LV EF 60-65%. RV systolic size and function is normal. Mild elevated PASP . LA mildly dilated. Moderate mitral valve regurgitation.   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 Other differential includes radiation induced pneumonitis. Less likely infectious at this time.   She was started on 40mg  prednisone 6/5. She can taper down to 30mg  daily for 4 weeeks and then taper to 20mg  daily. She is to take bactrim DS 1 tab 3 days per week.   She can continue to use nebulizer treatments as needed.  Referral to ophthalmology placed for evaluation of her vision changes. We will request records from [facility] regarding her lab work from yesterday.  Follow up as scheduled in August.  Melody Comas, MD [facility] Office: [phone]   Current Outpatient Medications:    acetaminophen (TYLENOL) 500 MG tablet, Take 1 tablet (500 mg total) by mouth every 6 (six) hours as needed for mild pain. (Patient taking differently: Take 1,000 mg by mouth every 6 (six) hours as needed for mild pain.), Disp: 30 tablet, Rfl: 0   aspirin EC 81 MG tablet, Take 1 tablet (81 mg total) by mouth daily. Swallow whole., Disp: 90 tablet, Rfl: 3   azelastine (ASTELIN) 0.1 % nasal spray, Place 1 spray into both nostrils daily as needed (Congestion). Use in each nostril as directed, Disp: , Rfl:    benzonatate (TESSALON) 100 MG capsule, Take 100 mg by mouth 3 (three) times daily as needed for cough., Disp: , Rfl:    celecoxib (CELEBREX) 200 MG capsule, Take 200 mg by mouth 2 (two) times daily., Disp: , Rfl:    cetirizine (ZYRTEC) 10 MG tablet, Take 10 mg by mouth daily as needed for allergies., Disp: , Rfl:    diphenoxylate-atropine (LOMOTIL) 2.5-0.025 MG tablet, Take 1 tablet by mouth. As directed, Disp: , Rfl:    ezetimibe (ZETIA) 10 MG tablet, Take 10 mg by mouth daily., Disp: , Rfl:    hydrochlorothiazide (MICROZIDE) 12.5 MG capsule, Take 1 capsule (12.5 mg total) by mouth daily., Disp: 45 capsule, Rfl: 3   ipratropium-albuterol (DUONEB) 0.5-2.5 (3) MG/3ML SOLN, Take 3 mLs by nebulization every 6 (six) hours as needed.,  Disp: 360 mL, Rfl: 6   lidocaine-prilocaine (EMLA) cream, Apply 1 Application topically as needed., Disp: 30 g, Rfl: 2   LORazepam (ATIVAN) 1 MG tablet, Take 1-2 mg by mouth at bedtime as needed for sleep., Disp: , Rfl:    losartan (COZAAR) 25 MG tablet, Take 25 mg by mouth daily., Disp: , Rfl:    methimazole (TAPAZOLE) 5 MG tablet, Take 5 mg by mouth daily., Disp: , Rfl:    oxyCODONE (ROXICODONE) 5 MG immediate release tablet, Take 1 tablet (  5 mg total) by mouth every 6 (six) hours as needed for severe pain., Disp: 7 tablet, Rfl: 0   Polyethyl Glycol-Propyl Glycol (SYSTANE OP), Place 1 drop into both eyes 2 (two) times daily as needed (dry eyes)., Disp: , Rfl:    predniSONE (DELTASONE) 20 MG tablet, Take 2 tablets (40 mg total) by mouth daily with breakfast., Disp: 60 tablet, Rfl: 1   senna-docusate (SENOKOT-S) 8.6-50 MG tablet, Take 1 tablet by mouth daily., Disp: 30 tablet, Rfl: 0   sulfamethoxazole-trimethoprim (BACTRIM DS) 800-160 MG tablet, Take 1 tablet by mouth 3 (three) times a week., Disp: 12 tablet, Rfl: 2 No current facility-administered medications for this visit.  Facility-Administered Medications Ordered in Other Visits:    sodium chloride flush (NS) 0.9 % injection 10 mL, 10 mL, Intravenous, PRN, Si Gaul, MD, 10 mL at 01/25/22 1534

## 2022-07-22 NOTE — Patient Instructions (Addendum)
Reduce steroid dose to 30mg  daily for 1 month  Then reduce steroid dose to 20mg  daily on 08/13/22  Continue Bactrim 1 tab 3 days per week   We will refer you to an eye doctor for further evaluation  We will request the labs from [facility] for further evaluation  Follow up 8/13 as scheduled

## 2022-07-25 ENCOUNTER — Encounter: Payer: Self-pay | Admitting: Pulmonary Disease

## 2022-07-29 ENCOUNTER — Ambulatory Visit (HOSPITAL_COMMUNITY): Payer: Medicare PPO

## 2022-07-29 DIAGNOSIS — H04123 Dry eye syndrome of bilateral lacrimal glands: Secondary | ICD-10-CM | POA: Diagnosis not present

## 2022-07-29 DIAGNOSIS — H1820 Unspecified corneal edema: Secondary | ICD-10-CM | POA: Diagnosis not present

## 2022-08-03 DIAGNOSIS — J702 Acute drug-induced interstitial lung disorders: Secondary | ICD-10-CM | POA: Diagnosis not present

## 2022-08-09 DIAGNOSIS — R350 Frequency of micturition: Secondary | ICD-10-CM | POA: Diagnosis not present

## 2022-08-10 ENCOUNTER — Other Ambulatory Visit: Payer: Self-pay

## 2022-08-10 ENCOUNTER — Ambulatory Visit: Payer: Medicare PPO

## 2022-08-10 ENCOUNTER — Other Ambulatory Visit: Payer: Medicare PPO

## 2022-08-10 ENCOUNTER — Inpatient Hospital Stay: Payer: Medicare PPO | Attending: Internal Medicine

## 2022-08-10 ENCOUNTER — Ambulatory Visit: Payer: Medicare PPO | Admitting: Physician Assistant

## 2022-08-10 DIAGNOSIS — C3491 Malignant neoplasm of unspecified part of right bronchus or lung: Secondary | ICD-10-CM

## 2022-08-10 DIAGNOSIS — C3431 Malignant neoplasm of lower lobe, right bronchus or lung: Secondary | ICD-10-CM | POA: Diagnosis not present

## 2022-08-10 DIAGNOSIS — Z95828 Presence of other vascular implants and grafts: Secondary | ICD-10-CM

## 2022-08-10 DIAGNOSIS — Z452 Encounter for adjustment and management of vascular access device: Secondary | ICD-10-CM | POA: Insufficient documentation

## 2022-08-10 MED ORDER — HEPARIN SOD (PORK) LOCK FLUSH 100 UNIT/ML IV SOLN
500.0000 [IU] | Freq: Once | INTRAVENOUS | Status: AC
  Administered 2022-08-10: 500 [IU]

## 2022-08-10 MED ORDER — SODIUM CHLORIDE 0.9% FLUSH
10.0000 mL | Freq: Once | INTRAVENOUS | Status: AC
  Administered 2022-08-10: 10 mL

## 2022-08-18 DIAGNOSIS — E538 Deficiency of other specified B group vitamins: Secondary | ICD-10-CM | POA: Diagnosis not present

## 2022-08-18 DIAGNOSIS — I10 Essential (primary) hypertension: Secondary | ICD-10-CM | POA: Diagnosis not present

## 2022-08-18 DIAGNOSIS — D84821 Immunodeficiency due to drugs: Secondary | ICD-10-CM | POA: Diagnosis not present

## 2022-08-18 DIAGNOSIS — E782 Mixed hyperlipidemia: Secondary | ICD-10-CM | POA: Diagnosis not present

## 2022-08-18 DIAGNOSIS — J8489 Other specified interstitial pulmonary diseases: Secondary | ICD-10-CM | POA: Diagnosis not present

## 2022-08-18 DIAGNOSIS — R300 Dysuria: Secondary | ICD-10-CM | POA: Diagnosis not present

## 2022-08-18 DIAGNOSIS — E1122 Type 2 diabetes mellitus with diabetic chronic kidney disease: Secondary | ICD-10-CM | POA: Diagnosis not present

## 2022-08-18 DIAGNOSIS — C3491 Malignant neoplasm of unspecified part of right bronchus or lung: Secondary | ICD-10-CM | POA: Diagnosis not present

## 2022-08-18 DIAGNOSIS — N1831 Chronic kidney disease, stage 3a: Secondary | ICD-10-CM | POA: Diagnosis not present

## 2022-08-18 DIAGNOSIS — E0590 Thyrotoxicosis, unspecified without thyrotoxic crisis or storm: Secondary | ICD-10-CM | POA: Diagnosis not present

## 2022-08-20 ENCOUNTER — Other Ambulatory Visit: Payer: Self-pay | Admitting: Pulmonary Disease

## 2022-08-20 DIAGNOSIS — J984 Other disorders of lung: Secondary | ICD-10-CM

## 2022-08-22 DIAGNOSIS — E0590 Thyrotoxicosis, unspecified without thyrotoxic crisis or storm: Secondary | ICD-10-CM | POA: Diagnosis not present

## 2022-08-29 DIAGNOSIS — E0590 Thyrotoxicosis, unspecified without thyrotoxic crisis or storm: Secondary | ICD-10-CM | POA: Diagnosis not present

## 2022-08-30 DIAGNOSIS — H16223 Keratoconjunctivitis sicca, not specified as Sjogren's, bilateral: Secondary | ICD-10-CM | POA: Diagnosis not present

## 2022-08-30 DIAGNOSIS — H5212 Myopia, left eye: Secondary | ICD-10-CM | POA: Diagnosis not present

## 2022-08-30 DIAGNOSIS — H04223 Epiphora due to insufficient drainage, bilateral lacrimal glands: Secondary | ICD-10-CM | POA: Diagnosis not present

## 2022-08-30 DIAGNOSIS — H31003 Unspecified chorioretinal scars, bilateral: Secondary | ICD-10-CM | POA: Diagnosis not present

## 2022-08-30 DIAGNOSIS — H26492 Other secondary cataract, left eye: Secondary | ICD-10-CM | POA: Diagnosis not present

## 2022-09-03 DIAGNOSIS — J702 Acute drug-induced interstitial lung disorders: Secondary | ICD-10-CM | POA: Diagnosis not present

## 2022-09-06 ENCOUNTER — Ambulatory Visit: Payer: Medicare PPO | Admitting: Pulmonary Disease

## 2022-09-06 ENCOUNTER — Encounter: Payer: Self-pay | Admitting: Pulmonary Disease

## 2022-09-06 VITALS — BP 104/68 | HR 84 | Temp 97.1°F | Ht 62.0 in | Wt 131.4 lb

## 2022-09-06 DIAGNOSIS — J984 Other disorders of lung: Secondary | ICD-10-CM | POA: Diagnosis not present

## 2022-09-06 DIAGNOSIS — T50905A Adverse effect of unspecified drugs, medicaments and biological substances, initial encounter: Secondary | ICD-10-CM | POA: Diagnosis not present

## 2022-09-06 MED ORDER — PREDNISONE 5 MG PO TABS: 5.0000 mg | ORAL_TABLET | Freq: Every day | ORAL | 0 refills | Status: DC

## 2022-09-06 MED ORDER — PREDNISONE 10 MG PO TABS: 10.0000 mg | ORAL_TABLET | Freq: Every day | ORAL | 0 refills | Status: DC

## 2022-09-06 MED ORDER — SULFAMETHOXAZOLE-TRIMETHOPRIM 800-160 MG PO TABS
1.0000 | ORAL_TABLET | ORAL | 0 refills | Status: DC
Start: 2022-09-07 — End: 2022-12-12

## 2022-09-06 NOTE — Progress Notes (Signed)
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  Initial OV 06/28/22 She was seen 06/15/22 by Dr. Arbutus Ped and was feeling terrible with fatigue and weakness. She was started on a medrol dosepak and referred to pulmonary clinic.   CT Chest 5/14 shows evolution and progression of geographic airspace and ground glass opacity within the medial right upper and right middle lobes, favored to be radiation induced. New areas of anterior ground glass and septal thickening, progressive posterior right upper lobe consolidation with air bronchograms, previously nodular consolidation on prior scan.   She continues to have cough that is dry with intermittent wheezing and significant exertional dyspnea. She has fatigue and lack of appetite. She has been losing weight. She denies fevers, chills or sweats.  She is going to the beach in 10 days with her family and is hoping to feel better by then.   OV - 07/22/22 She is feeling better from a respiratory standpoint. Dyspnea is better and denies cough. She complains of dizziness and blurry vision. She had blurry vision prior to starting steroid taper. She has been on 42m prednisone since 6/5. She is taking bactrim DS 1 tab 3 days per week.   Today OV - 09/06/22 She has been feeling much better since last visit. She has not noticed any increase in symptoms since tapering down to 20mg  daily from 40mg  at the start. She has continued  bactrim 1 tab 3 days per week.  Past Medical History:  Diagnosis Date   Adenocarcinoma of lung, stage 1, right (HCC) 2020   Anxiety    Arthritis    back, feet, jaw - per patient "all over"   Breast cancer (HCC) 2000   Right breast   Family history of breast cancer    GERD (gastroesophageal reflux disease)    Graves disease    H/O hiatal hernia    Hyperlipidemia    Hypertension    IBS (irritable bowel syndrome)    Mitral regurgitation    moderate mitral regurgitation 08/20/20 echo   Neck pain    PAF (paroxysmal atrial fibrillation) (HCC)    ~ 09/2020 in setting of hyperthyroidism   PONV (postoperative nausea and vomiting)    Pre-diabetes      Family History  Problem Relation Age of Onset   Breast cancer Mother        dx over 24   Prostate cancer Father        dx late 60s-early 44s   COPD Father    Cerebral palsy Brother    Lung cancer Maternal Uncle        smoker   Stroke Maternal Grandfather    Cancer Cousin        NOS   Breast cancer Daughter 78   Anesthesia problems Neg Hx    Hypotension Neg Hx    Malignant hyperthermia Neg Hx    Pseudochol deficiency Neg Hx      Social History   Socioeconomic History  Marital status: Married    Spouse name: Not on file   Number of children: Not on file   Years of education: Not on file   Highest education level: Not on file  Occupational History   Not on file  Tobacco Use   Smoking status: Former    Current packs/day: 0.00    Average packs/day: 1 pack/day for 30.0 years (30.0 ttl pk-yrs)    Types: Cigarettes    Start date: 06/21/1972    Quit date: 06/22/2002    Years since quitting: 20.2   Smokeless tobacco: Never  Vaping Use   Vaping status: Never Used  Substance and Sexual Activity   Alcohol use: Yes    Alcohol/week: 7.0 standard drinks of alcohol    Types: 7 Glasses of wine per week    Comment: small glass every night   Drug use: Yes    Types: Flunitrazepam   Sexual activity: Not Currently  Other Topics  Concern   Not on file  Social History Narrative   Not on file   Social Determinants of Health   Financial Resource Strain: Not on file  Food Insecurity: No Food Insecurity (02/02/2022)   Hunger Vital Sign    Worried About Running Out of Food in the Last Year: Never true    Ran Out of Food in the Last Year: Never true  Transportation Needs: Not on file  Physical Activity: Not on file  Stress: Not on file  Social Connections: Not on file  Intimate Partner Violence: Not on file     Allergies  Allergen Reactions   Hydrocodone Itching   Amitriptyline Hcl Other (See Comments)   Angiotensin Receptor Blockers Cough   Codeine Itching   Dicyclomine Hcl     Other reaction(s): Unknown   Hyoscyamine     Other reaction(s): Unknown   Nitrofurantoin Other (See Comments)    Unknown   Statins Other (See Comments)    Muscle soreness   Tramadol Hives   Oxycodone-Acetaminophen Anxiety   Pseudoephedrine Palpitations    Other reaction(s): Unknown   Sulfa Antibiotics Palpitations    Breakout around face ????     Outpatient Medications Prior to Visit  Medication Sig Dispense Refill   acetaminophen (TYLENOL) 500 MG tablet Take 1 tablet (500 mg total) by mouth every 6 (six) hours as needed for mild pain. (Patient taking differently: Take 1,000 mg by mouth every 6 (six) hours as needed for mild pain.) 30 tablet 0   aspirin EC 81 MG tablet Take 1 tablet (81 mg total) by mouth daily. Swallow whole. 90 tablet 3   azelastine (ASTELIN) 0.1 % nasal spray Place 1 spray into both nostrils daily as needed (Congestion). Use in each nostril as directed     cetirizine (ZYRTEC) 10 MG tablet Take 10 mg by mouth daily as needed for allergies.     diphenoxylate-atropine (LOMOTIL) 2.5-0.025 MG tablet Take 1 tablet by mouth. As directed     ezetimibe (ZETIA) 10 MG tablet Take 10 mg by mouth daily.     hydrochlorothiazide (MICROZIDE) 12.5 MG capsule Take 1 capsule (12.5 mg total) by mouth daily. 45 capsule 3    ipratropium-albuterol (DUONEB) 0.5-2.5 (3) MG/3ML SOLN Take 3 mLs by nebulization every 6 (six) hours as needed. 360 mL 6   lidocaine-prilocaine (EMLA) cream Apply 1 Application topically as needed. 30 g 2   LORazepam (ATIVAN) 1 MG tablet Take 1-2 mg by mouth at bedtime as needed for sleep.     losartan (COZAAR)  25 MG tablet Take 25 mg by mouth daily.     methimazole (TAPAZOLE) 5 MG tablet Take 5 mg by mouth daily.     Polyethyl Glycol-Propyl Glycol (SYSTANE OP) Place 1 drop into both eyes 2 (two) times daily as needed (dry eyes).     prednisoLONE acetate (PRED FORTE) 1 % ophthalmic suspension 1 drop 2 (two) times daily.     predniSONE (DELTASONE) 20 MG tablet Take 1 tablet (20 mg total) by mouth daily with breakfast. 30 tablet 0   senna-docusate (SENOKOT-S) 8.6-50 MG tablet Take 1 tablet by mouth daily. 30 tablet 0   sulfamethoxazole-trimethoprim (BACTRIM DS) 800-160 MG tablet Take 1 tablet by mouth 3 (three) times a week. 12 tablet 2   benzonatate (TESSALON) 100 MG capsule Take 100 mg by mouth 3 (three) times daily as needed for cough. (Patient not taking: Reported on 09/06/2022)     celecoxib (CELEBREX) 200 MG capsule Take 200 mg by mouth 2 (two) times daily. (Patient not taking: Reported on 09/06/2022)     oxyCODONE (ROXICODONE) 5 MG immediate release tablet Take 1 tablet (5 mg total) by mouth every 6 (six) hours as needed for severe pain. (Patient not taking: Reported on 09/06/2022) 7 tablet 0   Facility-Administered Medications Prior to Visit  Medication Dose Route Frequency Provider Last Rate Last Admin   sodium chloride flush (NS) 0.9 % injection 10 mL  10 mL Intravenous PRN Si Gaul, MD   10 mL at 01/25/22 1534   Review of Systems  Constitutional:  Negative for chills, fever, malaise/fatigue and weight loss.  HENT:  Negative for congestion, sinus pain and sore throat.   Eyes: Negative.   Respiratory:  Negative for cough, hemoptysis, sputum production, shortness of breath and  wheezing.   Cardiovascular:  Negative for chest pain, palpitations, orthopnea, claudication and leg swelling.  Gastrointestinal:  Negative for abdominal pain, heartburn, nausea and vomiting.  Genitourinary: Negative.   Musculoskeletal:  Negative for joint pain and myalgias.  Skin:  Negative for rash.  Neurological:  Negative for dizziness and weakness.  Endo/Heme/Allergies: Negative.    Objective:   Vitals:   09/06/22 0930  BP: 104/68  Pulse: 84  Temp: (!) 97.1 F (36.2 C)  TempSrc: Temporal  SpO2: 97%  Weight: 131 lb 6.4 oz (59.6 kg)  Height: 5\' 2"  (1.575 m)    Physical Exam Constitutional:      General: She is not in acute distress.    Appearance: She is not ill-appearing.  HENT:     Head: Normocephalic and atraumatic.  Eyes:     General: No scleral icterus.    Conjunctiva/sclera: Conjunctivae normal.  Cardiovascular:     Rate and Rhythm: Normal rate and regular rhythm.     Pulses: Normal pulses.     Heart sounds: Normal heart sounds. No murmur heard. Pulmonary:     Effort: Pulmonary effort is normal.     Breath sounds: No wheezing, rhonchi or rales.  Musculoskeletal:     Right lower leg: No edema.     Left lower leg: No edema.  Skin:    General: Skin is warm and dry.  Neurological:     General: No focal deficit present.     Mental Status: She is alert.    CBC    Component Value Date/Time   WBC 13.6 (H) 09/12/2022 1115   WBC 6.9 01/14/2022 2220   RBC 3.76 (L) 09/12/2022 1115   HGB 12.0 09/12/2022 1115   HCT 36.7 09/12/2022 1115  PLT 257 09/12/2022 1115   MCV 97.6 09/12/2022 1115   MCH 31.9 09/12/2022 1115   MCHC 32.7 09/12/2022 1115   RDW 15.2 09/12/2022 1115   LYMPHSABS 0.6 (L) 09/12/2022 1115   MONOABS 0.4 09/12/2022 1115   EOSABS 0.0 09/12/2022 1115   BASOSABS 0.0 09/12/2022 1115      Latest Ref Rng & Units 09/12/2022   11:15 AM 06/15/2022   11:13 AM 06/06/2022    2:12 PM  BMP  Glucose 70 - 99 mg/dL 409  811  914   BUN 8 - 23 mg/dL 25  18   23    Creatinine 0.44 - 1.00 mg/dL 7.82  9.56  2.13   Sodium 135 - 145 mmol/L 139  136  138   Potassium 3.5 - 5.1 mmol/L 3.7  3.4  3.0   Chloride 98 - 111 mmol/L 103  99  98   CO2 22 - 32 mmol/L 27  29  29    Calcium 8.9 - 10.3 mg/dL 9.0  8.7  8.6     Chest imaging: CT Chest 06/07/22 Mediastinum/Nodes: No supraclavicular adenopathy. Right axillary node dissection. No mediastinal or hilar adenopathy. Tiny hiatal hernia.   Lungs/Pleura: Tiny right pleural effusion is slightly decreased. Persistent areas of mild loculation posteromedially on 69/2 and anteriorly on 93/2.   Mild centrilobular emphysema.   Status post right lower lobectomy.   Calcified granuloma along the right minor fissure.   Evolution and progression of geographic airspace and ground-glass opacity within the medial right upper and right middle lobes, favored to be radiation induced. For example, there are new areas of anterior ground-glass and septal thickening including on 48/7. Progressive posterior right upper lobe consolidation with air bronchograms including on 51/7, at the site of ground-glass and nodular consolidation on the prior. New posterolateral right apical nodular consolidation including on 31/7. No convincing findings of pulmonary recurrent or metastatic disease.  CT Chest 05/03/22 Mediastinum/Nodes: No mediastinal lymphadenopathy. There is no hilar lymphadenopathy. The esophagus has normal imaging features. There is no axillary lymphadenopathy.   Lungs/Pleura: Volume loss in the right hemithorax is compatible with right lower lobectomy. There is new interlobular septal thickening with a moderate sized patchy ground-glass opacity in the posterior right upper lung (see image 42/7). Along the medial aspect of this finding is a 1.8 x 2.0 cm irregular confluent nodular opacity.   Just inferior to this moderate area of ground-glass opacity is another 3.0 x 2.8 cm nodular consolidative lesion (59/7).    A third new nodular consolidative focus is seen in the anterior right lung on image 66/7 measuring 2.1 x 1.1 cm.   Fissural thickening noted in the area of fissural nodularity noted in the right lung previously. Tiny right pleural effusion is new in the interval with loculated anterior and paraspinal components.   Tiny peripheral left lower lobe nodule on 105/7 is unchanged. No suspicious pulmonary nodule or mass in the left lung.  CT Chest 01/31/22 1. Previously demonstrated perifissural and subpleural nodularity posteriorly in the right hemithorax has not progressed and may be slightly improved compared with the prior CT. Continued follow-up recommended. 2. Previously demonstrated mildly hypermetabolic right paratracheal lymph node has decreased in size. 3. No evidence of local recurrence or progressive metastatic disease. 4.  Aortic Atherosclerosis  PFT:    Latest Ref Rng & Units 07/25/2018    9:21 AM  PFT Results  FVC-Pre L 2.54   FVC-Predicted Pre % 98   FVC-Post L 2.64   FVC-Predicted Post %  102   Pre FEV1/FVC % % 78   Post FEV1/FCV % % 75   FEV1-Pre L 1.97   FEV1-Predicted Pre % 102   FEV1-Post L 1.98   DLCO uncorrected ml/min/mmHg 15.20   DLCO UNC% % 83   DLCO corrected ml/min/mmHg 15.44   DLCO COR %Predicted % 84   DLVA Predicted % 91   TLC L 5.19   TLC % Predicted % 105   RV % Predicted % 115     Labs:  Path:  Echo: LV EF 60-65%. RV systolic size and function is normal. Mild elevated PASP . LA mildly dilated. Moderate mitral valve regurgitation.   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 Other differential includes radiation induced pneumonitis. Less likely infectious at this time.   She was started on 40mg  prednisone 6/5, tapered to 30mg  daily on 6/28 then to 20mg  daily 7/28. We will continue prednisone taper as follows:  8/14 - 8/28 take 20mg  daily 8/29 - 9/12 take 10mg  daily 9/13 - 9/27 take 5mg  daily 9/28 - 10/5 take 2.5mg  daily Then stop  She is to take bactrim DS 1 tab 3 days per week until the end of August.   She can continue to use nebulizer treatments as needed.  Follow up in 3 months.  Melody Comas, MD [facility] Office: [phone]   Current Outpatient Medications:    acetaminophen (TYLENOL) 500 MG tablet, Take 1 tablet (500 mg total) by mouth every 6 (six) hours as needed for mild pain. (Patient taking differently: Take 1,000 mg by mouth every 6 (six) hours as needed for mild pain.), Disp: 30 tablet, Rfl: 0   aspirin EC 81 MG tablet, Take 1 tablet (81 mg total) by mouth daily. Swallow whole., Disp: 90 tablet, Rfl: 3   azelastine (ASTELIN) 0.1 % nasal spray, Place 1 spray into both nostrils daily as needed (Congestion). Use in each nostril as directed, Disp: , Rfl:    cetirizine (ZYRTEC) 10 MG tablet, Take 10 mg by mouth daily as needed for allergies., Disp: , Rfl:    diphenoxylate-atropine (LOMOTIL) 2.5-0.025 MG tablet, Take 1 tablet by mouth. As directed, Disp: , Rfl:    ezetimibe (ZETIA) 10 MG tablet, Take 10 mg by mouth daily., Disp: , Rfl:    hydrochlorothiazide (MICROZIDE) 12.5 MG capsule, Take 1 capsule (12.5 mg total) by mouth daily., Disp: 45 capsule, Rfl: 3   ipratropium-albuterol (DUONEB) 0.5-2.5 (3) MG/3ML SOLN, Take 3  mLs by nebulization every 6 (six) hours as needed., Disp: 360 mL, Rfl: 6   lidocaine-prilocaine (EMLA) cream, Apply 1 Application topically as needed., Disp: 30 g, Rfl: 2   LORazepam (ATIVAN) 1 MG tablet, Take 1-2 mg by mouth at bedtime as needed for sleep., Disp: , Rfl:    losartan (COZAAR) 25 MG tablet, Take 25 mg by mouth daily., Disp: , Rfl:    methimazole (TAPAZOLE) 5 MG tablet, Take 5  mg by mouth daily., Disp: , Rfl:    Polyethyl Glycol-Propyl Glycol (SYSTANE OP), Place 1 drop into both eyes 2 (two) times daily as needed (dry eyes)., Disp: , Rfl:    prednisoLONE acetate (PRED FORTE) 1 % ophthalmic suspension, 1 drop 2 (two) times daily., Disp: , Rfl:    predniSONE (DELTASONE) 10 MG tablet, Take 1 tablet (10 mg total) by mouth daily with breakfast., Disp: 28 tablet, Rfl: 0   predniSONE (DELTASONE) 20 MG tablet, Take 1 tablet (20 mg total) by mouth daily with breakfast., Disp: 30 tablet, Rfl: 0   predniSONE (DELTASONE) 5 MG tablet, Take 1 tablet (5 mg total) by mouth daily with breakfast., Disp: 20 tablet, Rfl: 0   senna-docusate (SENOKOT-S) 8.6-50 MG tablet, Take 1 tablet by mouth daily., Disp: 30 tablet, Rfl: 0   benzonatate (TESSALON) 100 MG capsule, Take 100 mg by mouth 3 (three) times daily as needed for cough. (Patient not taking: Reported on 09/06/2022), Disp: , Rfl:    celecoxib (CELEBREX) 200 MG capsule, Take 200 mg by mouth 2 (two) times daily. (Patient not taking: Reported on 09/06/2022), Disp: , Rfl:    oxyCODONE (ROXICODONE) 5 MG immediate release tablet, Take 1 tablet (5 mg total) by mouth every 6 (six) hours as needed for severe pain. (Patient not taking: Reported on 09/06/2022), Disp: 7 tablet, Rfl: 0   sulfamethoxazole-trimethoprim (BACTRIM DS) 800-160 MG tablet, Take 1 tablet by mouth 3 (three) times a week., Disp: 12 tablet, Rfl: 0 No current facility-administered medications for this visit.  Facility-Administered Medications Ordered in Other Visits:    sodium chloride flush  (NS) 0.9 % injection 10 mL, 10 mL, Intravenous, PRN, Si Gaul, MD, 10 mL at 01/25/22 1534

## 2022-09-06 NOTE — Patient Instructions (Addendum)
Prednisone taper Schedule: 8/14 - 8/28 take 20mg  daily 8/29 - 9/12 take 10mg  daily 9/13 - 9/27 take 5mg  daily 9/28 - 10/5 take 2.5mg  daily Then stop  Continue to take bactrim DS 1 tab 3 days per week until the end of August then stop  Continue duoneb nebulizer treatments as needed  Follow up in 3 months

## 2022-09-09 ENCOUNTER — Encounter: Payer: Self-pay | Admitting: Internal Medicine

## 2022-09-12 ENCOUNTER — Ambulatory Visit (HOSPITAL_COMMUNITY)
Admission: RE | Admit: 2022-09-12 | Discharge: 2022-09-12 | Disposition: A | Discharge status: Home / Self Care | Payer: Medicare PPO | Source: Ambulatory Visit | Attending: Internal Medicine | Admitting: Internal Medicine

## 2022-09-12 ENCOUNTER — Other Ambulatory Visit: Payer: Medicare PPO

## 2022-09-12 ENCOUNTER — Inpatient Hospital Stay: Payer: Medicare PPO | Attending: Internal Medicine

## 2022-09-12 DIAGNOSIS — Z95828 Presence of other vascular implants and grafts: Secondary | ICD-10-CM

## 2022-09-12 DIAGNOSIS — I700 Atherosclerosis of aorta: Secondary | ICD-10-CM | POA: Diagnosis not present

## 2022-09-12 DIAGNOSIS — Z923 Personal history of irradiation: Secondary | ICD-10-CM | POA: Insufficient documentation

## 2022-09-12 DIAGNOSIS — Z9221 Personal history of antineoplastic chemotherapy: Secondary | ICD-10-CM | POA: Insufficient documentation

## 2022-09-12 DIAGNOSIS — C3491 Malignant neoplasm of unspecified part of right bronchus or lung: Secondary | ICD-10-CM

## 2022-09-12 DIAGNOSIS — J90 Pleural effusion, not elsewhere classified: Secondary | ICD-10-CM | POA: Diagnosis not present

## 2022-09-12 DIAGNOSIS — Z902 Acquired absence of lung [part of]: Secondary | ICD-10-CM | POA: Insufficient documentation

## 2022-09-12 DIAGNOSIS — C3490 Malignant neoplasm of unspecified part of unspecified bronchus or lung: Secondary | ICD-10-CM | POA: Insufficient documentation

## 2022-09-12 DIAGNOSIS — J432 Centrilobular emphysema: Secondary | ICD-10-CM | POA: Diagnosis not present

## 2022-09-12 DIAGNOSIS — C3431 Malignant neoplasm of lower lobe, right bronchus or lung: Secondary | ICD-10-CM | POA: Insufficient documentation

## 2022-09-12 LAB — CMP (CANCER CENTER ONLY)
ALT: 15 U/L (ref 0–44)
AST: 11 U/L — ABNORMAL LOW (ref 15–41)
Albumin: 3.8 g/dL (ref 3.5–5.0)
Alkaline Phosphatase: 38 U/L (ref 38–126)
Anion gap: 9 (ref 5–15)
BUN: 25 mg/dL — ABNORMAL HIGH (ref 8–23)
CO2: 27 mmol/L (ref 22–32)
Calcium: 9 mg/dL (ref 8.9–10.3)
Chloride: 103 mmol/L (ref 98–111)
Creatinine: 1.08 mg/dL — ABNORMAL HIGH (ref 0.44–1.00)
GFR, Estimated: 51 mL/min — ABNORMAL LOW (ref >60)
Glucose, Bld: 143 mg/dL — ABNORMAL HIGH (ref 70–99)
Potassium: 3.7 mmol/L (ref 3.5–5.1)
Sodium: 139 mmol/L (ref 135–145)
Total Bilirubin: 0.8 mg/dL (ref 0.3–1.2)
Total Protein: 6.3 g/dL — ABNORMAL LOW (ref 6.5–8.1)

## 2022-09-12 LAB — CBC WITH DIFFERENTIAL (CANCER CENTER ONLY)
Abs Immature Granulocytes: 0.09 10*3/uL — ABNORMAL HIGH (ref 0.00–0.07)
Basophils Absolute: 0 10*3/uL (ref 0.0–0.1)
Basophils Relative: 0 %
Eosinophils Absolute: 0 10*3/uL (ref 0.0–0.5)
Eosinophils Relative: 0 %
HCT: 36.7 % (ref 36.0–46.0)
Hemoglobin: 12 g/dL (ref 12.0–15.0)
Immature Granulocytes: 1 %
Lymphocytes Relative: 4 %
Lymphs Abs: 0.6 10*3/uL — ABNORMAL LOW (ref 0.7–4.0)
MCH: 31.9 pg (ref 26.0–34.0)
MCHC: 32.7 g/dL (ref 30.0–36.0)
MCV: 97.6 fL (ref 80.0–100.0)
Monocytes Absolute: 0.4 10*3/uL (ref 0.1–1.0)
Monocytes Relative: 3 %
Neutro Abs: 12.5 10*3/uL — ABNORMAL HIGH (ref 1.7–7.7)
Neutrophils Relative %: 92 %
Platelet Count: 257 10*3/uL (ref 150–400)
RBC: 3.76 MIL/uL — ABNORMAL LOW (ref 3.87–5.11)
RDW: 15.2 % (ref 11.5–15.5)
WBC Count: 13.6 10*3/uL — ABNORMAL HIGH (ref 4.0–10.5)
nRBC: 0 % (ref 0.0–0.2)

## 2022-09-12 MED ORDER — HEPARIN SOD (PORK) LOCK FLUSH 100 UNIT/ML IV SOLN
500.0000 [IU] | Freq: Once | INTRAVENOUS | Status: AC
  Administered 2022-09-12: 500 [IU] via INTRAVENOUS

## 2022-09-12 MED ORDER — HEPARIN SOD (PORK) LOCK FLUSH 100 UNIT/ML IV SOLN
INTRAVENOUS | Status: AC
  Filled 2022-09-12: qty 5

## 2022-09-12 MED ORDER — IOHEXOL 300 MG/ML  SOLN
75.0000 mL | Freq: Once | INTRAMUSCULAR | Status: AC | PRN
  Administered 2022-09-12: 75 mL via INTRAVENOUS

## 2022-09-12 MED ORDER — SODIUM CHLORIDE 0.9% FLUSH
10.0000 mL | Freq: Once | INTRAVENOUS | Status: AC
  Administered 2022-09-12: 10 mL

## 2022-09-13 DIAGNOSIS — H04123 Dry eye syndrome of bilateral lacrimal glands: Secondary | ICD-10-CM | POA: Diagnosis not present

## 2022-09-14 ENCOUNTER — Inpatient Hospital Stay (HOSPITAL_BASED_OUTPATIENT_CLINIC_OR_DEPARTMENT_OTHER): Payer: Medicare PPO | Admitting: Internal Medicine

## 2022-09-14 ENCOUNTER — Encounter: Payer: Self-pay | Admitting: Pulmonary Disease

## 2022-09-14 VITALS — BP 129/65 | HR 91 | Temp 98.3°F | Resp 17 | Ht 62.0 in | Wt 134.0 lb

## 2022-09-14 DIAGNOSIS — Z902 Acquired absence of lung [part of]: Secondary | ICD-10-CM | POA: Diagnosis not present

## 2022-09-14 DIAGNOSIS — C3490 Malignant neoplasm of unspecified part of unspecified bronchus or lung: Secondary | ICD-10-CM | POA: Diagnosis not present

## 2022-09-14 DIAGNOSIS — Z9221 Personal history of antineoplastic chemotherapy: Secondary | ICD-10-CM | POA: Diagnosis not present

## 2022-09-14 DIAGNOSIS — Z923 Personal history of irradiation: Secondary | ICD-10-CM | POA: Diagnosis not present

## 2022-09-14 DIAGNOSIS — C3431 Malignant neoplasm of lower lobe, right bronchus or lung: Secondary | ICD-10-CM | POA: Diagnosis not present

## 2022-09-14 NOTE — Progress Notes (Signed)
[facility] [phone]   [phone]  OFFICE PROGRESS NOTE  Lupita Raider, MD [address] [place] [address]  DIAGNOSIS: Recurrent non-small cell lung cancer presented as a stage IIIa (T1c, N2, M0) non-small cell lung cancer, adenocarcinoma initially diagnosed as T1a (T1c, N0, M0) non-small cell lung cancer, adenocarcinoma presented with right lower lobe lung nodule in June 2020.  PRIOR THERAPY:  1) Right video-assisted thoracoscopy, Wedge resection right lower lobe nodule, Thoracoscopic right lower lobectomy, Lymph node dissection on July 26, 2018 under the care of Dr. Dorris Fetch. 2) Concurrent chemoradiation with weekly carboplatin for AUC of 2 and paclitaxel 45 Mg/M2.  First dose November 22, 2021.  Status post 6 cycles. 3) Consolidation treatment with immunotherapy with Imfinzi 1500 Mg IV every 4 weeks.  First dose February 09, 2022.  Status post 3 cycles.  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  CURRENT THERAPY: Observation.   INTERVAL HISTORY: Vanessa Day 82 y.o. female returns to the clinic today for follow-up visit accompanied by her husband.  The patient is feeling fine today with no concerning complaints except for the cushingoid features from long-term treatment with prednisone.  She denied having any current chest pain, cough but continues to have shortness of breath at baseline increased with exertion.  She has no hemoptysis.  She has no nausea, vomiting, diarrhea or constipation.  She has no headache or visual changes.  She is currently on a tapered dose of prednisone that has been going on now for several weeks and currently on 20 mg p.o. daily followed by Dr. Irena Cords.  She had repeat CT scan of the chest performed recently and she is here for evaluation and discussion of her risk her results.  MEDICAL HISTORY: Past Medical History:  Diagnosis Date   Adenocarcinoma of  lung, stage 1, right (HCC) 2020   Anxiety    Arthritis    back, feet, jaw - per patient "all over"   Breast cancer (HCC) 2000   Right breast   Family history of breast cancer    GERD (gastroesophageal reflux disease)    Graves disease    H/O hiatal hernia    Hyperlipidemia    Hypertension    IBS (irritable bowel syndrome)    Mitral regurgitation    moderate mitral regurgitation 08/20/20 echo   Neck pain    PAF (paroxysmal atrial fibrillation) (HCC)    ~ 09/2020 in setting of hyperthyroidism   PONV (postoperative nausea and vomiting)    Pre-diabetes     ALLERGIES:  is allergic to hydrocodone, amitriptyline hcl, angiotensin receptor blockers, codeine, dicyclomine hcl, hyoscyamine, nitrofurantoin, statins, tramadol, oxycodone-acetaminophen, pseudoephedrine, and sulfa antibiotics.  MEDICATIONS:  Current Outpatient Medications  Medication Sig Dispense Refill   acetaminophen (TYLENOL) 500 MG tablet Take 1 tablet (500 mg total) by mouth every 6 (six) hours as needed for mild pain. (Patient taking differently: Take 1,000 mg by mouth every 6 (six) hours as needed for mild pain.) 30 tablet 0   aspirin EC 81 MG tablet Take 1 tablet (81 mg total) by mouth daily. Swallow whole. 90 tablet 3   azelastine (ASTELIN) 0.1 % nasal spray Place 1 spray into both nostrils daily as needed (Congestion). Use in each nostril as directed     benzonatate (TESSALON) 100 MG capsule Take 100 mg by mouth 3 (three) times daily as needed for cough. (Patient not taking: Reported on 09/06/2022)     celecoxib (  CELEBREX) 200 MG capsule Take 200 mg by mouth 2 (two) times daily. (Patient not taking: Reported on 09/06/2022)     cetirizine (ZYRTEC) 10 MG tablet Take 10 mg by mouth daily as needed for allergies.     diphenoxylate-atropine (LOMOTIL) 2.5-0.025 MG tablet Take 1 tablet by mouth. As directed     ezetimibe (ZETIA) 10 MG tablet Take 10 mg by mouth daily.     hydrochlorothiazide (MICROZIDE) 12.5 MG capsule Take 1  capsule (12.5 mg total) by mouth daily. 45 capsule 3   ipratropium-albuterol (DUONEB) 0.5-2.5 (3) MG/3ML SOLN Take 3 mLs by nebulization every 6 (six) hours as needed. 360 mL 6   lidocaine-prilocaine (EMLA) cream Apply 1 Application topically as needed. 30 g 2   LORazepam (ATIVAN) 1 MG tablet Take 1-2 mg by mouth at bedtime as needed for sleep.     losartan (COZAAR) 25 MG tablet Take 25 mg by mouth daily.     methimazole (TAPAZOLE) 5 MG tablet Take 5 mg by mouth daily.     oxyCODONE (ROXICODONE) 5 MG immediate release tablet Take 1 tablet (5 mg total) by mouth every 6 (six) hours as needed for severe pain. (Patient not taking: Reported on 09/06/2022) 7 tablet 0   Polyethyl Glycol-Propyl Glycol (SYSTANE OP) Place 1 drop into both eyes 2 (two) times daily as needed (dry eyes).     prednisoLONE acetate (PRED FORTE) 1 % ophthalmic suspension 1 drop 2 (two) times daily.     predniSONE (DELTASONE) 10 MG tablet Take 1 tablet (10 mg total) by mouth daily with breakfast. 28 tablet 0   predniSONE (DELTASONE) 20 MG tablet Take 1 tablet (20 mg total) by mouth daily with breakfast. 30 tablet 0   predniSONE (DELTASONE) 5 MG tablet Take 1 tablet (5 mg total) by mouth daily with breakfast. 20 tablet 0   senna-docusate (SENOKOT-S) 8.6-50 MG tablet Take 1 tablet by mouth daily. 30 tablet 0   sulfamethoxazole-trimethoprim (BACTRIM DS) 800-160 MG tablet Take 1 tablet by mouth 3 (three) times a week. 12 tablet 0   No current facility-administered medications for this visit.   Facility-Administered Medications Ordered in Other Visits  Medication Dose Route Frequency Provider Last Rate Last Admin   sodium chloride flush (NS) 0.9 % injection 10 mL  10 mL Intravenous PRN Si Gaul, MD   10 mL at 01/25/22 1534    SURGICAL HISTORY:  Past Surgical History:  Procedure Laterality Date   ABDOMINAL HYSTERECTOMY     APPENDECTOMY     BACK SURGERY     BREAST BIOPSY     BREAST SURGERY     CARPOMETACARPEL SUSPENSION  PLASTY Left 08/25/2015   Procedure: SUSPENSIONPLASTY LEFT THUMB TRAPEZIUM EXCISION ABDUCTOR POLLICIS LONGUS TRANSFER;  Surgeon: Cindee Salt, MD;  Location: [facility];  Service: Orthopedics;  Laterality: Left;   CHOLECYSTECTOMY     COLONOSCOPY     several   EYE SURGERY     cataract right and left eye   GAS INSERTION  06/23/2011   Procedure: INSERTION OF GAS;  Surgeon: Sherrie George, MD;  Location: [facility] OR;  Service: Ophthalmology;  Laterality: Left;  SF6   IR IMAGING GUIDED PORT INSERTION  12/14/2021   LOBECTOMY Right 07/26/2018   Procedure: RIGHT LOWER LOBE LOBECTOMY;  Surgeon: Loreli Slot, MD;  Location[facility] OR;  Service: Thoracic;  Laterality: Right;   MASTECTOMY     right breast   SCLERAL BUCKLE  06/23/2011   Procedure: SCLERAL BUCKLE;  Surgeon:  Sherrie George, MD;  Location: [facility] OR;  Service: Ophthalmology;  Laterality: Left;   SEGMENTECOMY Right 07/26/2018   Procedure: SEGMENTECTOMY;  Surgeon: Loreli Slot, MD;  Location: [facility] OR;  Service: Thoracic;  Laterality: Right;   TONSILLECTOMY     VIDEO ASSISTED THORACOSCOPY Right 07/26/2018   Procedure: VIDEO ASSISTED THORACOSCOPY;  Surgeon: Loreli Slot, MD;  Location: [facility] OR;  Service: Thoracic;  Laterality: Right;   VIDEO BRONCHOSCOPY WITH ENDOBRONCHIAL ULTRASOUND N/A 10/27/2021   Procedure: VIDEO BRONCHOSCOPY WITH ENDOBRONCHIAL ULTRASOUND;  Surgeon: Loreli Slot, MD;  Location: [facility] OR;  Service: Thoracic;  Laterality: N/A;    REVIEW OF SYSTEMS:  Constitutional: negative Eyes: negative Ears, nose, mouth, throat, and face: negative Respiratory: positive for dyspnea on exertion Cardiovascular: negative Gastrointestinal: negative Genitourinary:negative Integument/breast: negative Hematologic/lymphatic: negative Musculoskeletal:negative Neurological: negative Behavioral/Psych: negative Endocrine: negative Allergic/Immunologic: negative   PHYSICAL EXAMINATION: General appearance:  alert, cooperative, and no distress Head: Normocephalic, without obvious abnormality, atraumatic Neck: no adenopathy, no JVD, supple, symmetrical, trachea midline, and thyroid not enlarged, symmetric, no tenderness/mass/nodules Lymph nodes: Cervical, supraclavicular, and axillary nodes normal. Resp: clear to auscultation bilaterally Back: symmetric, no curvature. ROM normal. No CVA tenderness. Cardio: regular rate and rhythm, S1, S2 normal, no murmur, click, rub or gallop GI: soft, non-tender; bowel sounds normal; no masses,  no organomegaly Extremities: extremities normal, atraumatic, no cyanosis or edema Neurologic: Alert and oriented X 3, normal strength and tone. Normal symmetric reflexes. Normal coordination and gait  ECOG PERFORMANCE STATUS: 1 - Symptomatic but completely ambulatory   Blood pressure 129/65, pulse 91, temperature 98.3 F (36.8 C), temperature source Oral, resp. rate 17, height 5\' 2"  (1.575 m), weight 134 lb (60.8 kg), SpO2 100%.  LABORATORY DATA: Lab Results  Component Value Date   WBC 13.6 (H) 09/12/2022   HGB 12.0 09/12/2022   HCT 36.7 09/12/2022   MCV 97.6 09/12/2022   PLT 257 09/12/2022      Chemistry      Component Value Date/Time   NA 139 09/12/2022 1115   K 3.7 09/12/2022 1115   CL 103 09/12/2022 1115   CO2 27 09/12/2022 1115   BUN 25 (H) 09/12/2022 1115   CREATININE 1.08 (H) 09/12/2022 1115      Component Value Date/Time   CALCIUM 9.0 09/12/2022 1115   ALKPHOS 38 09/12/2022 1115   AST 11 (L) 09/12/2022 1115   ALT 15 09/12/2022 1115   BILITOT 0.8 09/12/2022 1115       RADIOGRAPHIC STUDIES: No results found.   ASSESSMENT AND PLAN: This is a very pleasant 82 years old white female with stage IIIA (T1c, N2, M0) non-small cell lung cancer, adenocarcinoma initially diagnosed as diagnosed with a stage IA (T1c, N0, M0) non-small cell lung cancer, adenocarcinoma status post right lower lobectomy with lymph node dissection in July 2020.  She had  disease recurrence in October 2023. The patient is currently on observation and she is feeling fine except for the dry cough. Her CT scan of the chest followed by PET scan showed increasing nodularity of the pleural surface in the right chest with ill-defined areas in the cardiophrenic recess and increasing size of lymph nodes in the mediastinum. The patient underwent bronchoscopy with EBUS under the care of Dr. 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 The patient started a course of concurrent chemoradiation with weekly carboplatin for AUC of 2 and paclitaxel 45 Mg/M2 status post 7 cycles.  She tolerated this  treatment well except for the radiation-induced esophagitis as well as fatigue. Her scan showed improvement of her disease especially in the mediastinal lymph nodes. The patient underwent consolidation treatment with immunotherapy with Imfinzi 1500 Mg IV every 4 weeks status post 3 cycles.  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  The patient was treated with a tapered dose of prednisone as well as 2 weeks course of doxycycline with no improvement in her symptoms.  She is now on a long-term treatment with prednisone and currently on 20 mg p.o. daily by her pulmonologist. The patient had repeat CT scan of the chest performed recently.  Unfortunately the final report is still pending but I personally and independently reviewed the scan images and discussed the result with the patient and her husband.  She continues to have airspace disease in the right lung suspicious for evolving radiation changes but underlying mass could not be completely excluded and I will wait for the final report for confirmation. I recommended for the patient to continue on observation for now with repeat CT scan of the chest in 4 months. For the radiation/immunotherapy mediated pneumonitis, she is currently on a tapered dose  of prednisone and this is managed by her pulmonologist. The patient was advised to call immediately if she has any other concerning symptoms in the interval.  The patient voices understanding of current disease status and treatment options and is in agreement with the current care plan. All questions were answered. The patient knows to call the clinic with any problems, questions or concerns. We can certainly see the patient much sooner if necessary. The total time spent in the appointment was 35 minutes.  Disclaimer: This note was dictated with voice recognition software. Similar sounding words can inadvertently be transcribed and may not be corrected upon review.

## 2022-09-18 ENCOUNTER — Other Ambulatory Visit: Payer: Self-pay | Admitting: Pulmonary Disease

## 2022-09-18 DIAGNOSIS — T50905A Adverse effect of unspecified drugs, medicaments and biological substances, initial encounter: Secondary | ICD-10-CM

## 2022-09-20 DIAGNOSIS — D84821 Immunodeficiency due to drugs: Secondary | ICD-10-CM | POA: Diagnosis not present

## 2022-09-20 DIAGNOSIS — I10 Essential (primary) hypertension: Secondary | ICD-10-CM | POA: Diagnosis not present

## 2022-09-20 DIAGNOSIS — C3491 Malignant neoplasm of unspecified part of right bronchus or lung: Secondary | ICD-10-CM | POA: Diagnosis not present

## 2022-09-20 DIAGNOSIS — E0590 Thyrotoxicosis, unspecified without thyrotoxic crisis or storm: Secondary | ICD-10-CM | POA: Diagnosis not present

## 2022-09-20 DIAGNOSIS — M4800 Spinal stenosis, site unspecified: Secondary | ICD-10-CM | POA: Diagnosis not present

## 2022-09-20 DIAGNOSIS — J8489 Other specified interstitial pulmonary diseases: Secondary | ICD-10-CM | POA: Diagnosis not present

## 2022-09-20 DIAGNOSIS — N1831 Chronic kidney disease, stage 3a: Secondary | ICD-10-CM | POA: Diagnosis not present

## 2022-09-20 DIAGNOSIS — E538 Deficiency of other specified B group vitamins: Secondary | ICD-10-CM | POA: Diagnosis not present

## 2022-09-20 DIAGNOSIS — E1122 Type 2 diabetes mellitus with diabetic chronic kidney disease: Secondary | ICD-10-CM | POA: Diagnosis not present

## 2022-09-29 DIAGNOSIS — E0590 Thyrotoxicosis, unspecified without thyrotoxic crisis or storm: Secondary | ICD-10-CM | POA: Diagnosis not present

## 2022-10-02 ENCOUNTER — Other Ambulatory Visit: Payer: Self-pay | Admitting: Pulmonary Disease

## 2022-10-02 DIAGNOSIS — J984 Other disorders of lung: Secondary | ICD-10-CM

## 2022-10-03 DIAGNOSIS — M5416 Radiculopathy, lumbar region: Secondary | ICD-10-CM | POA: Diagnosis not present

## 2022-10-04 DIAGNOSIS — J702 Acute drug-induced interstitial lung disorders: Secondary | ICD-10-CM | POA: Diagnosis not present

## 2022-10-11 ENCOUNTER — Encounter: Payer: Self-pay | Admitting: Pulmonary Disease

## 2022-10-25 DIAGNOSIS — M5416 Radiculopathy, lumbar region: Secondary | ICD-10-CM | POA: Diagnosis not present

## 2022-10-26 ENCOUNTER — Inpatient Hospital Stay: Payer: Medicare PPO | Attending: Internal Medicine

## 2022-10-26 DIAGNOSIS — C3431 Malignant neoplasm of lower lobe, right bronchus or lung: Secondary | ICD-10-CM | POA: Diagnosis not present

## 2022-10-26 DIAGNOSIS — Z95828 Presence of other vascular implants and grafts: Secondary | ICD-10-CM

## 2022-10-26 DIAGNOSIS — Z452 Encounter for adjustment and management of vascular access device: Secondary | ICD-10-CM | POA: Diagnosis not present

## 2022-10-26 DIAGNOSIS — C3491 Malignant neoplasm of unspecified part of right bronchus or lung: Secondary | ICD-10-CM

## 2022-10-26 MED ORDER — HEPARIN SOD (PORK) LOCK FLUSH 100 UNIT/ML IV SOLN
500.0000 [IU] | Freq: Once | INTRAVENOUS | Status: AC
  Administered 2022-10-26: 500 [IU]

## 2022-10-26 MED ORDER — SODIUM CHLORIDE 0.9% FLUSH
10.0000 mL | Freq: Once | INTRAVENOUS | Status: AC
  Administered 2022-10-26: 10 mL

## 2022-11-03 DIAGNOSIS — J702 Acute drug-induced interstitial lung disorders: Secondary | ICD-10-CM | POA: Diagnosis not present

## 2022-11-04 DIAGNOSIS — H04123 Dry eye syndrome of bilateral lacrimal glands: Secondary | ICD-10-CM | POA: Diagnosis not present

## 2022-11-08 DIAGNOSIS — H9113 Presbycusis, bilateral: Secondary | ICD-10-CM | POA: Diagnosis not present

## 2022-11-08 DIAGNOSIS — H903 Sensorineural hearing loss, bilateral: Secondary | ICD-10-CM | POA: Diagnosis not present

## 2022-11-08 DIAGNOSIS — R2689 Other abnormalities of gait and mobility: Secondary | ICD-10-CM | POA: Diagnosis not present

## 2022-12-04 DIAGNOSIS — J702 Acute drug-induced interstitial lung disorders: Secondary | ICD-10-CM | POA: Diagnosis not present

## 2022-12-05 DIAGNOSIS — R059 Cough, unspecified: Secondary | ICD-10-CM | POA: Diagnosis not present

## 2022-12-05 DIAGNOSIS — J329 Chronic sinusitis, unspecified: Secondary | ICD-10-CM | POA: Diagnosis not present

## 2022-12-05 DIAGNOSIS — J40 Bronchitis, not specified as acute or chronic: Secondary | ICD-10-CM | POA: Diagnosis not present

## 2022-12-07 ENCOUNTER — Inpatient Hospital Stay: Payer: Medicare PPO | Attending: Internal Medicine

## 2022-12-07 DIAGNOSIS — Z452 Encounter for adjustment and management of vascular access device: Secondary | ICD-10-CM | POA: Diagnosis not present

## 2022-12-07 DIAGNOSIS — C3431 Malignant neoplasm of lower lobe, right bronchus or lung: Secondary | ICD-10-CM | POA: Diagnosis not present

## 2022-12-07 DIAGNOSIS — Z95828 Presence of other vascular implants and grafts: Secondary | ICD-10-CM

## 2022-12-07 DIAGNOSIS — C3491 Malignant neoplasm of unspecified part of right bronchus or lung: Secondary | ICD-10-CM

## 2022-12-07 MED ORDER — SODIUM CHLORIDE 0.9% FLUSH
10.0000 mL | Freq: Once | INTRAVENOUS | Status: AC
  Administered 2022-12-07: 10 mL

## 2022-12-07 MED ORDER — HEPARIN SOD (PORK) LOCK FLUSH 100 UNIT/ML IV SOLN
500.0000 [IU] | Freq: Once | INTRAVENOUS | Status: AC
  Administered 2022-12-07: 500 [IU]

## 2022-12-12 ENCOUNTER — Encounter: Payer: Self-pay | Admitting: Pulmonary Disease

## 2022-12-12 ENCOUNTER — Ambulatory Visit: Payer: Medicare PPO | Admitting: Pulmonary Disease

## 2022-12-12 VITALS — BP 120/66 | HR 86 | Temp 98.0°F | Ht 62.0 in | Wt 134.0 lb

## 2022-12-12 DIAGNOSIS — J984 Other disorders of lung: Secondary | ICD-10-CM

## 2022-12-12 DIAGNOSIS — T50905A Adverse effect of unspecified drugs, medicaments and biological substances, initial encounter: Secondary | ICD-10-CM

## 2022-12-12 NOTE — Progress Notes (Signed)
Synopsis: Referred in June 2024 for abnormal CT Chest by Si Gaul, MD  Subjective:   PATIENT ID: Vanessa Day GENDER: female DOB: Apr 29, 1940, MRN: [id number]  HPI  Chief Complaint  Patient presents with   Follow-up    Increased cough past 2 wks- currently taking doxy. Cough is not very prod- sometimes clear to yellow sputum. She also has noticed some wheezing.    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  Initial OV 06/28/22 She was seen 06/15/22 by Dr. Arbutus Ped and was feeling terrible with fatigue and weakness. She was started on a medrol dosepak and referred to pulmonary clinic.   CT Chest 5/14 shows evolution and progression of geographic airspace and ground glass opacity within the medial right upper and right middle lobes, favored to be radiation induced. New areas of anterior ground glass and septal thickening, progressive posterior right upper lobe consolidation with air bronchograms, previously nodular consolidation on prior scan.   She continues to have cough that is dry with intermittent wheezing and significant exertional dyspnea. She has fatigue and lack of appetite. She has been losing weight. She denies fevers, chills or sweats.  She is going to the beach in 10 days with her family and is hoping to feel better by then.  OV - 07/22/22 She is feeling better from a respiratory standpoint. Dyspnea is better and denies cough. She complains of dizziness and blurry vision. She had blurry vision prior to starting steroid taper. She has been on 84m prednisone since 6/5. She is taking bactrim DS 1 tab 3 days per week.   OV 09/06/22 She has been feeling much better since last visit. She has not  noticed any increase in symptoms since tapering down to 20mg  daily from 40mg  at the start. She has continued bactrim 1 tab 3 days per week.  OV 12/12/22 The patient, with a history of radiation treatment and chemotherapy, presents with a persistent cough and wheezing that has been ongoing for over a week. The cough is described as hard and is worse at night, particularly when lying down. The patient has been on an antibiotic for a week, which was prescribed at a walk-in clinic. The patient reports some improvement, but the cough is still present. The patient also reports fatigue and shortness of breath with activity, stating she "gets tired".  In addition to the respiratory symptoms, the patient reports some hearing loss, which she attributes to previous chemotherapy treatment. The patient has been experiencing dizziness and issues with her eyes, which have been evaluated by an eye doctor. The patient also reports soreness under the right breast and ribs, which has been ongoing for months. The patient has a history of arthritis and back issues, and reports pain in the legs and feet.  Past Medical History:  Diagnosis Date   Adenocarcinoma of lung, stage 1, right (HCC) 2020   Anxiety    Arthritis    back, feet, jaw - per patient "all over"   Breast cancer [facility]) 2000   Right breast   Family history of breast cancer    GERD (gastroesophageal reflux disease)    Graves disease    H/O hiatal hernia    Hyperlipidemia    Hypertension    IBS (irritable  bowel syndrome)    Mitral regurgitation    moderate mitral regurgitation 08/20/20 echo   Neck pain    PAF (paroxysmal atrial fibrillation) (HCC)    ~ 09/2020 in setting of hyperthyroidism   PONV (postoperative nausea and vomiting)    Pre-diabetes      Family History  Problem Relation Age of Onset   Breast cancer Mother        dx over 52   Prostate cancer Father        dx late 60s-early 88s   COPD Father    Cerebral palsy Brother    Lung  cancer Maternal Uncle        smoker   Stroke Maternal Grandfather    Cancer Cousin        NOS   Breast cancer Daughter 35   Anesthesia problems Neg Hx    Hypotension Neg Hx    Malignant hyperthermia Neg Hx    Pseudochol deficiency Neg Hx      Social History   Socioeconomic History   Marital status: Married    Spouse name: Not on file   Number of children: Not on file   Years of education: Not on file   Highest education level: Not on file  Occupational History   Not on file  Tobacco Use   Smoking status: Former    Current packs/day: 0.00    Average packs/day: 1 pack/day for 30.0 years (30.0 ttl pk-yrs)    Types: Cigarettes    Start date: 06/21/1972    Quit date: 06/22/2002    Years since quitting: 20.4   Smokeless tobacco: Never  Vaping Use   Vaping status: Never Used  Substance and Sexual Activity   Alcohol use: Yes    Alcohol/week: 7.0 standard drinks of alcohol    Types: 7 Glasses of wine per week    Comment: small glass every night   Drug use: Yes    Types: Flunitrazepam   Sexual activity: Not Currently  Other Topics Concern   Not on file  Social History Narrative   Not on file   Social Determinants of Health   Financial Resource Strain: Not on file  Food Insecurity: No Food Insecurity (02/02/2022)   Hunger Vital Sign    Worried About Running Out of Food in the Last Year: Never true    Ran Out of Food in the Last Year: Never true  Transportation Needs: Not on file  Physical Activity: Not on file  Stress: Not on file  Social Connections: Not on file  Intimate Partner Violence: Not on file     Allergies  Allergen Reactions   Hydrocodone Itching   Amitriptyline Hcl Other (See Comments)   Angiotensin Receptor Blockers Cough   Codeine Itching   Dicyclomine Hcl     Other reaction(s): Unknown   Hyoscyamine     Other reaction(s): Unknown   Nitrofurantoin Other (See Comments)    Unknown   Statins Other (See Comments)    Muscle soreness   Tramadol  Hives   Oxycodone-Acetaminophen Anxiety   Pseudoephedrine Palpitations    Other reaction(s): Unknown   Sulfa Antibiotics Palpitations    Breakout around face ????     Outpatient Medications Prior to Visit  Medication Sig Dispense Refill   acetaminophen (TYLENOL) 500 MG tablet Take 1 tablet (500 mg total) by mouth every 6 (six) hours as needed for mild pain. (Patient taking differently: Take 1,000 mg by mouth every 6 (six) hours as needed for mild pain (  pain score 1-3).) 30 tablet 0   aspirin EC 81 MG tablet Take 1 tablet (81 mg total) by mouth daily. Swallow whole. 90 tablet 3   azelastine (ASTELIN) 0.1 % nasal spray Place 1 spray into both nostrils daily as needed (Congestion). Use in each nostril as directed     celecoxib (CELEBREX) 200 MG capsule Take 200 mg by mouth 2 (two) times daily.     cetirizine (ZYRTEC) 10 MG tablet Take 10 mg by mouth daily as needed for allergies.     diphenoxylate-atropine (LOMOTIL) 2.5-0.025 MG tablet Take 1 tablet by mouth. As directed     doxycycline (VIBRA-TABS) 100 MG tablet Take 100 mg by mouth 2 (two) times daily.     ezetimibe (ZETIA) 10 MG tablet Take 10 mg by mouth daily.     hydrochlorothiazide (MICROZIDE) 12.5 MG capsule Take 1 capsule (12.5 mg total) by mouth daily. 45 capsule 3   ipratropium-albuterol (DUONEB) 0.5-2.5 (3) MG/3ML SOLN Take 3 mLs by nebulization every 6 (six) hours as needed. 360 mL 6   lidocaine-prilocaine (EMLA) cream Apply 1 Application topically as needed. 30 g 2   LORazepam (ATIVAN) 1 MG tablet Take 1-2 mg by mouth at bedtime as needed for sleep.     losartan (COZAAR) 25 MG tablet Take 25 mg by mouth daily.     methimazole (TAPAZOLE) 5 MG tablet Take 5 mg by mouth daily.     oxyCODONE (ROXICODONE) 5 MG immediate release tablet Take 1 tablet (5 mg total) by mouth every 6 (six) hours as needed for severe pain. 7 tablet 0   Polyethyl Glycol-Propyl Glycol (SYSTANE OP) Place 1 drop into both eyes 2 (two) times daily as needed (dry  eyes).     benzonatate (TESSALON) 100 MG capsule Take 100 mg by mouth 3 (three) times daily as needed for cough. (Patient not taking: Reported on 09/06/2022)     prednisoLONE acetate (PRED FORTE) 1 % ophthalmic suspension 1 drop 2 (two) times daily.     predniSONE (DELTASONE) 10 MG tablet Take 1 tablet (10 mg total) by mouth daily with breakfast. 28 tablet 0   predniSONE (DELTASONE) 20 MG tablet Take 1 tablet (20 mg total) by mouth daily with breakfast. 30 tablet 0   predniSONE (DELTASONE) 5 MG tablet Take 1 tablet (5 mg total) by mouth daily with breakfast. 20 tablet 0   senna-docusate (SENOKOT-S) 8.6-50 MG tablet Take 1 tablet by mouth daily. 30 tablet 0   sulfamethoxazole-trimethoprim (BACTRIM DS) 800-160 MG tablet Take 1 tablet by mouth 3 (three) times a week. 12 tablet 0   Facility-Administered Medications Prior to Visit  Medication Dose Route Frequency Provider Last Rate Last Admin   sodium chloride flush (NS) 0.9 % injection 10 mL  10 mL Intravenous PRN Si Gaul, MD   10 mL at 01/25/22 1534   Review of Systems  Constitutional:  Negative for chills, fever, malaise/fatigue and weight loss.  HENT:  Negative for congestion, sinus pain and sore throat.   Eyes: Negative.   Respiratory:  Positive for cough. Negative for hemoptysis, sputum production, shortness of breath and wheezing.   Cardiovascular:  Negative for chest pain, palpitations, orthopnea, claudication and leg swelling.  Gastrointestinal:  Negative for abdominal pain, heartburn, nausea and vomiting.  Genitourinary: Negative.   Musculoskeletal:  Negative for joint pain and myalgias.  Skin:  Negative for rash.  Neurological:  Negative for dizziness and weakness.  Endo/Heme/Allergies: Negative.    Objective:   Vitals:   12/12/22 4098  BP: 120/66  Pulse: 86  Temp: 98 F (36.7 C)  TempSrc: Oral  SpO2: 99%  Weight: 134 lb (60.8 kg)  Height: 5\' 2"  (1.575 m)   Physical Exam Constitutional:      General: She is not  in acute distress.    Appearance: She is not ill-appearing.  HENT:     Head: Normocephalic and atraumatic.  Eyes:     General: No scleral icterus.    Conjunctiva/sclera: Conjunctivae normal.  Cardiovascular:     Rate and Rhythm: Normal rate and regular rhythm.     Pulses: Normal pulses.     Heart sounds: Normal heart sounds. No murmur heard. Pulmonary:     Effort: Pulmonary effort is normal.     Breath sounds: No wheezing, rhonchi or rales.  Musculoskeletal:     Right lower leg: No edema.     Left lower leg: No edema.  Skin:    General: Skin is warm and dry.  Neurological:     General: No focal deficit present.     Mental Status: She is alert.    CBC    Component Value Date/Time   WBC 13.6 (H) 09/12/2022 1115   WBC 6.9 01/14/2022 2220   RBC 3.76 (L) 09/12/2022 1115   HGB 12.0 09/12/2022 1115   HCT 36.7 09/12/2022 1115   PLT 257 09/12/2022 1115   MCV 97.6 09/12/2022 1115   MCH 31.9 09/12/2022 1115   MCHC 32.7 09/12/2022 1115   RDW 15.2 09/12/2022 1115   LYMPHSABS 0.6 (L) 09/12/2022 1115   MONOABS 0.4 09/12/2022 1115   EOSABS 0.0 09/12/2022 1115   BASOSABS 0.0 09/12/2022 1115      Latest Ref Rng & Units 09/12/2022   11:15 AM 06/15/2022   11:13 AM 06/06/2022    2:12 PM  BMP  Glucose 70 - 99 mg/dL 841  324  401   BUN 8 - 23 mg/dL 25  18  23    Creatinine 0.44 - 1.00 mg/dL 0.27  2.53  6.64   Sodium 135 - 145 mmol/L 139  136  138   Potassium 3.5 - 5.1 mmol/L 3.7  3.4  3.0   Chloride 98 - 111 mmol/L 103  99  98   CO2 22 - 32 mmol/L 27  29  29    Calcium 8.9 - 10.3 mg/dL 9.0  8.7  8.6     Chest imaging: CT Chest 06/07/22 Mediastinum/Nodes: No supraclavicular adenopathy. Right axillary node dissection. No mediastinal or hilar adenopathy. Tiny hiatal hernia.   Lungs/Pleura: Tiny right pleural effusion is slightly decreased. Persistent areas of mild loculation posteromedially on 69/2 and anteriorly on 93/2.   Mild centrilobular emphysema.   Status post right lower  lobectomy.   Calcified granuloma along the right minor fissure.   Evolution and progression of geographic airspace and ground-glass opacity within the medial right upper and right middle lobes, favored to be radiation induced. For example, there are new areas of anterior ground-glass and septal thickening including on 48/7. Progressive posterior right upper lobe consolidation with air bronchograms including on 51/7, at the site of ground-glass and nodular consolidation on the prior. New posterolateral right apical nodular consolidation including on 31/7. No convincing findings of pulmonary recurrent or metastatic disease.  CT Chest 05/03/22 Mediastinum/Nodes: No mediastinal lymphadenopathy. There is no hilar lymphadenopathy. The esophagus has normal imaging features. There is no axillary lymphadenopathy.   Lungs/Pleura: Volume loss in the right hemithorax is compatible with right lower lobectomy. There is new interlobular septal  thickening with a moderate sized patchy ground-glass opacity in the posterior right upper lung (see image 42/7). Along the medial aspect of this finding is a 1.8 x 2.0 cm irregular confluent nodular opacity.   Just inferior to this moderate area of ground-glass opacity is another 3.0 x 2.8 cm nodular consolidative lesion (59/7).   A third new nodular consolidative focus is seen in the anterior right lung on image 66/7 measuring 2.1 x 1.1 cm.   Fissural thickening noted in the area of fissural nodularity noted in the right lung previously. Tiny right pleural effusion is new in the interval with loculated anterior and paraspinal components.   Tiny peripheral left lower lobe nodule on 105/7 is unchanged. No suspicious pulmonary nodule or mass in the left lung.  CT Chest 01/31/22 1. Previously demonstrated perifissural and subpleural nodularity posteriorly in the right hemithorax has not progressed and may be slightly improved compared with the prior CT.  Continued follow-up recommended. 2. Previously demonstrated mildly hypermetabolic right paratracheal lymph node has decreased in size. 3. No evidence of local recurrence or progressive metastatic disease. 4.  Aortic Atherosclerosis  PFT:    Latest Ref Rng & Units 07/25/2018    9:21 AM  PFT Results  FVC-Pre L 2.54   FVC-Predicted Pre % 98   FVC-Post L 2.64   FVC-Predicted Post % 102   Pre FEV1/FVC % % 78   Post FEV1/FCV % % 75   FEV1-Pre L 1.97   FEV1-Predicted Pre % 102   FEV1-Post L 1.98   DLCO uncorrected ml/min/mmHg 15.20   DLCO UNC% % 83   DLCO corrected ml/min/mmHg 15.44   DLCO COR %Predicted % 84   DLVA Predicted % 91   TLC L 5.19   TLC % Predicted % 105   RV % Predicted % 115     Labs:  Path:  Echo: LV EF 60-65%. RV systolic size and function is normal. Mild elevated PASP . LA mildly dilated. Moderate mitral valve regurgitation.   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 Other differential includes radiation induced pneumonitis. Less likely infectious at this time.   She was started on 40mg  prednisone 6/5 and tapered off on 10/5.   She has had return of cough over recent week. She is on antibiotic course from her primary care team.   She is to use duoneb treatments 2-3 times per day.   Follow up in 3 months.  Melody Comas, MD [facility] Office: [phone]   Current Outpatient Medications:     acetaminophen (TYLENOL) 500 MG tablet, Take 1 tablet (500 mg total) by mouth every 6 (six) hours as needed for mild pain. (Patient taking differently: Take 1,000 mg by mouth every 6 (six) hours as needed for mild pain (pain score 1-3).), Disp: 30 tablet, Rfl: 0   aspirin EC 81 MG tablet, Take 1 tablet (81 mg total) by mouth daily. Swallow whole., Disp: 90 tablet, Rfl: 3   azelastine (ASTELIN) 0.1 % nasal spray, Place 1 spray into both  nostrils daily as needed (Congestion). Use in each nostril as directed, Disp: , Rfl:    celecoxib (CELEBREX) 200 MG capsule, Take 200 mg by mouth 2 (two) times daily., Disp: , Rfl:    cetirizine (ZYRTEC) 10 MG tablet, Take 10 mg by mouth daily as needed for allergies., Disp: , Rfl:    diphenoxylate-atropine (LOMOTIL) 2.5-0.025 MG tablet, Take 1 tablet by mouth. As directed, Disp: , Rfl:    doxycycline (VIBRA-TABS) 100 MG tablet, Take 100 mg by mouth 2 (two) times daily., Disp: , Rfl:    ezetimibe (ZETIA) 10 MG tablet, Take 10 mg by mouth daily., Disp: , Rfl:    hydrochlorothiazide (MICROZIDE) 12.5 MG capsule, Take 1 capsule (12.5 mg total) by mouth daily., Disp: 45 capsule, Rfl: 3   ipratropium-albuterol (DUONEB) 0.5-2.5 (3) MG/3ML SOLN, Take 3 mLs by nebulization every 6 (six) hours as needed., Disp: 360 mL, Rfl: 6   lidocaine-prilocaine (EMLA) cream, Apply 1 Application topically as needed., Disp: 30 g, Rfl: 2   LORazepam (ATIVAN) 1 MG tablet, Take 1-2 mg by mouth at bedtime as needed for sleep., Disp: , Rfl:    losartan (COZAAR) 25 MG tablet, Take 25 mg by mouth daily., Disp: , Rfl:    methimazole (TAPAZOLE) 5 MG tablet, Take 5 mg by mouth daily., Disp: , Rfl:    oxyCODONE (ROXICODONE) 5 MG immediate release tablet, Take 1 tablet (5 mg total) by mouth every 6 (six) hours as needed for severe pain., Disp: 7 tablet, Rfl: 0   Polyethyl Glycol-Propyl Glycol (SYSTANE OP), Place 1 drop into both eyes 2 (two) times daily as needed (dry eyes)., Disp: , Rfl:  No current  facility-administered medications for this visit.  Facility-Administered Medications Ordered in Other Visits:    sodium chloride flush (NS) 0.9 % injection 10 mL, 10 mL, Intravenous, PRN, Si Gaul, MD, 10 mL at 01/25/22 1534

## 2022-12-12 NOTE — Patient Instructions (Signed)
We will follow up your CT Chest next month  Use duoneb nebulizer treatments 2-3 times per day  Complete course of antibiotics from primary care team  Follow up in 3 months

## 2022-12-29 DIAGNOSIS — E0590 Thyrotoxicosis, unspecified without thyrotoxic crisis or storm: Secondary | ICD-10-CM | POA: Diagnosis not present

## 2023-01-03 DIAGNOSIS — J702 Acute drug-induced interstitial lung disorders: Secondary | ICD-10-CM | POA: Diagnosis not present

## 2023-01-04 ENCOUNTER — Ambulatory Visit (HOSPITAL_COMMUNITY)
Admission: RE | Admit: 2023-01-04 | Discharge: 2023-01-04 | Disposition: A | Discharge status: Home / Self Care | Payer: Medicare PPO | Source: Ambulatory Visit | Attending: Internal Medicine | Admitting: Internal Medicine

## 2023-01-04 ENCOUNTER — Inpatient Hospital Stay: Payer: Medicare PPO | Attending: Internal Medicine

## 2023-01-04 DIAGNOSIS — Z452 Encounter for adjustment and management of vascular access device: Secondary | ICD-10-CM | POA: Insufficient documentation

## 2023-01-04 DIAGNOSIS — C3431 Malignant neoplasm of lower lobe, right bronchus or lung: Secondary | ICD-10-CM | POA: Insufficient documentation

## 2023-01-04 DIAGNOSIS — C3490 Malignant neoplasm of unspecified part of unspecified bronchus or lung: Secondary | ICD-10-CM

## 2023-01-04 DIAGNOSIS — Z923 Personal history of irradiation: Secondary | ICD-10-CM | POA: Diagnosis not present

## 2023-01-04 DIAGNOSIS — Z902 Acquired absence of lung [part of]: Secondary | ICD-10-CM | POA: Insufficient documentation

## 2023-01-04 DIAGNOSIS — J984 Other disorders of lung: Secondary | ICD-10-CM | POA: Diagnosis not present

## 2023-01-04 DIAGNOSIS — I700 Atherosclerosis of aorta: Secondary | ICD-10-CM | POA: Diagnosis not present

## 2023-01-04 DIAGNOSIS — Z9221 Personal history of antineoplastic chemotherapy: Secondary | ICD-10-CM | POA: Insufficient documentation

## 2023-01-04 DIAGNOSIS — J90 Pleural effusion, not elsewhere classified: Secondary | ICD-10-CM | POA: Diagnosis not present

## 2023-01-04 LAB — CBC WITH DIFFERENTIAL (CANCER CENTER ONLY)
Abs Immature Granulocytes: 0.02 K/uL (ref 0.00–0.07)
Basophils Absolute: 0 K/uL (ref 0.0–0.1)
Basophils Relative: 1 %
Eosinophils Absolute: 0.1 K/uL (ref 0.0–0.5)
Eosinophils Relative: 1 %
HCT: 39.5 % (ref 36.0–46.0)
Hemoglobin: 12.6 g/dL (ref 12.0–15.0)
Immature Granulocytes: 0 %
Lymphocytes Relative: 18 %
Lymphs Abs: 1.6 K/uL (ref 0.7–4.0)
MCH: 29.2 pg (ref 26.0–34.0)
MCHC: 31.9 g/dL (ref 30.0–36.0)
MCV: 91.4 fL (ref 80.0–100.0)
Monocytes Absolute: 0.7 K/uL (ref 0.1–1.0)
Monocytes Relative: 8 %
Neutro Abs: 6.2 K/uL (ref 1.7–7.7)
Neutrophils Relative %: 72 %
Platelet Count: 346 K/uL (ref 150–400)
RBC: 4.32 MIL/uL (ref 3.87–5.11)
RDW: 13.6 % (ref 11.5–15.5)
WBC Count: 8.7 K/uL (ref 4.0–10.5)
nRBC: 0 % (ref 0.0–0.2)

## 2023-01-04 LAB — CMP (CANCER CENTER ONLY)
ALT: 8 U/L (ref 0–44)
AST: 14 U/L — ABNORMAL LOW (ref 15–41)
Albumin: 4.1 g/dL (ref 3.5–5.0)
Alkaline Phosphatase: 80 U/L (ref 38–126)
Anion gap: 8 (ref 5–15)
BUN: 26 mg/dL — ABNORMAL HIGH (ref 8–23)
CO2: 30 mmol/L (ref 22–32)
Calcium: 9.7 mg/dL (ref 8.9–10.3)
Chloride: 103 mmol/L (ref 98–111)
Creatinine: 1.32 mg/dL — ABNORMAL HIGH (ref 0.44–1.00)
GFR, Estimated: 40 mL/min — ABNORMAL LOW (ref >60)
Glucose, Bld: 103 mg/dL — ABNORMAL HIGH (ref 70–99)
Potassium: 4.4 mmol/L (ref 3.5–5.1)
Sodium: 141 mmol/L (ref 135–145)
Total Bilirubin: 0.7 mg/dL (ref <1.2)
Total Protein: 7.7 g/dL (ref 6.5–8.1)

## 2023-01-04 MED ORDER — HEPARIN SOD (PORK) LOCK FLUSH 100 UNIT/ML IV SOLN
INTRAVENOUS | Status: AC
Start: 2023-01-04 — End: ?
  Filled 2023-01-04: qty 5

## 2023-01-04 MED ORDER — HEPARIN SOD (PORK) LOCK FLUSH 100 UNIT/ML IV SOLN
500.0000 [IU] | Freq: Once | INTRAVENOUS | Status: AC
  Administered 2023-01-04: 500 [IU] via INTRAVENOUS

## 2023-01-04 MED ORDER — IOHEXOL 300 MG/ML  SOLN
60.0000 mL | Freq: Once | INTRAMUSCULAR | Status: AC | PRN
  Administered 2023-01-04: 60 mL via INTRAVENOUS

## 2023-01-05 DIAGNOSIS — E0590 Thyrotoxicosis, unspecified without thyrotoxic crisis or storm: Secondary | ICD-10-CM | POA: Diagnosis not present

## 2023-01-05 DIAGNOSIS — H04123 Dry eye syndrome of bilateral lacrimal glands: Secondary | ICD-10-CM | POA: Diagnosis not present

## 2023-01-10 ENCOUNTER — Inpatient Hospital Stay: Payer: Medicare PPO | Admitting: Internal Medicine

## 2023-01-10 VITALS — BP 125/68 | HR 82 | Temp 97.8°F | Resp 16 | Ht 62.0 in | Wt 130.4 lb

## 2023-01-10 DIAGNOSIS — Z902 Acquired absence of lung [part of]: Secondary | ICD-10-CM | POA: Diagnosis not present

## 2023-01-10 DIAGNOSIS — C3431 Malignant neoplasm of lower lobe, right bronchus or lung: Secondary | ICD-10-CM | POA: Diagnosis not present

## 2023-01-10 DIAGNOSIS — Z9221 Personal history of antineoplastic chemotherapy: Secondary | ICD-10-CM | POA: Diagnosis not present

## 2023-01-10 DIAGNOSIS — C3490 Malignant neoplasm of unspecified part of unspecified bronchus or lung: Secondary | ICD-10-CM | POA: Diagnosis not present

## 2023-01-10 DIAGNOSIS — Z452 Encounter for adjustment and management of vascular access device: Secondary | ICD-10-CM | POA: Diagnosis not present

## 2023-01-10 DIAGNOSIS — Z923 Personal history of irradiation: Secondary | ICD-10-CM | POA: Diagnosis not present

## 2023-01-10 NOTE — Progress Notes (Signed)
[facility] [phone]   [phone]  OFFICE PROGRESS NOTE  Lupita Raider, MD [address] [place] [address]  DIAGNOSIS: Recurrent non-small cell lung cancer presented as a stage IIIa (T1c, N2, M0) non-small cell lung cancer, adenocarcinoma initially diagnosed as T1a (T1c, N0, M0) non-small cell lung cancer, adenocarcinoma presented with right lower lobe lung nodule in June 2020.  PRIOR THERAPY:  1) Right video-assisted thoracoscopy, Wedge resection right lower lobe nodule, Thoracoscopic right lower lobectomy, Lymph node dissection on July 26, 2018 under the care of Dr. Dorris Fetch. 2) Concurrent chemoradiation with weekly carboplatin for AUC of 2 and paclitaxel 45 Mg/M2.  First dose November 22, 2021.  Status post 6 cycles. 3) Consolidation treatment with immunotherapy with Imfinzi 1500 Mg IV every 4 weeks.  First dose February 09, 2022.  Status post 3 cycles.  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  CURRENT THERAPY: Observation.   INTERVAL HISTORY: Vanessa Day 82 y.o. female returns to the clinic today for follow-up visit accompanied by her husband. Discussed the use of AI scribe software for clinical note transcription with the patient, who gave verbal consent to proceed.  History of Present Illness   The patient, an 82 year old with a history of non-small cell lung cancer, initially diagnosed as stage 1A in June 2020, underwent a right lower lobe resection. However, the disease recurred as stage 3 in October 2023, necessitating chemotherapy, radiation, and three cycles of immunotherapy with Imfinzi. The immunotherapy was discontinued in April due to suspected pneumonitis, presenting as breathing issues.  In the interim, the patient was managed with prednisone for a prolonged period, which was eventually tapered off. However, during the tapering process, the patient experienced a  two-week period of persistent coughing. Currently, the patient denies being on prednisone and reports an overall satisfactory health status since the last visit four months ago.  The patient continues to experience occasional coughing, predominantly dry, and occasional wheezing. She also reports pain in the area of the previous surgical intervention, which she initially attributed to possible fluid accumulation based on a previous CT scan. However, the pain is now understood to be a residual effect of the surgery.  The patient reports mild shortness of breath but denies any nausea, vomiting, or significant weight changes. She also reports persistent dizziness, which is exacerbated when looking down. Despite consultations with eye and ear specialists, the cause of the dizziness remains undetermined. The patient was informed by an ear specialist that hearing loss may have occurred due to chemotherapy, but this was later clarified as a misunderstanding.      MEDICAL HISTORY: Past Medical History:  Diagnosis Date   Adenocarcinoma of lung, stage 1, right (HCC) 2020   Anxiety    Arthritis    back, feet, jaw - per patient "all over"   Breast cancer (HCC) 2000   Right breast   Family history of breast cancer    GERD (gastroesophageal reflux disease)    Graves disease    H/O hiatal hernia    Hyperlipidemia    Hypertension    IBS (irritable bowel syndrome)    Mitral regurgitation    moderate mitral regurgitation 08/20/20 echo   Neck pain    PAF (paroxysmal atrial fibrillation) (HCC)    ~ 09/2020 in setting of hyperthyroidism   PONV (postoperative nausea and vomiting)    Pre-diabetes     ALLERGIES:  is allergic to hydrocodone, amitriptyline hcl, angiotensin  receptor blockers, codeine, dicyclomine hcl, hyoscyamine, nitrofurantoin, statins, tramadol, oxycodone-acetaminophen, pseudoephedrine, and sulfa antibiotics.  MEDICATIONS:  Current Outpatient Medications  Medication Sig Dispense Refill    acetaminophen (TYLENOL) 500 MG tablet Take 1 tablet (500 mg total) by mouth every 6 (six) hours as needed for mild pain. (Patient taking differently: Take 1,000 mg by mouth every 6 (six) hours as needed for mild pain (pain score 1-3).) 30 tablet 0   aspirin EC 81 MG tablet Take 1 tablet (81 mg total) by mouth daily. Swallow whole. 90 tablet 3   azelastine (ASTELIN) 0.1 % nasal spray Place 1 spray into both nostrils daily as needed (Congestion). Use in each nostril as directed     celecoxib (CELEBREX) 200 MG capsule Take 200 mg by mouth 2 (two) times daily.     cetirizine (ZYRTEC) 10 MG tablet Take 10 mg by mouth daily as needed for allergies.     diphenoxylate-atropine (LOMOTIL) 2.5-0.025 MG tablet Take 1 tablet by mouth. As directed     doxycycline (VIBRA-TABS) 100 MG tablet Take 100 mg by mouth 2 (two) times daily.     ezetimibe (ZETIA) 10 MG tablet Take 10 mg by mouth daily.     hydrochlorothiazide (MICROZIDE) 12.5 MG capsule Take 1 capsule (12.5 mg total) by mouth daily. 45 capsule 3   ipratropium-albuterol (DUONEB) 0.5-2.5 (3) MG/3ML SOLN Take 3 mLs by nebulization every 6 (six) hours as needed. 360 mL 6   lidocaine-prilocaine (EMLA) cream Apply 1 Application topically as needed. 30 g 2   LORazepam (ATIVAN) 1 MG tablet Take 1-2 mg by mouth at bedtime as needed for sleep.     losartan (COZAAR) 25 MG tablet Take 25 mg by mouth daily.     methimazole (TAPAZOLE) 5 MG tablet Take 5 mg by mouth daily.     oxyCODONE (ROXICODONE) 5 MG immediate release tablet Take 1 tablet (5 mg total) by mouth every 6 (six) hours as needed for severe pain. 7 tablet 0   Polyethyl Glycol-Propyl Glycol (SYSTANE OP) Place 1 drop into both eyes 2 (two) times daily as needed (dry eyes).     No current facility-administered medications for this visit.   Facility-Administered Medications Ordered in Other Visits  Medication Dose Route Frequency Provider Last Rate Last Admin   sodium chloride flush (NS) 0.9 % injection 10  mL  10 mL Intravenous PRN Si Gaul, MD   10 mL at 01/25/22 1534    SURGICAL HISTORY:  Past Surgical History:  Procedure Laterality Date   ABDOMINAL HYSTERECTOMY     APPENDECTOMY     BACK SURGERY     BREAST BIOPSY     BREAST SURGERY     CARPOMETACARPEL SUSPENSION PLASTY Left 08/25/2015   Procedure: SUSPENSIONPLASTY LEFT THUMB TRAPEZIUM EXCISION ABDUCTOR POLLICIS LONGUS TRANSFER;  Surgeon: Cindee Salt, MD;  Location: [facility];  Service: Orthopedics;  Laterality: Left;   CHOLECYSTECTOMY     COLONOSCOPY     several   EYE SURGERY     cataract right and left eye   GAS INSERTION  06/23/2011   Procedure: INSERTION OF GAS;  Surgeon: Sherrie George, MD;  Location: [facility] OR;  Service: Ophthalmology;  Laterality: Left;  SF6   IR IMAGING GUIDED PORT INSERTION  12/14/2021   LOBECTOMY Right 07/26/2018   Procedure: RIGHT LOWER LOBE LOBECTOMY;  Surgeon: Loreli Slot, MD;  Location: [facility] OR;  Service: Thoracic;  Laterality: Right;   MASTECTOMY     right breast   SCLERAL  BUCKLE  06/23/2011   Procedure: SCLERAL BUCKLE;  Surgeon: Sherrie George, MD;  Location: [facility] OR;  Service: Ophthalmology;  Laterality: Left;   SEGMENTECOMY Right 07/26/2018   Procedure: SEGMENTECTOMY;  Surgeon: Loreli Slot, MD;  Location: [facility] OR;  Service: Thoracic;  Laterality: Right;   TONSILLECTOMY     VIDEO ASSISTED THORACOSCOPY Right 07/26/2018   Procedure: VIDEO ASSISTED THORACOSCOPY;  Surgeon: Loreli Slot, MD;  Location: [facility] OR;  Service: Thoracic;  Laterality: Right;   VIDEO BRONCHOSCOPY WITH ENDOBRONCHIAL ULTRASOUND N/A 10/27/2021   Procedure: VIDEO BRONCHOSCOPY WITH ENDOBRONCHIAL ULTRASOUND;  Surgeon: Loreli Slot, MD;  Location: [facility] OR;  Service: Thoracic;  Laterality: N/A;    REVIEW OF SYSTEMS:  Constitutional: positive for fatigue Eyes: negative Ears, nose, mouth, throat, and face: negative Respiratory: positive for cough Cardiovascular:  negative Gastrointestinal: negative Genitourinary:negative Integument/breast: negative Hematologic/lymphatic: negative Musculoskeletal:negative Neurological: positive for dizziness Behavioral/Psych: negative Endocrine: negative Allergic/Immunologic: negative   PHYSICAL EXAMINATION: General appearance: alert, cooperative, and no distress Head: Normocephalic, without obvious abnormality, atraumatic Neck: no adenopathy, no JVD, supple, symmetrical, trachea midline, and thyroid not enlarged, symmetric, no tenderness/mass/nodules Lymph nodes: Cervical, supraclavicular, and axillary nodes normal. Resp: clear to auscultation bilaterally Back: symmetric, no curvature. ROM normal. No CVA tenderness. Cardio: regular rate and rhythm, S1, S2 normal, no murmur, click, rub or gallop GI: soft, non-tender; bowel sounds normal; no masses,  no organomegaly Extremities: extremities normal, atraumatic, no cyanosis or edema Neurologic: Alert and oriented X 3, normal strength and tone. Normal symmetric reflexes. Normal coordination and gait  ECOG PERFORMANCE STATUS: 1 - Symptomatic but completely ambulatory   Blood pressure 125/68, pulse 82, temperature 97.8 F (36.6 C), temperature source Temporal, resp. rate 16, height 5\' 2"  (1.575 m), weight 130 lb 6.4 oz (59.1 kg), SpO2 98%.  LABORATORY DATA: Lab Results  Component Value Date   WBC 8.7 01/04/2023   HGB 12.6 01/04/2023   HCT 39.5 01/04/2023   MCV 91.4 01/04/2023   PLT 346 01/04/2023      Chemistry      Component Value Date/Time   NA 141 01/04/2023 1024   K 4.4 01/04/2023 1024   CL 103 01/04/2023 1024   CO2 30 01/04/2023 1024   BUN 26 (H) 01/04/2023 1024   CREATININE 1.32 (H) 01/04/2023 1024      Component Value Date/Time   CALCIUM 9.7 01/04/2023 1024   ALKPHOS 80 01/04/2023 1024   AST 14 (L) 01/04/2023 1024   ALT 8 01/04/2023 1024   BILITOT 0.7 01/04/2023 1024       RADIOGRAPHIC STUDIES: No results found.   ASSESSMENT AND  PLAN: This is a very pleasant 82 years old white female with stage IIIA (T1c, N2, M0) non-small cell lung cancer, adenocarcinoma initially diagnosed as diagnosed with a stage IA (T1c, N0, M0) non-small cell lung cancer, adenocarcinoma status post right lower lobectomy with lymph node dissection in July 2020.  She had disease recurrence in October 2023. The patient is currently on observation and she is feeling fine except for the dry cough. Her CT scan of the chest followed by PET scan showed increasing nodularity of the pleural surface in the right chest with ill-defined areas in the cardiophrenic recess and increasing size of lymph nodes in the mediastinum. The patient underwent bronchoscopy with EBUS under the care of Dr. 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 The patient started a course of concurrent chemoradiation with weekly carboplatin for AUC of  2 and paclitaxel 45 Mg/M2 status post 7 cycles.  She tolerated this treatment well except for the radiation-induced esophagitis as well as fatigue. Her scan showed improvement of her disease especially in the mediastinal lymph nodes. The patient underwent consolidation treatment with immunotherapy with Imfinzi 1500 Mg IV every 4 weeks status post 3 cycles.  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  The patient was treated with a tapered dose of prednisone as well as 2 weeks course of doxycycline with no improvement in her symptoms.  She is now on a long-term treatment with prednisone and currently on 20 mg p.o. daily by her pulmonologist. The patient is currently on observation. She had repeat CT scan of the chest performed recently.  The final report is still pending.  I personally and independently reviewed the scan images and discussed the result with the patient and her husband.  I did not see any evidence for disease progression but I will wait for  the final report for confirmation.    Recurrent Non-Small Cell Lung Cancer (NSCLC) Recurrent NSCLC, stage III, initially diagnosed as stage IA in June 2020 with right lower lobe resection. Treated with chemotherapy, radiation, and three cycles of Imfinzi until April 2024 due to suspected pneumonitis. Current status: no significant changes in recent CT scan images compared to August 2024. Reports occasional dry cough, intermittent wheezing, and mild dyspnea. No significant weight loss. Post-surgical pain in the area of previous surgery. Discussed disease stability and monitoring plan with periodic CT scans. If stable, follow-up in four months, potentially extending to six months thereafter. - Review CT scan report once available - Schedule follow-up visit in four months - Perform CT scan one week before the next visit - Contact patient if any concerning findings on the CT report  Dizziness Chronic dizziness, exacerbated by looking down. Evaluated by ophthalmology and otolaryngology with no definitive cause identified. Possible hearing loss attributed to chemotherapy, though the dose received was not typically ototoxic. Reassured regarding chemotherapy dose and hearing loss correlation. - Monitor symptoms - Reassure regarding chemotherapy dose and hearing loss correlation  General Health Maintenance General health maintenance discussed briefly. No new recommendations provided. - Encourage regular follow-ups and reporting of new symptoms.   The patient was advised to call immediately if she has any other concerning symptoms in the interval. The patient voices understanding of current disease status and treatment options and is in agreement with the current care plan. All questions were answered. The patient knows to call the clinic with any problems, questions or concerns. We can certainly see the patient much sooner if necessary. The total time spent in the appointment was 30  minutes.  Disclaimer: This note was dictated with voice recognition software. Similar sounding words can inadvertently be transcribed and may not be corrected upon review.

## 2023-01-12 DIAGNOSIS — E1122 Type 2 diabetes mellitus with diabetic chronic kidney disease: Secondary | ICD-10-CM | POA: Diagnosis not present

## 2023-01-12 DIAGNOSIS — E782 Mixed hyperlipidemia: Secondary | ICD-10-CM | POA: Diagnosis not present

## 2023-01-12 DIAGNOSIS — C3491 Malignant neoplasm of unspecified part of right bronchus or lung: Secondary | ICD-10-CM | POA: Diagnosis not present

## 2023-01-12 DIAGNOSIS — G620 Drug-induced polyneuropathy: Secondary | ICD-10-CM | POA: Diagnosis not present

## 2023-01-12 DIAGNOSIS — G4700 Insomnia, unspecified: Secondary | ICD-10-CM | POA: Diagnosis not present

## 2023-01-12 DIAGNOSIS — E0500 Thyrotoxicosis with diffuse goiter without thyrotoxic crisis or storm: Secondary | ICD-10-CM | POA: Diagnosis not present

## 2023-01-12 DIAGNOSIS — N1831 Chronic kidney disease, stage 3a: Secondary | ICD-10-CM | POA: Diagnosis not present

## 2023-01-12 DIAGNOSIS — M4800 Spinal stenosis, site unspecified: Secondary | ICD-10-CM | POA: Diagnosis not present

## 2023-01-12 DIAGNOSIS — I10 Essential (primary) hypertension: Secondary | ICD-10-CM | POA: Diagnosis not present

## 2023-01-17 ENCOUNTER — Inpatient Hospital Stay: Payer: Medicare PPO

## 2023-01-17 VITALS — BP 128/77 | HR 81 | Temp 98.5°F | Resp 16

## 2023-01-17 DIAGNOSIS — Z923 Personal history of irradiation: Secondary | ICD-10-CM | POA: Diagnosis not present

## 2023-01-17 DIAGNOSIS — C3431 Malignant neoplasm of lower lobe, right bronchus or lung: Secondary | ICD-10-CM

## 2023-01-17 DIAGNOSIS — Z452 Encounter for adjustment and management of vascular access device: Secondary | ICD-10-CM | POA: Diagnosis not present

## 2023-01-17 DIAGNOSIS — Z95828 Presence of other vascular implants and grafts: Secondary | ICD-10-CM

## 2023-01-17 DIAGNOSIS — Z902 Acquired absence of lung [part of]: Secondary | ICD-10-CM | POA: Diagnosis not present

## 2023-01-17 DIAGNOSIS — Z9221 Personal history of antineoplastic chemotherapy: Secondary | ICD-10-CM | POA: Diagnosis not present

## 2023-01-17 DIAGNOSIS — C3491 Malignant neoplasm of unspecified part of right bronchus or lung: Secondary | ICD-10-CM

## 2023-01-17 MED ORDER — SODIUM CHLORIDE 0.9% FLUSH
10.0000 mL | Freq: Once | INTRAVENOUS | Status: AC
  Administered 2023-01-17: 10 mL

## 2023-01-17 MED ORDER — HEPARIN SOD (PORK) LOCK FLUSH 100 UNIT/ML IV SOLN
500.0000 [IU] | Freq: Once | INTRAVENOUS | Status: AC
  Administered 2023-01-17: 500 [IU]

## 2023-01-23 ENCOUNTER — Telehealth: Payer: Self-pay | Admitting: Medical Oncology

## 2023-01-23 NOTE — Telephone Encounter (Signed)
Request to remove port due to $50 cost every 6 weeks to get it flushed. She said she gets charged $50 for port flushes. So far 2 x on  oct 2nd and nov. She said her insurance said it may have  been coded wrong. The payment gets sent to [place] .Message sent to [employer] .

## 2023-02-03 ENCOUNTER — Other Ambulatory Visit: Payer: Self-pay | Admitting: Pulmonary Disease

## 2023-02-03 DIAGNOSIS — J702 Acute drug-induced interstitial lung disorders: Secondary | ICD-10-CM | POA: Diagnosis not present

## 2023-02-03 DIAGNOSIS — T50905A Adverse effect of unspecified drugs, medicaments and biological substances, initial encounter: Secondary | ICD-10-CM

## 2023-02-24 DIAGNOSIS — M48061 Spinal stenosis, lumbar region without neurogenic claudication: Secondary | ICD-10-CM | POA: Diagnosis not present

## 2023-02-24 DIAGNOSIS — C3491 Malignant neoplasm of unspecified part of right bronchus or lung: Secondary | ICD-10-CM | POA: Diagnosis not present

## 2023-02-24 DIAGNOSIS — M25519 Pain in unspecified shoulder: Secondary | ICD-10-CM | POA: Diagnosis not present

## 2023-02-27 ENCOUNTER — Telehealth: Payer: Self-pay | Admitting: Internal Medicine

## 2023-02-27 ENCOUNTER — Telehealth: Payer: Self-pay | Admitting: *Deleted

## 2023-02-27 DIAGNOSIS — Z95828 Presence of other vascular implants and grafts: Secondary | ICD-10-CM

## 2023-02-27 DIAGNOSIS — C3491 Malignant neoplasm of unspecified part of right bronchus or lung: Secondary | ICD-10-CM

## 2023-02-27 NOTE — Telephone Encounter (Signed)
Patient called wanting to have her port removed. She is no longer in treatment and can't afford the port flush co-pays every 8 weeks. Her insurance does not cover the full facility charge.  Order entered to have port removed. Sent message to have scheduled.

## 2023-02-27 NOTE — Telephone Encounter (Signed)
Received message patient need to reschedule appt on 2/5 this week. Called patient; patient stated unable to make $60 payment every 6-8 weeks. Patient request removal of port. Elease Etienne was secure chat; for patient request. Advise patient will forward request.

## 2023-03-01 ENCOUNTER — Inpatient Hospital Stay: Payer: Medicare PPO

## 2023-03-06 DIAGNOSIS — J702 Acute drug-induced interstitial lung disorders: Secondary | ICD-10-CM | POA: Diagnosis not present

## 2023-03-09 ENCOUNTER — Encounter (HOSPITAL_COMMUNITY): Payer: Self-pay

## 2023-03-09 ENCOUNTER — Other Ambulatory Visit (HOSPITAL_COMMUNITY): Payer: Medicare PPO

## 2023-03-15 DIAGNOSIS — M25512 Pain in left shoulder: Secondary | ICD-10-CM | POA: Diagnosis not present

## 2023-03-16 ENCOUNTER — Ambulatory Visit: Payer: Medicare PPO | Admitting: Pulmonary Disease

## 2023-03-20 ENCOUNTER — Ambulatory Visit (HOSPITAL_COMMUNITY)
Admission: RE | Admit: 2023-03-20 | Discharge: 2023-03-20 | Disposition: A | Discharge status: Home / Self Care | Payer: Medicare PPO | Source: Ambulatory Visit | Attending: Internal Medicine | Admitting: Internal Medicine

## 2023-03-20 DIAGNOSIS — C3491 Malignant neoplasm of unspecified part of right bronchus or lung: Secondary | ICD-10-CM | POA: Insufficient documentation

## 2023-03-20 DIAGNOSIS — Z452 Encounter for adjustment and management of vascular access device: Secondary | ICD-10-CM | POA: Diagnosis not present

## 2023-03-20 DIAGNOSIS — Z95828 Presence of other vascular implants and grafts: Secondary | ICD-10-CM

## 2023-03-20 HISTORY — PX: IR REMOVAL TUN ACCESS W/ PORT W/O FL MOD SED: IMG2290

## 2023-03-20 MED ORDER — LIDOCAINE HCL 1 % IJ SOLN
20.0000 mL | Freq: Once | INTRAMUSCULAR | Status: AC
  Administered 2023-03-20: 10 mL via INTRADERMAL

## 2023-03-20 MED ORDER — LIDOCAINE-EPINEPHRINE 1 %-1:100000 IJ SOLN
INTRAMUSCULAR | Status: AC
  Filled 2023-03-20: qty 1

## 2023-03-20 MED ORDER — LIDOCAINE HCL 1 % IJ SOLN
INTRAMUSCULAR | Status: AC
  Filled 2023-03-20: qty 20

## 2023-03-20 MED ORDER — LIDOCAINE-EPINEPHRINE 1 %-1:100000 IJ SOLN: 20.0000 mL | Freq: Once | INTRAMUSCULAR | Status: DC

## 2023-03-20 NOTE — Procedures (Signed)
 Vascular and Interventional Radiology Procedure Note  Patient: Vanessa Day DOB: 03/22/1940 Medical Record Number: [id number] Note Date/Time: 03/20/23 9:11 AM   Performing Physician: Roanna Banning, MD Assistant(s): None  Diagnosis: Lung cancer. Rx completion  Procedure: PORT REMOVAL  Anesthesia: Local Anesthetic Complications: None Estimated Blood Loss: Minimal Specimens:  None  Findings:  Successful removal of a right-sided venous port. Primary incision closure. Dermabond at skin.  See detailed procedure note with images in PACS. The patient tolerated the procedure well without incident or complication and was returned to [facility] in stable condition.    Roanna Banning, MD [facility]   Pager. [phone] Clinic. [phone]

## 2023-03-29 DIAGNOSIS — M48061 Spinal stenosis, lumbar region without neurogenic claudication: Secondary | ICD-10-CM | POA: Diagnosis not present

## 2023-03-29 DIAGNOSIS — C3491 Malignant neoplasm of unspecified part of right bronchus or lung: Secondary | ICD-10-CM | POA: Diagnosis not present

## 2023-03-29 DIAGNOSIS — M130 Polyarthritis, unspecified: Secondary | ICD-10-CM | POA: Diagnosis not present

## 2023-03-31 ENCOUNTER — Telehealth: Payer: Self-pay | Admitting: Medical Oncology

## 2023-03-31 NOTE — Telephone Encounter (Signed)
 Pt does not want any scan or f/u.   " I don't want to deal with lung cancer anymore. I am tired , have arthritis and chronic sciatica. Dr. Clelia Croft prescribed new meds for her pain  and dizziness prn.Bonita Quin did ask if she "started to have pain in her bones " would Dr Arbutus Ped consider radiation for me to palliate pain"?.  I told her to let us know if she is concerned and she can call us for an appt.   Appts cancelled.

## 2023-04-03 DIAGNOSIS — J702 Acute drug-induced interstitial lung disorders: Secondary | ICD-10-CM | POA: Diagnosis not present

## 2023-04-11 DIAGNOSIS — E0590 Thyrotoxicosis, unspecified without thyrotoxic crisis or storm: Secondary | ICD-10-CM | POA: Diagnosis not present

## 2023-04-26 DIAGNOSIS — M25512 Pain in left shoulder: Secondary | ICD-10-CM | POA: Diagnosis not present

## 2023-05-02 DIAGNOSIS — H04123 Dry eye syndrome of bilateral lacrimal glands: Secondary | ICD-10-CM | POA: Diagnosis not present

## 2023-05-04 DIAGNOSIS — J702 Acute drug-induced interstitial lung disorders: Secondary | ICD-10-CM | POA: Diagnosis not present

## 2023-05-11 DIAGNOSIS — E785 Hyperlipidemia, unspecified: Secondary | ICD-10-CM | POA: Diagnosis not present

## 2023-05-11 DIAGNOSIS — J849 Interstitial pulmonary disease, unspecified: Secondary | ICD-10-CM | POA: Diagnosis not present

## 2023-05-11 DIAGNOSIS — F411 Generalized anxiety disorder: Secondary | ICD-10-CM | POA: Diagnosis not present

## 2023-05-11 DIAGNOSIS — G319 Degenerative disease of nervous system, unspecified: Secondary | ICD-10-CM | POA: Diagnosis not present

## 2023-05-11 DIAGNOSIS — G620 Drug-induced polyneuropathy: Secondary | ICD-10-CM | POA: Diagnosis not present

## 2023-05-11 DIAGNOSIS — J439 Emphysema, unspecified: Secondary | ICD-10-CM | POA: Diagnosis not present

## 2023-05-11 DIAGNOSIS — F324 Major depressive disorder, single episode, in partial remission: Secondary | ICD-10-CM | POA: Diagnosis not present

## 2023-05-11 DIAGNOSIS — N1831 Chronic kidney disease, stage 3a: Secondary | ICD-10-CM | POA: Diagnosis not present

## 2023-05-11 DIAGNOSIS — R42 Dizziness and giddiness: Secondary | ICD-10-CM | POA: Diagnosis not present

## 2023-05-11 DIAGNOSIS — Z809 Family history of malignant neoplasm, unspecified: Secondary | ICD-10-CM | POA: Diagnosis not present

## 2023-05-11 DIAGNOSIS — M1990 Unspecified osteoarthritis, unspecified site: Secondary | ICD-10-CM | POA: Diagnosis not present

## 2023-05-11 DIAGNOSIS — I129 Hypertensive chronic kidney disease with stage 1 through stage 4 chronic kidney disease, or unspecified chronic kidney disease: Secondary | ICD-10-CM | POA: Diagnosis not present

## 2023-05-11 DIAGNOSIS — J302 Other seasonal allergic rhinitis: Secondary | ICD-10-CM | POA: Diagnosis not present

## 2023-05-11 DIAGNOSIS — M5440 Lumbago with sciatica, unspecified side: Secondary | ICD-10-CM | POA: Diagnosis not present

## 2023-05-11 DIAGNOSIS — M4800 Spinal stenosis, site unspecified: Secondary | ICD-10-CM | POA: Diagnosis not present

## 2023-05-11 DIAGNOSIS — M109 Gout, unspecified: Secondary | ICD-10-CM | POA: Diagnosis not present

## 2023-05-11 DIAGNOSIS — I700 Atherosclerosis of aorta: Secondary | ICD-10-CM | POA: Diagnosis not present

## 2023-05-11 DIAGNOSIS — K219 Gastro-esophageal reflux disease without esophagitis: Secondary | ICD-10-CM | POA: Diagnosis not present

## 2023-05-19 DIAGNOSIS — E538 Deficiency of other specified B group vitamins: Secondary | ICD-10-CM | POA: Diagnosis not present

## 2023-05-19 DIAGNOSIS — E1122 Type 2 diabetes mellitus with diabetic chronic kidney disease: Secondary | ICD-10-CM | POA: Diagnosis not present

## 2023-05-19 DIAGNOSIS — F3341 Major depressive disorder, recurrent, in partial remission: Secondary | ICD-10-CM | POA: Diagnosis not present

## 2023-05-19 DIAGNOSIS — E782 Mixed hyperlipidemia: Secondary | ICD-10-CM | POA: Diagnosis not present

## 2023-05-19 DIAGNOSIS — Z1331 Encounter for screening for depression: Secondary | ICD-10-CM | POA: Diagnosis not present

## 2023-05-19 DIAGNOSIS — G629 Polyneuropathy, unspecified: Secondary | ICD-10-CM | POA: Diagnosis not present

## 2023-05-19 DIAGNOSIS — Z0000 Encounter for general adult medical examination without abnormal findings: Secondary | ICD-10-CM | POA: Diagnosis not present

## 2023-05-19 DIAGNOSIS — I480 Paroxysmal atrial fibrillation: Secondary | ICD-10-CM | POA: Diagnosis not present

## 2023-05-19 DIAGNOSIS — C3491 Malignant neoplasm of unspecified part of right bronchus or lung: Secondary | ICD-10-CM | POA: Diagnosis not present

## 2023-05-19 DIAGNOSIS — I10 Essential (primary) hypertension: Secondary | ICD-10-CM | POA: Diagnosis not present

## 2023-05-22 ENCOUNTER — Other Ambulatory Visit: Payer: Self-pay | Admitting: Pulmonary Disease

## 2023-05-22 DIAGNOSIS — J984 Other disorders of lung: Secondary | ICD-10-CM

## 2023-05-22 NOTE — Telephone Encounter (Signed)
 spoke w/ tech at pharmacy they stopped it as an offer to refill and will let PT know that it not can be refill

## 2023-05-29 DIAGNOSIS — Z79891 Long term (current) use of opiate analgesic: Secondary | ICD-10-CM | POA: Diagnosis not present

## 2023-05-29 DIAGNOSIS — M48061 Spinal stenosis, lumbar region without neurogenic claudication: Secondary | ICD-10-CM | POA: Diagnosis not present

## 2023-05-29 DIAGNOSIS — G629 Polyneuropathy, unspecified: Secondary | ICD-10-CM | POA: Diagnosis not present

## 2023-05-29 DIAGNOSIS — G894 Chronic pain syndrome: Secondary | ICD-10-CM | POA: Diagnosis not present

## 2023-05-29 DIAGNOSIS — M47816 Spondylosis without myelopathy or radiculopathy, lumbar region: Secondary | ICD-10-CM | POA: Diagnosis not present

## 2023-05-31 DIAGNOSIS — Z79891 Long term (current) use of opiate analgesic: Secondary | ICD-10-CM | POA: Diagnosis not present

## 2023-05-31 DIAGNOSIS — M47816 Spondylosis without myelopathy or radiculopathy, lumbar region: Secondary | ICD-10-CM | POA: Diagnosis not present

## 2023-05-31 DIAGNOSIS — M48061 Spinal stenosis, lumbar region without neurogenic claudication: Secondary | ICD-10-CM | POA: Diagnosis not present

## 2023-06-03 DIAGNOSIS — J702 Acute drug-induced interstitial lung disorders: Secondary | ICD-10-CM | POA: Diagnosis not present

## 2023-06-07 DIAGNOSIS — M48061 Spinal stenosis, lumbar region without neurogenic claudication: Secondary | ICD-10-CM | POA: Diagnosis not present

## 2023-06-07 DIAGNOSIS — M6281 Muscle weakness (generalized): Secondary | ICD-10-CM | POA: Diagnosis not present

## 2023-06-07 DIAGNOSIS — R2689 Other abnormalities of gait and mobility: Secondary | ICD-10-CM | POA: Diagnosis not present

## 2023-06-07 DIAGNOSIS — M47816 Spondylosis without myelopathy or radiculopathy, lumbar region: Secondary | ICD-10-CM | POA: Diagnosis not present

## 2023-06-26 DIAGNOSIS — G894 Chronic pain syndrome: Secondary | ICD-10-CM | POA: Diagnosis not present

## 2023-06-26 DIAGNOSIS — M48061 Spinal stenosis, lumbar region without neurogenic claudication: Secondary | ICD-10-CM | POA: Diagnosis not present

## 2023-06-26 DIAGNOSIS — G629 Polyneuropathy, unspecified: Secondary | ICD-10-CM | POA: Diagnosis not present

## 2023-06-26 DIAGNOSIS — M47816 Spondylosis without myelopathy or radiculopathy, lumbar region: Secondary | ICD-10-CM | POA: Diagnosis not present

## 2023-07-04 DIAGNOSIS — M47816 Spondylosis without myelopathy or radiculopathy, lumbar region: Secondary | ICD-10-CM | POA: Diagnosis not present

## 2023-07-04 DIAGNOSIS — M6281 Muscle weakness (generalized): Secondary | ICD-10-CM | POA: Diagnosis not present

## 2023-07-04 DIAGNOSIS — J702 Acute drug-induced interstitial lung disorders: Secondary | ICD-10-CM | POA: Diagnosis not present

## 2023-07-04 DIAGNOSIS — R2689 Other abnormalities of gait and mobility: Secondary | ICD-10-CM | POA: Diagnosis not present

## 2023-07-04 DIAGNOSIS — M48061 Spinal stenosis, lumbar region without neurogenic claudication: Secondary | ICD-10-CM | POA: Diagnosis not present

## 2023-07-05 DIAGNOSIS — E0590 Thyrotoxicosis, unspecified without thyrotoxic crisis or storm: Secondary | ICD-10-CM | POA: Diagnosis not present

## 2023-07-06 DIAGNOSIS — M47816 Spondylosis without myelopathy or radiculopathy, lumbar region: Secondary | ICD-10-CM | POA: Diagnosis not present

## 2023-07-06 DIAGNOSIS — M48061 Spinal stenosis, lumbar region without neurogenic claudication: Secondary | ICD-10-CM | POA: Diagnosis not present

## 2023-07-06 DIAGNOSIS — R2689 Other abnormalities of gait and mobility: Secondary | ICD-10-CM | POA: Diagnosis not present

## 2023-07-06 DIAGNOSIS — M6281 Muscle weakness (generalized): Secondary | ICD-10-CM | POA: Diagnosis not present

## 2023-07-12 DIAGNOSIS — M47816 Spondylosis without myelopathy or radiculopathy, lumbar region: Secondary | ICD-10-CM | POA: Diagnosis not present

## 2023-07-12 DIAGNOSIS — R2689 Other abnormalities of gait and mobility: Secondary | ICD-10-CM | POA: Diagnosis not present

## 2023-07-12 DIAGNOSIS — M6281 Muscle weakness (generalized): Secondary | ICD-10-CM | POA: Diagnosis not present

## 2023-07-12 DIAGNOSIS — M48061 Spinal stenosis, lumbar region without neurogenic claudication: Secondary | ICD-10-CM | POA: Diagnosis not present

## 2023-07-12 DIAGNOSIS — E0590 Thyrotoxicosis, unspecified without thyrotoxic crisis or storm: Secondary | ICD-10-CM | POA: Diagnosis not present

## 2023-07-14 DIAGNOSIS — M6281 Muscle weakness (generalized): Secondary | ICD-10-CM | POA: Diagnosis not present

## 2023-07-14 DIAGNOSIS — M47816 Spondylosis without myelopathy or radiculopathy, lumbar region: Secondary | ICD-10-CM | POA: Diagnosis not present

## 2023-07-14 DIAGNOSIS — M48061 Spinal stenosis, lumbar region without neurogenic claudication: Secondary | ICD-10-CM | POA: Diagnosis not present

## 2023-07-14 DIAGNOSIS — R2689 Other abnormalities of gait and mobility: Secondary | ICD-10-CM | POA: Diagnosis not present

## 2023-07-18 DIAGNOSIS — M6281 Muscle weakness (generalized): Secondary | ICD-10-CM | POA: Diagnosis not present

## 2023-07-18 DIAGNOSIS — R2689 Other abnormalities of gait and mobility: Secondary | ICD-10-CM | POA: Diagnosis not present

## 2023-07-18 DIAGNOSIS — M48061 Spinal stenosis, lumbar region without neurogenic claudication: Secondary | ICD-10-CM | POA: Diagnosis not present

## 2023-07-18 DIAGNOSIS — M47816 Spondylosis without myelopathy or radiculopathy, lumbar region: Secondary | ICD-10-CM | POA: Diagnosis not present

## 2023-07-20 DIAGNOSIS — M6281 Muscle weakness (generalized): Secondary | ICD-10-CM | POA: Diagnosis not present

## 2023-07-20 DIAGNOSIS — M48061 Spinal stenosis, lumbar region without neurogenic claudication: Secondary | ICD-10-CM | POA: Diagnosis not present

## 2023-07-20 DIAGNOSIS — R2689 Other abnormalities of gait and mobility: Secondary | ICD-10-CM | POA: Diagnosis not present

## 2023-07-20 DIAGNOSIS — M47816 Spondylosis without myelopathy or radiculopathy, lumbar region: Secondary | ICD-10-CM | POA: Diagnosis not present

## 2023-07-25 DIAGNOSIS — R2689 Other abnormalities of gait and mobility: Secondary | ICD-10-CM | POA: Diagnosis not present

## 2023-07-25 DIAGNOSIS — G629 Polyneuropathy, unspecified: Secondary | ICD-10-CM | POA: Diagnosis not present

## 2023-07-25 DIAGNOSIS — G894 Chronic pain syndrome: Secondary | ICD-10-CM | POA: Diagnosis not present

## 2023-07-25 DIAGNOSIS — M48061 Spinal stenosis, lumbar region without neurogenic claudication: Secondary | ICD-10-CM | POA: Diagnosis not present

## 2023-07-25 DIAGNOSIS — M47816 Spondylosis without myelopathy or radiculopathy, lumbar region: Secondary | ICD-10-CM | POA: Diagnosis not present

## 2023-07-25 DIAGNOSIS — M6281 Muscle weakness (generalized): Secondary | ICD-10-CM | POA: Diagnosis not present

## 2023-07-27 DIAGNOSIS — M47816 Spondylosis without myelopathy or radiculopathy, lumbar region: Secondary | ICD-10-CM | POA: Diagnosis not present

## 2023-07-27 DIAGNOSIS — M48061 Spinal stenosis, lumbar region without neurogenic claudication: Secondary | ICD-10-CM | POA: Diagnosis not present

## 2023-07-27 DIAGNOSIS — R2689 Other abnormalities of gait and mobility: Secondary | ICD-10-CM | POA: Diagnosis not present

## 2023-07-27 DIAGNOSIS — M6281 Muscle weakness (generalized): Secondary | ICD-10-CM | POA: Diagnosis not present

## 2023-08-01 DIAGNOSIS — M6281 Muscle weakness (generalized): Secondary | ICD-10-CM | POA: Diagnosis not present

## 2023-08-01 DIAGNOSIS — M48061 Spinal stenosis, lumbar region without neurogenic claudication: Secondary | ICD-10-CM | POA: Diagnosis not present

## 2023-08-01 DIAGNOSIS — M47816 Spondylosis without myelopathy or radiculopathy, lumbar region: Secondary | ICD-10-CM | POA: Diagnosis not present

## 2023-08-01 DIAGNOSIS — R2689 Other abnormalities of gait and mobility: Secondary | ICD-10-CM | POA: Diagnosis not present

## 2023-08-02 DIAGNOSIS — M6281 Muscle weakness (generalized): Secondary | ICD-10-CM | POA: Diagnosis not present

## 2023-08-02 DIAGNOSIS — M47816 Spondylosis without myelopathy or radiculopathy, lumbar region: Secondary | ICD-10-CM | POA: Diagnosis not present

## 2023-08-02 DIAGNOSIS — M48061 Spinal stenosis, lumbar region without neurogenic claudication: Secondary | ICD-10-CM | POA: Diagnosis not present

## 2023-08-02 DIAGNOSIS — R2689 Other abnormalities of gait and mobility: Secondary | ICD-10-CM | POA: Diagnosis not present

## 2023-08-08 DIAGNOSIS — M48061 Spinal stenosis, lumbar region without neurogenic claudication: Secondary | ICD-10-CM | POA: Diagnosis not present

## 2023-08-08 DIAGNOSIS — M47816 Spondylosis without myelopathy or radiculopathy, lumbar region: Secondary | ICD-10-CM | POA: Diagnosis not present

## 2023-08-08 DIAGNOSIS — R2689 Other abnormalities of gait and mobility: Secondary | ICD-10-CM | POA: Diagnosis not present

## 2023-08-08 DIAGNOSIS — M6281 Muscle weakness (generalized): Secondary | ICD-10-CM | POA: Diagnosis not present

## 2023-08-10 DIAGNOSIS — R2689 Other abnormalities of gait and mobility: Secondary | ICD-10-CM | POA: Diagnosis not present

## 2023-08-10 DIAGNOSIS — M47816 Spondylosis without myelopathy or radiculopathy, lumbar region: Secondary | ICD-10-CM | POA: Diagnosis not present

## 2023-08-10 DIAGNOSIS — M48061 Spinal stenosis, lumbar region without neurogenic claudication: Secondary | ICD-10-CM | POA: Diagnosis not present

## 2023-08-10 DIAGNOSIS — M6281 Muscle weakness (generalized): Secondary | ICD-10-CM | POA: Diagnosis not present

## 2023-08-15 DIAGNOSIS — M6281 Muscle weakness (generalized): Secondary | ICD-10-CM | POA: Diagnosis not present

## 2023-08-15 DIAGNOSIS — R2689 Other abnormalities of gait and mobility: Secondary | ICD-10-CM | POA: Diagnosis not present

## 2023-08-15 DIAGNOSIS — M47816 Spondylosis without myelopathy or radiculopathy, lumbar region: Secondary | ICD-10-CM | POA: Diagnosis not present

## 2023-08-15 DIAGNOSIS — M48061 Spinal stenosis, lumbar region without neurogenic claudication: Secondary | ICD-10-CM | POA: Diagnosis not present

## 2023-08-17 DIAGNOSIS — M6281 Muscle weakness (generalized): Secondary | ICD-10-CM | POA: Diagnosis not present

## 2023-08-17 DIAGNOSIS — M47816 Spondylosis without myelopathy or radiculopathy, lumbar region: Secondary | ICD-10-CM | POA: Diagnosis not present

## 2023-08-17 DIAGNOSIS — M48061 Spinal stenosis, lumbar region without neurogenic claudication: Secondary | ICD-10-CM | POA: Diagnosis not present

## 2023-08-17 DIAGNOSIS — R2689 Other abnormalities of gait and mobility: Secondary | ICD-10-CM | POA: Diagnosis not present

## 2023-08-18 DIAGNOSIS — C3491 Malignant neoplasm of unspecified part of right bronchus or lung: Secondary | ICD-10-CM | POA: Diagnosis not present

## 2023-08-18 DIAGNOSIS — M48061 Spinal stenosis, lumbar region without neurogenic claudication: Secondary | ICD-10-CM | POA: Diagnosis not present

## 2023-08-18 DIAGNOSIS — E1122 Type 2 diabetes mellitus with diabetic chronic kidney disease: Secondary | ICD-10-CM | POA: Diagnosis not present

## 2023-08-18 DIAGNOSIS — N1831 Chronic kidney disease, stage 3a: Secondary | ICD-10-CM | POA: Diagnosis not present

## 2023-08-22 DIAGNOSIS — G629 Polyneuropathy, unspecified: Secondary | ICD-10-CM | POA: Diagnosis not present

## 2023-08-22 DIAGNOSIS — G894 Chronic pain syndrome: Secondary | ICD-10-CM | POA: Diagnosis not present

## 2023-08-22 DIAGNOSIS — M48061 Spinal stenosis, lumbar region without neurogenic claudication: Secondary | ICD-10-CM | POA: Diagnosis not present

## 2023-08-22 DIAGNOSIS — M47816 Spondylosis without myelopathy or radiculopathy, lumbar region: Secondary | ICD-10-CM | POA: Diagnosis not present

## 2023-08-22 DIAGNOSIS — M6281 Muscle weakness (generalized): Secondary | ICD-10-CM | POA: Diagnosis not present

## 2023-08-22 DIAGNOSIS — R2689 Other abnormalities of gait and mobility: Secondary | ICD-10-CM | POA: Diagnosis not present

## 2023-08-24 DIAGNOSIS — M48061 Spinal stenosis, lumbar region without neurogenic claudication: Secondary | ICD-10-CM | POA: Diagnosis not present

## 2023-08-24 DIAGNOSIS — M6281 Muscle weakness (generalized): Secondary | ICD-10-CM | POA: Diagnosis not present

## 2023-08-24 DIAGNOSIS — M47816 Spondylosis without myelopathy or radiculopathy, lumbar region: Secondary | ICD-10-CM | POA: Diagnosis not present

## 2023-08-24 DIAGNOSIS — R2689 Other abnormalities of gait and mobility: Secondary | ICD-10-CM | POA: Diagnosis not present

## 2023-08-29 DIAGNOSIS — M48061 Spinal stenosis, lumbar region without neurogenic claudication: Secondary | ICD-10-CM | POA: Diagnosis not present

## 2023-08-29 DIAGNOSIS — M47816 Spondylosis without myelopathy or radiculopathy, lumbar region: Secondary | ICD-10-CM | POA: Diagnosis not present

## 2023-08-29 DIAGNOSIS — R2689 Other abnormalities of gait and mobility: Secondary | ICD-10-CM | POA: Diagnosis not present

## 2023-08-29 DIAGNOSIS — M6281 Muscle weakness (generalized): Secondary | ICD-10-CM | POA: Diagnosis not present

## 2023-08-31 DIAGNOSIS — M47816 Spondylosis without myelopathy or radiculopathy, lumbar region: Secondary | ICD-10-CM | POA: Diagnosis not present

## 2023-08-31 DIAGNOSIS — M6281 Muscle weakness (generalized): Secondary | ICD-10-CM | POA: Diagnosis not present

## 2023-08-31 DIAGNOSIS — R2689 Other abnormalities of gait and mobility: Secondary | ICD-10-CM | POA: Diagnosis not present

## 2023-08-31 DIAGNOSIS — M48061 Spinal stenosis, lumbar region without neurogenic claudication: Secondary | ICD-10-CM | POA: Diagnosis not present

## 2023-09-05 DIAGNOSIS — M47816 Spondylosis without myelopathy or radiculopathy, lumbar region: Secondary | ICD-10-CM | POA: Diagnosis not present

## 2023-09-05 DIAGNOSIS — R2689 Other abnormalities of gait and mobility: Secondary | ICD-10-CM | POA: Diagnosis not present

## 2023-09-05 DIAGNOSIS — M48061 Spinal stenosis, lumbar region without neurogenic claudication: Secondary | ICD-10-CM | POA: Diagnosis not present

## 2023-09-05 DIAGNOSIS — M6281 Muscle weakness (generalized): Secondary | ICD-10-CM | POA: Diagnosis not present

## 2023-09-07 DIAGNOSIS — M6281 Muscle weakness (generalized): Secondary | ICD-10-CM | POA: Diagnosis not present

## 2023-09-07 DIAGNOSIS — R2689 Other abnormalities of gait and mobility: Secondary | ICD-10-CM | POA: Diagnosis not present

## 2023-09-07 DIAGNOSIS — M47816 Spondylosis without myelopathy or radiculopathy, lumbar region: Secondary | ICD-10-CM | POA: Diagnosis not present

## 2023-09-07 DIAGNOSIS — M48061 Spinal stenosis, lumbar region without neurogenic claudication: Secondary | ICD-10-CM | POA: Diagnosis not present

## 2023-09-12 DIAGNOSIS — M6281 Muscle weakness (generalized): Secondary | ICD-10-CM | POA: Diagnosis not present

## 2023-09-12 DIAGNOSIS — R2689 Other abnormalities of gait and mobility: Secondary | ICD-10-CM | POA: Diagnosis not present

## 2023-09-12 DIAGNOSIS — M48061 Spinal stenosis, lumbar region without neurogenic claudication: Secondary | ICD-10-CM | POA: Diagnosis not present

## 2023-09-12 DIAGNOSIS — M47816 Spondylosis without myelopathy or radiculopathy, lumbar region: Secondary | ICD-10-CM | POA: Diagnosis not present

## 2023-09-14 DIAGNOSIS — R2689 Other abnormalities of gait and mobility: Secondary | ICD-10-CM | POA: Diagnosis not present

## 2023-09-14 DIAGNOSIS — M6281 Muscle weakness (generalized): Secondary | ICD-10-CM | POA: Diagnosis not present

## 2023-09-14 DIAGNOSIS — M48061 Spinal stenosis, lumbar region without neurogenic claudication: Secondary | ICD-10-CM | POA: Diagnosis not present

## 2023-09-14 DIAGNOSIS — M47816 Spondylosis without myelopathy or radiculopathy, lumbar region: Secondary | ICD-10-CM | POA: Diagnosis not present

## 2023-09-19 DIAGNOSIS — M47816 Spondylosis without myelopathy or radiculopathy, lumbar region: Secondary | ICD-10-CM | POA: Diagnosis not present

## 2023-09-19 DIAGNOSIS — M48061 Spinal stenosis, lumbar region without neurogenic claudication: Secondary | ICD-10-CM | POA: Diagnosis not present

## 2023-09-19 DIAGNOSIS — M6281 Muscle weakness (generalized): Secondary | ICD-10-CM | POA: Diagnosis not present

## 2023-09-19 DIAGNOSIS — R2689 Other abnormalities of gait and mobility: Secondary | ICD-10-CM | POA: Diagnosis not present

## 2023-09-21 DIAGNOSIS — G629 Polyneuropathy, unspecified: Secondary | ICD-10-CM | POA: Diagnosis not present

## 2023-09-21 DIAGNOSIS — G894 Chronic pain syndrome: Secondary | ICD-10-CM | POA: Diagnosis not present

## 2023-09-21 DIAGNOSIS — M47816 Spondylosis without myelopathy or radiculopathy, lumbar region: Secondary | ICD-10-CM | POA: Diagnosis not present

## 2023-09-21 DIAGNOSIS — M48061 Spinal stenosis, lumbar region without neurogenic claudication: Secondary | ICD-10-CM | POA: Diagnosis not present

## 2023-09-21 DIAGNOSIS — R2689 Other abnormalities of gait and mobility: Secondary | ICD-10-CM | POA: Diagnosis not present

## 2023-09-21 DIAGNOSIS — M6281 Muscle weakness (generalized): Secondary | ICD-10-CM | POA: Diagnosis not present

## 2023-09-26 DIAGNOSIS — R2689 Other abnormalities of gait and mobility: Secondary | ICD-10-CM | POA: Diagnosis not present

## 2023-09-26 DIAGNOSIS — M6281 Muscle weakness (generalized): Secondary | ICD-10-CM | POA: Diagnosis not present

## 2023-09-26 DIAGNOSIS — M48061 Spinal stenosis, lumbar region without neurogenic claudication: Secondary | ICD-10-CM | POA: Diagnosis not present

## 2023-09-26 DIAGNOSIS — M47816 Spondylosis without myelopathy or radiculopathy, lumbar region: Secondary | ICD-10-CM | POA: Diagnosis not present

## 2023-09-28 DIAGNOSIS — M47816 Spondylosis without myelopathy or radiculopathy, lumbar region: Secondary | ICD-10-CM | POA: Diagnosis not present

## 2023-09-28 DIAGNOSIS — M48061 Spinal stenosis, lumbar region without neurogenic claudication: Secondary | ICD-10-CM | POA: Diagnosis not present

## 2023-09-28 DIAGNOSIS — R2689 Other abnormalities of gait and mobility: Secondary | ICD-10-CM | POA: Diagnosis not present

## 2023-09-28 DIAGNOSIS — M6281 Muscle weakness (generalized): Secondary | ICD-10-CM | POA: Diagnosis not present

## 2023-10-05 DIAGNOSIS — M47816 Spondylosis without myelopathy or radiculopathy, lumbar region: Secondary | ICD-10-CM | POA: Diagnosis not present

## 2023-10-05 DIAGNOSIS — M6281 Muscle weakness (generalized): Secondary | ICD-10-CM | POA: Diagnosis not present

## 2023-10-05 DIAGNOSIS — R2689 Other abnormalities of gait and mobility: Secondary | ICD-10-CM | POA: Diagnosis not present

## 2023-10-05 DIAGNOSIS — M48061 Spinal stenosis, lumbar region without neurogenic claudication: Secondary | ICD-10-CM | POA: Diagnosis not present

## 2023-10-30 DIAGNOSIS — M109 Gout, unspecified: Secondary | ICD-10-CM | POA: Diagnosis not present

## 2023-10-30 DIAGNOSIS — I10 Essential (primary) hypertension: Secondary | ICD-10-CM | POA: Diagnosis not present

## 2023-11-09 DIAGNOSIS — H04123 Dry eye syndrome of bilateral lacrimal glands: Secondary | ICD-10-CM | POA: Diagnosis not present

## 2023-11-09 DIAGNOSIS — H524 Presbyopia: Secondary | ICD-10-CM | POA: Diagnosis not present

## 2023-11-15 DIAGNOSIS — C3491 Malignant neoplasm of unspecified part of right bronchus or lung: Secondary | ICD-10-CM | POA: Diagnosis not present

## 2023-11-15 DIAGNOSIS — M48061 Spinal stenosis, lumbar region without neurogenic claudication: Secondary | ICD-10-CM | POA: Diagnosis not present

## 2023-11-15 DIAGNOSIS — E0500 Thyrotoxicosis with diffuse goiter without thyrotoxic crisis or storm: Secondary | ICD-10-CM | POA: Diagnosis not present

## 2023-11-15 DIAGNOSIS — E538 Deficiency of other specified B group vitamins: Secondary | ICD-10-CM | POA: Diagnosis not present

## 2023-11-15 DIAGNOSIS — M109 Gout, unspecified: Secondary | ICD-10-CM | POA: Diagnosis not present

## 2023-11-15 DIAGNOSIS — N1831 Chronic kidney disease, stage 3a: Secondary | ICD-10-CM | POA: Diagnosis not present

## 2023-11-15 DIAGNOSIS — R42 Dizziness and giddiness: Secondary | ICD-10-CM | POA: Diagnosis not present

## 2023-11-15 DIAGNOSIS — I10 Essential (primary) hypertension: Secondary | ICD-10-CM | POA: Diagnosis not present

## 2023-11-15 DIAGNOSIS — E1122 Type 2 diabetes mellitus with diabetic chronic kidney disease: Secondary | ICD-10-CM | POA: Diagnosis not present

## 2023-11-16 DIAGNOSIS — Z79891 Long term (current) use of opiate analgesic: Secondary | ICD-10-CM | POA: Diagnosis not present

## 2023-11-16 DIAGNOSIS — G629 Polyneuropathy, unspecified: Secondary | ICD-10-CM | POA: Diagnosis not present

## 2023-11-16 DIAGNOSIS — M47816 Spondylosis without myelopathy or radiculopathy, lumbar region: Secondary | ICD-10-CM | POA: Diagnosis not present

## 2023-11-16 DIAGNOSIS — M48061 Spinal stenosis, lumbar region without neurogenic claudication: Secondary | ICD-10-CM | POA: Diagnosis not present

## 2023-11-16 DIAGNOSIS — G894 Chronic pain syndrome: Secondary | ICD-10-CM | POA: Diagnosis not present

## 2023-12-13 DIAGNOSIS — M47816 Spondylosis without myelopathy or radiculopathy, lumbar region: Secondary | ICD-10-CM | POA: Diagnosis not present

## 2023-12-13 DIAGNOSIS — M48061 Spinal stenosis, lumbar region without neurogenic claudication: Secondary | ICD-10-CM | POA: Diagnosis not present

## 2023-12-13 DIAGNOSIS — G894 Chronic pain syndrome: Secondary | ICD-10-CM | POA: Diagnosis not present

## 2023-12-13 DIAGNOSIS — G629 Polyneuropathy, unspecified: Secondary | ICD-10-CM | POA: Diagnosis not present

## 2023-12-14 DIAGNOSIS — I10 Essential (primary) hypertension: Secondary | ICD-10-CM | POA: Diagnosis not present

## 2023-12-14 DIAGNOSIS — D6869 Other thrombophilia: Secondary | ICD-10-CM | POA: Diagnosis not present

## 2023-12-14 DIAGNOSIS — I4891 Unspecified atrial fibrillation: Secondary | ICD-10-CM | POA: Diagnosis not present

## 2023-12-14 DIAGNOSIS — R0789 Other chest pain: Secondary | ICD-10-CM | POA: Diagnosis not present

## 2023-12-14 DIAGNOSIS — M159 Polyosteoarthritis, unspecified: Secondary | ICD-10-CM | POA: Diagnosis not present

## 2023-12-14 DIAGNOSIS — C3491 Malignant neoplasm of unspecified part of right bronchus or lung: Secondary | ICD-10-CM | POA: Diagnosis not present

## 2023-12-14 DIAGNOSIS — M549 Dorsalgia, unspecified: Secondary | ICD-10-CM | POA: Diagnosis not present

## 2024-01-26 ENCOUNTER — Encounter: Payer: Self-pay | Admitting: Internal Medicine

## 2024-01-26 ENCOUNTER — Other Ambulatory Visit (HOSPITAL_COMMUNITY): Payer: Self-pay

## 2024-01-29 ENCOUNTER — Other Ambulatory Visit (HOSPITAL_COMMUNITY): Payer: Self-pay

## 2024-01-29 MED ORDER — CELECOXIB 200 MG PO CAPS: 200.0000 mg | ORAL_CAPSULE | Freq: Every day | ORAL | 0 refills | Status: AC

## 2024-01-29 MED ORDER — PREGABALIN 50 MG PO CAPS: 50.0000 mg | ORAL_CAPSULE | Freq: Two times a day (BID) | ORAL | 1 refills | Status: AC

## 2024-01-29 MED ORDER — AZELASTINE HCL 0.1 % NA SOLN: 1.0000 | Freq: Two times a day (BID) | NASAL | 1 refills | Status: AC | PRN

## 2024-01-30 ENCOUNTER — Other Ambulatory Visit (HOSPITAL_COMMUNITY): Payer: Self-pay

## 2024-02-07 ENCOUNTER — Other Ambulatory Visit: Payer: Self-pay

## 2024-02-07 ENCOUNTER — Other Ambulatory Visit (HOSPITAL_COMMUNITY): Payer: Self-pay

## 2024-02-07 MED ORDER — NUCYNTA ER 50 MG PO TB12
ORAL_TABLET | ORAL | 0 refills | Status: AC
  Filled 2024-02-07 – 2024-02-12 (×2): qty 90, 30d supply, fill #0

## 2024-02-07 MED ORDER — DULOXETINE HCL 30 MG PO CPEP
30.0000 mg | ORAL_CAPSULE | Freq: Every day | ORAL | 1 refills | Status: AC
  Filled 2024-02-07: qty 30, 30d supply, fill #0

## 2024-02-09 ENCOUNTER — Other Ambulatory Visit (HOSPITAL_COMMUNITY): Payer: Self-pay

## 2024-02-12 ENCOUNTER — Other Ambulatory Visit (HOSPITAL_COMMUNITY): Payer: Self-pay

## 2024-02-21 ENCOUNTER — Ambulatory Visit
Admission: RE | Admit: 2024-02-21 | Discharge: 2024-02-21 | Disposition: A | Source: Ambulatory Visit | Attending: Family Medicine | Admitting: Family Medicine

## 2024-02-21 ENCOUNTER — Encounter: Payer: Self-pay | Admitting: Family Medicine

## 2024-02-21 ENCOUNTER — Other Ambulatory Visit (HOSPITAL_COMMUNITY): Payer: Self-pay

## 2024-02-21 ENCOUNTER — Other Ambulatory Visit: Payer: Self-pay

## 2024-02-21 ENCOUNTER — Other Ambulatory Visit: Payer: Self-pay | Admitting: Family Medicine

## 2024-02-21 DIAGNOSIS — R270 Ataxia, unspecified: Secondary | ICD-10-CM

## 2024-02-21 DIAGNOSIS — R42 Dizziness and giddiness: Secondary | ICD-10-CM

## 2024-02-21 MED ORDER — LORAZEPAM 2 MG PO TABS
2.0000 mg | ORAL_TABLET | Freq: Every evening | ORAL | 2 refills | Status: AC | PRN
  Filled 2024-02-21: qty 40, 20d supply, fill #0

## 2024-02-21 MED ORDER — AZELASTINE HCL 0.1 % NA SOLN
1.0000 | Freq: Two times a day (BID) | NASAL | 11 refills | Status: AC | PRN
  Filled 2024-02-21: qty 30, 30d supply, fill #0

## 2024-02-21 MED ORDER — IOPAMIDOL (ISOVUE-300) INJECTION 61%
75.0000 mL | Freq: Once | INTRAVENOUS | Status: AC | PRN
  Administered 2024-02-21: 75 mL via INTRAVENOUS

## 2024-02-21 MED ORDER — METHYLPHENIDATE HCL 5 MG PO TABS
5.0000 mg | ORAL_TABLET | Freq: Two times a day (BID) | ORAL | 0 refills | Status: AC | PRN
  Filled 2024-02-21: qty 60, 30d supply, fill #0
